# Patient Record
Sex: Male | Born: 1980 | Race: Black or African American | Hispanic: No | Marital: Single | Smoking: Current every day smoker
Health system: Southern US, Community
[De-identification: ages and names within clinical notes are randomized; demographics above are authoritative.]

## PROBLEM LIST (undated history)

## (undated) DIAGNOSIS — F32A Depression, unspecified: Secondary | ICD-10-CM

## (undated) DIAGNOSIS — F329 Major depressive disorder, single episode, unspecified: Secondary | ICD-10-CM

## (undated) DIAGNOSIS — S069XAA Unspecified intracranial injury with loss of consciousness status unknown, initial encounter: Secondary | ICD-10-CM

## (undated) DIAGNOSIS — R569 Unspecified convulsions: Secondary | ICD-10-CM

## (undated) DIAGNOSIS — F319 Bipolar disorder, unspecified: Secondary | ICD-10-CM

## (undated) DIAGNOSIS — S069X9A Unspecified intracranial injury with loss of consciousness of unspecified duration, initial encounter: Secondary | ICD-10-CM

## (undated) HISTORY — DX: Unspecified convulsions: R56.9

---

## 2013-08-11 ENCOUNTER — Encounter (HOSPITAL_COMMUNITY): Payer: Self-pay | Admitting: Radiology

## 2013-08-11 ENCOUNTER — Emergency Department (HOSPITAL_COMMUNITY): Payer: Medicaid Other

## 2013-08-11 ENCOUNTER — Inpatient Hospital Stay (HOSPITAL_COMMUNITY)
Admission: EM | Admit: 2013-08-11 | Discharge: 2013-09-13 | DRG: 003 | Disposition: A | Discharge status: Rehabilitation Facility | Payer: Medicaid Other | Attending: General Surgery | Admitting: General Surgery

## 2013-08-11 ENCOUNTER — Inpatient Hospital Stay (HOSPITAL_COMMUNITY): Payer: Medicaid Other

## 2013-08-11 DIAGNOSIS — S2239XA Fracture of one rib, unspecified side, initial encounter for closed fracture: Secondary | ICD-10-CM | POA: Diagnosis present

## 2013-08-11 DIAGNOSIS — S2249XA Multiple fractures of ribs, unspecified side, initial encounter for closed fracture: Secondary | ICD-10-CM | POA: Diagnosis present

## 2013-08-11 DIAGNOSIS — Z87820 Personal history of traumatic brain injury: Secondary | ICD-10-CM

## 2013-08-11 DIAGNOSIS — J154 Pneumonia due to other streptococci: Secondary | ICD-10-CM | POA: Diagnosis not present

## 2013-08-11 DIAGNOSIS — S270XXA Traumatic pneumothorax, initial encounter: Secondary | ICD-10-CM

## 2013-08-11 DIAGNOSIS — S2241XA Multiple fractures of ribs, right side, initial encounter for closed fracture: Secondary | ICD-10-CM

## 2013-08-11 DIAGNOSIS — E441 Mild protein-calorie malnutrition: Secondary | ICD-10-CM | POA: Diagnosis not present

## 2013-08-11 DIAGNOSIS — J189 Pneumonia, unspecified organism: Secondary | ICD-10-CM

## 2013-08-11 DIAGNOSIS — J939 Pneumothorax, unspecified: Secondary | ICD-10-CM

## 2013-08-11 DIAGNOSIS — S0230XA Fracture of orbital floor, unspecified side, initial encounter for closed fracture: Secondary | ICD-10-CM

## 2013-08-11 DIAGNOSIS — S02400A Malar fracture unspecified, initial encounter for closed fracture: Secondary | ICD-10-CM | POA: Diagnosis present

## 2013-08-11 DIAGNOSIS — S0292XA Unspecified fracture of facial bones, initial encounter for closed fracture: Secondary | ICD-10-CM

## 2013-08-11 DIAGNOSIS — S27329A Contusion of lung, unspecified, initial encounter: Secondary | ICD-10-CM

## 2013-08-11 DIAGNOSIS — S069X9A Unspecified intracranial injury with loss of consciousness of unspecified duration, initial encounter: Secondary | ICD-10-CM

## 2013-08-11 DIAGNOSIS — S065X9A Traumatic subdural hemorrhage with loss of consciousness of unspecified duration, initial encounter: Secondary | ICD-10-CM | POA: Diagnosis present

## 2013-08-11 DIAGNOSIS — J9600 Acute respiratory failure, unspecified whether with hypoxia or hypercapnia: Secondary | ICD-10-CM | POA: Diagnosis present

## 2013-08-11 DIAGNOSIS — D62 Acute posthemorrhagic anemia: Secondary | ICD-10-CM | POA: Diagnosis present

## 2013-08-11 DIAGNOSIS — L97409 Non-pressure chronic ulcer of unspecified heel and midfoot with unspecified severity: Secondary | ICD-10-CM | POA: Diagnosis not present

## 2013-08-11 DIAGNOSIS — D696 Thrombocytopenia, unspecified: Secondary | ICD-10-CM | POA: Diagnosis present

## 2013-08-11 DIAGNOSIS — E870 Hyperosmolality and hypernatremia: Secondary | ICD-10-CM | POA: Diagnosis not present

## 2013-08-11 DIAGNOSIS — IMO0002 Reserved for concepts with insufficient information to code with codable children: Secondary | ICD-10-CM | POA: Diagnosis present

## 2013-08-11 DIAGNOSIS — S42412K Displaced simple supracondylar fracture without intercondylar fracture of left humerus, subsequent encounter for fracture with nonunion: Secondary | ICD-10-CM | POA: Diagnosis present

## 2013-08-11 DIAGNOSIS — S02401A Maxillary fracture, unspecified, initial encounter for closed fracture: Secondary | ICD-10-CM | POA: Diagnosis present

## 2013-08-11 DIAGNOSIS — S0003XA Contusion of scalp, initial encounter: Secondary | ICD-10-CM | POA: Diagnosis present

## 2013-08-11 DIAGNOSIS — I7774 Dissection of vertebral artery: Secondary | ICD-10-CM | POA: Diagnosis present

## 2013-08-11 DIAGNOSIS — R4020 Unspecified coma: Secondary | ICD-10-CM | POA: Diagnosis present

## 2013-08-11 DIAGNOSIS — S0280XA Fracture of other specified skull and facial bones, unspecified side, initial encounter for closed fracture: Principal | ICD-10-CM | POA: Diagnosis present

## 2013-08-11 DIAGNOSIS — S065XAA Traumatic subdural hemorrhage with loss of consciousness status unknown, initial encounter: Principal | ICD-10-CM | POA: Diagnosis present

## 2013-08-11 DIAGNOSIS — J9690 Respiratory failure, unspecified, unspecified whether with hypoxia or hypercapnia: Secondary | ICD-10-CM

## 2013-08-11 DIAGNOSIS — R197 Diarrhea, unspecified: Secondary | ICD-10-CM | POA: Diagnosis not present

## 2013-08-11 DIAGNOSIS — I609 Nontraumatic subarachnoid hemorrhage, unspecified: Secondary | ICD-10-CM | POA: Diagnosis present

## 2013-08-11 DIAGNOSIS — J9819 Other pulmonary collapse: Secondary | ICD-10-CM | POA: Diagnosis not present

## 2013-08-11 DIAGNOSIS — S069XAA Unspecified intracranial injury with loss of consciousness status unknown, initial encounter: Secondary | ICD-10-CM

## 2013-08-11 DIAGNOSIS — I959 Hypotension, unspecified: Secondary | ICD-10-CM | POA: Diagnosis present

## 2013-08-11 DIAGNOSIS — F1210 Cannabis abuse, uncomplicated: Secondary | ICD-10-CM | POA: Diagnosis present

## 2013-08-11 DIAGNOSIS — S0990XA Unspecified injury of head, initial encounter: Secondary | ICD-10-CM

## 2013-08-11 DIAGNOSIS — F17200 Nicotine dependence, unspecified, uncomplicated: Secondary | ICD-10-CM | POA: Diagnosis present

## 2013-08-11 DIAGNOSIS — S82143A Displaced bicondylar fracture of unspecified tibia, initial encounter for closed fracture: Secondary | ICD-10-CM

## 2013-08-11 DIAGNOSIS — S82109A Unspecified fracture of upper end of unspecified tibia, initial encounter for closed fracture: Secondary | ICD-10-CM | POA: Diagnosis present

## 2013-08-11 DIAGNOSIS — S42309B Unspecified fracture of shaft of humerus, unspecified arm, initial encounter for open fracture: Secondary | ICD-10-CM | POA: Diagnosis present

## 2013-08-11 DIAGNOSIS — S42309A Unspecified fracture of shaft of humerus, unspecified arm, initial encounter for closed fracture: Secondary | ICD-10-CM

## 2013-08-11 DIAGNOSIS — E876 Hypokalemia: Secondary | ICD-10-CM | POA: Diagnosis not present

## 2013-08-11 DIAGNOSIS — N179 Acute kidney failure, unspecified: Secondary | ICD-10-CM | POA: Diagnosis not present

## 2013-08-11 HISTORY — DX: Depression, unspecified: F32.A

## 2013-08-11 HISTORY — DX: Major depressive disorder, single episode, unspecified: F32.9

## 2013-08-11 LAB — COMPREHENSIVE METABOLIC PANEL
Albumin: 3.9 g/dL (ref 3.5–5.2)
Alkaline Phosphatase: 65 U/L (ref 39–117)
BUN: 9 mg/dL (ref 6–23)
Calcium: 7.8 mg/dL — ABNORMAL LOW (ref 8.4–10.5)
Chloride: 105 mEq/L (ref 96–112)
GFR calc Af Amer: >90 mL/min (ref >90)
Glucose, Bld: 114 mg/dL — ABNORMAL HIGH (ref 70–99)
Potassium: 3.7 mEq/L (ref 3.5–5.1)
Total Bilirubin: 0.7 mg/dL (ref 0.3–1.2)
Total Protein: 7.3 g/dL (ref 6.0–8.3)

## 2013-08-11 LAB — CBC
HCT: 39.6 % (ref 39.0–52.0)
Hemoglobin: 14.5 g/dL (ref 13.0–17.0)
MCV: 88.4 fL (ref 78.0–100.0)
RBC: 4.48 MIL/uL (ref 4.22–5.81)
RDW: 13.1 % (ref 11.5–15.5)
WBC: 8.9 10*3/uL (ref 4.0–10.5)

## 2013-08-11 LAB — POCT I-STAT, CHEM 8
BUN: 8 mg/dL (ref 6–23)
Chloride: 102 mEq/L (ref 96–112)
Creatinine, Ser: 1.5 mg/dL — ABNORMAL HIGH (ref 0.50–1.35)
Potassium: 3.3 mEq/L — ABNORMAL LOW (ref 3.5–5.1)
Sodium: 143 mEq/L (ref 135–145)
TCO2: 24 mmol/L (ref 0–100)

## 2013-08-11 LAB — PROTIME-INR
INR: 1.11 (ref 0.00–1.49)
Prothrombin Time: 14.1 seconds (ref 11.6–15.2)

## 2013-08-11 LAB — POCT I-STAT 3, ART BLOOD GAS (G3+)
Bicarbonate: 19.4 mEq/L — ABNORMAL LOW (ref 20.0–24.0)
pCO2 arterial: 50.8 mmHg — ABNORMAL HIGH (ref 35.0–45.0)
pH, Arterial: 7.189 — CL (ref 7.350–7.450)
pO2, Arterial: 263 mmHg — ABNORMAL HIGH (ref 80.0–100.0)

## 2013-08-11 LAB — ABO/RH: ABO/RH(D): A POS

## 2013-08-11 LAB — CG4 I-STAT (LACTIC ACID): Lactic Acid, Venous: 2.7 mmol/L — ABNORMAL HIGH (ref 0.5–2.2)

## 2013-08-11 MED ORDER — MIDAZOLAM HCL 2 MG/2ML IJ SOLN
5.0000 mg | Freq: Once | INTRAMUSCULAR | Status: AC
  Administered 2013-08-11: 5 mg via INTRAVENOUS

## 2013-08-11 MED ORDER — ETOMIDATE 2 MG/ML IV SOLN
0.3000 mg/kg | Freq: Once | INTRAVENOUS | Status: AC
  Administered 2013-08-11: 20 mg via INTRAVENOUS

## 2013-08-11 MED ORDER — FENTANYL CITRATE 0.05 MG/ML IJ SOLN
100.0000 ug | Freq: Once | INTRAMUSCULAR | Status: AC
  Administered 2013-08-11: 100 ug via INTRAVENOUS

## 2013-08-11 MED ORDER — MIDAZOLAM HCL 2 MG/2ML IJ SOLN
2.0000 mg | Freq: Once | INTRAMUSCULAR | Status: AC
  Administered 2013-08-11: 2 mg via INTRAVENOUS

## 2013-08-11 MED ORDER — SUCCINYLCHOLINE CHLORIDE 20 MG/ML IJ SOLN
100.0000 mg | Freq: Once | INTRAMUSCULAR | Status: AC
  Administered 2013-08-11: 100 mg via INTRAVENOUS

## 2013-08-11 MED ORDER — MIDAZOLAM HCL 2 MG/2ML IJ SOLN
INTRAMUSCULAR | Status: AC
  Administered 2013-08-11: 2 mg via INTRAVENOUS
  Filled 2013-08-11: qty 2

## 2013-08-11 MED ORDER — CEFAZOLIN SODIUM-DEXTROSE 2-3 GM-% IV SOLR
INTRAVENOUS | Status: AC
  Administered 2013-08-11: 2 g via INTRAVENOUS
  Filled 2013-08-11: qty 50

## 2013-08-11 MED ORDER — CEFAZOLIN SODIUM-DEXTROSE 2-3 GM-% IV SOLR
2.0000 g | Freq: Once | INTRAVENOUS | Status: AC
  Administered 2013-08-11: 2 g via INTRAVENOUS

## 2013-08-11 MED ORDER — TETANUS-DIPHTH-ACELL PERTUSSIS 5-2.5-18.5 LF-MCG/0.5 IM SUSP
0.5000 mL | Freq: Once | INTRAMUSCULAR | Status: AC
  Administered 2013-08-11: 0.5 mL via INTRAMUSCULAR

## 2013-08-11 MED ORDER — IOHEXOL 300 MG/ML  SOLN
100.0000 mL | Freq: Once | INTRAMUSCULAR | Status: AC | PRN
  Administered 2013-08-11: 100 mL via INTRAVENOUS

## 2013-08-11 MED ORDER — SODIUM CHLORIDE 0.9 % IV SOLN
25.0000 ug/h | INTRAVENOUS | Status: DC
  Administered 2013-08-11: 50 ug/h via INTRAVENOUS
  Administered 2013-08-12 – 2013-08-13 (×2): 100 ug/h via INTRAVENOUS
  Administered 2013-08-14: 120 ug/h via INTRAVENOUS
  Administered 2013-08-15: 200 ug/h via INTRAVENOUS
  Administered 2013-08-15: 150 ug/h via INTRAVENOUS
  Administered 2013-08-16: 275 ug/h via INTRAVENOUS
  Administered 2013-08-16: 150 ug/h via INTRAVENOUS
  Administered 2013-08-17: 200 ug/h via INTRAVENOUS
  Administered 2013-08-17: 275 ug/h via INTRAVENOUS
  Administered 2013-08-18 – 2013-08-22 (×6): 100 ug/h via INTRAVENOUS
  Administered 2013-08-24: 25 ug/h via INTRAVENOUS
  Filled 2013-08-11 (×16): qty 50

## 2013-08-11 MED ORDER — FENTANYL CITRATE 0.05 MG/ML IJ SOLN
INTRAMUSCULAR | Status: AC
  Administered 2013-08-11: 100 ug via INTRAVENOUS
  Filled 2013-08-11: qty 2

## 2013-08-11 MED ORDER — MIDAZOLAM BOLUS VIA INFUSION
1.0000 mg | INTRAVENOUS | Status: DC | PRN
  Filled 2013-08-11: qty 2

## 2013-08-11 MED ORDER — TETANUS-DIPHTH-ACELL PERTUSSIS 5-2.5-18.5 LF-MCG/0.5 IM SUSP
INTRAMUSCULAR | Status: AC
  Administered 2013-08-11: 0.5 mL via INTRAMUSCULAR
  Filled 2013-08-11: qty 0.5

## 2013-08-11 MED ORDER — MIDAZOLAM HCL 5 MG/ML IJ SOLN
1.0000 mg/h | INTRAMUSCULAR | Status: DC
  Administered 2013-08-11: 1 mg/h via INTRAVENOUS
  Administered 2013-08-12: 3 mg/h via INTRAVENOUS
  Administered 2013-08-13: 2 mg/h via INTRAVENOUS
  Administered 2013-08-14: 1 mg/h via INTRAVENOUS
  Administered 2013-08-15: 2 mg/h via INTRAVENOUS
  Administered 2013-08-16: 1 mg/h via INTRAVENOUS
  Administered 2013-08-16: 4 mg/h via INTRAVENOUS
  Administered 2013-08-17: 2 mg/h via INTRAVENOUS
  Administered 2013-08-19 – 2013-08-20 (×2): 1 mg/h via INTRAVENOUS
  Filled 2013-08-11 (×9): qty 10

## 2013-08-11 MED ORDER — IOHEXOL 350 MG/ML SOLN
60.0000 mL | Freq: Once | INTRAVENOUS | Status: AC | PRN
  Administered 2013-08-11: 60 mL via INTRAVENOUS

## 2013-08-11 MED ORDER — FENTANYL CITRATE 0.05 MG/ML IJ SOLN: 50.0000 ug | Freq: Once | INTRAMUSCULAR | Status: DC

## 2013-08-11 MED ORDER — FENTANYL BOLUS VIA INFUSION
25.0000 ug | Freq: Four times a day (QID) | INTRAVENOUS | Status: DC | PRN
  Administered 2013-08-12: 100 mg via INTRAVENOUS
  Administered 2013-08-13 (×2): 50 mg via INTRAVENOUS
  Filled 2013-08-11: qty 100
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
## 2013-08-11 NOTE — ED Notes (Signed)
Dr. Magnus Ivan placing CT R side.

## 2013-08-11 NOTE — ED Notes (Signed)
Succ 100mg  IVP

## 2013-08-11 NOTE — ED Notes (Signed)
Xray paged for portable cxr humerus and elbow.

## 2013-08-11 NOTE — ED Notes (Signed)
Dr. Littie Deeds into room, breathing well with vent, VSS, RT present.

## 2013-08-11 NOTE — ED Notes (Signed)
VSS, pending IV, RSI meds and intubation, first etomidate infiltrated. VSS, bagged with BVM.
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
## 2013-08-11 NOTE — Progress Notes (Signed)
Orthopedic Tech Progress Note Patient Details:  Cory [name] 30-Aug-1981 [id number]  Patient ID: [name], male   DOB: 1981-03-30, 32 y.o.   MRN: [id number] As ordered by Dr. Jeral Pinch, Mikaele Stecher 08/11/2013, 10:29 PM

## 2013-08-11 NOTE — ED Notes (Signed)
R PTX noted, preparing for chest tube.

## 2013-08-11 NOTE — ED Notes (Addendum)
Chest tube replaced by Dr. Magnus Ivan

## 2013-08-11 NOTE — ED Notes (Signed)
To CT 1

## 2013-08-11 NOTE — ED Notes (Addendum)
orthotech at [facility] splinting R arm, pending ortho and nsurg MD arrivals.

## 2013-08-11 NOTE — ED Notes (Signed)
Dr. Magnus Ivan attempting L femoral central line.

## 2013-08-11 NOTE — ED Notes (Addendum)
Labs reviewed, versed 5mg  given, OGT in place.

## 2013-08-11 NOTE — ED Notes (Signed)
NS 2500 infused

## 2013-08-11 NOTE — ED Notes (Signed)
NS 500cc infused.
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
## 2013-08-11 NOTE — ED Notes (Signed)
EMS IV decided good, site unremarkable, IVF w.o. to gravity to central line and PIV.

## 2013-08-11 NOTE — ED Notes (Signed)
CT tube placed, remains on LSB, calm, NAD.

## 2013-08-11 NOTE — ED Notes (Signed)
VS now Q10, Dr. Magnus Ivan into room, orders received, Dr Magnus Ivan out to speak with family, new orders placed for arteriogram bilateral carotids. No changes. Pain and sedation meds given. Dr. Luiz Blare into room.

## 2013-08-11 NOTE — ED Notes (Signed)
LR infusing to central and PIV.

## 2013-08-11 NOTE — ED Notes (Signed)
No changes, continuing scans, Dr. Magnus Ivan present in CT scanner room, VSS. 1 L NS infused to R AC.
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
## 2013-08-11 NOTE — ED Notes (Signed)
Assist in various procedures to maintain patient comfort care.

## 2013-08-11 NOTE — ED Notes (Signed)
Urine sent

## 2013-08-11 NOTE — ED Notes (Signed)
No changes, calm, NAD, biting down on ETT, pain and sedation meds increased.

## 2013-08-11 NOTE — ED Notes (Signed)
cxr done

## 2013-08-11 NOTE — ED Notes (Signed)
RT at [facility], preparing to go to CT.

## 2013-08-11 NOTE — ED Notes (Signed)
Bagged biting tube, Dr. Littie Deeds at [facility], preparing to switch out king airway with ETT.

## 2013-08-11 NOTE — ED Notes (Signed)
Arm splinted, Dr. Luiz Blare (ortho) at [facility], doppler at [facility].

## 2013-08-11 NOTE — ED Notes (Signed)
Preparing RSI meds, continued to be bagged, Drs. Celedonio Miyamoto and [place], and Trauma Dr. Magnus Ivan at [facility].

## 2013-08-11 NOTE — ED Notes (Signed)
To CT scanner 2.

## 2013-08-11 NOTE — ED Notes (Signed)
Room temp increased.
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
## 2013-08-11 NOTE — ED Notes (Signed)
R 18g IV by EMS infiltrated.

## 2013-08-11 NOTE — ED Notes (Signed)
Etomidate  20 mg IVP

## 2013-08-11 NOTE — ED Notes (Signed)
Bolus given: versed 2mg , fentanyl via pump prior to CT, bite block(airway placed to protect ETT).

## 2013-08-11 NOTE — ED Notes (Signed)
orthotech continuing to place R arm splint, 4th liter LR infused, 5th liter warm NS hung and infusing at 125/hr. bair hugger in place.

## 2013-08-11 NOTE — ED Notes (Signed)
Dr. Magnus Ivan into room, preparing for replacement of R chest chest tube.

## 2013-08-11 NOTE — ED Notes (Signed)
Central line placed L femoral vein.
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
## 2013-08-11 NOTE — ED Notes (Signed)
Xray continuing arm films, no changes, VSS.

## 2013-08-11 NOTE — ED Notes (Signed)
CT complete
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
## 2013-08-11 NOTE — ED Notes (Signed)
Placing foley

## 2013-08-11 NOTE — ED Notes (Signed)
Versed 5 mg given
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
## 2013-08-12 ENCOUNTER — Encounter (HOSPITAL_COMMUNITY): Admission: EM | Disposition: A | Discharge status: Rehabilitation Facility | Payer: Self-pay | Source: Home / Self Care

## 2013-08-12 ENCOUNTER — Inpatient Hospital Stay (HOSPITAL_COMMUNITY): Payer: Medicaid Other

## 2013-08-12 DIAGNOSIS — S27329A Contusion of lung, unspecified, initial encounter: Secondary | ICD-10-CM

## 2013-08-12 LAB — URINALYSIS, ROUTINE W REFLEX MICROSCOPIC
Bilirubin Urine: NEGATIVE
Glucose, UA: NEGATIVE mg/dL
Glucose, UA: NEGATIVE mg/dL
Ketones, ur: NEGATIVE mg/dL
Leukocytes, UA: NEGATIVE
Nitrite: NEGATIVE
Nitrite: NEGATIVE
Protein, ur: NEGATIVE mg/dL
Protein, ur: NEGATIVE mg/dL
Specific Gravity, Urine: 1.029 (ref 1.005–1.030)
Urobilinogen, UA: 0.2 mg/dL (ref 0.0–1.0)
pH: 5.5 (ref 5.0–8.0)

## 2013-08-12 LAB — TYPE AND SCREEN
Unit division: 0
Unit division: 0

## 2013-08-12 LAB — BASIC METABOLIC PANEL
BUN: 7 mg/dL (ref 6–23)
CO2: 19 mEq/L (ref 19–32)
Calcium: 6.3 mg/dL — CL (ref 8.4–10.5)
Chloride: 113 mEq/L — ABNORMAL HIGH (ref 96–112)
Creatinine, Ser: 0.91 mg/dL (ref 0.50–1.35)
Glucose, Bld: 84 mg/dL (ref 70–99)
Sodium: 145 mEq/L (ref 135–145)

## 2013-08-12 LAB — CBC
MCH: 31.4 pg (ref 26.0–34.0)
MCHC: 35.4 g/dL (ref 30.0–36.0)
MCV: 89 fL (ref 78.0–100.0)
Platelets: 93 10*3/uL — ABNORMAL LOW (ref 150–400)
RBC: 3.53 MIL/uL — ABNORMAL LOW (ref 4.22–5.81)
RDW: 13.4 % (ref 11.5–15.5)

## 2013-08-12 LAB — MRSA PCR SCREENING: MRSA by PCR: NEGATIVE

## 2013-08-12 LAB — POCT I-STAT 3, ART BLOOD GAS (G3+)
Acid-base deficit: 5 mmol/L — ABNORMAL HIGH (ref 0.0–2.0)
Acid-base deficit: 7 mmol/L — ABNORMAL HIGH (ref 0.0–2.0)
Bicarbonate: 17.8 meq/L — ABNORMAL LOW (ref 20.0–24.0)
O2 Saturation: 98 %
O2 Saturation: 98 %
Patient temperature: 98.7
Patient temperature: 103
pO2, Arterial: 106 mmHg — ABNORMAL HIGH (ref 80.0–100.0)
pO2, Arterial: 117 mmHg — ABNORMAL HIGH (ref 80.0–100.0)

## 2013-08-12 LAB — GLUCOSE, CAPILLARY: Glucose-Capillary: 90 mg/dL (ref 70–99)

## 2013-08-12 LAB — URINE MICROSCOPIC-ADD ON

## 2013-08-12 SURGERY — CANCELLED PROCEDURE
Laterality: Left

## 2013-08-12 MED ORDER — MIDAZOLAM HCL 2 MG/2ML IJ SOLN: 2.0000 mg | INTRAMUSCULAR | Status: DC | PRN

## 2013-08-12 MED ORDER — SODIUM CHLORIDE 0.9 % IV BOLUS (SEPSIS)
1000.0000 mL | Freq: Once | INTRAVENOUS | Status: AC
  Administered 2013-08-12: 1000 mL via INTRAVENOUS

## 2013-08-12 MED ORDER — PANTOPRAZOLE SODIUM 40 MG IV SOLR
40.0000 mg | Freq: Every day | INTRAVENOUS | Status: DC
  Administered 2013-08-12 – 2013-08-17 (×6): 40 mg via INTRAVENOUS
  Filled 2013-08-12 (×11): qty 40

## 2013-08-12 MED ORDER — FENTANYL CITRATE 0.05 MG/ML IJ SOLN
50.0000 ug | INTRAMUSCULAR | Status: DC | PRN
  Administered 2013-08-24: 100 ug via INTRAVENOUS
  Filled 2013-08-12: qty 2

## 2013-08-12 MED ORDER — ONDANSETRON HCL 4 MG PO TABS: 4.0000 mg | ORAL_TABLET | Freq: Four times a day (QID) | ORAL | Status: DC | PRN

## 2013-08-12 MED ORDER — SODIUM CHLORIDE 0.9 % IV SOLN
2.0000 g | Freq: Once | INTRAVENOUS | Status: AC
  Administered 2013-08-12: 2 g via INTRAVENOUS
  Filled 2013-08-12: qty 20

## 2013-08-12 MED ORDER — CHLORHEXIDINE GLUCONATE 0.12 % MT SOLN: 15.0000 mL | Freq: Two times a day (BID) | OROMUCOSAL | Status: DC

## 2013-08-12 MED ORDER — CEFAZOLIN SODIUM-DEXTROSE 2-3 GM-% IV SOLR
2.0000 g | Freq: Three times a day (TID) | INTRAVENOUS | Status: DC
  Administered 2013-08-12 – 2013-08-13 (×5): 2 g via INTRAVENOUS
  Filled 2013-08-12 (×8): qty 50

## 2013-08-12 MED ORDER — CHLORHEXIDINE GLUCONATE 0.12 % MT SOLN
15.0000 mL | Freq: Two times a day (BID) | OROMUCOSAL | Status: DC
  Administered 2013-08-12 – 2013-09-13 (×64): 15 mL via OROMUCOSAL
  Filled 2013-08-12 (×69): qty 15

## 2013-08-12 MED ORDER — MANNITOL 25 % IV SOLN
25.0000 g | Freq: Once | INTRAVENOUS | Status: AC
  Administered 2013-08-12: 25 g via INTRAVENOUS
  Filled 2013-08-12: qty 100

## 2013-08-12 MED ORDER — ACETAMINOPHEN 650 MG RE SUPP
650.0000 mg | RECTAL | Status: DC | PRN
  Administered 2013-08-12 (×3): 650 mg via RECTAL
  Filled 2013-08-12 (×3): qty 1

## 2013-08-12 MED ORDER — BIOTENE DRY MOUTH MT LIQD
15.0000 mL | Freq: Four times a day (QID) | OROMUCOSAL | Status: DC
  Administered 2013-08-12 – 2013-09-13 (×119): 15 mL via OROMUCOSAL

## 2013-08-12 MED ORDER — PANTOPRAZOLE SODIUM 40 MG PO TBEC: 40.0000 mg | DELAYED_RELEASE_TABLET | Freq: Every day | ORAL | Status: DC

## 2013-08-12 MED ORDER — POTASSIUM CHLORIDE IN NACL 20-0.9 MEQ/L-% IV SOLN
INTRAVENOUS | Status: DC
  Administered 2013-08-12 (×3): via INTRAVENOUS
  Administered 2013-08-13: 1000 mL via INTRAVENOUS
  Administered 2013-08-13: 03:00:00 via INTRAVENOUS
  Administered 2013-08-14: 1000 mL via INTRAVENOUS
  Filled 2013-08-12 (×12): qty 1000

## 2013-08-12 MED ORDER — ONDANSETRON HCL 4 MG/2ML IJ SOLN: 4.0000 mg | Freq: Four times a day (QID) | INTRAMUSCULAR | Status: DC | PRN

## 2013-08-12 MED ORDER — BIOTENE DRY MOUTH MT LIQD: 15.0000 mL | Freq: Four times a day (QID) | OROMUCOSAL | Status: DC

## 2013-08-12 MED ORDER — MIDAZOLAM HCL 2 MG/2ML IJ SOLN: 2.0000 mg | Freq: Once | INTRAMUSCULAR | Status: DC

## 2013-08-12 MED ORDER — POVIDONE-IODINE 10 % EX SOLN
Freq: Once | CUTANEOUS | Status: DC
  Filled 2013-08-12: qty 118

## 2013-08-12 SURGICAL SUPPLY — 46 items
BANDAGE ELASTIC 3 VELCRO ST LF (GAUZE/BANDAGES/DRESSINGS) IMPLANT
BANDAGE ELASTIC 4 VELCRO ST LF (GAUZE/BANDAGES/DRESSINGS) IMPLANT
BANDAGE GAUZE ELAST BULKY 4 IN (GAUZE/BANDAGES/DRESSINGS) IMPLANT
BNDG COHESIVE 4X5 TAN STRL (GAUZE/BANDAGES/DRESSINGS) ×3 IMPLANT
BNDG ESMARK 4X9 LF (GAUZE/BANDAGES/DRESSINGS) ×3 IMPLANT
CLOTH BEACON ORANGE TIMEOUT ST (SAFETY) ×3 IMPLANT
CORDS BIPOLAR (ELECTRODE) ×3 IMPLANT
COVER MAYO STAND STRL (DRAPES) ×3 IMPLANT
COVER SURGICAL LIGHT HANDLE (MISCELLANEOUS) ×3 IMPLANT
CUFF TOURNIQUET SINGLE 18IN (TOURNIQUET CUFF) ×3 IMPLANT
CUFF TOURNIQUET SINGLE 24IN (TOURNIQUET CUFF) IMPLANT
DRAPE INCISE IOBAN 66X45 STRL (DRAPES) ×3 IMPLANT
DRAPE OEC MINIVIEW 54X84 (DRAPES) IMPLANT
GAUZE XEROFORM 1X8 LF (GAUZE/BANDAGES/DRESSINGS) IMPLANT
GLOVE BIOGEL PI IND STRL 8 (GLOVE) ×4 IMPLANT
GLOVE BIOGEL PI INDICATOR 8 (GLOVE) ×2
GLOVE ECLIPSE 7.5 STRL STRAW (GLOVE) ×6 IMPLANT
GOWN PREVENTION PLUS XLARGE (GOWN DISPOSABLE) ×3 IMPLANT
GOWN SRG XL XLNG 56XLVL 4 (GOWN DISPOSABLE) ×2 IMPLANT
GOWN STRL NON-REIN LRG LVL3 (GOWN DISPOSABLE) ×3 IMPLANT
GOWN STRL NON-REIN XL XLG LVL4 (GOWN DISPOSABLE) ×1
KIT BASIN OR (CUSTOM PROCEDURE TRAY) ×3 IMPLANT
KIT ROOM TURNOVER OR (KITS) ×3 IMPLANT
LOOP VESSEL MAXI BLUE (MISCELLANEOUS) IMPLANT
MANIFOLD NEPTUNE II (INSTRUMENTS) ×3 IMPLANT
NEEDLE HYPO 25GX1X1/2 BEV (NEEDLE) IMPLANT
NS IRRIG 1000ML POUR BTL (IV SOLUTION) ×3 IMPLANT
PACK ORTHO EXTREMITY (CUSTOM PROCEDURE TRAY) ×3 IMPLANT
PAD ARMBOARD 7.5X6 YLW CONV (MISCELLANEOUS) ×6 IMPLANT
PAD CAST 4YDX4 CTTN HI CHSV (CAST SUPPLIES) IMPLANT
PADDING CAST COTTON 4X4 STRL (CAST SUPPLIES)
SOLUTION BETADINE 4OZ (MISCELLANEOUS) ×3 IMPLANT
SPECIMEN JAR SMALL (MISCELLANEOUS) ×3 IMPLANT
SPONGE GAUZE 4X4 12PLY (GAUZE/BANDAGES/DRESSINGS) IMPLANT
SPONGE SCRUB IODOPHOR (GAUZE/BANDAGES/DRESSINGS) ×3 IMPLANT
SUCTION FRAZIER TIP 10 FR DISP (SUCTIONS) IMPLANT
SUT MERSILENE 4 0 P 3 (SUTURE) IMPLANT
SUT PROLENE 4 0 PS 2 18 (SUTURE) IMPLANT
SUT VIC AB 2-0 CT1 27 (SUTURE)
SUT VIC AB 2-0 CT1 TAPERPNT 27 (SUTURE) IMPLANT
SYR CONTROL 10ML LL (SYRINGE) IMPLANT
TOWEL OR 17X24 6PK STRL BLUE (TOWEL DISPOSABLE) ×3 IMPLANT
TOWEL OR 17X26 10 PK STRL BLUE (TOWEL DISPOSABLE) ×6 IMPLANT
TUBE CONNECTING 12X1/4 (SUCTIONS) IMPLANT
UNDERPAD 30X30 INCONTINENT (UNDERPADS AND DIAPERS) ×3 IMPLANT
WATER STERILE IRR 1000ML POUR (IV SOLUTION) ×3 IMPLANT
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
## 2013-08-12 NOTE — Progress Notes (Signed)
Chaplain paged to meet family in ED in consultation room B.   Mother: Helmut Muster Brother: Debby Bud Brother: Apolinar Junes  Pt's mother expressed being very anxious. Chaplain gathered contact information of pt and family for registration. Pt's mother said that pt has been homeless and living at [facility]. Chaplain returned to check on family and found they had left the hospital.   Chaplain offered emotional support, empathic listening, and acted as Print production planner with medical team.   Maurene Capes, Chaplain [phone]

## 2013-08-12 NOTE — Progress Notes (Signed)
CRITICAL VALUE ALERT  Critical value received: Calcium 6.3   Date of notification:  08/12/13  Time of notification:  0540  Critical value read back:yes  Nurse who received alert:  Bronson Curb  MD notified (1st page):  Magnus Ivan  Time of first page:  0625  MD notified (2nd page):  Time of second page:  Responding MD:  Magnus Ivan  Time MD responded:  [phone]

## 2013-08-12 NOTE — Progress Notes (Addendum)
Bronson Curb RN, Arnoldo Lenis RN, RT and Transporter transferred patient to CT and to new unit assignment . Pt. Noted to have minimal amount bleeding from left ear and left nare during CT scan. Vital signs stable, pt does not appear to be in distress. Pt. Transported to without event. Pt. Transferred onto new unit's telemetry. RN made aware of bleed in CT. This RN will now assume care. Ignacia Palma RN

## 2013-08-12 NOTE — Progress Notes (Signed)
Pt. Belongings sent home with mother. Clothes, wallet, bible.

## 2013-08-12 NOTE — Op Note (Signed)
08/12/2013  2:43 AM  PATIENT:  Cory [name]  32 y.o. male  PRE-OPERATIVE DIAGNOSIS: Severe head injury  POST-OPERATIVE DIAGNOSIS: Severe head injury  PROCEDURE:  Procedure(s):  Insertion of right frontal Camino bolt intracranial pressure monitor  SURGEON:  Hewitt Shorts, M.D.  ANESTHESIA:   local  DICTATION: At the patient's bedside in the medical intensive care unit, the right frontal scalp was shaved, and then prepped with Betadine solution and draped in a sterile fashion. A 5 mm incision was made in the right midpupillary line, just anterior to the coronal suture. A twist roll hole was made in the skull and the Camino bolt was secured to the skull. The dura was punctured. The transducer catheter was passed into the right frontal lobe. Opening pressure was 31 mmHg.  A dressing was applied.  Patient was positioned today upright position, he was ordered 25 g of mannitol, and a request for an ABG was made.

## 2013-08-12 NOTE — Progress Notes (Signed)
Patient ID: Cory [name], male   DOB: 20-Apr-1981, 32 y.o.   MRN: [id number] I spoke with Dr. Jule Ser who believes the patient may be ready for surgery tomorrow if his intracranial pressures remained stable through the night.  I will plan on putting him on the schedule for tomorrow in anticipation of his pressures being reasonable.  If we are unable take him to the operating room and we will plan for delayed time to taken to the operating room.  The surgery will be somewhere between 10:00 and 4 PM based on the schedule of the operating room.  I spoke with his mother tonight and she agrees that the surgery should be performed.  She plans on coming to the hospital to sign the consent form tomorrow and I told her what time she may need to be there.  If she has not made it to the hospital and time to sign the consent form she certainly has been apprised of the problem at length.  She may be reached at [phone] if I can set needs to be done over the phone.  I will evaluate him in the morning and plan surgery based on the recommendation of Dr. Jule Ser.
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
## 2013-08-12 NOTE — Procedures (Signed)
Arterial Catheter Insertion Procedure Note Cory [name] [id number] Dec 18, 1980  Procedure: Insertion of Arterial Catheter  Indications: Frequent blood sampling  Procedure Details Consent: Unable to obtain consent because of emergent medical necessity. Time Out: Verified patient identification, verified procedure, site/side was marked, verified correct patient position, special equipment/implants available, medications/allergies/relevent history reviewed, required imaging and test results available.  Performed  Maximum sterile technique was used including antiseptics, gloves, gown, hand hygiene, mask and sheet. Skin prep: Chlorhexidine; local anesthetic administered 20 gauge catheter was inserted into right radial artery using the Seldinger technique.  Evaluation Blood flow good; BP tracing good. Complications: No apparent complications.   Kayden Hutmacher, Thelma Barge 08/12/2013

## 2013-08-13 ENCOUNTER — Encounter (HOSPITAL_COMMUNITY): Payer: Medicaid Other | Admitting: Anesthesiology

## 2013-08-13 ENCOUNTER — Inpatient Hospital Stay (HOSPITAL_COMMUNITY): Payer: Medicaid Other

## 2013-08-13 ENCOUNTER — Encounter (HOSPITAL_COMMUNITY): Admission: EM | Disposition: A | Discharge status: Rehabilitation Facility | Payer: Self-pay | Source: Home / Self Care

## 2013-08-13 ENCOUNTER — Inpatient Hospital Stay (HOSPITAL_COMMUNITY): Payer: Medicaid Other | Admitting: Anesthesiology

## 2013-08-13 ENCOUNTER — Encounter (HOSPITAL_COMMUNITY): Payer: Self-pay | Admitting: *Deleted

## 2013-08-13 HISTORY — PX: ORIF HUMERUS FRACTURE: SHX2126

## 2013-08-13 HISTORY — PX: FRACTURE SURGERY: SHX138

## 2013-08-13 HISTORY — PX: ORIF TIBIA PLATEAU: SHX2132

## 2013-08-13 SURGERY — OPEN REDUCTION INTERNAL FIXATION (ORIF) HUMERAL SHAFT FRACTURE
Anesthesia: General | Site: Leg Lower | Laterality: Left | Wound class: Clean

## 2013-08-13 MED ORDER — CEFAZOLIN SODIUM-DEXTROSE 2-3 GM-% IV SOLR
INTRAVENOUS | Status: DC | PRN
  Administered 2013-08-13: 2 g via INTRAVENOUS

## 2013-08-13 MED ORDER — SODIUM CHLORIDE 0.9 % IV SOLN
INTRAVENOUS | Status: DC | PRN
  Administered 2013-08-13: 15:00:00 via INTRAVENOUS

## 2013-08-13 MED ORDER — PHENYLEPHRINE HCL 10 MG/ML IJ SOLN
10.0000 mg | INTRAVENOUS | Status: DC | PRN
  Administered 2013-08-13: 10 ug/min via INTRAVENOUS

## 2013-08-13 MED ORDER — DEXAMETHASONE SODIUM PHOSPHATE 4 MG/ML IJ SOLN
INTRAMUSCULAR | Status: DC | PRN
  Administered 2013-08-13: 8 mg via INTRAVENOUS

## 2013-08-13 MED ORDER — MIDAZOLAM HCL 5 MG/5ML IJ SOLN
INTRAMUSCULAR | Status: DC | PRN
  Administered 2013-08-13: 4 mg via INTRAVENOUS
  Administered 2013-08-13: 2 mg via INTRAVENOUS

## 2013-08-13 MED ORDER — ONDANSETRON HCL 4 MG/2ML IJ SOLN
INTRAMUSCULAR | Status: DC | PRN
  Administered 2013-08-13: 4 mg via INTRAMUSCULAR

## 2013-08-13 MED ORDER — VECURONIUM BROMIDE 10 MG IV SOLR
INTRAVENOUS | Status: DC | PRN
  Administered 2013-08-13 (×2): 10 mg via INTRAVENOUS

## 2013-08-13 MED ORDER — ROCURONIUM BROMIDE 100 MG/10ML IV SOLN
INTRAVENOUS | Status: DC | PRN
  Administered 2013-08-13: 50 mg via INTRAVENOUS

## 2013-08-13 MED ORDER — DEXTROSE 5 % IV SOLN
INTRAVENOUS | Status: DC | PRN
  Administered 2013-08-13: 18:00:00 via INTRAVENOUS

## 2013-08-13 MED ORDER — LACTATED RINGERS IV SOLN
INTRAVENOUS | Status: DC | PRN
  Administered 2013-08-13: 15:00:00 via INTRAVENOUS

## 2013-08-13 MED ORDER — FENTANYL CITRATE 0.05 MG/ML IJ SOLN
INTRAMUSCULAR | Status: DC | PRN
  Administered 2013-08-13 (×2): 100 ug via INTRAVENOUS
  Administered 2013-08-13 (×2): 250 ug via INTRAVENOUS
  Administered 2013-08-13: 150 ug via INTRAVENOUS
  Administered 2013-08-13: 250 ug via INTRAVENOUS

## 2013-08-13 MED ORDER — CEFAZOLIN SODIUM-DEXTROSE 2-3 GM-% IV SOLR
2.0000 g | Freq: Three times a day (TID) | INTRAVENOUS | Status: AC
  Administered 2013-08-13 – 2013-08-15 (×6): 2 g via INTRAVENOUS
  Filled 2013-08-13 (×6): qty 50

## 2013-08-13 MED ORDER — 0.9 % SODIUM CHLORIDE (POUR BTL) OPTIME
TOPICAL | Status: DC | PRN
  Administered 2013-08-13: 1000 mL

## 2013-08-13 MED ORDER — CEFAZOLIN SODIUM-DEXTROSE 2-3 GM-% IV SOLR
INTRAVENOUS | Status: AC
  Filled 2013-08-13: qty 50

## 2013-08-13 SURGICAL SUPPLY — 97 items
BANDAGE ELASTIC 3 VELCRO ST LF (GAUZE/BANDAGES/DRESSINGS) ×3 IMPLANT
BANDAGE ELASTIC 4 VELCRO ST LF (GAUZE/BANDAGES/DRESSINGS) ×3 IMPLANT
BANDAGE ELASTIC 6 VELCRO ST LF (GAUZE/BANDAGES/DRESSINGS) ×3 IMPLANT
BANDAGE GAUZE ELAST BULKY 4 IN (GAUZE/BANDAGES/DRESSINGS) ×3 IMPLANT
BIT DRILL 1.1 (BIT) ×1
BIT DRILL 1.5X30 QC DISP (BIT) ×3 IMPLANT
BIT DRILL 100X2.5XANTM LCK (BIT) ×4 IMPLANT
BIT DRILL 3.2 QC DISP (BIT) ×3 IMPLANT
BIT DRILL 3.8X127 CALB (DRILL) ×3 IMPLANT
BIT DRILL 4.5 (BIT) ×3 IMPLANT
BIT DRILL 60X20X1.1XQC TMX (BIT) ×2 IMPLANT
BIT DRILL CAL (BIT) ×2 IMPLANT
BIT DRL 100X2.5XANTM LCK (BIT) ×4
BIT DRL 60X20X1.1XQC TMX (BIT) ×2
BLADE SURG ROTATE 9660 (MISCELLANEOUS) ×3 IMPLANT
BNDG ESMARK 4X9 LF (GAUZE/BANDAGES/DRESSINGS) ×3 IMPLANT
CLOTH BEACON ORANGE TIMEOUT ST (SAFETY) IMPLANT
COVER SURGICAL LIGHT HANDLE (MISCELLANEOUS) ×3 IMPLANT
CUFF TOURNIQUET SINGLE 18IN (TOURNIQUET CUFF) ×3 IMPLANT
CUFF TOURNIQUET SINGLE 24IN (TOURNIQUET CUFF) IMPLANT
DRAPE C-ARM 42X72 X-RAY (DRAPES) ×3 IMPLANT
DRAPE C-ARMOR (DRAPES) ×3 IMPLANT
DRAPE OEC MINIVIEW 54X84 (DRAPES) IMPLANT
DRAPE U-SHAPE 47X51 STRL (DRAPES) ×12 IMPLANT
DRILL BIT 2.5MM (BIT) ×2
DRILL BIT CAL (BIT) ×3
DRIVER BIT 1.5 (TRAUMA) ×3 IMPLANT
DRSG PAD ABDOMINAL 8X10 ST (GAUZE/BANDAGES/DRESSINGS) ×6 IMPLANT
ELECT REM PT RETURN 9FT ADLT (ELECTROSURGICAL) ×3
ELECTRODE REM PT RTRN 9FT ADLT (ELECTROSURGICAL) ×2 IMPLANT
GAUZE XEROFORM 5X9 LF (GAUZE/BANDAGES/DRESSINGS) ×6 IMPLANT
GLOVE BIO SURGEON STRL SZ7.5 (GLOVE) ×12 IMPLANT
GLOVE BIOGEL PI IND STRL 8 (GLOVE) ×4 IMPLANT
GLOVE BIOGEL PI INDICATOR 8 (GLOVE) ×2
GOWN PREVENTION PLUS XLARGE (GOWN DISPOSABLE) ×3 IMPLANT
GOWN SRG XL XLNG 56XLVL 4 (GOWN DISPOSABLE) ×2 IMPLANT
GOWN STRL NON-REIN LRG LVL3 (GOWN DISPOSABLE) ×3 IMPLANT
GOWN STRL NON-REIN XL XLG LVL4 (GOWN DISPOSABLE) ×1
IMMOBILIZER KNEE 20 (SOFTGOODS) ×3
IMMOBILIZER KNEE 20 THIGH 36 (SOFTGOODS) ×2 IMPLANT
KIT BASIN OR (CUSTOM PROCEDURE TRAY) ×3 IMPLANT
KIT ROOM TURNOVER OR (KITS) ×3 IMPLANT
LOCK SCREW 1.5X15MM (Screw) ×3 IMPLANT
MANIFOLD NEPTUNE II (INSTRUMENTS) ×3 IMPLANT
NEEDLE 18GX1X1/2 (RX/OR ONLY) (NEEDLE) ×3 IMPLANT
NEEDLE 22X1 1/2 (OR ONLY) (NEEDLE) IMPLANT
NS IRRIG 1000ML POUR BTL (IV SOLUTION) ×6 IMPLANT
PACK ORTHO EXTREMITY (CUSTOM PROCEDURE TRAY) ×3 IMPLANT
PAD ARMBOARD 7.5X6 YLW CONV (MISCELLANEOUS) ×6 IMPLANT
PAD CAST 4YDX4 CTTN HI CHSV (CAST SUPPLIES) ×2 IMPLANT
PADDING CAST COTTON 4X4 STRL (CAST SUPPLIES) ×1
PADDING CAST COTTON 6X4 STRL (CAST SUPPLIES) ×3 IMPLANT
PLATE LOCK 7H STD LT PROX TIB (Plate) ×3 IMPLANT
PLATE LOCK COMP 6H 4.5 (Plate) ×3 IMPLANT
PLATE STRAIGHT LOCK 1.5 (Plate) ×3 IMPLANT
PLATE T SMALL 1.5MM (Plate) ×3 IMPLANT
SCREW CORT 4.5X48 (Screw) ×3 IMPLANT
SCREW CORT FT 32X3.5XNONLOCK (Screw) ×2 IMPLANT
SCREW CORTICAL 3.5MM  32MM (Screw) ×1 IMPLANT
SCREW CORTICAL 3.5MM 36MM (Screw) ×3 IMPLANT
SCREW L 1.5X14 (Screw) ×3 IMPLANT
SCREW LOCK 1.5X15MM (Screw) ×2 IMPLANT
SCREW LOCK CORT HEX 4.5X18 (Screw) ×6 IMPLANT
SCREW LOCK CORT HEX 4.5X20 (Screw) ×3 IMPLANT
SCREW LOCK CORT HEX 4.5X22 (Screw) ×3 IMPLANT
SCREW LOCK CORT STAR 3.5X26 (Screw) ×3 IMPLANT
SCREW LOCK CORT STAR 3.5X28 (Screw) ×3 IMPLANT
SCREW LOCK CORT STAR 3.5X30 (Screw) ×3 IMPLANT
SCREW LOCK CORT STAR 3.5X32 (Screw) ×3 IMPLANT
SCREW LOCK CORT STAR 3.5X56 (Screw) ×3 IMPLANT
SCREW LOCK CORT STAR 3.5X65 (Screw) ×3 IMPLANT
SCREW LOCK CORT STAR 3.5X70 (Screw) ×9 IMPLANT
SCREW LOCKING 1.5X10 (Screw) ×3 IMPLANT
SCREW NL 1.5X11 WRIST (Screw) ×6 IMPLANT
SCREW NL 1.5X12 (Screw) ×12 IMPLANT
SCREW NL 1.5X13 (Screw) ×6 IMPLANT
SCREW NLOCK CORT 4.5X22 (Screw) ×3 IMPLANT
SCREW NLOCK CORT 4.5X24 (Screw) ×3 IMPLANT
SCREW NONIOC 1.5 10M (Screw) ×3 IMPLANT
SCREW NONIOC 1.5 14M (Screw) ×12 IMPLANT
SLING ARM IMMOBILIZER LRG (SOFTGOODS) ×3 IMPLANT
SPLINT FIBERGLASS 4X30 (CAST SUPPLIES) ×6 IMPLANT
SPONGE GAUZE 4X4 12PLY (GAUZE/BANDAGES/DRESSINGS) ×6 IMPLANT
SPONGE LAP 4X18 X RAY DECT (DISPOSABLE) ×3 IMPLANT
SUCTION FRAZIER TIP 10 FR DISP (SUCTIONS) ×3 IMPLANT
SUT ETHILON 4 0 PS 2 18 (SUTURE) IMPLANT
SUT VIC AB 1 CT1 27 (SUTURE) ×2
SUT VIC AB 1 CT1 27XBRD ANBCTR (SUTURE) ×4 IMPLANT
SUT VIC AB 2-0 CT1 27 (SUTURE) ×2
SUT VIC AB 2-0 CT1 TAPERPNT 27 (SUTURE) ×4 IMPLANT
SUT VICRYL 0 CT 1 36IN (SUTURE) ×6 IMPLANT
SYR 50ML LL SCALE MARK (SYRINGE) ×3 IMPLANT
SYR CONTROL 10ML LL (SYRINGE) IMPLANT
TOWEL OR 17X24 6PK STRL BLUE (TOWEL DISPOSABLE) ×3 IMPLANT
TOWEL OR 17X26 10 PK STRL BLUE (TOWEL DISPOSABLE) ×3 IMPLANT
TUBE CONNECTING 12X1/4 (SUCTIONS) ×3 IMPLANT
WATER STERILE IRR 1000ML POUR (IV SOLUTION) IMPLANT

## 2013-08-13 NOTE — Progress Notes (Signed)
The patient has been cleared by neuro for left humerus I&D and ORIF by Dr Luiz Blare.. I spoke to the pt's mother and discussed surgery and risks/benefits. All questions were answered.For surgery today.

## 2013-08-13 NOTE — Progress Notes (Signed)
Subjective: Pt is still sedated, intubated.    Objective: Vital signs in last 24 hours: Temp:  [98.2 F (36.8 C)-100.9 F (38.3 C)] 99.9 F (37.7 C) (10/19 1000) Pulse Rate:  [99-137] 101 (10/19 1000) Resp:  [20-21] 20 (10/19 1000) BP: (97-137)/(45-94) 132/67 mmHg (10/19 1000) SpO2:  [98 %-100 %] 100 % (10/19 1000) Arterial Line BP: (107-156)/(56-75) 154/62 mmHg (10/19 1000) FiO2 (%):  [40 %] 40 % (10/19 0800)  Physical Exam  Constitutional:  The patient is sedated, on vent, and nonresponsive.  Head: Normocephalic. No obvious bony step-off is noted. Right periorbital ecchymosis and edema have improved. Pupils are pinpoint and non-reactive. Mouth/Throat: ET tube is in place. No bleeding or lesion.  Neck: No tracheal deviation present. c-collar in place  Skin: Skin is warm and dry.   Recent Labs  08/11/13 2048 08/11/13 2112 08/12/13 0338  WBC 8.9  --  17.4*  HGB 14.5 15.3 11.1*  HCT 39.6 45.0 31.4*  PLT 124*  --  93*    Recent Labs  08/11/13 2048 08/11/13 2112 08/12/13 0339  NA 141 143 145  K 3.7 3.3* 3.6  CL 105 102 113*  CO2 23  --  19  GLUCOSE 114* 112* 84  BUN 9 8 7   CREATININE 1.10 1.50* 0.91  CALCIUM 7.8*  --  6.3*    Medications:  I have reviewed the patient's current medications. Scheduled: . antiseptic oral rinse  15 mL Mouth Rinse QID  .  ceFAZolin (ANCEF) IV  2 g Intravenous Q8H  . chlorhexidine  15 mL Mouth Rinse BID  . midazolam  2-4 mg Intravenous Once  . pantoprazole  40 mg Oral Daily   Or  . pantoprazole (PROTONIX) IV  40 mg Intravenous Daily  . povidone-iodine   Topical Once   ZOX:[id number], fentaNYL, fentaNYL, midazolam, midazolam, ondansetron (ZOFRAN) IV, ondansetron  Assessment/Plan: Extensive facial fractures, involving the frontal sinus, bilateral orbital walls, the right maxillary walls, and the nasal septum. Most of the patient's facial fractures are nondisplaced. It is likely that the patient will not need any surgical  intervention. I will reassess the patient's visual status once his clinical condition improves.     LOS: 2 days   Ernestine Langworthy,SUI W 08/13/2013, 11:02 AM
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
## 2013-08-13 NOTE — Addendum Note (Signed)
Addendum created 08/13/13 1948 by Elisabeth Most, CRNA   Modules edited: Anesthesia Medication Administration
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
## 2013-08-13 NOTE — Transfer of Care (Signed)
Immediate Anesthesia Transfer of Care Note  Patient: Yariel Ferraris  Procedure(s) Performed: Procedure(s): OPEN REDUCTION INTERNAL FIXATION (ORIF) HUMERAL SHAFT FRACTURE (Left) OPEN REDUCTION INTERNAL FIXATION (ORIF) TIBIAL PLATEAU (Left)  Patient Location: NICU  Anesthesia Type:General  Level of Consciousness: sedated and Patient remains intubated per anesthesia plan  Airway & Oxygen Therapy: Patient remains intubated per anesthesia plan and Patient placed on Ventilator (see vital sign flow sheet for setting)  Post-op Assessment: Report given to NICU RN and Post -op Vital signs reviewed and stable  Post vital signs: Reviewed and stable  Complications: No apparent anesthesia complications
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
## 2013-08-13 NOTE — Anesthesia Preprocedure Evaluation (Signed)
Anesthesia Evaluation  Patient identified by MRN, date of birth, ID band Patient unresponsive    Reviewed: Allergy & Precautions, H&P , NPO status , Patient's Chart, lab work & pertinent test results  Airway       Dental   Pulmonary  + rhonchi  Decreased R BS R CT  + decreased breath sounds  + intubated    Cardiovascular Rhythm:Regular Rate:Normal     Neuro/Psych    GI/Hepatic   Endo/Other    Renal/GU      Musculoskeletal   Abdominal   Peds  Hematology   Anesthesia Other Findings OET in place On sedative protocol for closed head injury with OET  Reproductive/Obstetrics                           Anesthesia Physical Anesthesia Plan  ASA: III and emergent  Anesthesia Plan: General   Post-op Pain Management:    Induction:   Airway Management Planned: Oral ETT  Additional Equipment:   Intra-op Plan:   Post-operative Plan: Post-operative intubation/ventilation  Informed Consent: I have reviewed the patients History and Physical, chart, labs and discussed the procedure including the risks, benefits and alternatives for the proposed anesthesia with the patient or authorized representative who has indicated his/her understanding and acceptance.   History available from chart only  Plan Discussed with: CRNA and Surgeon  Anesthesia Plan Comments:         Anesthesia Quick Evaluation

## 2013-08-13 NOTE — Preoperative (Signed)
Beta Blockers   Reason not to administer Beta Blockers:Not Applicable 

## 2013-08-13 NOTE — Anesthesia Postprocedure Evaluation (Signed)
  Anesthesia Post-op Note  Patient: Cory [name]  Procedure(s) Performed: Procedure(s): OPEN REDUCTION INTERNAL FIXATION (ORIF) HUMERAL SHAFT FRACTURE (Left) OPEN REDUCTION INTERNAL FIXATION (ORIF) TIBIAL PLATEAU (Left)  Patient Location: ICU  Anesthesia Type:General  Level of Consciousness: Patient remains intubated per anesthesia plan  Airway and Oxygen Therapy: Patient remains intubated per anesthesia plan and Patient placed on Ventilator (see vital sign flow sheet for setting)  Post-op Pain: none  Post-op Assessment: Post-op Vital signs reviewed, Patient's Cardiovascular Status Stable, Respiratory Function Stable, Patent Airway, No signs of Nausea or vomiting and Pain level controlled  Post-op Vital Signs: Reviewed and stable  Complications: No apparent anesthesia complications

## 2013-08-13 NOTE — Brief Op Note (Signed)
08/11/2013 - 08/13/2013  5:55 PM  PATIENT:  Cory [name]  32 y.o. male  PRE-OPERATIVE DIAGNOSIS:  humerus fracture  POST-OPERATIVE DIAGNOSIS:  Displaced Tibial Plateau fracture; Unstable left humerus fracture  PROCEDURE:  Procedure(s): OPEN REDUCTION INTERNAL FIXATION (ORIF) HUMERAL SHAFT FRACTURE (Left) OPEN REDUCTION INTERNAL FIXATION (ORIF) TIBIAL PLATEAU (Left)  SURGEON:  Surgeon(s) and Role: Panel 1:    * Harvie Junior, MD - Primary  Panel 2:    * Harvie Junior, MD - Primary  PHYSICIAN ASSISTANT:   ASSISTANTS: bethune   ANESTHESIA:   general  EBL:  Total I/O In: 2072.8 [I.V.:2022.8; IV Piggyback:50] Out: 1090 [Urine:990; Blood:100]  BLOOD ADMINISTERED:none  DRAINS: none   LOCAL MEDICATIONS USED:  NONE  SPECIMEN:  No Specimen  DISPOSITION OF SPECIMEN:  N/A  COUNTS:  YES  TOURNIQUET:  * Missing tourniquet times found for documented tourniquets in log:  [id number] * Total Tourniquet Time Documented: Upper Arm (Left) - 58 minutes Total: Upper Arm (Left) - 58 minutes  Thigh (Left) - 89 minutes Total: Thigh (Left) - 89 minutes   DICTATION: .Other Dictation: Dictation Number [id number]  PLAN OF CARE: Admit to inpatient   PATIENT DISPOSITION:  ICU - intubated and hemodynamically stable.   Delay start of Pharmacological VTE agent (>24hrs) due to surgical blood loss or risk of bleeding: no

## 2013-08-14 ENCOUNTER — Inpatient Hospital Stay (HOSPITAL_COMMUNITY): Payer: Medicaid Other

## 2013-08-14 DIAGNOSIS — D62 Acute posthemorrhagic anemia: Secondary | ICD-10-CM

## 2013-08-14 DIAGNOSIS — S2249XA Multiple fractures of ribs, unspecified side, initial encounter for closed fracture: Secondary | ICD-10-CM

## 2013-08-14 MED ORDER — PIVOT 1.5 CAL PO LIQD
1000.0000 mL | ORAL | Status: DC
  Administered 2013-08-14: 1000 mL
  Filled 2013-08-14 (×2): qty 1000

## 2013-08-14 MED ORDER — POTASSIUM CHLORIDE IN NACL 20-0.9 MEQ/L-% IV SOLN
INTRAVENOUS | Status: DC
  Administered 2013-08-14: 18:00:00 via INTRAVENOUS
  Filled 2013-08-14 (×3): qty 1000

## 2013-08-14 MED ORDER — PRO-STAT SUGAR FREE PO LIQD
30.0000 mL | Freq: Every day | ORAL | Status: DC
  Administered 2013-08-14 – 2013-08-18 (×4): 30 mL
  Filled 2013-08-14 (×5): qty 30

## 2013-08-14 MED ORDER — POTASSIUM CHLORIDE IN NACL 20-0.45 MEQ/L-% IV SOLN: INTRAVENOUS | Status: DC

## 2013-08-14 MED ORDER — PIVOT 1.5 CAL PO LIQD
1000.0000 mL | ORAL | Status: DC
  Administered 2013-08-15 – 2013-08-18 (×3): 1000 mL
  Filled 2013-08-14 (×5): qty 1000

## 2013-08-14 NOTE — Progress Notes (Signed)
Subjective: Patient continues on ventilator via ETT. Sedated with Versed and fentanyl. Underwent the procedures yesterday. ICP stable and normal, ranging from 3-6 mmHg.  Objective: Vital signs in last 24 hours: Filed Vitals:   08/14/13 0300 08/14/13 0400 08/14/13 0500 08/14/13 0600  BP: 125/76 133/77 126/68 126/71  Pulse: 77 78 90 87  Temp: 97.9 F (36.6 C) 98.1 F (36.7 C) 98.2 F (36.8 C) 98.6 F (37 C)  TempSrc:  Core (Comment)    Resp: 20 20 20 20   Height:      Weight:      SpO2: 100% 100% 100% 100%    Intake/Output from previous day: 10/19 0701 - 10/20 0700 In: 4258.3 [I.V.:4108.3; IV Piggyback:150] Out: 2265 [Urine:2015; Blood:200; Chest Tube:50] Intake/Output this shift: Total I/O In: 1827 [I.V.:1727; IV Piggyback:100] Out: 875 [Urine:825; Chest Tube:50]  Physical Exam:  Not opening eyes, not tested speech. Not following commands. Pupils 1.5 mm, round, nonreactive to light. No movement of extremities to deep pain. Nursing staff does note occasional coughing.  CBC  Recent Labs  08/11/13 2048 08/11/13 2112 08/12/13 0338  WBC 8.9  --  17.4*  HGB 14.5 15.3 11.1*  HCT 39.6 45.0 31.4*  PLT 124*  --  93*   BMET  Recent Labs  08/11/13 2048 08/11/13 2112 08/12/13 0339  NA 141 143 145  K 3.7 3.3* 3.6  CL 105 102 113*  CO2 23  --  19  GLUCOSE 114* 112* 84  BUN 9 8 7   CREATININE 1.10 1.50* 0.91  CALCIUM 7.8*  --  6.3*    Studies/Results: Dg Tibia/fibula Left  08/13/2013   CLINICAL DATA:  Tibial fracture, internal fixation  EXAM: LEFT TIBIA AND FIBULA - 2 VIEW  COMPARISON:  08/13/2013  FINDINGS: Plate and screw fixation across the proximal tibial fracture. No hardware complicating feature. Near anatomic alignment.  IMPRESSION: Internal fixation across the proximal tibial fracture.   Electronically Signed   By: Charlett Nose M.D.   On: 08/13/2013 18:09   Dg Chest Port 1 View  08/12/2013   CLINICAL DATA:  Right chest tube  EXAM: PORTABLE CHEST - 1 VIEW   COMPARISON:  Prior chest x-ray 08/11/2013  FINDINGS: Endotracheal tube in good position 4.1 cm above the carinal. Nasogastric tube remains present, the tip lies below the field of view, presumably within the stomach. Stable position of right-sided thoracostomy tube. Significantly decreased basilar pneumothorax. Trace pleural air remains. Decreasing right-sided perihilar atelectasis and bilateral pulmonary contusions. The lungs are better expanded on today's examination. New mediastinal air. No new pleural effusion. Rib fractures better demonstrated on prior CT.  IMPRESSION: 1. New small pneumomediastinum. 2. Decreasing right pneumothorax. Only trace basilar subpulmonic air remains. Chest tube remains in place. 3. Improved lung volumes with decreasing bilateral pulmonary contusions and right perihilar atelectasis. 4. Stable and satisfactory support apparatus.   Electronically Signed   By: Malachy Moan M.D.   On: 08/12/2013 09:00   Dg Tibia/fibula Left Port  08/13/2013   CLINICAL DATA:  The normal  EXAM: PORTABLE LEFT TIBIA AND FIBULA - 2 VIEW  COMPARISON:  None.  FINDINGS: There is a comminuted and markedly displaced proximal tibia and fibular fractures with angulation. There is no obvious extension into the tibial plateau. A large joint effusion is present therefore occult extension into the knee joint may be present.  IMPRESSION: Comminuted and displaced proximal tibia and fibular fractures as described. A large joint effusion is present and extension into the joint space cannot be excluded.  CT may be helpful.   Electronically Signed   By: Maryclare Bean M.D.   On: 08/13/2013 12:01   Dg Humerus Left  08/13/2013   CLINICAL DATA:  Humeral fracture, internal fixation  EXAM: LEFT HUMERUS - 2+ VIEW; DG C-ARM GT 120 MIN  COMPARISON:  08/11/2013  FINDINGS: Plate and screw fixation across the transverse left humeral fracture. Near anatomic alignment. No hardware or bony complicating feature.  IMPRESSION: Internal  fixation of the left humeral fracture.   Electronically Signed   By: Charlett Nose M.D.   On: 08/13/2013 18:10   Dg C-arm Gt 120 Min  08/13/2013   CLINICAL DATA:  Humeral fracture, internal fixation  EXAM: LEFT HUMERUS - 2+ VIEW; DG C-ARM GT 120 MIN  COMPARISON:  08/11/2013  FINDINGS: Plate and screw fixation across the transverse left humeral fracture. Near anatomic alignment. No hardware or bony complicating feature.  IMPRESSION: Internal fixation of the left humeral fracture.   Electronically Signed   By: Charlett Nose M.D.   On: 08/13/2013 18:10    Assessment/Plan: Severe head injury, ICP stable. Will recheck CT brain without contrast in a.m.  Continue supportive care.   Hewitt Shorts, MD 08/14/2013, 6:55 AM
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
## 2013-08-14 NOTE — Progress Notes (Signed)
Subjective: 1 Day Post-Op Procedure(s) (LRB): OPEN REDUCTION INTERNAL FIXATION (ORIF) HUMERAL SHAFT FRACTURE (Left) OPEN REDUCTION INTERNAL FIXATION (ORIF) TIBIAL PLATEAU (Left) ICP's staying low.Still intubated.    Objective: Vital signs in last 24 hours: Temp:  [96.4 F (35.8 C)-100.2 F (37.9 C)] 98.6 F (37 C) (10/20 0800) Pulse Rate:  [59-123] 97 (10/20 0800) Resp:  [20-27] 22 (10/20 0800) BP: (118-158)/(48-110) 134/78 mmHg (10/20 0800) SpO2:  [100 %] 100 % (10/20 0800) Arterial Line BP: (116-168)/(57-78) 132/74 mmHg (10/20 0800) FiO2 (%):  [40 %] 40 % (10/20 0800)  Intake/Output from previous day: 10/19 0701 - 10/20 0700 In: 4415.3 [I.V.:4265.3; IV Piggyback:150] Out: 2265 [Urine:2015; Blood:200; Chest Tube:50] Intake/Output this shift: Total I/O In: 157 [I.V.:157] Out: 200 [Urine:200]   Recent Labs  08/11/13 2048 08/11/13 2112 08/12/13 0338  HGB 14.5 15.3 11.1*    Recent Labs  08/11/13 2048 08/11/13 2112 08/12/13 0338  WBC 8.9  --  17.4*  RBC 4.48  --  3.53*  HCT 39.6 45.0 31.4*  PLT 124*  --  93*    Recent Labs  08/11/13 2048 08/11/13 2112 08/12/13 0339  NA 141 143 145  K 3.7 3.3* 3.6  CL 105 102 113*  CO2 23  --  19  BUN 9 8 7   CREATININE 1.10 1.50* 0.91  GLUCOSE 114* 112* 84  CALCIUM 7.8*  --  6.3*    Recent Labs  08/11/13 2048  INR 1.11   Left upper ext: Long-arm fiberglass posterior splint in place.  Some swelling in fingers. Left lower extremity: Knee immobilizer in place.   Some swelling in his foot.  DP pulse 1+.  Assessment/Plan: 1 Day Post-Op Procedure(s) (LRB): OPEN REDUCTION INTERNAL FIXATION (ORIF) HUMERAL SHAFT FRACTURE (Left) OPEN REDUCTION INTERNAL FIXATION (ORIF) TIBIAL PLATEAU (Left) Plan: Will follow patient.  I will have nursing wrap left foot with an Ace wrap for compression.   Joceline Hinchcliff G 08/14/2013, 9:06 AM
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
## 2013-08-15 ENCOUNTER — Encounter (HOSPITAL_COMMUNITY): Payer: Self-pay | Admitting: Radiology

## 2013-08-15 ENCOUNTER — Inpatient Hospital Stay (HOSPITAL_COMMUNITY): Payer: Medicaid Other

## 2013-08-15 LAB — COMPREHENSIVE METABOLIC PANEL
ALT: 23 U/L (ref 0–53)
Albumin: 2.3 g/dL — ABNORMAL LOW (ref 3.5–5.2)
CO2: 25 mEq/L (ref 19–32)
Calcium: 6.4 mg/dL — CL (ref 8.4–10.5)
Chloride: 127 mEq/L — ABNORMAL HIGH (ref 96–112)
Creatinine, Ser: 0.85 mg/dL (ref 0.50–1.35)
GFR calc Af Amer: >90 mL/min (ref >90)
GFR calc non Af Amer: >90 mL/min (ref >90)
Glucose, Bld: 122 mg/dL — ABNORMAL HIGH (ref 70–99)
Total Bilirubin: 0.6 mg/dL (ref 0.3–1.2)

## 2013-08-15 LAB — CBC
Hemoglobin: 7.1 g/dL — ABNORMAL LOW (ref 13.0–17.0)
MCH: 31.8 pg (ref 26.0–34.0)
MCV: 91.9 fL (ref 78.0–100.0)
RBC: 2.23 MIL/uL — ABNORMAL LOW (ref 4.22–5.81)

## 2013-08-15 LAB — GLUCOSE, CAPILLARY
Glucose-Capillary: 105 mg/dL — ABNORMAL HIGH (ref 70–99)
Glucose-Capillary: 93 mg/dL (ref 70–99)
Glucose-Capillary: 132 mg/dL — ABNORMAL HIGH (ref 70–99)

## 2013-08-15 LAB — BASIC METABOLIC PANEL
BUN: 12 mg/dL (ref 6–23)
CO2: 27 mEq/L (ref 19–32)
Calcium: 6.9 mg/dL — ABNORMAL LOW (ref 8.4–10.5)
Calcium: 7.2 mg/dL — ABNORMAL LOW (ref 8.4–10.5)
Chloride: 126 mEq/L — ABNORMAL HIGH (ref 96–112)
Creatinine, Ser: 0.84 mg/dL (ref 0.50–1.35)
Creatinine, Ser: 0.76 mg/dL (ref 0.50–1.35)
GFR calc Af Amer: >90 mL/min (ref >90)
GFR calc non Af Amer: >90 mL/min (ref >90)
Glucose, Bld: 104 mg/dL — ABNORMAL HIGH (ref 70–99)

## 2013-08-15 LAB — POCT I-STAT 7, (LYTES, BLD GAS, ICA,H+H)
Bicarbonate: 23.1 meq/L (ref 20.0–24.0)
Calcium, Ion: 0.99 mmol/L — ABNORMAL LOW (ref 1.12–1.23)
Potassium: 3.4 meq/L — ABNORMAL LOW (ref 3.5–5.1)
Sodium: 156 meq/L — ABNORMAL HIGH (ref 135–145)
TCO2: 24 mmol/L (ref 0–100)
pH, Arterial: 7.467 — ABNORMAL HIGH (ref 7.350–7.450)

## 2013-08-15 LAB — PREPARE RBC (CROSSMATCH)

## 2013-08-15 MED ORDER — SODIUM CHLORIDE 0.9 % IJ SOLN
10.0000 mL | Freq: Two times a day (BID) | INTRAMUSCULAR | Status: DC
  Administered 2013-08-15 – 2013-08-16 (×4): 10 mL
  Administered 2013-08-17: 20 mL
  Administered 2013-08-18 (×2): 10 mL
  Administered 2013-08-19: 20 mL
  Administered 2013-08-19 – 2013-08-20 (×2): 10 mL
  Administered 2013-08-20 – 2013-08-21 (×2): 30 mL
  Administered 2013-08-21: 20 mL
  Administered 2013-08-22 – 2013-08-25 (×5): 10 mL
  Administered 2013-08-25: 30 mL
  Administered 2013-08-26: 20 mL
  Administered 2013-08-26 – 2013-09-04 (×13): 10 mL

## 2013-08-15 MED ORDER — SODIUM CHLORIDE 0.9 % IJ SOLN
10.0000 mL | INTRAMUSCULAR | Status: DC | PRN
  Administered 2013-08-22 – 2013-08-31 (×2): 10 mL

## 2013-08-15 MED ORDER — FREE WATER
300.0000 mL | Status: DC
  Administered 2013-08-15 – 2013-08-18 (×14): 300 mL

## 2013-08-15 MED ORDER — SODIUM CHLORIDE 0.45 % IV SOLN
INTRAVENOUS | Status: DC
  Administered 2013-08-15 – 2013-08-28 (×19): via INTRAVENOUS
  Filled 2013-08-15 (×35): qty 1000

## 2013-08-15 MED ORDER — SODIUM CHLORIDE 0.9 % IV SOLN
1.0000 g | Freq: Once | INTRAVENOUS | Status: AC
  Administered 2013-08-15: 1 g via INTRAVENOUS
  Filled 2013-08-15: qty 10

## 2013-08-15 MED ORDER — CALCIUM GLUCONATE 10 % IV SOLN
1.0000 g | Freq: Once | INTRAVENOUS | Status: AC
  Administered 2013-08-15: 1 g via INTRAVENOUS
  Filled 2013-08-15: qty 10

## 2013-08-15 MED ORDER — SODIUM CHLORIDE 0.9 % IV SOLN
INTRAVENOUS | Status: DC
  Administered 2013-08-15: 1000 mL via INTRAVENOUS

## 2013-08-15 NOTE — Progress Notes (Signed)
CRITICAL VALUE ALERT  Critical value received:  Calcium 6.4  Date of notification:  08/15/2013  Time of notification:  0600  Critical value read back:yes  Nurse who received alert:  Huel Cote, RN   MD notified (1st page):  Dr. Andrey Campanile  Time of first page:  0610  Responding MD:  Dr. Andrey Campanile   Time MD responded:  [phone]

## 2013-08-15 NOTE — Progress Notes (Signed)
Intermittent full body tremors noted for 2 minutes.  Dr. Newell Coral notifed, will continue to monitor.

## 2013-08-15 NOTE — Progress Notes (Signed)
Peripherally Inserted Central Catheter/Midline Placement  The IV Nurse has discussed with the patient and/or persons authorized to consent for the patient, the purpose of this procedure and the potential benefits and risks involved with this procedure.  The benefits include less needle sticks, lab draws from the catheter and patient may be discharged home with the catheter.  Risks include, but not limited to, infection, bleeding, blood clot (thrombus formation), and puncture of an artery; nerve damage and irregular heat beat.  Alternatives to this procedure were also discussed.  PICC/Midline Placement Documentation    Discussed with mother, Zavion Sleight.    Franne Grip Renee 08/15/2013, 9:33 AM

## 2013-08-15 NOTE — Progress Notes (Signed)
Subjective: Pt remains sedated, intubated. Not responsive.  Objective: Vital signs in last 24 hours: Temp:  [98.6 F (37 C)-100.2 F (37.9 C)] 99.9 F (37.7 C) (10/21 1045) Pulse Rate:  [66-98] 90 (10/21 1045) Resp:  [8-21] 10 (10/21 1045) BP: (120-181)/(58-78) 136/65 mmHg (10/21 0800) SpO2:  [100 %] 100 % (10/21 1045) Arterial Line BP: (105-200)/(53-86) 143/59 mmHg (10/21 1045) FiO2 (%):  [40 %] 40 % (10/21 1045) Weight:  [59.5 kg (131 lb 2.8 oz)] 59.5 kg (131 lb 2.8 oz) (10/21 0500)  Physical Exam  Constitutional: The patient is sedated, on vent, and nonresponsive.  Head: Normocephalic. No obvious bony step-off is noted. Right periorbital ecchymosis and edema have improved.  Pupils are pinpoint and non-reactive.  Mouth/Throat: ET tube is in place. No bleeding or lesion.  Neck: No tracheal deviation present. c-collar in place  Skin: Skin is warm and dry.    Recent Labs  08/13/13 1659 08/15/13 0530  WBC  --  16.3*  HGB 8.8* 7.1*  HCT 26.0* 20.5*  PLT  --  65*    Recent Labs  08/13/13 1659 08/15/13 0530  NA 156* 159*  K 3.4* 3.5  CL  --  127*  CO2  --  25  GLUCOSE  --  122*  BUN  --  14  CREATININE  --  0.85  CALCIUM  --  6.4*    Medications:  I have reviewed the patient's current medications. Scheduled: . antiseptic oral rinse  15 mL Mouth Rinse QID  .  ceFAZolin (ANCEF) IV  2 g Intravenous Q8H  . chlorhexidine  15 mL Mouth Rinse BID  . feeding supplement (PIVOT 1.5 CAL)  1,000 mL Per Tube Q24H  . feeding supplement (PRO-STAT SUGAR FREE 64)  30 mL Per Tube Daily  . free water  300 mL Per Tube Q4H  . midazolam  2-4 mg Intravenous Once  . pantoprazole  40 mg Oral Daily   Or  . pantoprazole (PROTONIX) IV  40 mg Intravenous Daily  . povidone-iodine   Topical Once  . sodium chloride  10-40 mL Intracatheter Q12H   ZOX:[id number], fentaNYL, fentaNYL, midazolam, midazolam, ondansetron (ZOFRAN) IV, ondansetron, sodium chloride  Assessment/Plan: Extensive  facial fractures, involving the frontal sinus, bilateral orbital walls, the right maxillary walls, and the nasal septum. Most of the patient's facial fractures are nondisplaced. It is likely that the patient will not need any surgical intervention. I will reassess the patient's functional and visual status once his clinical condition improves.     LOS: 4 days   Cory [name],SUI W 08/15/2013, 10:59 AM

## 2013-08-15 NOTE — Progress Notes (Signed)
Subjective: 2 Days Post-Op Procedure(s) (LRB): OPEN REDUCTION INTERNAL FIXATION (ORIF) HUMERAL SHAFT FRACTURE (Left) OPEN REDUCTION INTERNAL FIXATION (ORIF) TIBIAL PLATEAU (Left) Still unresponsive and intubated.  Objective: Vital signs in last 24 hours: Temp:  [98.4 F (36.9 C)-100.2 F (37.9 C)] 99.7 F (37.6 C) (10/21 0900) Pulse Rate:  [66-131] 91 (10/21 0900) Resp:  [8-21] 10 (10/21 0900) BP: (120-181)/(58-89) 136/65 mmHg (10/21 0800) SpO2:  [100 %] 100 % (10/21 0900) Arterial Line BP: (105-200)/(53-86) 152/58 mmHg (10/21 0900) FiO2 (%):  [40 %] 40 % (10/21 0900) Weight:  [59.5 kg (131 lb 2.8 oz)] 59.5 kg (131 lb 2.8 oz) (10/21 0500)  Intake/Output from previous day: 10/20 0701 - 10/21 0700 In: 3602 [I.V.:2802; NG/GT:650; IV Piggyback:150] Out: 2115 [Urine:2115] Intake/Output this shift: Total I/O In: 170.8 [I.V.:90.8; NG/GT:80] Out: 275 [Urine:275]   Recent Labs  08/13/13 1659 08/15/13 0530  HGB 8.8* 7.1*    Recent Labs  08/13/13 1659 08/15/13 0530  WBC  --  16.3*  RBC  --  2.23*  HCT 26.0* 20.5*  PLT  --  65*    Recent Labs  08/13/13 1659 08/15/13 0530  NA 156* 159*  K 3.4* 3.5  CL  --  127*  CO2  --  25  BUN  --  14  CREATININE  --  0.85  GLUCOSE  --  122*  CALCIUM  --  6.4*   Left arm splint intact. Dressing clean and dry. Mild swelling in left hand. Knee immobilizer intact to left leg.    Assessment/Plan:  2 Days Post-Op Procedure(s) (LRB): OPEN REDUCTION INTERNAL FIXATION (ORIF) HUMERAL SHAFT FRACTURE (Left) OPEN REDUCTION INTERNAL FIXATION (ORIF) TIBIAL PLATEAU (Left) Open left humerus fx  Plan: Will follow pt along.  Needs 48 hrs of IV antibiotics post op for open fx.( surg on Sunday)   Cory [name] G 08/15/2013, 9:50 AM

## 2013-08-15 NOTE — Progress Notes (Signed)
Orthopedic Tech Progress Note Patient Details:  Cory [name] 1981-04-19 [id number] Bil. prafo boots delivered to nurse secretary. Spoke with nurse, patient in sterile procedure in room at this time. Nurse to apply prafo boots when procedure is concluded.  Ortho Devices Type of Ortho Device: Postop shoe/boot Ortho Device/Splint Location: Bil. Prafo Ortho Device/Splint Interventions: Ordered   [place] 08/15/2013, 8:52 AM

## 2013-08-15 NOTE — Progress Notes (Signed)
Subjective: Patient continues on ventilator via ETT.  Sedated with Versed and fentanyl. CT of brain this morning shows continued evolution of large left temporal hemorrhagic contusion with overlying small subdural hematoma (unchanged), continued evolution of moderate sized right frontal hemorrhagic contusion, and no change in right anterior temporal fossa epidural hematoma.  ICP 1-4 mmHg.  This morning's labs show significant derrangements with markedly increased anemia (7.1/20.5), increased thrombocytopenia, worsening hypernatremia. Nursing staff reports that they notified trauma surgery of hypocalcemia and the order given was to remove potassium from the IV fluids.  Nurse this morning reports that she has been giving free were done the feeding tube, proximal 100 cc, but every 4 hours, but has not been documenting that in the I's and O's.   Objective: Vital signs in last 24 hours: Filed Vitals:   08/15/13 0300 08/15/13 0400 08/15/13 0500 08/15/13 0600  BP: 145/70 146/64 127/58 124/72  Pulse: 72 81 79 83  Temp: 99.7 F (37.6 C) 100 F (37.8 C) 99.9 F (37.7 C) 99.9 F (37.7 C)  TempSrc:  Core (Comment)    Resp: 21 20 20 20   Height:      Weight:   59.5 kg (131 lb 2.8 oz)   SpO2: 100% 100% 100% 100%    Intake/Output from previous day: 10/20 0701 - 10/21 0700 In: 3502.7 [I.V.:2742.7; NG/GT:610; IV Piggyback:150] Out: 2115 [Urine:2115] Intake/Output this shift:    Physical Exam:  Not opening eyes, no attempts at speech. Pupils 1 mm bilaterally, round, not reactive to light. No response to deep pain. Nursing staff reports coughing which they suppress with increased sedation.  CBC  Recent Labs  08/13/13 1659 08/15/13 0530  WBC  --  16.3*  HGB 8.8* 7.1*  HCT 26.0* 20.5*  PLT  --  65*   BMET  Recent Labs  08/13/13 1659 08/15/13 0530  NA 156* 159*  K 3.4* 3.5  CL  --  127*  CO2  --  25  GLUCOSE  --  122*  BUN  --  14  CREATININE  --  0.85  CALCIUM  --  6.4*   ABG     Component Value Date/Time   PHART 7.467* 08/13/2013 1659   PCO2ART 31.9* 08/13/2013 1659   PO2ART 394.0* 08/13/2013 1659   HCO3 23.1 08/13/2013 1659   TCO2 24 08/13/2013 1659   ACIDBASEDEF 7.0* 08/12/2013 0423   O2SAT 100.0 08/13/2013 1659    Studies/Results: Dg Tibia/fibula Left  08/13/2013   CLINICAL DATA:  Tibial fracture, internal fixation  EXAM: LEFT TIBIA AND FIBULA - 2 VIEW  COMPARISON:  08/13/2013  FINDINGS: Plate and screw fixation across the proximal tibial fracture. No hardware complicating feature. Near anatomic alignment.  IMPRESSION: Internal fixation across the proximal tibial fracture.   Electronically Signed   By: Charlett Nose M.D.   On: 08/13/2013 18:09   Dg Chest Port 1 View  08/14/2013   CLINICAL DATA:  Trauma, pneumothorax  EXAM: PORTABLE CHEST - 1 VIEW  COMPARISON:  08/12/2013  FINDINGS: Cardiomediastinal silhouette is stable. Stable endotracheal and NG tube position. Tiny pneumomediastinum again noted. Stable right chest tube position. Tiny right apical pneumothorax. No acute infiltrate or pulmonary edema.  IMPRESSION: Stable endotracheal and NG tube position. Tiny pneumomediastinum again noted. Stable right chest tube position. Tiny right apical pneumothorax. No acute infiltrate or pulmonary edema.   Electronically Signed   By: Natasha Mead M.D.   On: 08/14/2013 10:35   Dg Tibia/fibula Left Port  08/13/2013   CLINICAL DATA:  The normal  EXAM: PORTABLE LEFT TIBIA AND FIBULA - 2 VIEW  COMPARISON:  None.  FINDINGS: There is a comminuted and markedly displaced proximal tibia and fibular fractures with angulation. There is no obvious extension into the tibial plateau. A large joint effusion is present therefore occult extension into the knee joint may be present.  IMPRESSION: Comminuted and displaced proximal tibia and fibular fractures as described. A large joint effusion is present and extension into the joint space cannot be excluded. CT may be helpful.   Electronically  Signed   By: Maryclare Bean M.D.   On: 08/13/2013 12:01   Dg Humerus Left  08/13/2013   CLINICAL DATA:  Humeral fracture, internal fixation  EXAM: LEFT HUMERUS - 2+ VIEW; DG C-ARM GT 120 MIN  COMPARISON:  08/11/2013  FINDINGS: Plate and screw fixation across the transverse left humeral fracture. Near anatomic alignment. No hardware or bony complicating feature.  IMPRESSION: Internal fixation of the left humeral fracture.   Electronically Signed   By: Charlett Nose M.D.   On: 08/13/2013 18:10   Dg C-arm Gt 120 Min  08/13/2013   CLINICAL DATA:  Humeral fracture, internal fixation  EXAM: LEFT HUMERUS - 2+ VIEW; DG C-ARM GT 120 MIN  COMPARISON:  08/11/2013  FINDINGS: Plate and screw fixation across the transverse left humeral fracture. Near anatomic alignment. No hardware or bony complicating feature.  IMPRESSION: Internal fixation of the left humeral fracture.   Electronically Signed   By: Charlett Nose M.D.   On: 08/13/2013 18:10    Assessment/Plan: CT scan shows expected evolution of severe head injury. ICP stable. Worsening and significant anemia, worsening thrombocytopenia. Significant hypernatremia, borderline hypokalemia, hypocalcemia.  Have ordered transfusion of 2 units of packed red blood cells, and changing current IVF of normal saline 75 cc per hour to half normal saline with 40 mEq of KCl per liter at 50 cc per hour and adding free water via the feeding tube at 300 cc every 4 hours. I've requested repeat BMET and CBC with differential in a.m. I've asked the nursing staff to speak with trauma surgery further regarding hypocalcemia.  Hewitt Shorts, MD 08/15/2013, 7:01 AM
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
## 2013-08-16 ENCOUNTER — Inpatient Hospital Stay (HOSPITAL_COMMUNITY): Payer: Medicaid Other

## 2013-08-16 DIAGNOSIS — J95821 Acute postprocedural respiratory failure: Secondary | ICD-10-CM

## 2013-08-16 LAB — CBC WITH DIFFERENTIAL/PLATELET
Basophils Absolute: 0 10*3/uL (ref 0.0–0.1)
Basophils Relative: 0 % (ref 0–1)
Eosinophils Absolute: 0.3 10*3/uL (ref 0.0–0.7)
Eosinophils Relative: 2 % (ref 0–5)
HCT: 28.1 % — ABNORMAL LOW (ref 39.0–52.0)
Hemoglobin: 9.8 g/dL — ABNORMAL LOW (ref 13.0–17.0)
Lymphocytes Relative: 11 % — ABNORMAL LOW (ref 12–46)
Lymphs Abs: 1.3 10*3/uL (ref 0.7–4.0)
MCH: 31.5 pg (ref 26.0–34.0)
MCHC: 34.9 g/dL (ref 30.0–36.0)
MCV: 90.4 fL (ref 78.0–100.0)
Monocytes Absolute: 1.1 10*3/uL — ABNORMAL HIGH (ref 0.1–1.0)
Monocytes Relative: 10 % (ref 3–12)
Neutro Abs: 8.9 10*3/uL — ABNORMAL HIGH (ref 1.7–7.7)
Neutrophils Relative %: 77 % (ref 43–77)
Platelets: 62 10*3/uL — ABNORMAL LOW (ref 150–400)
RBC: 3.11 MIL/uL — ABNORMAL LOW (ref 4.22–5.81)
RDW: 15.1 % (ref 11.5–15.5)
WBC: 11.5 10*3/uL — ABNORMAL HIGH (ref 4.0–10.5)

## 2013-08-16 LAB — BLOOD GAS, ARTERIAL
Bicarbonate: 25.2 mEq/L — ABNORMAL HIGH (ref 20.0–24.0)
Drawn by: 36274
O2 Saturation: 98.5 %
PEEP: 5 cmH2O
TCO2: 26.3 mmol/L (ref 0–100)
pCO2 arterial: 34.1 mmHg — ABNORMAL LOW (ref 35.0–45.0)
pH, Arterial: 7.482 — ABNORMAL HIGH (ref 7.350–7.450)
pO2, Arterial: 82.5 mmHg (ref 80.0–100.0)

## 2013-08-16 LAB — GLUCOSE, CAPILLARY
Glucose-Capillary: 116 mg/dL — ABNORMAL HIGH (ref 70–99)
Glucose-Capillary: 119 mg/dL — ABNORMAL HIGH (ref 70–99)
Glucose-Capillary: 126 mg/dL — ABNORMAL HIGH (ref 70–99)
Glucose-Capillary: 78 mg/dL (ref 70–99)

## 2013-08-16 LAB — BASIC METABOLIC PANEL
BUN: 12 mg/dL (ref 6–23)
BUN: 14 mg/dL (ref 6–23)
CO2: 24 mEq/L (ref 19–32)
CO2: 26 mEq/L (ref 19–32)
Calcium: 7.4 mg/dL — ABNORMAL LOW (ref 8.4–10.5)
Chloride: 119 mEq/L — ABNORMAL HIGH (ref 96–112)
Chloride: 114 mEq/L — ABNORMAL HIGH (ref 96–112)
Creatinine, Ser: 0.71 mg/dL (ref 0.50–1.35)
Creatinine, Ser: 0.67 mg/dL (ref 0.50–1.35)
GFR calc Af Amer: >90 mL/min (ref >90)
GFR calc Af Amer: >90 mL/min (ref >90)
GFR calc non Af Amer: >90 mL/min (ref >90)
Glucose, Bld: 141 mg/dL — ABNORMAL HIGH (ref 70–99)
Potassium: 3.4 mEq/L — ABNORMAL LOW (ref 3.5–5.1)
Potassium: 3.6 mEq/L (ref 3.5–5.1)
Sodium: 151 mEq/L — ABNORMAL HIGH (ref 135–145)
Sodium: 148 mEq/L — ABNORMAL HIGH (ref 135–145)

## 2013-08-16 LAB — TYPE AND SCREEN
ABO/RH(D): A POS
Antibody Screen: NEGATIVE
Unit division: 0
Unit division: 0

## 2013-08-16 LAB — OSMOLALITY, URINE: Osmolality, Ur: 317 mOsm/kg — ABNORMAL LOW (ref 390–1090)

## 2013-08-16 MED ORDER — ACETAMINOPHEN 160 MG/5ML PO SOLN
650.0000 mg | ORAL | Status: DC | PRN
  Administered 2013-08-16 – 2013-09-12 (×29): 650 mg
  Filled 2013-08-16 (×29): qty 20.3

## 2013-08-16 NOTE — Progress Notes (Signed)
Subjective: Pt is still intubated and sedated.  Not responsive.  Objective: Vital signs in last 24 hours: Temp:  [99.7 F (37.6 C)-102.2 F (39 C)] 100.2 F (37.9 C) (10/22 1100) Pulse Rate:  [60-105] 76 (10/22 1100) Resp:  [10-22] 16 (10/22 1100) BP: (106-153)/(55-85) 138/63 mmHg (10/22 1100) SpO2:  [99 %-100 %] 100 % (10/22 1100) Arterial Line BP: (136-159)/(61-70) 149/64 mmHg (10/22 1100) FiO2 (%):  [40 %] 40 % (10/22 0800) Weight:  [59 kg (130 lb 1.1 oz)] 59 kg (130 lb 1.1 oz) (10/22 0500)  Physical Exam  Constitutional: The patient is sedated, on vent, and nonresponsive.  Head: Normocephalic. No obvious bony step-off is noted. Right periorbital ecchymosis and edema have improved.  Pupils are pinpoint and non-reactive.  Mouth/Throat: ET tube is in place. No bleeding or lesion.  Neck: No tracheal deviation present. c-collar in place  Skin: Skin is warm and dry.    Recent Labs  08/15/13 0530 08/16/13 0406  WBC 16.3* 11.5*  HGB 7.1* 9.8*  HCT 20.5* 28.1*  PLT 65* 62*    Recent Labs  08/15/13 1810 08/16/13 0406  NA 156* 151*  K 3.7 3.4*  CL 123* 119*  CO2 27 24  GLUCOSE 101* 141*  BUN 12 12  CREATININE 0.76 0.71  CALCIUM 7.2* 7.4*    Medications:  I have reviewed the patient's current medications. Scheduled: . antiseptic oral rinse  15 mL Mouth Rinse QID  . chlorhexidine  15 mL Mouth Rinse BID  . feeding supplement (PIVOT 1.5 CAL)  1,000 mL Per Tube Q24H  . feeding supplement (PRO-STAT SUGAR FREE 64)  30 mL Per Tube Daily  . free water  300 mL Per Tube Q4H  . midazolam  2-4 mg Intravenous Once  . pantoprazole  40 mg Oral Daily   Or  . pantoprazole (PROTONIX) IV  40 mg Intravenous Daily  . povidone-iodine   Topical Once  . sodium chloride  10-40 mL Intracatheter Q12H   WUJ:WJXBJYNWGNFAO (TYLENOL) oral liquid 160 mg/5 mL, fentaNYL, fentaNYL, midazolam, midazolam, ondansetron (ZOFRAN) IV, ondansetron, sodium chloride  Assessment/Plan: Extensive facial  fractures, involving the frontal sinus, bilateral orbital walls, the right maxillary walls, and the nasal septum. Most of the patient's facial fractures are nondisplaced. It is likely that the patient will not need any surgical intervention. I will reassess the patient's functional and visual status after he is awake and off vent.    LOS: 5 days   Cory [name],SUI W 08/16/2013, 12:00 PM

## 2013-08-16 NOTE — Progress Notes (Signed)
Subjective: Patient continues on ventilator via ETT. Continues to be sedated with Versed and fentanyl. ICP 6-9 mmHg.  Objective: Vital signs in last 24 hours: Filed Vitals:   08/16/13 0600 08/16/13 0700 08/16/13 0714 08/16/13 0800  BP: 135/71 127/66  129/67  Pulse: 75 75 60 83  Temp: 100.6 F (38.1 C) 99.9 F (37.7 C)  100 F (37.8 C)  TempSrc:    Core (Comment)  Resp: 20 22 19 17   Height:      Weight:      SpO2: 100% 100% 100% 100%    Intake/Output from previous day: 10/21 0701 - 10/22 0700 In: 4711.8 [I.V.:2864.8; Blood:741.7; NG/GT:945.3; IV Piggyback:160] Out: 4320 [Urine:4320] Intake/Output this shift: Total I/O In: 157 [I.V.:117; NG/GT:40] Out: 500 [Urine:500]  Physical Exam:  Not opening eyes to voice or pain. No attempts at speech. No movement to deep pain. Pupils about 1 mm bilaterally, not reactive to light.  CBC  Recent Labs  08/15/13 0530 08/16/13 0406  WBC 16.3* 11.5*  HGB 7.1* 9.8*  HCT 20.5* 28.1*  PLT 65* 62*   BMET  Recent Labs  08/15/13 1810 08/16/13 0406  NA 156* 151*  K 3.7 3.4*  CL 123* 119*  CO2 27 24  GLUCOSE 101* 141*  BUN 12 12  CREATININE 0.76 0.71  CALCIUM 7.2* 7.4*   ABG    Component Value Date/Time   PHART 7.482* 08/16/2013 0445   PCO2ART 34.1* 08/16/2013 0445   PO2ART 82.5 08/16/2013 0445   HCO3 25.2* 08/16/2013 0445   TCO2 26.3 08/16/2013 0445   ACIDBASEDEF 7.0* 08/12/2013 0423   O2SAT 98.5 08/16/2013 0445    Studies/Results: Ct Head Wo Contrast  08/15/2013   *RADIOLOGY REPORT*  Clinical Data: Follow-up head injury.  CT HEAD WITHOUT CONTRAST  Technique:  Contiguous axial images were obtained from the base of the skull through the vertex without contrast.  Comparison: CT of the head August 12, 2013  Findings: Again seen are multiple areas of hemorrhagic contusions, largest within the left temporal lobe, 4.85 x 6.2 cm, similar. Surrounding low density vasogenic edema.  Sub centimeter hemorrhagic contusions within  the left frontal lobe, including the deep white matter, and right greater left frontal lobe convexities, some of which have blossomed from prior examination. Sub centimeter hyperdensity within the right ventral pons, present previously.  Small right frontal temporal extra-axial hemorrhage may reflect subdural hematoma, measuring up to 12 mm within the right middle cranial fossa, unchanged.  Minimal hyperdensity within the basal ganglia though, there is symmetric associated calcifications.  High right frontal ventriculostomy, no hydrocephalous.  Minimal degenerating blood products layering in the occipital horns. Minimal blood products in the fourth ventricle.  No residual pneumocephalus.  Mild left uncal herniation is similar, similar to 2 mm of left to right midline shift. Slightly effaced basal cisterns.  No acute large vascular territory infarct.  Life support lines in place.  Extensive paranasal sinus opacification.  Left medial orbital blowout fracture in addition to multiple other facial fractures.  Mild fluid density in the mastoid air cells.  IMPRESSION: Extensive supra and infratentorial evolving hemorrhagic contusions, blossoming within the right frontal lobe. Mild left uncal herniation with similar to millimeters of left to right midline shift.  Slightly effaced basal cisterns.  Similar right frontotemporal extra-axial blood products which suggest subdural and or epidural hematomas.  Right frontal ventriculostomy catheter, with degenerating intraventricular blood products, no hydrocephalous.   Original Report Authenticated By: Awilda Metro   Dg Chest [facility]  08/16/2013   CLINICAL DATA:  Respiratory failure  EXAM: PORTABLE CHEST - 1 VIEW  COMPARISON:  08/15/2013  FINDINGS: The cardiac shadow is within normal limits. Endotracheal tube and nasogastric catheter are again identified. The previously seen right-sided chest tube is been removed in the interval. A new right-sided PICC line however is  noted at the cavoatrial junction. The lungs are clear. No recurrent pneumothorax is noted.  IMPRESSION: No evidence of recurrent pneumothorax.  Interval chest tube removal.  PICC line as described.   Electronically Signed   By: Alcide Clever M.D.   On: 08/16/2013 07:15   Dg Chest Port 1 View  08/15/2013   CLINICAL DATA:  Pneumothorax.  EXAM: PORTABLE CHEST - 1 VIEW  COMPARISON:  08/14/2013.  FINDINGS: This intubated patient was difficult to position due to motion during the radiograph.  Right thoracostomy tube remains present. There is no right-sided pneumothorax. Endotracheal tube and enteric tube remain present. No airspace disease is identified. Mild left basilar atelectasis. The pleural line identified on the prior exam is no longer visible at the right apex. There is no visible pneumomediastinum.  IMPRESSION: 1. Stable support apparatus. 2. Right apical pneumothorax no longer visible.   Electronically Signed   By: Andreas Newport M.D.   On: 08/15/2013 07:38   Dg Chest Port 1 View  08/14/2013   CLINICAL DATA:  Trauma, pneumothorax  EXAM: PORTABLE CHEST - 1 VIEW  COMPARISON:  08/12/2013  FINDINGS: Cardiomediastinal silhouette is stable. Stable endotracheal and NG tube position. Tiny pneumomediastinum again noted. Stable right chest tube position. Tiny right apical pneumothorax. No acute infiltrate or pulmonary edema.  IMPRESSION: Stable endotracheal and NG tube position. Tiny pneumomediastinum again noted. Stable right chest tube position. Tiny right apical pneumothorax. No acute infiltrate or pulmonary edema.   Electronically Signed   By: Natasha Mead M.D.   On: 08/14/2013 10:35    Assessment/Plan: Neurologically without change. ICP remained stable. Will DC ICP monitor since ICP has been stable for several days.   Hewitt Shorts, MD 08/16/2013, 8:46 AM

## 2013-08-16 NOTE — Progress Notes (Signed)
Spoke with the patient's mother.  He will need tracheostomy and PEG tomorrow.  Because of his mental status he cannot be extubated safely and therefore a trach and PEG are planned.  I have discussed this plan with the neurosurgeon also.  This patient has been seen and I agree with the findings and treatment plan.  Marta Lamas. Gae Bon, MD, FACS [phone] (pager) [phone] (direct pager) Trauma Surgeon
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
## 2013-08-16 NOTE — Progress Notes (Signed)
Subjective: 3 Days Post-Op Procedure(s) (LRB): OPEN REDUCTION INTERNAL FIXATION (ORIF) HUMERAL SHAFT FRACTURE (Left) OPEN REDUCTION INTERNAL FIXATION (ORIF) TIBIAL PLATEAU (Left) Pt intubated,non responsive,and sedated..    Objective: Vital signs in last 24 hours: Temp:  [99.7 F (37.6 C)-102.2 F (39 C)] 101.5 F (38.6 C) (10/22 1530) Pulse Rate:  [60-102] 94 (10/22 1531) Resp:  [10-22] 16 (10/22 1531) BP: (119-153)/(56-99) 132/99 mmHg (10/22 1500) SpO2:  [99 %-100 %] 100 % (10/22 1531) Arterial Line BP: (136-162)/(61-78) 162/78 mmHg (10/22 1530) FiO2 (%):  [40 %] 40 % (10/22 1531) Weight:  [59 kg (130 lb 1.1 oz)] 59 kg (130 lb 1.1 oz) (10/22 0500)  Intake/Output from previous day: 10/21 0701 - 10/22 0700 In: 4711.8 [I.V.:2864.8; Blood:741.7; NG/GT:945.3; IV Piggyback:160] Out: 4320 [Urine:4320] Intake/Output this shift: Total I/O In: 1888 [I.V.:928; NG/GT:960] Out: 2700 [Urine:2700]   Recent Labs  08/13/13 1659 08/15/13 0530 08/16/13 0406  HGB 8.8* 7.1* 9.8*    Recent Labs  08/15/13 0530 08/16/13 0406  WBC 16.3* 11.5*  RBC 2.23* 3.11*  HCT 20.5* 28.1*  PLT 65* 62*    Recent Labs  08/15/13 1810 08/16/13 0406  NA 156* 151*  K 3.7 3.4*  CL 123* 119*  CO2 27 24  BUN 12 12  CREATININE 0.76 0.71  GLUCOSE 101* 141*  CALCIUM 7.2* 7.4*   Left arm in sling with posterior splint intact. No drainage on dressing. Left leg has intact knee immobilizer. Dressing benign.  Assessment/Plan: 3 Days Post-Op Procedure(s) (LRB): OPEN REDUCTION INTERNAL FIXATION (ORIF) HUMERAL SHAFT FRACTURE (Left) OPEN REDUCTION INTERNAL FIXATION (ORIF) TIBIAL PLATEAU (Left) Plan: Will follow. Cont acute ICU care.  Aldene Hendon G 08/16/2013, 4:20 PM

## 2013-08-16 NOTE — Progress Notes (Signed)
Patient ID: Cory [name], male   DOB: 1981/02/01, 32 y.o.   MRN: [id number]  LOS: 5 days   Subjective: Pt remains intubated and sedated.  Versed titrated off.  Tolerating wean.  Tolerating TF.    Objective: Vital signs in last 24 hours: Temp:  [99.7 F (37.6 C)-102.2 F (39 C)] 99.7 F (37.6 C) (10/22 0900) Pulse Rate:  [60-105] 85 (10/22 0900) Resp:  [9-22] 16 (10/22 0900) BP: (92-153)/(55-85) 129/63 mmHg (10/22 0900) SpO2:  [99 %-100 %] 100 % (10/22 0900) Arterial Line BP: (137-159)/(58-70) 149/68 mmHg (10/22 0900) FiO2 (%):  [40 %] 40 % (10/22 0800) Weight:  [130 lb 1.1 oz (59 kg)] 130 lb 1.1 oz (59 kg) (10/22 0500)    Lab Results:  CBC  Recent Labs  08/15/13 0530 08/16/13 0406  WBC 16.3* 11.5*  HGB 7.1* 9.8*  HCT 20.5* 28.1*  PLT 65* 62*   BMET  Recent Labs  08/15/13 1810 08/16/13 0406  NA 156* 151*  K 3.7 3.4*  CL 123* 119*  CO2 27 24  GLUCOSE 101* 141*  BUN 12 12  CREATININE 0.76 0.71  CALCIUM 7.2* 7.4*    Imaging: Ct Head Wo Contrast  08/15/2013   *RADIOLOGY REPORT*  Clinical Data: Follow-up head injury.  CT HEAD WITHOUT CONTRAST  Technique:  Contiguous axial images were obtained from the base of the skull through the vertex without contrast.  Comparison: CT of the head August 12, 2013  Findings: Again seen are multiple areas of hemorrhagic contusions, largest within the left temporal lobe, 4.85 x 6.2 cm, similar. Surrounding low density vasogenic edema.  Sub centimeter hemorrhagic contusions within the left frontal lobe, including the deep white matter, and right greater left frontal lobe convexities, some of which have blossomed from prior examination. Sub centimeter hyperdensity within the right ventral pons, present previously.  Small right frontal temporal extra-axial hemorrhage may reflect subdural hematoma, measuring up to 12 mm within the right middle cranial fossa, unchanged.  Minimal hyperdensity within the basal ganglia though, there is symmetric  associated calcifications.  High right frontal ventriculostomy, no hydrocephalous.  Minimal degenerating blood products layering in the occipital horns. Minimal blood products in the fourth ventricle.  No residual pneumocephalus.  Mild left uncal herniation is similar, similar to 2 mm of left to right midline shift. Slightly effaced basal cisterns.  No acute large vascular territory infarct.  Life support lines in place.  Extensive paranasal sinus opacification.  Left medial orbital blowout fracture in addition to multiple other facial fractures.  Mild fluid density in the mastoid air cells.  IMPRESSION: Extensive supra and infratentorial evolving hemorrhagic contusions, blossoming within the right frontal lobe. Mild left uncal herniation with similar to millimeters of left to right midline shift.  Slightly effaced basal cisterns.  Similar right frontotemporal extra-axial blood products which suggest subdural and or epidural hematomas.  Right frontal ventriculostomy catheter, with degenerating intraventricular blood products, no hydrocephalous.   Original Report Authenticated By: Awilda Metro   Dg Chest Port 1 View  08/16/2013   CLINICAL DATA:  Respiratory failure  EXAM: PORTABLE CHEST - 1 VIEW  COMPARISON:  08/15/2013  FINDINGS: The cardiac shadow is within normal limits. Endotracheal tube and nasogastric catheter are again identified. The previously seen right-sided chest tube is been removed in the interval. A new right-sided PICC line however is noted at the cavoatrial junction. The lungs are clear. No recurrent pneumothorax is noted.  IMPRESSION: No evidence of recurrent pneumothorax.  Interval chest tube removal.  PICC line as described.   Electronically Signed   By: Alcide Clever M.D.   On: 08/16/2013 07:15   Dg Chest Port 1 View  08/15/2013   CLINICAL DATA:  Pneumothorax.  EXAM: PORTABLE CHEST - 1 VIEW  COMPARISON:  08/14/2013.  FINDINGS: This intubated patient was difficult to position due to  motion during the radiograph.  Right thoracostomy tube remains present. There is no right-sided pneumothorax. Endotracheal tube and enteric tube remain present. No airspace disease is identified. Mild left basilar atelectasis. The pleural line identified on the prior exam is no longer visible at the right apex. There is no visible pneumomediastinum.  IMPRESSION: 1. Stable support apparatus. 2. Right apical pneumothorax no longer visible.   Electronically Signed   By: Andreas Newport M.D.   On: 08/15/2013 07:38   Dg Chest Port 1 View  08/14/2013   CLINICAL DATA:  Trauma, pneumothorax  EXAM: PORTABLE CHEST - 1 VIEW  COMPARISON:  08/12/2013  FINDINGS: Cardiomediastinal silhouette is stable. Stable endotracheal and NG tube position. Tiny pneumomediastinum again noted. Stable right chest tube position. Tiny right apical pneumothorax. No acute infiltrate or pulmonary edema.  IMPRESSION: Stable endotracheal and NG tube position. Tiny pneumomediastinum again noted. Stable right chest tube position. Tiny right apical pneumothorax. No acute infiltrate or pulmonary edema.   Electronically Signed   By: Natasha Mead M.D.   On: 08/14/2013 10:35   Physical Exam: General appearance: sedated, intubated  Head: Normocephalic, without obvious abnormality, atraumatic, right ventriculostomy  Eyes: conjunctivae/corneas clear. PERRL Cardio: regular rate and rhythm, S1, S2 normal, no murmur, click, rub or gallop  GI: soft, non-tender; bowel sounds normal; no masses, no organomegaly  Extremities: left arm dressing is intact, arm in sling.  RLE dressing, bil boots.  Ecchymosis right foot, pulses are +2. Neurologic: not following commands. Withdrew to deep palpation to abdomen x2, no purposeful movements    Assessment/Plan: PHBC  Severe TBI/epidural hematoma, SDH/SAH, intraparenchymal contusion-Dr. Newell Coral following, ICP stable, watch Na, UO increased 200-->381ml per hour, obtain urine osmolality.  NSU to DC ICP monitor   -keep foley for suspicion of DI and vent VDRF -abg with metabolic alkalosis, acid base excess 2. Continue to wean but do not extubate Tibial plateau fracture, left humerus fracture-ORIF Dr. Luiz Blare 10/19 , atbx x48 hours Facial Fractures; frontal sinus, bilateral orbital wall, right maxillary wall, nasal septum-Dr. Suszanne Conners following, will not likely need surgical intervention.  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 H&h 9.8/28.1.  Repeat CBC in AM VTE - SCD's, no lovenox due to TBI  FEN - TF, continue with IVF Dispo -- ICU  I updated his mother who was at bedside.    Ashok Norris, ANP-BC Pager: [phone] General Trauma PA Pager: [phone]   08/16/2013 10:29 AM

## 2013-08-17 ENCOUNTER — Inpatient Hospital Stay (HOSPITAL_COMMUNITY): Payer: Medicaid Other

## 2013-08-17 ENCOUNTER — Encounter (HOSPITAL_COMMUNITY): Admission: EM | Disposition: A | Discharge status: Rehabilitation Facility | Payer: Self-pay | Source: Home / Self Care

## 2013-08-17 ENCOUNTER — Encounter (HOSPITAL_COMMUNITY): Payer: Self-pay | Admitting: Orthopedic Surgery

## 2013-08-17 DIAGNOSIS — E46 Unspecified protein-calorie malnutrition: Secondary | ICD-10-CM

## 2013-08-17 HISTORY — PX: PERCUTANEOUS TRACHEOSTOMY: SHX5288

## 2013-08-17 HISTORY — PX: PEG W/TRACHEOSTOMY PLACEMENT: SHX2188

## 2013-08-17 LAB — GLUCOSE, CAPILLARY
Glucose-Capillary: 104 mg/dL — ABNORMAL HIGH (ref 70–99)
Glucose-Capillary: 105 mg/dL — ABNORMAL HIGH (ref 70–99)
Glucose-Capillary: 112 mg/dL — ABNORMAL HIGH (ref 70–99)
Glucose-Capillary: 130 mg/dL — ABNORMAL HIGH (ref 70–99)

## 2013-08-17 LAB — CBC
MCHC: 34 g/dL (ref 30.0–36.0)
MCV: 91.1 fL (ref 78.0–100.0)
Platelets: 76 10*3/uL — ABNORMAL LOW (ref 150–400)
RBC: 3.26 MIL/uL — ABNORMAL LOW (ref 4.22–5.81)
RDW: 15.1 % (ref 11.5–15.5)
WBC: 16.5 10*3/uL — ABNORMAL HIGH (ref 4.0–10.5)

## 2013-08-17 LAB — BLOOD GAS, ARTERIAL
Acid-Base Excess: 3.1 mmol/L — ABNORMAL HIGH (ref 0.0–2.0)
Drawn by: 312761
FIO2: 0.4 %
MECHVT: 500 mL
O2 Saturation: 99.4 %
PEEP: 5 cmH2O
Patient temperature: 98.6
RATE: 20 resp/min
TCO2: 26.3 mmol/L (ref 0–100)
pCO2 arterial: 28.1 mmHg — ABNORMAL LOW (ref 35.0–45.0)

## 2013-08-17 LAB — BASIC METABOLIC PANEL
CO2: 23 mEq/L (ref 19–32)
Chloride: 113 mEq/L — ABNORMAL HIGH (ref 96–112)
Creatinine, Ser: 0.65 mg/dL (ref 0.50–1.35)
GFR calc Af Amer: >90 mL/min (ref >90)
Glucose, Bld: 119 mg/dL — ABNORMAL HIGH (ref 70–99)
Potassium: 4 mEq/L (ref 3.5–5.1)

## 2013-08-17 SURGERY — INSERTION, PEG TUBE
Anesthesia: Moderate Sedation

## 2013-08-17 SURGERY — CREATION, TRACHEOSTOMY, PERCUTANEOUS
Anesthesia: General | Site: Neck | Wound class: Clean Contaminated

## 2013-08-17 MED ORDER — ACETAMINOPHEN 650 MG RE SUPP
650.0000 mg | Freq: Four times a day (QID) | RECTAL | Status: DC | PRN
  Administered 2013-08-17 – 2013-08-19 (×2): 650 mg via RECTAL
  Filled 2013-08-17 (×3): qty 1

## 2013-08-17 MED ORDER — VANCOMYCIN HCL IN DEXTROSE 750-5 MG/150ML-% IV SOLN
750.0000 mg | Freq: Three times a day (TID) | INTRAVENOUS | Status: DC
  Administered 2013-08-17 – 2013-08-18 (×4): 750 mg via INTRAVENOUS
  Filled 2013-08-17 (×5): qty 150

## 2013-08-17 MED ORDER — LIDOCAINE-EPINEPHRINE (PF) 1.5 %-1:200000 IJ SOLN
INTRAMUSCULAR | Status: DC | PRN
  Administered 2013-08-17: 3.5 mL

## 2013-08-17 MED ORDER — VECURONIUM BROMIDE 10 MG IV SOLR
INTRAVENOUS | Status: AC
  Filled 2013-08-17: qty 10

## 2013-08-17 MED ORDER — PIPERACILLIN-TAZOBACTAM 3.375 G IVPB
3.3750 g | Freq: Three times a day (TID) | INTRAVENOUS | Status: DC
  Administered 2013-08-17 – 2013-08-24 (×22): 3.375 g via INTRAVENOUS
  Filled 2013-08-17 (×23): qty 50

## 2013-08-17 MED ORDER — VECURONIUM BROMIDE 10 MG IV SOLR
10.0000 mg | Freq: Once | INTRAVENOUS | Status: AC
  Administered 2013-08-17: 10 mg via INTRAVENOUS
  Filled 2013-08-17: qty 10

## 2013-08-17 SURGICAL SUPPLY — 23 items
DRAPE PROXIMA HALF (DRAPES) ×6 IMPLANT
DRAPE UTILITY 15X26 W/TAPE STR (DRAPE) ×3 IMPLANT
ELECT CAUTERY BLADE 6.4 (BLADE) ×3 IMPLANT
ELECT REM PT RETURN 9FT ADLT (ELECTROSURGICAL) ×3
ELECTRODE REM PT RTRN 9FT ADLT (ELECTROSURGICAL) ×2 IMPLANT
GAUZE SPONGE 4X4 16PLY XRAY LF (GAUZE/BANDAGES/DRESSINGS) ×3 IMPLANT
GLOVE BIO SURGEON STRL SZ8 (GLOVE) ×3 IMPLANT
GLOVE BIOGEL PI IND STRL 8 (GLOVE) ×4 IMPLANT
GLOVE BIOGEL PI INDICATOR 8 (GLOVE) ×2
GLOVE ECLIPSE 7.5 STRL STRAW (GLOVE) ×6 IMPLANT
GOWN PREVENTION PLUS XLARGE (GOWN DISPOSABLE) ×3 IMPLANT
GOWN STRL NON-REIN LRG LVL3 (GOWN DISPOSABLE) ×6 IMPLANT
INTRODUCER TRACH BLUE RHINO 6F (TUBING) ×3 IMPLANT
INTRODUCER TRACH BLUE RHINO 8F (TUBING) ×3 IMPLANT
NS IRRIG 1000ML POUR BTL (IV SOLUTION) ×3 IMPLANT
PENCIL BUTTON HOLSTER BLD 10FT (ELECTRODE) ×3 IMPLANT
SPONGE DRAIN TRACH 4X4 STRL 2S (GAUZE/BANDAGES/DRESSINGS) ×3 IMPLANT
SPONGE INTESTINAL PEANUT (DISPOSABLE) ×3 IMPLANT
SUT SILK 2 0 SH CR/8 (SUTURE) ×3 IMPLANT
SUT VICRYL AB 3 0 TIES (SUTURE) ×3 IMPLANT
TOWEL OR 17X26 10 PK STRL BLUE (TOWEL DISPOSABLE) ×3 IMPLANT
TUBE CONNECTING 12X1/4 (SUCTIONS) ×3 IMPLANT
YANKAUER SUCT BULB TIP NO VENT (SUCTIONS) ×3 IMPLANT

## 2013-08-17 NOTE — Op Note (Addendum)
OPERATIVE REPORT  DATE OF OPERATION:  08/17/2013  PATIENT:  Cory [name]  32 y.o. male  PRE-OPERATIVE DIAGNOSIS:  unable to extubate to to mental status/malnutrition  POST-OPERATIVE DIAGNOSIS:  unable to extubate to to mental status/malnutrition  PROCEDURE:  Procedure(s): PERCUTANEOUS TRACHEOSTOMY PEG  SURGEON:  Surgeon(s): Cherylynn Ridges, MD  ASSISTANT: [place], NP-C  ANESTHESIA:   IV sedation  EBL: <20 ml  BLOOD ADMINISTERED: none  DRAINS: Urinary Catheter (Foley), Gastrostomy Tube and Tracheostomy   SPECIMEN:  No Specimen  COUNTS CORRECT:  YES  PROCEDURE DETAILS: The procedures were performed at the patient's bedside in the [facility] intensive care unit.  After proper time out was performed identifying the patient and the procedures to be performed, we began with the percutaneous endoscopic gastrostomy tube placement.  The abdominal wall in the left upper quadrant was prepped in the usual sterile manner. Using a Pentax endoscope the oropharynx was cannulated we followed the orogastric tube down the esophagus into the stomach. Pictures were taken of the distal esophagus. Also pictures were taken of the duodenum distal to the pylorus and the stomach after the gastrostomy tube had been placed. The gastroscope was retracted back into the body of the stomach where we could see the impulse of the assistance of finger on the anterior abdominal wall. An incision was made in the abdominal wall then subsequently an angiocatheter passed into the stomach under direct vision. The wire was subsequently passed through the angiocatheter and then brought out through the patient's mouth.  The looped the blue wire was subsequently attached to the pull-through gastrostomy tube which was subsequently pulled back through the patient's mouth. It was then brought to the anterior abdominal wall and then the bolster stopped at the intra-abdominal wall of the stomach. We followed the gastrostomy tube  into the stomach using the gastroscope and psoas placement next is abdominal wall. Does minimal bleeding. It was cinched in place using a bolster coming with the tip. Triple Antibiotic ointment was placed underneath the bolster.  Once the tube was in adequate position we prepped and draped for the tracheostomy placing a towel roll underneath the patient's shoulder transversely.  We anesthetized the area approximately 1 cm superior to the sternal notch. Subsequently a transverse incision was made using a #15 blade and taken down to subcutaneous tissue. We then dissected down to the pretracheal fascia using electrocautery and blunt dissection with a hemostat clamp. Once we got down to the tracheal rings a tracheal hook was placed in the cricoid cartilage. Once this was done we retracted the trachea superiorly as we subsequently back the endotracheal tube more proximally into the trachea. We then accessed the tracheal lumen using an angiocatheter through which we subsequently passed a green wire. Over the great wire we passed a short stubby dilator and then the Blue Rhino dilator up to the black ring of the device. Subsequently a #6 Shiley tracheostomy was passed over the guide into the tracheotomy and secured in place. The internal cannula was passed and we subsequently connected the patient to the ventilator and found there to be adequate volume return.  Once this was done we secured the tracheostomy in place with 4 corner stitches of 3-0 nylon. A tracheal strap was applied. There was one vein that had to be ligated on the right side on anterior jugular vein was minimal bleeding. All counts were correct and chest x-ray is pending.  PATIENT DISPOSITION:  Procedure done at the patient bedside, he was stable throughout  the procedure.       Cherylynn Ridges 10/23/201411:23 AM

## 2013-08-17 NOTE — Progress Notes (Signed)
Subjective: Patient just status post tracheostomy and PEG by trauma surgery. Received extra Versed and fentanyl as well as paralytic agents.  Objective: Vital signs in last 24 hours: Filed Vitals:   08/17/13 0600 08/17/13 0700 08/17/13 0740 08/17/13 0800  BP: 114/57 119/68 167/67 129/76  Pulse: 84 79 65 124  Temp: 100.9 F (38.3 C) 101.5 F (38.6 C)  101.7 F (38.7 C)  TempSrc:      Resp: 20 20 20 19   Height:      Weight:      SpO2: 100% 100% 100% 100%    Intake/Output from previous day: 10/22 0701 - 10/23 0700 In: 5666.5 [I.V.:3486.5; NG/GT:2180] Out: 5925 [Urine:5925] Intake/Output this shift: Total I/O In: 27.5 [I.V.:27.5] Out: 350 [Urine:350]  Physical Exam:  Not opening eyes. No attempt at speech. Pupils about 1 mm bilaterally, round, not reactive to light. Movement of extremities.  CBC  Recent Labs  08/16/13 0406 08/17/13 0530  WBC 11.5* 16.5*  HGB 9.8* 10.1*  HCT 28.1* 29.7*  PLT 62* 76*   BMET  Recent Labs  08/16/13 2130 08/17/13 0530  NA 148* 145  K 3.6 4.0  CL 114* 113*  CO2 26 23  GLUCOSE 141* 119*  BUN 14 14  CREATININE 0.67 0.65  CALCIUM 7.4* 7.7*   ABG    Component Value Date/Time   PHART 7.564* 08/17/2013 0316   PCO2ART 28.1* 08/17/2013 0316   PO2ART 143.0* 08/17/2013 0316   HCO3 25.5* 08/17/2013 0316   TCO2 26.3 08/17/2013 0316   ACIDBASEDEF 7.0* 08/12/2013 0423   O2SAT 99.4 08/17/2013 0316    Studies/Results: Dg Chest Port 1 View  08/16/2013   CLINICAL DATA:  Respiratory failure  EXAM: PORTABLE CHEST - 1 VIEW  COMPARISON:  08/15/2013  FINDINGS: The cardiac shadow is within normal limits. Endotracheal tube and nasogastric catheter are again identified. The previously seen right-sided chest tube is been removed in the interval. A new right-sided PICC line however is noted at the cavoatrial junction. The lungs are clear. No recurrent pneumothorax is noted.  IMPRESSION: No evidence of recurrent pneumothorax.  Interval chest tube  removal.  PICC line as described.   Electronically Signed   By: Alcide Clever M.D.   On: 08/16/2013 07:15    Assessment/Plan: No change in overall neurologic condition.   Hewitt Shorts, MD 08/17/2013, 11:28 AM

## 2013-08-17 NOTE — Progress Notes (Signed)
Follow up - Trauma and Critical Care  Patient Details:    Cory [name] is an 32 y.o. male.  Lines/tubes : Airway 7.5 mm (Active)  Secured at (cm) 22 cm 08/17/2013  3:13 AM  Measured From Lips 08/17/2013  3:13 AM  Secured Location Left 08/17/2013  3:13 AM  Secured By [employer] 08/17/2013  3:13 AM  Tube Holder Repositioned Yes 08/17/2013  3:13 AM  Cuff Pressure (cm H2O) 26 cm H2O 08/16/2013  7:28 PM  Site Condition Dry 08/17/2013  3:13 AM     PICC Triple Lumen 08/15/13 PICC Right Basilic 43 cm 3 cm (Active)  Indication for Insertion or Continuance of Line Prolonged intravenous therapies 08/16/2013  8:00 PM  Exposed Catheter (cm) 3 cm 08/15/2013  9:00 AM  Site Assessment Clean;Dry;Intact 08/16/2013  8:00 PM  Lumen #1 Status Infusing 08/16/2013  8:00 PM  Lumen #2 Status Infusing 08/16/2013  8:00 PM  Lumen #3 Status Flushed;Saline locked;Blood return noted 08/16/2013  8:00 PM  Dressing Type Transparent 08/16/2013  8:00 PM  Dressing Status Clean;Dry;Intact 08/16/2013  8:00 PM  Line Care Connections checked and tightened 08/16/2013  8:00 PM  Dressing Change Due 08/22/13 08/16/2013  8:00 PM     Arterial Line 08/11/13 Right Radial (Active)  Site Assessment Clean;Dry;Intact 08/16/2013  8:00 PM  Line Status Pulsatile blood flow 08/16/2013  8:00 PM  Art Line Waveform Appropriate 08/16/2013  8:00 PM  Art Line Interventions Zeroed and calibrated;Leveled;Connections checked and tightened;Flushed per protocol 08/16/2013  8:00 PM  Color/Movement/Sensation Capillary refill less than 3 sec 08/16/2013  8:00 PM  Dressing Type Transparent 08/16/2013  8:00 PM  Dressing Status Clean;Dry;Intact 08/16/2013  8:00 PM     NG/OG Tube Orogastric Left mouth (Active)  Placement Verification Auscultation 08/17/2013  4:00 AM  Site Assessment Clean;Dry;Intact 08/17/2013  4:00 AM  Status Clamped 08/17/2013  4:00 AM  Drainage Appearance Brown 08/17/2013  4:00 AM  Gastric Residual 20 mL 08/17/2013   4:00 AM  Intake (mL) 300 mL 08/17/2013 12:00 AM  Output (mL) 50 mL 08/13/2013  4:00 AM     Urethral Catheter Double-lumen;Temperature probe 16 Fr. (Active)  Indication for Insertion or Continuance of Catheter Other (comment) 08/16/2013  8:00 PM  Site Assessment Clean;Intact 08/16/2013  8:00 PM  Catheter Maintenance Bag below level of bladder;Catheter secured;Drainage bag/tubing not touching floor;Insertion date on drainage bag;No dependent loops;Seal intact 08/16/2013  8:00 PM  Collection Container Standard drainage bag 08/16/2013  8:00 PM  Securement Method Leg strap 08/16/2013  8:00 PM  Urinary Catheter Interventions Unclamped 08/16/2013  8:00 PM  Output (mL) 290 mL 08/12/2013  6:43 AM    Microbiology/Sepsis markers: Results for orders placed during the hospital encounter of 08/11/13  MRSA PCR SCREENING     Status: None   Collection Time    08/12/13 12:24 AM      Result Value Range Status   MRSA by PCR NEGATIVE  NEGATIVE Final   Comment:            The GeneXpert MRSA Assay (FDA     approved for NASAL specimens     only), is one component of a     comprehensive MRSA colonization     surveillance program. It is not     intended to diagnose MRSA     infection nor to guide or     monitor treatment for     MRSA infections.  CULTURE, RESPIRATORY (NON-EXPECTORATED)     Status: None   Collection Time  08/14/13 11:15 AM      Result Value Range Status   Specimen Description OTHER   Final   Special Requests SPECIMEN OBTAIN BY ET SUCTION   Final   Gram Stain     Final   Value: ABUNDANT WBC PRESENT,BOTH PMN AND MONONUCLEAR     NO SQUAMOUS EPITHELIAL CELLS SEEN     ABUNDANT GRAM POSITIVE COCCI IN PAIRS     MODERATE GRAM NEGATIVE RODS     FEW GRAM POSITIVE RODS   Culture     Final   Value: Culture reincubated for better growth     Performed at [facility]   Report Status PENDING   Incomplete    Anti-infectives:  Anti-infectives   Start     Dose/Rate Route Frequency  Ordered Stop   08/13/13 2200  ceFAZolin (ANCEF) IVPB 2 g/50 mL premix     2 g 100 mL/hr over 30 Minutes Intravenous 3 times per day 08/13/13 1930 08/15/13 1522   08/12/13 0600  ceFAZolin (ANCEF) IVPB 2 g/50 mL premix  Status:  Discontinued     2 g 100 mL/hr over 30 Minutes Intravenous 3 times per day 08/12/13 0047 08/13/13 1930   08/11/13 2245  ceFAZolin (ANCEF) IVPB 2 g/50 mL premix     2 g 100 mL/hr over 30 Minutes Intravenous  Once 08/11/13 2239 08/11/13 2323      Best Practice/Protocols:  VTE Prophylaxis: Mechanical GI Prophylaxis: Proton Pump Inhibitor Continous Sedation  Consults: Treatment Team:  Darletta Moll, MD Hewitt Shorts, MD    Events:  Subjective:    Overnight Issues: Patient has spiked a fever.  Not on antibiotics.  Nothing changed neurologically  Objective:  Vital signs for last 24 hours: Temp:  [99.7 F (37.6 C)-101.7 F (38.7 C)] 101.5 F (38.6 C) (10/23 0700) Pulse Rate:  [69-113] 79 (10/23 0700) Resp:  [11-21] 20 (10/23 0700) BP: (103-142)/(55-99) 119/68 mmHg (10/23 0700) SpO2:  [99 %-100 %] 100 % (10/23 0700) Arterial Line BP: (116-171)/(53-81) 135/60 mmHg (10/23 0700) FiO2 (%):  [40 %] 40 % (10/23 0400) Weight:  [57.8 kg (127 lb 6.8 oz)] 57.8 kg (127 lb 6.8 oz) (10/23 0500)  Hemodynamic parameters for last 24 hours:    Intake/Output from previous day: 10/22 0701 - 10/23 0700 In: 5666.5 [I.V.:3486.5; NG/GT:2180] Out: 5925 [Urine:5925]  Intake/Output this shift:    Vent settings for last 24 hours: Vent Mode:  [-] PRVC FiO2 (%):  [40 %] 40 % Set Rate:  [20 bmp] 20 bmp Vt Set:  [500 mL] 500 mL PEEP:  [5 cmH20] 5 cmH20 Pressure Support:  [5 cmH20] 5 cmH20 Plateau Pressure:  [20 cmH20] 20 cmH20  Physical Exam:  General: no respiratory distress and coughing spontaneously Neuro: RASS -3 or deeper Resp: Mostly clear to auscultation, but occasional wheezing on the right with deep inspiration.  CXR today to be done after rach. GI: soft,  nontender, BS WNL, no r/g and Had tolerated tube feedings up until MN Extremities: no edema, no erythema, pulses WNL and No DVT signs.  Results for orders placed during the hospital encounter of 08/11/13 (from the past 24 hour(s))  GLUCOSE, CAPILLARY     Status: None   Collection Time    08/16/13  8:41 AM      Result Value Range   Glucose-Capillary 78  70 - 99 mg/dL  OSMOLALITY, URINE     Status: Abnormal   Collection Time    08/16/13 11:06 AM  Result Value Range   Osmolality, Ur 317 (*) 390 - 1090 mOsm/kg  GLUCOSE, CAPILLARY     Status: Abnormal   Collection Time    08/16/13  4:15 PM      Result Value Range   Glucose-Capillary 119 (*) 70 - 99 mg/dL   Comment 1 Notify RN     Comment 2 Documented in Chart    GLUCOSE, CAPILLARY     Status: Abnormal   Collection Time    08/16/13  8:26 PM      Result Value Range   Glucose-Capillary 143 (*) 70 - 99 mg/dL   Comment 1 Notify RN     Comment 2 Documented in Chart    BASIC METABOLIC PANEL     Status: Abnormal   Collection Time    08/16/13  9:30 PM      Result Value Range   Sodium 148 (*) 135 - 145 mEq/L   Potassium 3.6  3.5 - 5.1 mEq/L   Chloride 114 (*) 96 - 112 mEq/L   CO2 26  19 - 32 mEq/L   Glucose, Bld 141 (*) 70 - 99 mg/dL   BUN 14  6 - 23 mg/dL   Creatinine, Ser 2.13  0.50 - 1.35 mg/dL   Calcium 7.4 (*) 8.4 - 10.5 mg/dL   GFR calc non Af Amer >90  >90 mL/min   GFR calc Af Amer >90  >90 mL/min  GLUCOSE, CAPILLARY     Status: Abnormal   Collection Time    08/17/13 12:47 AM      Result Value Range   Glucose-Capillary 130 (*) 70 - 99 mg/dL  BLOOD GAS, ARTERIAL     Status: Abnormal   Collection Time    08/17/13  3:16 AM      Result Value Range   FIO2 0.40     Delivery systems VENTILATOR     Mode PRESSURE REGULATED VOLUME CONTROL     VT 500     Rate 20     Peep/cpap 5.0     pH, Arterial 7.564 (*) 7.350 - 7.450   pCO2 arterial 28.1 (*) 35.0 - 45.0 mmHg   pO2, Arterial 143.0 (*) 80.0 - 100.0 mmHg   Bicarbonate  25.5 (*) 20.0 - 24.0 mEq/L   TCO2 26.3  0 - 100 mmol/L   Acid-Base Excess 3.1 (*) 0.0 - 2.0 mmol/L   O2 Saturation 99.4     Patient temperature 98.6     Collection site A-LINE     Drawn by [id number]     Sample type ARTERIAL DRAW     Allens test (pass/fail) PASS  PASS  GLUCOSE, CAPILLARY     Status: Abnormal   Collection Time    08/17/13  4:37 AM      Result Value Range   Glucose-Capillary 112 (*) 70 - 99 mg/dL  CBC     Status: Abnormal   Collection Time    08/17/13  5:30 AM      Result Value Range   WBC 16.5 (*) 4.0 - 10.5 K/uL   RBC 3.26 (*) 4.22 - 5.81 MIL/uL   Hemoglobin 10.1 (*) 13.0 - 17.0 g/dL   HCT 46.9 (*) 62.9 - 52.8 %   MCV 91.1  78.0 - 100.0 fL   MCH 31.0  26.0 - 34.0 pg   MCHC 34.0  30.0 - 36.0 g/dL   RDW 41.3  24.4 - 01.0 %   Platelets 76 (*) 150 - 400 K/uL  BASIC METABOLIC  PANEL     Status: Abnormal   Collection Time    08/17/13  5:30 AM      Result Value Range   Sodium 145  135 - 145 mEq/L   Potassium 4.0  3.5 - 5.1 mEq/L   Chloride 113 (*) 96 - 112 mEq/L   CO2 23  19 - 32 mEq/L   Glucose, Bld 119 (*) 70 - 99 mg/dL   BUN 14  6 - 23 mg/dL   Creatinine, Ser 1.61  0.50 - 1.35 mg/dL   Calcium 7.7 (*) 8.4 - 10.5 mg/dL   GFR calc non Af Amer >90  >90 mL/min   GFR calc Af Amer >90  >90 mL/min  GLUCOSE, CAPILLARY     Status: Abnormal   Collection Time    08/17/13  7:40 AM      Result Value Range   Glucose-Capillary 104 (*) 70 - 99 mg/dL     Assessment/Plan:   NEURO  Altered Mental Status:  obtundation, coma and sedation   Plan: CPM  PULM  Atelectasis/collapse (focal and Right lung where chest tuew is in place.  No air leak)   Plan: CPM.  PaCO2 was low and patient with RR 20.  Will decrease rate to 16.  CARDIO  No issues   Plan: CPM  RENAL  Positive fluid balance, but urine output good.   Plan: CPM  GI  No issues.     Plan: Will restart tube feedings after PEG is placed.  ID  Pneumonia (hospital acquired (not ventilator-associated) Has fever and  increased WBC.  Sputum culture shows GPC and GNR in abundance.  Will start empiric antibiotic for coverage)   Plan: Start Vanco and Zosyn empirically  HEME  Anemia acute blood loss anemia and anemia of critical illness)   Plan: No blood needed for now.  ENDO No specific issues   Plan: CPM  Global Issues  Patient unchanged.  Neurologically not doing well.  DI seems not to be a problem.  For tracheostomy and G-tube today.    LOS: 6 days   Additional comments:I reviewed the patient's new clinical lab test results. cbc/bmet and I reviewed the patients new imaging test results. cxr  Critical Care Total Time*: 30 Minutes  Johnmark Geiger O 08/17/2013  *Care during the described time interval was provided by me and/or other providers on the critical care team.  I have reviewed this patient's available data, including medical history, events of note, physical examination and test results as part of my evaluation.
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
## 2013-08-17 NOTE — Progress Notes (Signed)
Patient ID: Cory [name], male   DOB: 07-Jan-1981, 32 y.o.   MRN: [id number]  PEG tube and tracheostomy placed at bedside. Obtain a start chest x ray to ensure proper placement.  He remained stable throughout.  Formal procedure note to follow.  May have meds per PEG tube and plan to start tube feeds in the morning.  Syanna Remmert, ANP-BC

## 2013-08-17 NOTE — Progress Notes (Signed)
Subjective: 4 Days Post-Op Procedure(s) (LRB): OPEN REDUCTION INTERNAL FIXATION (ORIF) HUMERAL SHAFT FRACTURE (Left) OPEN REDUCTION INTERNAL FIXATION (ORIF) TIBIAL PLATEAU (Left) Patient reports pain as pt iontubated and sedated not able to give history.    Objective: Vital signs in last 24 hours: Temp:  [99.7 F (37.6 C)-101.7 F (38.7 C)] 101.5 F (38.6 C) (10/23 0700) Pulse Rate:  [69-113] 79 (10/23 0700) Resp:  [11-21] 20 (10/23 0700) BP: (103-142)/(55-99) 119/68 mmHg (10/23 0700) SpO2:  [99 %-100 %] 100 % (10/23 0700) Arterial Line BP: (116-171)/(53-81) 135/60 mmHg (10/23 0700) FiO2 (%):  [40 %] 40 % (10/23 0400) Weight:  [57.8 kg (127 lb 6.8 oz)] 57.8 kg (127 lb 6.8 oz) (10/23 0500)  Intake/Output from previous day: 10/22 0701 - 10/23 0700 In: 5666.5 [I.V.:3486.5; NG/GT:2180] Out: 5925 [Urine:5925] Intake/Output this shift:     Recent Labs  08/15/13 0530 08/16/13 0406 08/17/13 0530  HGB 7.1* 9.8* 10.1*    Recent Labs  08/16/13 0406 08/17/13 0530  WBC 11.5* 16.5*  RBC 3.11* 3.26*  HCT 28.1* 29.7*  PLT 62* 76*    Recent Labs  08/16/13 2130 08/17/13 0530  NA 148* 145  K 3.6 4.0  CL 114* 113*  CO2 26 23  BUN 14 14  CREATININE 0.67 0.65  GLUCOSE 141* 119*  CALCIUM 7.4* 7.7*   No results found for this basename: LABPT, INR,  in the last 72 hours  Compartment soft  Assessment/Plan: 4 Days Post-Op Procedure(s) (LRB): OPEN REDUCTION INTERNAL FIXATION (ORIF) HUMERAL SHAFT FRACTURE (Left) OPEN REDUCTION INTERNAL FIXATION (ORIF) TIBIAL PLATEAU (Left) Up with therapy Cont bedrest and splinting Kyarah Enamorado L 08/17/2013, 7:43 AM

## 2013-08-17 NOTE — Progress Notes (Signed)
Orthopedic Tech Progress Note Patient Details:  Cory [name] 12-18-1980 [id number]  Ortho Devices Type of Ortho Device: Abdominal binder Ortho Device/Splint Location: Bil. Prafo Ortho Device/Splint Interventions: Casandra Doffing 08/17/2013, 11:59 AM

## 2013-08-17 NOTE — Progress Notes (Signed)
Pt not weaned at this time due to having trach placed later this AM and he is sedated at the time. No complications noted. RT Will monitor.

## 2013-08-18 ENCOUNTER — Inpatient Hospital Stay (HOSPITAL_COMMUNITY): Payer: Medicaid Other

## 2013-08-18 ENCOUNTER — Encounter (HOSPITAL_COMMUNITY): Payer: Self-pay | Admitting: General Surgery

## 2013-08-18 DIAGNOSIS — S42412K Displaced simple supracondylar fracture without intercondylar fracture of left humerus, subsequent encounter for fracture with nonunion: Secondary | ICD-10-CM | POA: Diagnosis present

## 2013-08-18 DIAGNOSIS — J189 Pneumonia, unspecified organism: Secondary | ICD-10-CM

## 2013-08-18 LAB — CBC WITH DIFFERENTIAL/PLATELET
Basophils Relative: 0 % (ref 0–1)
Eosinophils Absolute: 0 10*3/uL (ref 0.0–0.7)
Eosinophils Relative: 0 % (ref 0–5)
Hemoglobin: 10.3 g/dL — ABNORMAL LOW (ref 13.0–17.0)
Lymphs Abs: 0.6 10*3/uL — ABNORMAL LOW (ref 0.7–4.0)
MCH: 31.5 pg (ref 26.0–34.0)
MCV: 91.1 fL (ref 78.0–100.0)
Monocytes Relative: 8 % (ref 3–12)
Neutro Abs: 10.8 10*3/uL — ABNORMAL HIGH (ref 1.7–7.7)
Neutrophils Relative %: 87 % — ABNORMAL HIGH (ref 43–77)
RBC: 3.27 MIL/uL — ABNORMAL LOW (ref 4.22–5.81)
RDW: 15 % (ref 11.5–15.5)

## 2013-08-18 LAB — CULTURE, RESPIRATORY

## 2013-08-18 LAB — BASIC METABOLIC PANEL
BUN: 13 mg/dL (ref 6–23)
Calcium: 7.2 mg/dL — ABNORMAL LOW (ref 8.4–10.5)
Creatinine, Ser: 0.68 mg/dL (ref 0.50–1.35)
GFR calc Af Amer: >90 mL/min (ref >90)
GFR calc non Af Amer: >90 mL/min (ref >90)
Glucose, Bld: 139 mg/dL — ABNORMAL HIGH (ref 70–99)
Potassium: 3.6 mEq/L (ref 3.5–5.1)
Sodium: 140 mEq/L (ref 135–145)

## 2013-08-18 LAB — GLUCOSE, CAPILLARY
Glucose-Capillary: 114 mg/dL — ABNORMAL HIGH (ref 70–99)
Glucose-Capillary: 121 mg/dL — ABNORMAL HIGH (ref 70–99)
Glucose-Capillary: 157 mg/dL — ABNORMAL HIGH (ref 70–99)

## 2013-08-18 LAB — URINE MICROSCOPIC-ADD ON

## 2013-08-18 LAB — URINALYSIS, ROUTINE W REFLEX MICROSCOPIC
Bilirubin Urine: NEGATIVE
Glucose, UA: NEGATIVE mg/dL
Protein, ur: 30 mg/dL — AB
Specific Gravity, Urine: 1.02 (ref 1.005–1.030)
Urobilinogen, UA: 1 mg/dL (ref 0.0–1.0)

## 2013-08-18 LAB — CULTURE, RESPIRATORY W GRAM STAIN

## 2013-08-18 MED ORDER — PIVOT 1.5 CAL PO LIQD
1000.0000 mL | ORAL | Status: DC
  Filled 2013-08-18 (×2): qty 1000

## 2013-08-18 MED ORDER — PANTOPRAZOLE SODIUM 40 MG PO PACK
40.0000 mg | PACK | Freq: Every day | ORAL | Status: DC
  Administered 2013-08-18 – 2013-08-29 (×12): 40 mg
  Filled 2013-08-18 (×13): qty 20

## 2013-08-18 MED ORDER — METOCLOPRAMIDE HCL 5 MG/ML IJ SOLN
5.0000 mg | Freq: Four times a day (QID) | INTRAMUSCULAR | Status: DC
  Administered 2013-08-18 – 2013-08-22 (×15): 5 mg via INTRAVENOUS
  Administered 2013-08-22: 12:00:00 via INTRAVENOUS
  Administered 2013-08-22 – 2013-08-25 (×10): 5 mg via INTRAVENOUS
  Filled 2013-08-18 (×31): qty 1
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
## 2013-08-18 NOTE — Progress Notes (Signed)
Subjective: Pt is still on ventilator via trach.  Not responsive.  Objective: Vital signs in last 24 hours: Temp:  [99.7 F (37.6 C)-102.7 F (39.3 C)] 101.3 F (38.5 C) (10/24 1400) Pulse Rate:  [57-127] 123 (10/24 1400) Resp:  [16-32] 32 (10/24 1400) BP: (89-149)/(48-89) 136/89 mmHg (10/24 1400) SpO2:  [96 %-100 %] 100 % (10/24 1400) Arterial Line BP: (105-167)/(47-83) 160/83 mmHg (10/24 1400) FiO2 (%):  [30 %] 30 % (10/24 1400)  Physical Exam  Constitutional: The patient is sedated, on vent, and nonresponsive.  Head: Normocephalic. No obvious bony step-off is noted. Right periorbital ecchymosis and edema have resolved.  Pupils are pinpoint and non-reactive.  Mouth/Throat: Normal mucosa. No bleeding or lesion.  Neck: No tracheal deviation present. Trach in place. Skin: Skin is warm and dry.    Recent Labs  08/17/13 0530 08/18/13 1150  WBC 16.5* 12.3*  HGB 10.1* 10.3*  HCT 29.7* 29.8*  PLT 76* 111*    Recent Labs  08/17/13 0530 08/18/13 1150  NA 145 140  K 4.0 3.6  CL 113* 104  CO2 23 24  GLUCOSE 119* 139*  BUN 14 13  CREATININE 0.65 0.68  CALCIUM 7.7* 7.2*    Medications:  I have reviewed the patient's current medications. Scheduled: . antiseptic oral rinse  15 mL Mouth Rinse QID  . chlorhexidine  15 mL Mouth Rinse BID  . [START ON 08/19/2013] feeding supplement (PIVOT 1.5 CAL)  1,000 mL Per Tube Q24H  . free water  300 mL Per Tube Q4H  . midazolam  2-4 mg Intravenous Once  . pantoprazole sodium  40 mg Per Tube Daily  . piperacillin-tazobactam (ZOSYN)  IV  3.375 g Intravenous Q8H  . povidone-iodine   Topical Once  . sodium chloride  10-40 mL Intracatheter Q12H   [id number] (TYLENOL) oral liquid 160 mg/5 mL, acetaminophen, fentaNYL, fentaNYL, midazolam, midazolam, ondansetron (ZOFRAN) IV, ondansetron, sodium chloride  Assessment/Plan: Pt is still non-responsive.  Extensive facial fractures, involving the frontal sinus, bilateral orbital walls,  the right maxillary walls, and the nasal septum. Most of the patient's facial fractures are nondisplaced. It is likely that the patient will not need any surgical intervention. I will reassess the patient's functional and visual status after he is awake and off vent.    LOS: 7 days   Deshane Cotroneo,SUI W 08/18/2013, 2:04 PM

## 2013-08-18 NOTE — Progress Notes (Signed)
Follow up - Trauma and Critical Care  Patient Details:    Cory [name] is an 32 y.o. male.  Lines/tubes : PICC Triple Lumen 08/15/13 PICC Right Basilic 43 cm 3 cm (Active)  Indication for Insertion or Continuance of Line Prolonged intravenous therapies 08/18/2013  9:00 AM  Exposed Catheter (cm) 3 cm 08/15/2013  9:00 AM  Site Assessment Clean;Dry;Intact 08/18/2013  9:00 AM  Lumen #1 Status Infusing 08/18/2013  9:00 AM  Lumen #2 Status Infusing 08/18/2013  9:00 AM  Lumen #3 Status Capped (Central line) 08/18/2013  9:00 AM  Dressing Type Transparent 08/18/2013  9:00 AM  Dressing Status Clean;Dry;Intact 08/18/2013  9:00 AM  Line Care Connections checked and tightened 08/18/2013  9:00 AM  Dressing Change Due 08/22/13 08/17/2013  8:00 PM     Arterial Line 08/11/13 Right Radial (Active)  Site Assessment Clean;Dry;Intact 08/18/2013  9:00 AM  Line Status Pulsatile blood flow 08/18/2013  9:00 AM  Art Line Waveform Appropriate 08/18/2013  9:00 AM  Art Line Interventions Zeroed and calibrated;Leveled 08/18/2013  9:00 AM  Color/Movement/Sensation Capillary refill less than 3 sec 08/18/2013  9:00 AM  Dressing Type Transparent 08/18/2013  9:00 AM  Dressing Status Clean;Dry;Intact 08/18/2013  9:00 AM     Gastrostomy/Enterostomy Percutaneous endoscopic gastrostomy (PEG) (Active)  Surrounding Skin Intact 08/18/2013  8:00 AM  Tube Status Clamped 08/18/2013  8:00 AM  Dressing Status Clean;Intact;Dry 08/18/2013  8:00 AM  Dressing Intervention Dressing reinforced 08/17/2013  8:00 PM  Dressing Type Abdominal Binder;Split gauze 08/18/2013  8:00 AM     Urethral Catheter Double-lumen;Temperature probe 16 Fr. (Active)  Indication for Insertion or Continuance of Catheter Other (comment) 08/18/2013  8:00 AM  Site Assessment Clean;Intact 08/18/2013  8:00 AM  Catheter Maintenance Bag below level of bladder;Catheter secured;Drainage bag/tubing not touching floor;Insertion date on drainage bag;No dependent  loops;Seal intact 08/18/2013  8:00 AM  Collection Container Standard drainage bag 08/18/2013  8:00 AM  Securement Method Leg strap 08/18/2013  8:00 AM  Urinary Catheter Interventions Unclamped 08/17/2013  8:00 PM  Output (mL) 290 mL 08/12/2013  6:43 AM    Microbiology/Sepsis markers: Results for orders placed during the hospital encounter of 08/11/13  MRSA PCR SCREENING     Status: None   Collection Time    08/12/13 12:24 AM      Result Value Range Status   MRSA by PCR NEGATIVE  NEGATIVE Final   Comment:            The GeneXpert MRSA Assay (FDA     approved for NASAL specimens     only), is one component of a     comprehensive MRSA colonization     surveillance program. It is not     intended to diagnose MRSA     infection nor to guide or     monitor treatment for     MRSA infections.  CULTURE, RESPIRATORY (NON-EXPECTORATED)     Status: None   Collection Time    08/14/13 11:15 AM      Result Value Range Status   Specimen Description OTHER   Final   Special Requests SPECIMEN OBTAIN BY ET SUCTION   Final   Gram Stain     Final   Value: ABUNDANT WBC PRESENT,BOTH PMN AND MONONUCLEAR     NO SQUAMOUS EPITHELIAL CELLS SEEN     ABUNDANT GRAM POSITIVE COCCI IN PAIRS     MODERATE GRAM NEGATIVE RODS     FEW GRAM POSITIVE RODS   Culture  Final   Value: FEW STREPTOCOCCUS,BETA HEMOLYIC NOT GROUP A     Performed at [facility]   Report Status 08/18/2013 FINAL   Final    Anti-infectives:  Anti-infectives   Start     Dose/Rate Route Frequency Ordered Stop   08/17/13 1100  piperacillin-tazobactam (ZOSYN) IVPB 3.375 g     3.375 g 12.5 mL/hr over 240 Minutes Intravenous Every 8 hours 08/17/13 0938     08/17/13 1000  vancomycin (VANCOCIN) IVPB 750 mg/150 ml premix  Status:  Discontinued     750 mg 150 mL/hr over 60 Minutes Intravenous Every 8 hours 08/17/13 0936 08/18/13 1014   08/13/13 2200  ceFAZolin (ANCEF) IVPB 2 g/50 mL premix     2 g 100 mL/hr over 30 Minutes  Intravenous 3 times per day 08/13/13 1930 08/15/13 1522   08/12/13 0600  ceFAZolin (ANCEF) IVPB 2 g/50 mL premix  Status:  Discontinued     2 g 100 mL/hr over 30 Minutes Intravenous 3 times per day 08/12/13 0047 08/13/13 1930   08/11/13 2245  ceFAZolin (ANCEF) IVPB 2 g/50 mL premix     2 g 100 mL/hr over 30 Minutes Intravenous  Once 08/11/13 2239 08/11/13 2323      Best Practice/Protocols:  VTE Prophylaxis: Mechanical GI Prophylaxis: Proton Pump Inhibitor Continous Sedation  Consults: Treatment Team:  Darletta Moll, MD Hewitt Shorts, MD    Events:  Subjective:    Overnight Issues: Spiking fevers.  Nothing being done spontaneously.  Not folowing commands.  Objective:  Vital signs for last 24 hours: Temp:  [99.7 F (37.6 C)-102.2 F (39 C)] 102 F (38.9 C) (10/24 0900) Pulse Rate:  [57-136] 115 (10/24 0900) Resp:  [16-30] 29 (10/24 0900) BP: (84-149)/(48-84) 134/80 mmHg (10/24 0900) SpO2:  [96 %-100 %] 98 % (10/24 0900) Arterial Line BP: (100-195)/(47-93) 146/63 mmHg (10/24 0900) FiO2 (%):  [30 %] 30 % (10/24 0900)  Hemodynamic parameters for last 24 hours:    Intake/Output from previous day: 10/23 0701 - 10/24 0700 In: 3369.6 [I.V.:2769.6; IV Piggyback:600] Out: 2950 [Urine:2950]  Intake/Output this shift: Total I/O In: 411 [I.V.:111; NG/GT:300] Out: 250 [Urine:250]  Vent settings for last 24 hours: Vent Mode:  [-] PSV FiO2 (%):  [30 %] 30 % Set Rate:  [16 bmp-20 bmp] 16 bmp Vt Set:  [500 mL] 500 mL PEEP:  [5 cmH20] 5 cmH20 Pressure Support:  [10 cmH20] 10 cmH20 Plateau Pressure:  [20 cmH20-23 cmH20] 22 cmH20  Physical Exam:  General: no respiratory distress and does not appear to be in distress Neuro: RASS -3 or deeper, weakness right upper extremity and postures Resp: clear to auscultation bilaterally and CXR is pending.  Secretions are less.   CVS: Tachycardia GI: Soft, somewhat distended, tolerating tube feedings Extremities: no edema, no  erythema, pulses WNL  Results for orders placed during the hospital encounter of 08/11/13 (from the past 24 hour(s))  GLUCOSE, CAPILLARY     Status: Abnormal   Collection Time    08/17/13 11:51 AM      Result Value Range   Glucose-Capillary 114 (*) 70 - 99 mg/dL  GLUCOSE, CAPILLARY     Status: Abnormal   Collection Time    08/17/13  3:52 PM      Result Value Range   Glucose-Capillary 105 (*) 70 - 99 mg/dL  GLUCOSE, CAPILLARY     Status: Abnormal   Collection Time    08/17/13  8:17 PM      Result  Value Range   Glucose-Capillary 129 (*) 70 - 99 mg/dL   Comment 1 Notify RN     Comment 2 Documented in Chart    GLUCOSE, CAPILLARY     Status: Abnormal   Collection Time    08/17/13 11:59 PM      Result Value Range   Glucose-Capillary 114 (*) 70 - 99 mg/dL   Comment 1 Notify RN     Comment 2 Documented in Chart    GLUCOSE, CAPILLARY     Status: Abnormal   Collection Time    08/18/13  5:49 AM      Result Value Range   Glucose-Capillary 121 (*) 70 - 99 mg/dL  GLUCOSE, CAPILLARY     Status: Abnormal   Collection Time    08/18/13  7:33 AM      Result Value Range   Glucose-Capillary 131 (*) 70 - 99 mg/dL     Assessment/Plan:   NEURO  Altered Mental Status:  coma   Plan: Wean sedation as tolerated.  PULM  Atelectasis/collapse (focal)   Plan: Ventilate  CARDIO  Sinus Tachycardia   Plan: No specific treatment  RENAL  Urine output has been good.   Plan: CPM  GI  Has new G-tube.  Restarted tube feedings.   Plan: Restart tube feedings.  ID  Pneumonia (hospital acquired (not ventilator-associated) Strep PNA)   Plan: Stop Vanco, keep Zosyn until more specific sensitivities are back  HEME  Anemia acute blood loss anemia and anemia of critical illness)   Plan: No blood for now.  ENDO No specific issues   Plan: CPM  Global Issues  Neurologically the patient is unchanged.  Has fevers of unknown origin, even though his respiratory culture is +, the level of infection does not  seem to explain his rising WBC or his fever.  Will culture urine    LOS: 7 days   Additional comments:I reviewed the patient's new clinical lab test results. cbc/bmet and I reviewed the patients new imaging test results. cxr  Critical Care Total Time*: 45 Minutes  Gustabo Gordillo O 08/18/2013  *Care during the described time interval was provided by me and/or other providers on the critical care team.  I have reviewed this patient's available data, including medical history, events of note, physical examination and test results as part of my evaluation.

## 2013-08-18 NOTE — Clinical Social Work Note (Signed)
Clinical Social Worker continuing to follow patient and family for support and discharge planning needs.  No family currently present at bedside.  Patient has had tracheostomy and PEG placed and is weaning on the ventilator.  Per PT, patient is a Rancho Level II - recommendations pending patient progress.  CSW remains available for support and to assist with facilitating patient discharge needs once medically appropriate.  [name], [place] [phone]

## 2013-08-18 NOTE — Progress Notes (Signed)
Subjective: No changes. Still intubated and unresponsive.  Objective: Vital signs in last 24 hours: Temp:  [99.7 F (37.6 C)-102.7 F (39.3 C)] 102 F (38.9 C) (10/24 1200) Pulse Rate:  [57-127] 127 (10/24 1200) Resp:  [16-30] 28 (10/24 1200) BP: (89-149)/(48-84) 142/74 mmHg (10/24 1200) SpO2:  [96 %-100 %] 98 % (10/24 1200) Arterial Line BP: (105-167)/(47-75) 136/70 mmHg (10/24 1200) FiO2 (%):  [30 %] 30 % (10/24 1200)  Intake/Output from previous day: 10/23 0701 - 10/24 0700 In: 3369.6 [I.V.:2769.6; IV Piggyback:600] Out: 2950 [Urine:2950] Intake/Output this shift: Total I/O In: 1411 [I.V.:551; NG/GT:660; IV Piggyback:200] Out: 725 [Urine:725]   Recent Labs  08/16/13 0406 08/17/13 0530 08/18/13 1150  HGB 9.8* 10.1* 10.3*    Recent Labs  08/17/13 0530 08/18/13 1150  WBC 16.5* 12.3*  RBC 3.26* 3.27*  HCT 29.7* 29.8*  PLT 76* 111*    Recent Labs  08/16/13 2130 08/17/13 0530  NA 148* 145  K 3.6 4.0  CL 114* 113*  CO2 26 23  BUN 14 14  CREATININE 0.67 0.65  GLUCOSE 141* 119*  CALCIUM 7.4* 7.7*   Long arm posterior splint intact to left upper ext. Moderate swelling in left hand. Knee immobilizer in place on left leg.   Assessment/Plan: 5 days s/p orif left open humerus fx 5 days s/p orif left tibial plateau fx Plan: Cont current tx plan Dr Mack Hook on call for group over weekend.   Cory [name] Cory [name], Cory [name]

## 2013-08-18 NOTE — Progress Notes (Signed)
Subjective: Patient continues on vent tracheostomy. Sedated with Versed and fentanyl.  Objective: Vital signs in last 24 hours: Filed Vitals:   08/18/13 0400 08/18/13 0500 08/18/13 0600 08/18/13 0700  BP: 108/82 103/63 119/84 109/61  Pulse: 109 90 116 74  Temp: 99.9 F (37.7 C) 99.7 F (37.6 C) 99.9 F (37.7 C) 100.6 F (38.1 C)  TempSrc: Core (Comment)     Resp: 19 17 19    Height:      Weight:      SpO2: 99% 99% 98% 99%    Intake/Output from previous day: 10/23 0701 - 10/24 0700 In: 3369.6 [I.V.:2769.6; IV Piggyback:600] Out: 2950 [Urine:2950] Intake/Output this shift:    Physical Exam:  Not opening eyes to voice or pain. No attempts at speech. Pupils 1 mm, round, not reactive to light.  CBC  Recent Labs  08/16/13 0406 08/17/13 0530  WBC 11.5* 16.5*  HGB 9.8* 10.1*  HCT 28.1* 29.7*  PLT 62* 76*   BMET  Recent Labs  08/16/13 2130 08/17/13 0530  NA 148* 145  K 3.6 4.0  CL 114* 113*  CO2 26 23  GLUCOSE 141* 119*  BUN 14 14  CREATININE 0.67 0.65  CALCIUM 7.4* 7.7*   ABG    Component Value Date/Time   PHART 7.564* 08/17/2013 0316   PCO2ART 28.1* 08/17/2013 0316   PO2ART 143.0* 08/17/2013 0316   HCO3 25.5* 08/17/2013 0316   TCO2 26.3 08/17/2013 0316   ACIDBASEDEF 7.0* 08/12/2013 0423   O2SAT 99.4 08/17/2013 0316    Studies/Results: Dg Chest Port 1 View  08/17/2013   CLINICAL DATA:  Tracheostomy placement.  EXAM: PORTABLE CHEST - 1 VIEW  COMPARISON:  08/16/2013 and 08/15/2013.  FINDINGS: Interval tracheostomy with tip 3.3 cm above the carina. Right arm PICC appears unchanged. The heart size and mediastinal contours are stable. There is increased opacity inferiorly in the right hemithorax suspicious for a right pleural effusion with associated basilar atelectasis. The left lung is clear. There is no pneumothorax.  IMPRESSION: Interval tracheostomy without demonstrated complication. Increased right basilar pulmonary opacity and adjacent right pleural  effusion.   Electronically Signed   By: Roxy Horseman M.D.   On: 08/17/2013 12:42    Assessment/Plan: No significant neurologic change. Continue current supportive care.   Hewitt Shorts, MD 08/18/2013, 7:14 AM
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
## 2013-08-19 ENCOUNTER — Inpatient Hospital Stay (HOSPITAL_COMMUNITY): Payer: Medicaid Other

## 2013-08-19 DIAGNOSIS — J189 Pneumonia, unspecified organism: Secondary | ICD-10-CM

## 2013-08-19 DIAGNOSIS — J9600 Acute respiratory failure, unspecified whether with hypoxia or hypercapnia: Secondary | ICD-10-CM

## 2013-08-19 LAB — BASIC METABOLIC PANEL
CO2: 23 mEq/L (ref 19–32)
Calcium: 7.2 mg/dL — ABNORMAL LOW (ref 8.4–10.5)
Chloride: 105 mEq/L (ref 96–112)
Creatinine, Ser: 0.73 mg/dL (ref 0.50–1.35)
GFR calc non Af Amer: >90 mL/min (ref >90)
Glucose, Bld: 141 mg/dL — ABNORMAL HIGH (ref 70–99)
Potassium: 3.8 mEq/L (ref 3.5–5.1)
Sodium: 140 mEq/L (ref 135–145)

## 2013-08-19 LAB — CBC WITH DIFFERENTIAL/PLATELET
Eosinophils Absolute: 0 10*3/uL (ref 0.0–0.7)
Eosinophils Relative: 0 % (ref 0–5)
Lymphocytes Relative: 5 % — ABNORMAL LOW (ref 12–46)
Lymphs Abs: 0.6 10*3/uL — ABNORMAL LOW (ref 0.7–4.0)
MCV: 90.3 fL (ref 78.0–100.0)
Monocytes Relative: 14 % — ABNORMAL HIGH (ref 3–12)
Neutrophils Relative %: 80 % — ABNORMAL HIGH (ref 43–77)
Platelets: 135 10*3/uL — ABNORMAL LOW (ref 150–400)
RBC: 3.29 MIL/uL — ABNORMAL LOW (ref 4.22–5.81)
WBC: 12 10*3/uL — ABNORMAL HIGH (ref 4.0–10.5)

## 2013-08-19 LAB — URINE CULTURE: Colony Count: NO GROWTH

## 2013-08-19 LAB — GLUCOSE, CAPILLARY
Glucose-Capillary: 138 mg/dL — ABNORMAL HIGH (ref 70–99)
Glucose-Capillary: 114 mg/dL — ABNORMAL HIGH (ref 70–99)

## 2013-08-19 MED ORDER — PIVOT 1.5 CAL PO LIQD
1000.0000 mL | ORAL | Status: DC
  Administered 2013-08-19: 1000 mL
  Filled 2013-08-19 (×3): qty 1000

## 2013-08-19 MED ORDER — PIVOT 1.5 CAL PO LIQD
1000.0000 mL | ORAL | Status: DC
  Administered 2013-08-20 – 2013-08-24 (×3): 1000 mL
  Filled 2013-08-19 (×7): qty 1000

## 2013-08-19 NOTE — Progress Notes (Signed)
2 Days Post-Op  Subjective: Unresponsive, on vent  Objective: Vital signs in last 24 hours: Temp:  [100.5 F (38.1 C)-102.7 F (39.3 C)] 101.9 F (38.8 C) (10/25 0400) Pulse Rate:  [75-130] 96 (10/25 0726) Resp:  [26-34] 26 (10/25 0726) BP: (97-166)/(46-93) 114/55 mmHg (10/25 0726) SpO2:  [96 %-100 %] 99 % (10/25 0726) Arterial Line BP: (108-164)/(53-83) 108/56 mmHg (10/25 0700) FiO2 (%):  [30 %] 30 % (10/25 0726) Weight:  [119 lb 14.9 oz (54.4 kg)] 119 lb 14.9 oz (54.4 kg) (10/25 0331)    Intake/Output from previous day: 10/24 0701 - 10/25 0700 In: 1610 [I.V.:2654; NG/GT:830; IV Piggyback:300] Out: 2195 [Urine:2195] Intake/Output this shift:    General appearance: no distress Resp: diminished breath sounds bibasilar Cardio: tachy rr GI: g tube in place Neuro- no real response to my exam today  Lab Results:   Recent Labs  08/18/13 1150 08/19/13 0500  WBC 12.3* 12.0*  HGB 10.3* 10.4*  HCT 29.8* 29.7*  PLT 111* 135*   BMET  Recent Labs  08/18/13 1150 08/19/13 0500  NA 140 140  K 3.6 3.8  CL 104 105  CO2 24 23  GLUCOSE 139* 141*  BUN 13 14  CREATININE 0.68 0.73  CALCIUM 7.2* 7.2*   PT/INR No results found for this basename: LABPROT, INR,  in the last 72 hours ABG  Recent Labs  08/17/13 0316  PHART 7.564*  HCO3 25.5*    Studies/Results: Dg Chest Port 1 View  08/19/2013   CLINICAL DATA:  Fever  EXAM: PORTABLE CHEST - 1 VIEW  COMPARISON:  August 18, 2013  FINDINGS: The tracheostomy is well seated. Central catheter tip is in the right atrium. No pneumothorax.  There is subsegmental atelectasis in the left base. The lungs are otherwise clear. Heart size and pulmonary vascularity are normal. No adenopathy.  IMPRESSION: Mild left base atelectasis. Tube and catheter positions as described without pneumothorax. No consolidation.   Electronically Signed   By: Bretta Bang M.D.   On: 08/19/2013 07:38   Dg Chest Port 1 View  08/18/2013   CLINICAL  DATA:  32 year old male with fever, pneumonia. Subsequent encounter.  EXAM: PORTABLE CHEST - 1 VIEW  COMPARISON:  08/17/2013 and earlier.  FINDINGS: Portable AP semi upright view at at 1047 hrs. Stable tracheostomy tube. Stable right PICC line. Interval improved right lung base ventilation. Stable left lung. No pneumothorax or edema. No pleural effusion or consolidation.  IMPRESSION: Interval improved right lung base ventilation. No residual consolidation or pleural effusion identified.   Electronically Signed   By: Augusto Gamble M.D.   On: 08/18/2013 11:18   Dg Chest Port 1 View  08/17/2013   CLINICAL DATA:  Tracheostomy placement.  EXAM: PORTABLE CHEST - 1 VIEW  COMPARISON:  08/16/2013 and 08/15/2013.  FINDINGS: Interval tracheostomy with tip 3.3 cm above the carina. Right arm PICC appears unchanged. The heart size and mediastinal contours are stable. There is increased opacity inferiorly in the right hemithorax suspicious for a right pleural effusion with associated basilar atelectasis. The left lung is clear. There is no pneumothorax.  IMPRESSION: Interval tracheostomy without demonstrated complication. Increased right basilar pulmonary opacity and adjacent right pleural effusion.   Electronically Signed   By: Roxy Horseman M.D.   On: 08/17/2013 12:42    Anti-infectives: Anti-infectives   Start     Dose/Rate Route Frequency Ordered Stop   08/17/13 1100  piperacillin-tazobactam (ZOSYN) IVPB 3.375 g     3.375 g 12.5 mL/hr over 240  Minutes Intravenous Every 8 hours 08/17/13 0938     08/17/13 1000  vancomycin (VANCOCIN) IVPB 750 mg/150 ml premix  Status:  Discontinued     750 mg 150 mL/hr over 60 Minutes Intravenous Every 8 hours 08/17/13 0936 08/18/13 1014   08/13/13 2200  ceFAZolin (ANCEF) IVPB 2 g/50 mL premix     2 g 100 mL/hr over 30 Minutes Intravenous 3 times per day 08/13/13 1930 08/15/13 1522   08/12/13 0600  ceFAZolin (ANCEF) IVPB 2 g/50 mL premix  Status:  Discontinued     2 g 100 mL/hr  over 30 Minutes Intravenous 3 times per day 08/12/13 0047 08/13/13 1930   08/11/13 2245  ceFAZolin (ANCEF) IVPB 2 g/50 mL premix     2 g 100 mL/hr over 30 Minutes Intravenous  Once 08/11/13 2239 08/11/13 2323      Assessment/Plan: AvP 1. Neuro- severe TBI, prognosis not good 2. CV- tachycardia, ? Source 3. Pulm- appreciate ccm assistance, will cont vent 4. GI- attempt to restart tf at trickle today 5. ID - zosyn for strep pna, wbc elevated with fevers, will check cultures if febrile 6. Scds   [facility] 08/19/2013
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
## 2013-08-19 NOTE — Progress Notes (Signed)
Subjective: No changes. Still intubated and unresponsive.  Objective: Vital signs in last 24 hours: Temp:  [100.5 F (38.1 C)-102.1 F (38.9 C)] 101.3 F (38.5 C) (10/25 0800) Pulse Rate:  [75-134] 115 (10/25 1115) Resp:  [20-36] 25 (10/25 1115) BP: (97-166)/(46-93) 126/78 mmHg (10/25 1100) SpO2:  [96 %-100 %] 100 % (10/25 1115) Arterial Line BP: (108-164)/(56-83) 124/68 mmHg (10/25 1100) FiO2 (%):  [30 %] 30 % (10/25 1115) Weight:  [54.4 kg (119 lb 14.9 oz)] 54.4 kg (119 lb 14.9 oz) (10/25 0331)  Intake/Output from previous day: 10/24 0701 - 10/25 0700 In: 1610 [I.V.:2654; NG/GT:830; IV Piggyback:300] Out: 2195 [Urine:2195] Intake/Output this shift: Total I/O In: 469.3 [I.V.:469.3] Out: 500 [Urine:500]   Recent Labs  08/17/13 0530 08/18/13 1150 08/19/13 0500  HGB 10.1* 10.3* 10.4*    Recent Labs  08/18/13 1150 08/19/13 0500  WBC 12.3* 12.0*  RBC 3.27* 3.29*  HCT 29.8* 29.7*  PLT 111* 135*    Recent Labs  08/18/13 1150 08/19/13 0500  NA 140 140  K 3.6 3.8  CL 104 105  CO2 24 23  BUN 13 14  CREATININE 0.68 0.73  GLUCOSE 139* 141*  CALCIUM 7.2* 7.2*   Long arm posterior splint intact to left upper ext. Moderate swelling in left hand. Knee immobilizer in place on left leg.  Dressing C/D/I externally. L LE compartments not tense, toes warm    Assessment/Plan: 6 days s/p orif left open humerus fx 6 days s/p orif left tibial plateau fx Plan: Cont current tx plan Dr Luiz Blare to resume care on Monday   Cory [name] A. 08/19/2013, 11:28 AM
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
## 2013-08-20 ENCOUNTER — Inpatient Hospital Stay (HOSPITAL_COMMUNITY): Payer: Medicaid Other

## 2013-08-20 LAB — CBC
HCT: 25.2 % — ABNORMAL LOW (ref 39.0–52.0)
Hemoglobin: 8.8 g/dL — ABNORMAL LOW (ref 13.0–17.0)
MCH: 31.9 pg (ref 26.0–34.0)
MCHC: 34.9 g/dL (ref 30.0–36.0)
MCV: 91.3 fL (ref 78.0–100.0)
RDW: 15.3 % (ref 11.5–15.5)

## 2013-08-20 LAB — BASIC METABOLIC PANEL
BUN: 17 mg/dL (ref 6–23)
CO2: 23 mEq/L (ref 19–32)
Calcium: 6.8 mg/dL — ABNORMAL LOW (ref 8.4–10.5)
Chloride: 108 mEq/L (ref 96–112)
Creatinine, Ser: 0.72 mg/dL (ref 0.50–1.35)
Glucose, Bld: 129 mg/dL — ABNORMAL HIGH (ref 70–99)
Potassium: 3.9 mEq/L (ref 3.5–5.1)

## 2013-08-20 LAB — GLUCOSE, CAPILLARY
Glucose-Capillary: 143 mg/dL — ABNORMAL HIGH (ref 70–99)
Glucose-Capillary: 122 mg/dL — ABNORMAL HIGH (ref 70–99)
Glucose-Capillary: 105 mg/dL — ABNORMAL HIGH (ref 70–99)
Glucose-Capillary: 103 mg/dL — ABNORMAL HIGH (ref 70–99)
Glucose-Capillary: 139 mg/dL — ABNORMAL HIGH (ref 70–99)
Glucose-Capillary: 125 mg/dL — ABNORMAL HIGH (ref 70–99)

## 2013-08-20 MED ORDER — VANCOMYCIN HCL IN DEXTROSE 750-5 MG/150ML-% IV SOLN
750.0000 mg | Freq: Three times a day (TID) | INTRAVENOUS | Status: DC
  Administered 2013-08-20 – 2013-08-21 (×5): 750 mg via INTRAVENOUS
  Filled 2013-08-20 (×6): qty 150

## 2013-08-20 NOTE — Progress Notes (Signed)
Orthopedic Tech Progress Note Patient Details:  Cory [name] 1980-11-13 [id number]  Ortho Devices Type of Ortho Device: Knee Immobilizer Ortho Device/Splint Location: lle Ortho Device/Splint Interventions: Application   Cory [name] 08/20/2013, 2:26 PM
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
## 2013-08-20 NOTE — Progress Notes (Signed)
Subjective: Still intubated and not responding to verbal stimuli  Objective: Vital signs in last 24 hours: Temp:  [98.1 F (36.7 C)-102.9 F (39.4 C)] 99.5 F (37.5 C) (10/26 0800) Pulse Rate:  [86-135] 98 (10/26 1000) Resp:  [17-34] 34 (10/26 1000) BP: (94-148)/(44-91) 134/75 mmHg (10/26 1000) SpO2:  [94 %-100 %] 96 % (10/26 1000) Arterial Line BP: (101-147)/(47-72) 147/65 mmHg (10/26 1000) FiO2 (%):  [30 %-35 %] 35 % (10/26 0800) Weight:  [51.4 kg (113 lb 5.1 oz)] 51.4 kg (113 lb 5.1 oz) (10/26 0300)  Intake/Output from previous day: 10/25 0701 - 10/26 0700 In: 2171.3 [I.V.:1426; NG/GT:595.3; IV Piggyback:150] Out: 1810 [Urine:1810] Intake/Output this shift:     Recent Labs  08/18/13 1150 08/19/13 0500 08/20/13 0445  HGB 10.3* 10.4* 8.8*    Recent Labs  08/19/13 0500 08/20/13 0445  WBC 12.0* 14.5*  RBC 3.29* 2.76*  HCT 29.7* 25.2*  PLT 135* 152    Recent Labs  08/19/13 0500 08/20/13 0445  NA 140 143  K 3.8 3.9  CL 105 108  CO2 23 23  BUN 14 17  CREATININE 0.73 0.72  GLUCOSE 141* 129*  CALCIUM 7.2* 6.8*   Long arm posterior splint intact to left upper ext. Moderate swelling in left hand. Knee immobilizer in place on left leg.  Dressing C/D/I externally. Incision clean/dry  L LE compartments not tense, toes warm    Assessment/Plan: 7 days s/p orif left open humerus fx 7 days s/p orif left tibial plateau fx Plan: Cont current tx plan Dr Luiz Blare to resume ortho care on Monday   Shawon Denzer A. 08/20/2013, 10:48 AM

## 2013-08-21 ENCOUNTER — Inpatient Hospital Stay (HOSPITAL_COMMUNITY): Payer: Medicaid Other

## 2013-08-21 LAB — BASIC METABOLIC PANEL
BUN: 13 mg/dL (ref 6–23)
Calcium: 7 mg/dL — ABNORMAL LOW (ref 8.4–10.5)
Chloride: 106 mEq/L (ref 96–112)
Creatinine, Ser: 0.67 mg/dL (ref 0.50–1.35)
GFR calc Af Amer: >90 mL/min (ref >90)
GFR calc non Af Amer: >90 mL/min (ref >90)

## 2013-08-21 LAB — CBC
HCT: 27 % — ABNORMAL LOW (ref 39.0–52.0)
Hemoglobin: 9.7 g/dL — ABNORMAL LOW (ref 13.0–17.0)
MCHC: 34.8 g/dL (ref 30.0–36.0)
Platelets: 179 10*3/uL (ref 150–400)
Platelets: 259 10*3/uL (ref 150–400)
RBC: 3.06 MIL/uL — ABNORMAL LOW (ref 4.22–5.81)
RDW: 15.3 % (ref 11.5–15.5)
WBC: 18.8 10*3/uL — ABNORMAL HIGH (ref 4.0–10.5)
WBC: 22.1 10*3/uL — ABNORMAL HIGH (ref 4.0–10.5)

## 2013-08-21 LAB — GLUCOSE, CAPILLARY
Glucose-Capillary: 110 mg/dL — ABNORMAL HIGH (ref 70–99)
Glucose-Capillary: 116 mg/dL — ABNORMAL HIGH (ref 70–99)
Glucose-Capillary: 117 mg/dL — ABNORMAL HIGH (ref 70–99)
Glucose-Capillary: 124 mg/dL — ABNORMAL HIGH (ref 70–99)
Glucose-Capillary: 124 mg/dL — ABNORMAL HIGH (ref 70–99)
Glucose-Capillary: 159 mg/dL — ABNORMAL HIGH (ref 70–99)

## 2013-08-21 LAB — APTT: aPTT: 29 seconds (ref 24–37)

## 2013-08-21 LAB — PROTIME-INR
INR: 1.15 (ref 0.00–1.49)
Prothrombin Time: 14.5 seconds (ref 11.6–15.2)

## 2013-08-21 MED ORDER — VANCOMYCIN HCL IN DEXTROSE 1-5 GM/200ML-% IV SOLN
1000.0000 mg | Freq: Four times a day (QID) | INTRAVENOUS | Status: DC
  Administered 2013-08-22 – 2013-08-23 (×6): 1000 mg via INTRAVENOUS
  Filled 2013-08-21 (×8): qty 200

## 2013-08-21 NOTE — Clinical Social Work Note (Signed)
Clinical Social Worker continuing to follow patient and family for support and discharge planning needs.  No family currently present at bedside.  Patient was able to wean to trach collar for several hours yesterday with the goal to do the same today.  Per PT/OT, patient continues to remain at a [place] Level II with primitive responses.  CSW will explore the options of SNF placement with patient mother pending respiratory progress.  CSW remains available for support and to facilitate patient discharge needs once medically stable.  [name], [place] [phone]

## 2013-08-21 NOTE — Progress Notes (Signed)
RN called RT and stated that patient's arterial line had become dislodged.  Per MD, RT placed new arterial line.  No complications during procedure.  RT will continue to monitor.

## 2013-08-21 NOTE — Progress Notes (Signed)
UR of chart completed.   Pt is not a candidate for LTACH because he has no insurance coverage.
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
## 2013-08-21 NOTE — Progress Notes (Signed)
Pt's right arm arterial line dislodged while repositioning pt.  Pressure held for approximately 10 minutes until bleeding stopped.  Dry drsg applied.  MD Carolynne Edouard) notified.  Order given for new arterial line to be placed.  RT notified.  Will continue to monitor.
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
## 2013-08-21 NOTE — Progress Notes (Signed)
RN called RT to come look at patient.  Upon arrival, patient had coughed a copious amount of blood from trach.  Would have periods of desats to 91%.  Increased FIO2 to 50%.  Suctioned patient in attempt to clear rest of blood.  Changed suction catheter and HME.  MD notified by RN.  RT will continue to monitor.

## 2013-08-21 NOTE — Progress Notes (Signed)
Subjective: No changes.  Patient on ventilator with tracheostomy.  Objective: Vital signs in last 24 hours: Temp:  [98.4 F (36.9 C)-101.6 F (38.7 C)] 98.4 F (36.9 C) (10/27 0700) Pulse Rate:  [88-130] 127 (10/27 0900) Resp:  [17-36] 32 (10/27 0900) BP: (107-165)/(51-95) 132/87 mmHg (10/27 0900) SpO2:  [95 %-100 %] 98 % (10/27 0900) Arterial Line BP: (111-158)/(54-82) 133/65 mmHg (10/27 0900) FiO2 (%):  [28 %-35 %] 30 % (10/27 0800) Weight:  [53.5 kg (117 lb 15.1 oz)] 53.5 kg (117 lb 15.1 oz) (10/27 0300)  Intake/Output from previous day: 10/26 0701 - 10/27 0700 In: 4004 [I.V.:2294; NG/GT:1110; IV Piggyback:600] Out: 2075 [Urine:2075] Intake/Output this shift:     Recent Labs  08/18/13 1150 08/19/13 0500 08/20/13 0445 08/21/13 0520  HGB 10.3* 10.4* 8.8* 9.4*    Recent Labs  08/20/13 0445 08/21/13 0520  WBC 14.5* 18.8*  RBC 2.76* 2.99*  HCT 25.2* 27.0*  PLT 152 179    Recent Labs  08/20/13 0445 08/21/13 0520  NA 143 140  K 3.9 4.2  CL 108 106  CO2 23 22  BUN 17 13  CREATININE 0.72 0.67  GLUCOSE 129* 127*  CALCIUM 6.8* 7.0*   Left upper extremity exam: Posterior splint intact.  Visible dressings clean and dry. Left lower extremity exam: Knee immobilizer intact to left leg.   Assessment/Plan: 1.  Status post ORIF open left humerus fracture. 2.  Status post ORIF closed left tibial plateau fracture. Plan: Continue posterior splint on left upper extremity with sling.          Continue knee immobilizer to left lower extremity.          Will follow.   Nolton Denis G 08/21/2013, 9:27 AM

## 2013-08-21 NOTE — Progress Notes (Signed)
Patient ID: Cory [name], male   DOB: 1980/11/23, 32 y.o.   MRN: [id number] Follow up - Trauma Critical Care  Patient Details:    Cory [name] is an 32 y.o. male.  Lines/tubes : PICC Triple Lumen 08/15/13 PICC Right Basilic 43 cm 3 cm (Active)  Indication for Insertion or Continuance of Line Prolonged intravenous therapies 08/20/2013  8:00 PM  Exposed Catheter (cm) 3 cm 08/15/2013  9:00 AM  Site Assessment Clean;Dry;Intact 08/20/2013  8:00 PM  Lumen #1 Status Infusing;Flushed;Blood return noted 08/20/2013  8:00 PM  Lumen #2 Status Infusing;Flushed;Blood return noted 08/20/2013  8:00 PM  Lumen #3 Status Infusing;Flushed;Blood return noted 08/20/2013  8:00 PM  Dressing Type Transparent 08/20/2013  8:00 PM  Dressing Status Clean;Dry;Intact 08/20/2013  8:00 PM  Line Care Connections checked and tightened 08/20/2013  8:00 PM  Dressing Change Due 08/22/13 08/17/2013  8:00 PM     Arterial Line 08/21/13 Right Radial (Active)  Site Assessment Clean;Dry;Intact 08/21/2013  5:00 AM  Line Status Pulsatile blood flow 08/21/2013  5:00 AM  Art Line Waveform Appropriate 08/21/2013  5:00 AM  Art Line Interventions Zeroed and calibrated;Connections checked and tightened;Flushed per protocol 08/21/2013  5:00 AM  Color/Movement/Sensation Capillary refill less than 3 sec 08/21/2013  5:00 AM  Dressing Type Transparent 08/21/2013  5:00 AM  Dressing Status Clean;Dry;Intact 08/21/2013  5:00 AM     Gastrostomy/Enterostomy Percutaneous endoscopic gastrostomy (PEG) (Active)  Surrounding Skin Dry;Non reddened 08/20/2013  8:00 PM  Tube Status Patent 08/20/2013  8:00 PM  Drainage Appearance Tan 08/20/2013  8:00 PM  Catheter Position (cm marking) 5 cm 08/20/2013  4:00 PM  Dressing Status Old drainage 08/20/2013  8:00 PM  Dressing Intervention Dressing changed 08/20/2013  8:00 PM  Dressing Type Abdominal Binder;Split gauze 08/20/2013  8:00 PM  Gastric Residual 400 mL 08/21/2013  4:00 AM     External Urinary  Catheter (Active)  Collection Container Standard drainage bag 08/20/2013  8:00 PM  Securement Method Leg strap 08/20/2013  8:00 PM    Microbiology/Sepsis markers: Results for orders placed during the hospital encounter of 08/11/13  MRSA PCR SCREENING     Status: None   Collection Time    08/12/13 12:24 AM      Result Value Range Status   MRSA by PCR NEGATIVE  NEGATIVE Final   Comment:            The GeneXpert MRSA Assay (FDA     approved for NASAL specimens     only), is one component of a     comprehensive MRSA colonization     surveillance program. It is not     intended to diagnose MRSA     infection nor to guide or     monitor treatment for     MRSA infections.  CULTURE, RESPIRATORY (NON-EXPECTORATED)     Status: None   Collection Time    08/14/13 11:15 AM      Result Value Range Status   Specimen Description OTHER   Final   Special Requests SPECIMEN OBTAIN BY ET SUCTION   Final   Gram Stain     Final   Value: ABUNDANT WBC PRESENT,BOTH PMN AND MONONUCLEAR     NO SQUAMOUS EPITHELIAL CELLS SEEN     ABUNDANT GRAM POSITIVE COCCI IN PAIRS     MODERATE GRAM NEGATIVE RODS     FEW GRAM POSITIVE RODS   Culture     Final   Value: FEW STREPTOCOCCUS,BETA HEMOLYIC NOT GROUP A  Performed at [facility]   Report Status 08/18/2013 FINAL   Final  URINE CULTURE     Status: None   Collection Time    08/18/13 11:48 AM      Result Value Range Status   Specimen Description URINE, CATHETERIZED   Final   Special Requests NONE   Final   Culture  Setup Time     Final   Value: 08/18/2013 12:50     Performed at [facility] Count     Final   Value: NO GROWTH     Performed at [facility]   Culture     Final   Value: NO GROWTH     Performed at [facility]   Report Status 08/19/2013 FINAL   Final  CULTURE, BLOOD (ROUTINE X 2)     Status: None   Collection Time    08/19/13 11:32 PM      Result Value Range Status   Specimen Description BLOOD  LEFT HAND   Final   Special Requests BOTTLES DRAWN AEROBIC AND ANAEROBIC 5CC EA   Final   Culture  Setup Time     Final   Value: 08/20/2013 14:04     Performed at [facility]   Culture     Final   Value:        BLOOD CULTURE RECEIVED NO GROWTH TO DATE CULTURE WILL BE HELD FOR 5 DAYS BEFORE ISSUING A FINAL NEGATIVE REPORT     Performed at [facility]   Report Status PENDING   Incomplete  CULTURE, BLOOD (ROUTINE X 2)     Status: None   Collection Time    08/19/13 11:42 PM      Result Value Range Status   Specimen Description BLOOD CENTRAL LINE   Final   Special Requests BOTTLES DRAWN AEROBIC AND ANAEROBIC 5CC EA   Final   Culture  Setup Time     Final   Value: 08/20/2013 14:04     Performed at [facility]   Culture     Final   Value:        BLOOD CULTURE RECEIVED NO GROWTH TO DATE CULTURE WILL BE HELD FOR 5 DAYS BEFORE ISSUING A FINAL NEGATIVE REPORT     Performed at [facility]   Report Status PENDING   Incomplete    Anti-infectives:  Anti-infectives   Start     Dose/Rate Route Frequency Ordered Stop   08/20/13 1000  vancomycin (VANCOCIN) IVPB 750 mg/150 ml premix     750 mg 150 mL/hr over 60 Minutes Intravenous Every 8 hours 08/20/13 0915     08/17/13 1100  piperacillin-tazobactam (ZOSYN) IVPB 3.375 g     3.375 g 12.5 mL/hr over 240 Minutes Intravenous Every 8 hours 08/17/13 0938     08/17/13 1000  vancomycin (VANCOCIN) IVPB 750 mg/150 ml premix  Status:  Discontinued     750 mg 150 mL/hr over 60 Minutes Intravenous Every 8 hours 08/17/13 0936 08/18/13 1014   08/13/13 2200  ceFAZolin (ANCEF) IVPB 2 g/50 mL premix     2 g 100 mL/hr over 30 Minutes Intravenous 3 times per day 08/13/13 1930 08/15/13 1522   08/12/13 0600  ceFAZolin (ANCEF) IVPB 2 g/50 mL premix  Status:  Discontinued     2 g 100 mL/hr over 30 Minutes Intravenous 3 times per day 08/12/13 0047 08/13/13 1930   08/11/13 2245  ceFAZolin (ANCEF) IVPB 2 g/50  mL premix     2 g 100  mL/hr over 30 Minutes Intravenous  Once 08/11/13 2239 08/11/13 2323      Best Practice/Protocols:  VTE Prophylaxis: Mechanical Intermittent Sedation  Consults: Treatment Team:  Darletta Moll, MD Hewitt Shorts, MD    Studies:    Events:  Subjective:    Overnight Issues:   Objective:  Vital signs for last 24 hours: Temp:  [98.4 F (36.9 C)-101.6 F (38.7 C)] 98.4 F (36.9 C) (10/27 0700) Pulse Rate:  [88-130] 95 (10/27 1004) Resp:  [17-36] 29 (10/27 1004) BP: (95-165)/(51-95) 95/69 mmHg (10/27 1004) SpO2:  [95 %-100 %] 99 % (10/27 1004) Arterial Line BP: (111-158)/(54-82) 133/65 mmHg (10/27 0900) FiO2 (%):  [28 %-35 %] 30 % (10/27 1004) Weight:  [117 lb 15.1 oz (53.5 kg)] 117 lb 15.1 oz (53.5 kg) (10/27 0300)  Hemodynamic parameters for last 24 hours:    Intake/Output from previous day: 10/26 0701 - 10/27 0700 In: 4004 [I.V.:2294; NG/GT:1110; IV Piggyback:600] Out: 2075 [Urine:2075]  Intake/Output this shift:    Vent settings for last 24 hours: Vent Mode:  [-] CPAP;PSV FiO2 (%):  [28 %-35 %] 30 % Set Rate:  [16 bmp] 16 bmp Vt Set:  [500 mL] 500 mL PEEP:  [5 cmH20] 5 cmH20 Pressure Support:  [10 cmH20] 10 cmH20 Plateau Pressure:  [21 cmH20-22 cmH20] 22 cmH20  Physical Exam:  General: on vent Neuro: PERL, will not open eyes, not F/C HEENT/Neck: trach-clean, intact and collar Resp: few rhonchi CVS: RRR GI: soft, NT, some bloody drainage from PEG site, no erythema Extremities: some edema L foot  Results for orders placed during the hospital encounter of 08/11/13 (from the past 24 hour(s))  GLUCOSE, CAPILLARY     Status: Abnormal   Collection Time    08/20/13 11:31 AM      Result Value Range   Glucose-Capillary 103 (*) 70 - 99 mg/dL  GLUCOSE, CAPILLARY     Status: Abnormal   Collection Time    08/20/13  3:32 PM      Result Value Range   Glucose-Capillary 139 (*) 70 - 99 mg/dL  GLUCOSE, CAPILLARY     Status: Abnormal   Collection Time     08/20/13  7:55 PM      Result Value Range   Glucose-Capillary 125 (*) 70 - 99 mg/dL  GLUCOSE, CAPILLARY     Status: Abnormal   Collection Time    08/20/13 11:53 PM      Result Value Range   Glucose-Capillary 124 (*) 70 - 99 mg/dL  GLUCOSE, CAPILLARY     Status: Abnormal   Collection Time    08/21/13  4:06 AM      Result Value Range   Glucose-Capillary 116 (*) 70 - 99 mg/dL  BASIC METABOLIC PANEL     Status: Abnormal   Collection Time    08/21/13  5:20 AM      Result Value Range   Sodium 140  135 - 145 mEq/L   Potassium 4.2  3.5 - 5.1 mEq/L   Chloride 106  96 - 112 mEq/L   CO2 22  19 - 32 mEq/L   Glucose, Bld 127 (*) 70 - 99 mg/dL   BUN 13  6 - 23 mg/dL   Creatinine, Ser 1.61  0.50 - 1.35 mg/dL   Calcium 7.0 (*) 8.4 - 10.5 mg/dL   GFR calc non Af Amer >90  >90 mL/min   GFR calc Af Amer >90  >  90 mL/min  CBC     Status: Abnormal   Collection Time    08/21/13  5:20 AM      Result Value Range   WBC 18.8 (*) 4.0 - 10.5 K/uL   RBC 2.99 (*) 4.22 - 5.81 MIL/uL   Hemoglobin 9.4 (*) 13.0 - 17.0 g/dL   HCT 47.8 (*) 29.5 - 62.1 %   MCV 90.3  78.0 - 100.0 fL   MCH 31.4  26.0 - 34.0 pg   MCHC 34.8  30.0 - 36.0 g/dL   RDW 30.8  65.7 - 84.6 %   Platelets 179  150 - 400 K/uL  GLUCOSE, CAPILLARY     Status: Abnormal   Collection Time    08/21/13  7:37 AM      Result Value Range   Glucose-Capillary 159 (*) 70 - 99 mg/dL    Assessment & Plan: Present on Admission:  . SDH (subdural hematoma) . SAH (subarachnoid hemorrhage) . Respiratory failure, acute . traumatic left humerus fracture . Dissection of vertebral artery . Acute blood loss anemia   LOS: 10 days   Additional comments:I reviewed the patient's new clinical lab test results. Marland Kitchen and I reviewed the patients new imaging test results. Marland Kitchen PHBC  Severe TBI/epidural hematoma, SDH/SAH, intraparenchymal contusion-Dr. Newell Coral following, Na good VDRF - wean to [facility] Tibial plateau fracture, left humerus fracture-ORIF Dr. Luiz Blare  10/19  Facial Fractures; frontal sinus, bilateral orbital wall, right maxillary wall, nasal septum-Dr. Suszanne Conners following, will not likely need surgical intervention.  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  I have reviewed this patient's available data, including medical history, events of note, physical examination and test results as part of my evaluation.

## 2013-08-22 ENCOUNTER — Inpatient Hospital Stay (HOSPITAL_COMMUNITY): Payer: Medicaid Other

## 2013-08-22 LAB — GLUCOSE, CAPILLARY
Glucose-Capillary: 120 mg/dL — ABNORMAL HIGH (ref 70–99)
Glucose-Capillary: 116 mg/dL — ABNORMAL HIGH (ref 70–99)

## 2013-08-22 LAB — CBC
Hemoglobin: 8.4 g/dL — ABNORMAL LOW (ref 13.0–17.0)
MCH: 31.9 pg (ref 26.0–34.0)
MCHC: 35.6 g/dL (ref 30.0–36.0)
MCV: 89.7 fL (ref 78.0–100.0)
Platelets: 232 10*3/uL (ref 150–400)
RDW: 15.4 % (ref 11.5–15.5)

## 2013-08-22 LAB — BASIC METABOLIC PANEL
Calcium: 7 mg/dL — ABNORMAL LOW (ref 8.4–10.5)
GFR calc Af Amer: >90 mL/min (ref >90)
GFR calc non Af Amer: >90 mL/min (ref >90)
Potassium: 3.7 mEq/L (ref 3.5–5.1)
Sodium: 137 mEq/L (ref 135–145)

## 2013-08-22 LAB — VANCOMYCIN, TROUGH: Vancomycin Tr: 13 ug/mL (ref 10.0–20.0)

## 2013-08-22 MED ORDER — ERYTHROMYCIN ETHYLSUCCINATE 400 MG/5ML PO SUSR
400.0000 mg | Freq: Three times a day (TID) | ORAL | Status: DC
  Administered 2013-08-22 – 2013-08-29 (×21): 400 mg
  Filled 2013-08-22 (×24): qty 5

## 2013-08-22 MED ORDER — FUROSEMIDE 8 MG/ML PO SOLN
20.0000 mg | Freq: Once | ORAL | Status: AC
  Administered 2013-08-22: 20 mg via ORAL
  Filled 2013-08-22: qty 5

## 2013-08-22 MED ORDER — CALCIUM GLUCONATE 10 % IV SOLN
1.0000 g | Freq: Once | INTRAVENOUS | Status: AC
  Administered 2013-08-22: 1 g via INTRAVENOUS
  Filled 2013-08-22: qty 10

## 2013-08-22 NOTE — Progress Notes (Signed)
Subjective: Patient continues on ventilator via trach. Sedated with fentanyl, no longer on Versed.  Objective: Vital signs in last 24 hours: Filed Vitals:   08/22/13 0400 08/22/13 0500 08/22/13 0600 08/22/13 0700  BP: 104/57 104/54 99/50 99/57   Pulse: 94 96 90 79  Temp: 100.7 F (38.2 C)     TempSrc: Oral     Resp: 18 16 17 16   Height:      Weight:      SpO2: 100% 99% 100% 100%    Intake/Output from previous day: 10/27 0701 - 10/28 0700 In: 3077.6 [I.V.:2317.6; NG/GT:60; IV Piggyback:700] Out: 1800 [Urine:950; Drains:850] Intake/Output this shift:    Physical Exam:  Not opening eyes to voice or pain. Pupils 2 millimeters bilaterally, round, sluggishly reactive to light. Corneals present bilaterally.  CBC  Recent Labs  08/21/13 2043 08/22/13 0605  WBC 22.1* 16.9*  HGB 9.7* 8.4*  HCT 27.9* 23.6*  PLT 259 232   BMET  Recent Labs  08/21/13 0520 08/22/13 0605  NA 140 137  K 4.2 3.7  CL 106 102  CO2 22 24  GLUCOSE 127* 143*  BUN 13 11  CREATININE 0.67 0.65  CALCIUM 7.0* 7.0*    Studies/Results: Dg Chest Port 1 View  08/21/2013   CLINICAL DATA:  Blood in tracheostomy  EXAM: PORTABLE CHEST - 1 VIEW  COMPARISON:  The 08/20/2013  FINDINGS: Tracheostomy and right upper extremity PICC in stable position.  Normal heart size and mediastinal contours.  Patchy bilateral lung opacities, most dense at the bases. These are relatively stable from prior. Relative lucency at the peripheral left base is likely the overlapping hardware, rather than pneumothorax.  IMPRESSION: 1. Unchanged patchy lower lung opacities which could represent atelectasis or aspiration pneumonitis/pneumonia). 2. Tracheostomy in unremarkable position.   Electronically Signed   By: Tiburcio Pea M.D.   On: 08/21/2013 21:24    Assessment/Plan: Neurologically without change. Continuing current supportive care.   Hewitt Shorts, MD 08/22/2013, 7:39 AM

## 2013-08-22 NOTE — Progress Notes (Addendum)
Patient ID: Cory [name], male   DOB: [birth year], 32 y.o.   MRN: [id number] Follow up - Trauma Critical Care  Patient Details:    Cory [name] is an 32 y.o. male.  Lines/tubes : PICC Triple Lumen 08/15/13 PICC Right Basilic 43 cm 3 cm (Active)  Indication for Insertion or Continuance of Line Prolonged intravenous therapies 08/21/2013  8:00 PM  Exposed Catheter (cm) 3 cm 08/15/2013  9:00 AM  Site Assessment Clean;Dry;Intact 08/21/2013  8:00 PM  Lumen #1 Status Infusing;Flushed;Blood return noted 08/21/2013  8:00 PM  Lumen #2 Status Infusing;Flushed;Blood return noted 08/21/2013  8:00 PM  Lumen #3 Status Infusing;Flushed;Blood return noted 08/21/2013  8:00 PM  Dressing Type Transparent 08/21/2013  8:00 PM  Dressing Status Clean;Dry;Intact 08/21/2013  8:00 PM  Line Care Connections checked and tightened 08/20/2013  8:00 PM  Dressing Change Due 08/22/13 08/17/2013  8:00 PM     Arterial Line 08/21/13 Right Radial (Active)  Site Assessment Clean;Dry;Intact 08/21/2013  8:00 PM  Line Status Pulsatile blood flow 08/21/2013  8:00 PM  Art Line Waveform Appropriate 08/21/2013  8:00 PM  Art Line Interventions Zeroed and calibrated;Connections checked and tightened;Flushed per protocol 08/21/2013  8:00 PM  Color/Movement/Sensation Capillary refill less than 3 sec 08/21/2013  8:00 PM  Dressing Type Transparent 08/21/2013  8:00 PM  Dressing Status Clean;Dry;Intact 08/21/2013  8:00 PM     Gastrostomy/Enterostomy Percutaneous endoscopic gastrostomy (PEG) (Active)  Surrounding Skin Dry;Non reddened 08/21/2013  8:00 PM  Tube Status Clamped 08/21/2013  8:00 PM  Drainage Appearance Tan 08/21/2013  8:00 PM  Catheter Position (cm marking) 5 cm 08/21/2013  8:00 AM  Dressing Status Clean;Dry;Intact 08/21/2013  8:00 PM  Dressing Intervention New dressing 08/21/2013  8:00 AM  Dressing Type Abdominal Binder;Split gauze 08/21/2013  8:00 PM  Dressing Change Due 08/22/13 08/21/2013  8:00 AM  Gastric Residual  500 mL 08/21/2013  8:00 PM  Output (mL) 100 mL 08/22/2013  4:00 AM     External Urinary Catheter (Active)  Collection Container Standard drainage bag 08/21/2013  8:00 PM  Securement Method Tape 08/21/2013  8:00 PM    Microbiology/Sepsis markers: Results for orders placed during the hospital encounter of 08/11/13  MRSA PCR SCREENING     Status: None   Collection Time    08/12/13 12:24 AM      Result Value Range Status   MRSA by PCR NEGATIVE  NEGATIVE Final   Comment:            The GeneXpert MRSA Assay (FDA     approved for NASAL specimens     only), is one component of a     comprehensive MRSA colonization     surveillance program. It is not     intended to diagnose MRSA     infection nor to guide or     monitor treatment for     MRSA infections.  CULTURE, RESPIRATORY (NON-EXPECTORATED)     Status: None   Collection Time    08/14/13 11:15 AM      Result Value Range Status   Specimen Description OTHER   Final   Special Requests SPECIMEN OBTAIN BY ET SUCTION   Final   Gram Stain     Final   Value: ABUNDANT WBC PRESENT,BOTH PMN AND MONONUCLEAR     NO SQUAMOUS EPITHELIAL CELLS SEEN     ABUNDANT GRAM POSITIVE COCCI IN PAIRS     MODERATE GRAM NEGATIVE RODS     FEW GRAM POSITIVE RODS   Culture  Final   Value: FEW STREPTOCOCCUS,BETA HEMOLYIC NOT GROUP A     Performed at [facility]   Report Status 08/18/2013 FINAL   Final  URINE CULTURE     Status: None   Collection Time    08/18/13 11:48 AM      Result Value Range Status   Specimen Description URINE, CATHETERIZED   Final   Special Requests NONE   Final   Culture  Setup Time     Final   Value: 08/18/2013 12:50     Performed at [facility] Count     Final   Value: NO GROWTH     Performed at [facility]   Culture     Final   Value: NO GROWTH     Performed at [facility]   Report Status 08/19/2013 FINAL   Final  CULTURE, BLOOD (ROUTINE X 2)     Status: None   Collection  Time    08/19/13 11:32 PM      Result Value Range Status   Specimen Description BLOOD LEFT HAND   Final   Special Requests BOTTLES DRAWN AEROBIC AND ANAEROBIC 5CC EA   Final   Culture  Setup Time     Final   Value: 08/20/2013 14:04     Performed at [facility]   Culture     Final   Value:        BLOOD CULTURE RECEIVED NO GROWTH TO DATE CULTURE WILL BE HELD FOR 5 DAYS BEFORE ISSUING A FINAL NEGATIVE REPORT     Performed at [facility]   Report Status PENDING   Incomplete  CULTURE, BLOOD (ROUTINE X 2)     Status: None   Collection Time    08/19/13 11:42 PM      Result Value Range Status   Specimen Description BLOOD CENTRAL LINE   Final   Special Requests BOTTLES DRAWN AEROBIC AND ANAEROBIC 5CC EA   Final   Culture  Setup Time     Final   Value: 08/20/2013 14:04     Performed at [facility]   Culture     Final   Value:        BLOOD CULTURE RECEIVED NO GROWTH TO DATE CULTURE WILL BE HELD FOR 5 DAYS BEFORE ISSUING A FINAL NEGATIVE REPORT     Performed at [facility]   Report Status PENDING   Incomplete    Anti-infectives:  Anti-infectives   Start     Dose/Rate Route Frequency Ordered Stop   08/22/13 1000  erythromycin (EES) 400 MG/5ML suspension 400 mg     400 mg Per Tube 3 times per day 08/22/13 0757     08/22/13 0000  vancomycin (VANCOCIN) IVPB 1000 mg/200 mL premix     1,000 mg 200 mL/hr over 60 Minutes Intravenous 4 times per day 08/21/13 1834     08/20/13 1000  vancomycin (VANCOCIN) IVPB 750 mg/150 ml premix  Status:  Discontinued     750 mg 150 mL/hr over 60 Minutes Intravenous Every 8 hours 08/20/13 0915 08/21/13 1834   08/17/13 1100  piperacillin-tazobactam (ZOSYN) IVPB 3.375 g     3.375 g 12.5 mL/hr over 240 Minutes Intravenous Every 8 hours 08/17/13 0938     08/17/13 1000  vancomycin (VANCOCIN) IVPB 750 mg/150 ml premix  Status:  Discontinued     750 mg 150 mL/hr over 60 Minutes Intravenous Every 8 hours 08/17/13 0936 08/18/13 1014  08/13/13 2200  ceFAZolin (ANCEF) IVPB 2 g/50 mL premix     2 g 100 mL/hr over 30 Minutes Intravenous 3 times per day 08/13/13 1930 08/15/13 1522   08/12/13 0600  ceFAZolin (ANCEF) IVPB 2 g/50 mL premix  Status:  Discontinued     2 g 100 mL/hr over 30 Minutes Intravenous 3 times per day 08/12/13 0047 08/13/13 1930   08/11/13 2245  ceFAZolin (ANCEF) IVPB 2 g/50 mL premix     2 g 100 mL/hr over 30 Minutes Intravenous  Once 08/11/13 2239 08/11/13 2323      Best Practice/Protocols:  VTE Prophylaxis: Mechanical Intermittent Sedation  Consults: Treatment Team:  Darletta Moll, MD Hewitt Shorts, MD    Studies:CXR - Bibasilar opacities represent atelectasis, contusion, or  infiltrates. No pneumothorax.  Subjective:    Overnight Issues:  Blood from trach overnight has stopped spontaneously Objective:  Vital signs for last 24 hours: Temp:  [98.2 F (36.8 C)-101.4 F (38.6 C)] 98.2 F (36.8 C) (10/28 0700) Pulse Rate:  [63-137] 79 (10/28 0700) Resp:  [16-33] 16 (10/28 0700) BP: (95-146)/(50-99) 99/57 mmHg (10/28 0700) SpO2:  [96 %-100 %] 100 % (10/28 0700) Arterial Line BP: (96-189)/(45-115) 103/48 mmHg (10/28 0700) FiO2 (%):  [30 %-50 %] 40 % (10/28 0700) Weight:  [118 lb 13.3 oz (53.9 kg)] 118 lb 13.3 oz (53.9 kg) (10/28 0200)  Hemodynamic parameters for last 24 hours:    Intake/Output from previous day: 10/27 0701 - 10/28 0700 In: 3077.6 [I.V.:2317.6; NG/GT:60; IV Piggyback:700] Out: 2000 [Urine:1150; Drains:850]  Intake/Output this shift:    Vent settings for last 24 hours: Vent Mode:  [-] PRVC FiO2 (%):  [30 %-50 %] 40 % Set Rate:  [16 bmp] 16 bmp Vt Set:  [500 mL] 500 mL PEEP:  [5 cmH20] 5 cmH20 Pressure Support:  [10 cmH20] 10 cmH20 Plateau Pressure:  [21 cmH20-22 cmH20] 22 cmH20  Physical Exam:  General: on vent Neuro: PERL, opened L eye to voice but no F/C HEENT/Neck: ETT and collar, mild bloody secretions Resp: clear to auscultation bilaterally CVS:  RRR GI: soft, NT, mild serous D/C from PEG site Extremities: some edema L foot  Results for orders placed during the hospital encounter of 08/11/13 (from the past 24 hour(s))  GLUCOSE, CAPILLARY     Status: None   Collection Time    08/21/13 11:57 AM      Result Value Range   Glucose-Capillary 94  70 - 99 mg/dL  GLUCOSE, CAPILLARY     Status: Abnormal   Collection Time    08/21/13  3:51 PM      Result Value Range   Glucose-Capillary 117 (*) 70 - 99 mg/dL   Comment 1 Notify RN     Comment 2 Documented in Chart    VANCOMYCIN, TROUGH     Status: Abnormal   Collection Time    08/21/13  5:40 PM      Result Value Range   Vancomycin Tr 5.5 (*) 10.0 - 20.0 ug/mL  GLUCOSE, CAPILLARY     Status: Abnormal   Collection Time    08/21/13  7:57 PM      Result Value Range   Glucose-Capillary 124 (*) 70 - 99 mg/dL  CBC     Status: Abnormal   Collection Time    08/21/13  8:43 PM      Result Value Range   WBC 22.1 (*) 4.0 - 10.5 K/uL   RBC 3.06 (*) 4.22 - 5.81 MIL/uL  Hemoglobin 9.7 (*) 13.0 - 17.0 g/dL   HCT 04.5 (*) 40.9 - 81.1 %   MCV 91.2  78.0 - 100.0 fL   MCH 31.7  26.0 - 34.0 pg   MCHC 34.8  30.0 - 36.0 g/dL   RDW 91.4  78.2 - 95.6 %   Platelets 259  150 - 400 K/uL  PROTIME-INR     Status: None   Collection Time    08/21/13  8:43 PM      Result Value Range   Prothrombin Time 14.5  11.6 - 15.2 seconds   INR 1.15  0.00 - 1.49  APTT     Status: None   Collection Time    08/21/13  8:43 PM      Result Value Range   aPTT 29  24 - 37 seconds  GLUCOSE, CAPILLARY     Status: Abnormal   Collection Time    08/21/13 11:07 PM      Result Value Range   Glucose-Capillary 110 (*) 70 - 99 mg/dL  GLUCOSE, CAPILLARY     Status: Abnormal   Collection Time    08/22/13  3:38 AM      Result Value Range   Glucose-Capillary 106 (*) 70 - 99 mg/dL  CBC     Status: Abnormal   Collection Time    08/22/13  6:05 AM      Result Value Range   WBC 16.9 (*) 4.0 - 10.5 K/uL   RBC 2.63 (*) 4.22 -  5.81 MIL/uL   Hemoglobin 8.4 (*) 13.0 - 17.0 g/dL   HCT 21.3 (*) 08.6 - 57.8 %   MCV 89.7  78.0 - 100.0 fL   MCH 31.9  26.0 - 34.0 pg   MCHC 35.6  30.0 - 36.0 g/dL   RDW 46.9  62.9 - 52.8 %   Platelets 232  150 - 400 K/uL  BASIC METABOLIC PANEL     Status: Abnormal   Collection Time    08/22/13  6:05 AM      Result Value Range   Sodium 137  135 - 145 mEq/L   Potassium 3.7  3.5 - 5.1 mEq/L   Chloride 102  96 - 112 mEq/L   CO2 24  19 - 32 mEq/L   Glucose, Bld 143 (*) 70 - 99 mg/dL   BUN 11  6 - 23 mg/dL   Creatinine, Ser 4.13  0.50 - 1.35 mg/dL   Calcium 7.0 (*) 8.4 - 10.5 mg/dL   GFR calc non Af Amer >90  >90 mL/min   GFR calc Af Amer >90  >90 mL/min    Assessment & Plan: Present on Admission:  . SDH (subdural hematoma) . SAH (subarachnoid hemorrhage) . Respiratory failure, acute . traumatic left humerus fracture . Dissection of vertebral artery . Acute blood loss anemia   LOS: 11 days   Additional comments:I reviewed the patient's new clinical lab test results. and CXR PHBC  Severe TBI/epidural hematoma, SDH/SAH, intraparenchymal contusion-Dr. Newell Coral following, Na good VDRF - wean to [facility] today, weaned but no HTC yesterday Tibial plateau fracture, left humerus fracture-ORIF Dr. Luiz Blare 10/19  Facial Fractures; frontal sinus, bilateral orbital wall, right maxillary wall, nasal septum-Dr. Suszanne Conners following, will not likely need surgical intervention.  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 Lasix per tube X1 Dispo -- ICU Critical Care Total Time*: 35 Minutes I spoke to his mother Violeta Gelinas, MD, MPH,  FACS Pager: [phone]  08/22/2013  *Care during the described time interval was provided by me and/or other providers on the critical care team.  I have reviewed this patient's available data, including medical history, events of note, physical examination and test results as part of my evaluation.
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
## 2013-08-22 NOTE — Progress Notes (Signed)
UR completed.  Summit Arroyave, RN BSN MHA CCM Trauma/Neuro ICU Case Manager [phone]  

## 2013-08-22 NOTE — Progress Notes (Signed)
MD Magnus Ivan) notified of pt's stat [facility] and lab results.  No new orders given.  Will continue to monitor.

## 2013-08-22 NOTE — Progress Notes (Addendum)
During tracheal suctioning, pt suddenly began coughing up large amounts of bright red blood.  Pt's HR increased to 120-130, SBP >200, and 02 sats decreased to 90%.  MD Magnus Ivan) and RT notified.  Orders given for stat PCXR, CBC, PT, INR, and PTT.  RT arrived quickly and changed out pt's ventilator tubing as parts of the circuit were filled with bloody secretions.  Pt's versed gtt restarted.  Within a few minutes, pt's HR, BP, and 02 sats did return to baseline.  MD Magnus Ivan) also notified that pt continues to have high residuals (500 cc) despite pt's tube feedings being held since morning.  Order given to connect PEG tube to intermittent low wall suction.  Will continue to monitor.

## 2013-08-22 NOTE — Progress Notes (Signed)
RT placed patient back on ventilator support per protocol. Patient has not been on trach collar for 24hrs. Patient is tolerating vent well at this time. RT will continue to monitor.
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
## 2013-08-23 ENCOUNTER — Encounter (HOSPITAL_COMMUNITY): Payer: Self-pay | Admitting: Radiology

## 2013-08-23 LAB — BASIC METABOLIC PANEL
GFR calc Af Amer: >90 mL/min (ref >90)
GFR calc non Af Amer: >90 mL/min (ref >90)
Glucose, Bld: 109 mg/dL — ABNORMAL HIGH (ref 70–99)
Potassium: 4.1 mEq/L (ref 3.5–5.1)
Sodium: 137 mEq/L (ref 135–145)

## 2013-08-23 LAB — CBC
Hemoglobin: 9.5 g/dL — ABNORMAL LOW (ref 13.0–17.0)
MCHC: 35.1 g/dL (ref 30.0–36.0)
Platelets: 285 10*3/uL (ref 150–400)
RBC: 3.02 MIL/uL — ABNORMAL LOW (ref 4.22–5.81)
RDW: 15.5 % (ref 11.5–15.5)

## 2013-08-23 LAB — GLUCOSE, CAPILLARY
Glucose-Capillary: 124 mg/dL — ABNORMAL HIGH (ref 70–99)
Glucose-Capillary: 108 mg/dL — ABNORMAL HIGH (ref 70–99)
Glucose-Capillary: 108 mg/dL — ABNORMAL HIGH (ref 70–99)
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
## 2013-08-23 NOTE — H&P (Signed)
Agree.  Our coaxial J tubes may not fit through the g-tube depending on type of retention.  Will try to convert today.

## 2013-08-23 NOTE — Progress Notes (Signed)
Orthopedic Tech Progress Note Patient Details:  Cory [name] 1981-09-01 [id number]  Ortho Devices Type of Ortho Device: Arm sling;Abdominal binder Ortho Device/Splint Location: lle Ortho Device/Splint Interventions: Ordered   Jennye Moccasin 08/23/2013, 11:00 PM

## 2013-08-23 NOTE — Progress Notes (Signed)
Pt seen to evaluate trach. Pt is not awake or alert, In vegetative state per MD note. No family at bedside. Unsure what discharge plan is at this time. Trach team will continue to follow

## 2013-08-23 NOTE — Progress Notes (Signed)
Subjective: Patient continues on ventilator via ETT. Fentanyl sedation continues to be tapered. No longer on Versed sedation. Tube feedings currently on hold because of high residuals. RN reports that trauma surgery is having interventional radiology advance G-tube into the jejunum.  Objective: Vital signs in last 24 hours: Filed Vitals:   08/23/13 0500 08/23/13 0600 08/23/13 0700 08/23/13 0800  BP: 119/66 124/63 118/67   Pulse: 76 78 96   Temp:    98.5 F (36.9 C)  TempSrc:    Oral  Resp: 16 20 17    Height:      Weight: 50.2 kg (110 lb 10.7 oz)     SpO2: 100% 100% 100%     Intake/Output from previous day: 10/28 0701 - 10/29 0700 In: 2055 [I.V.:995; IV Piggyback:1060] Out: 2650 [Urine:2650] Intake/Output this shift: Total I/O In: 82.5 [I.V.:82.5] Out: -   Physical Exam:  Opening left eye slightly spontaneously. Not opening eyes to voice or pain. No attempts at speech. Not following commands. Some occasional side to side movement of head. Pupils 3 mm, round, sluggishly reactive to light. No movement to deep central pain.  CBC  Recent Labs  08/22/13 0605 08/23/13 0600  WBC 16.9* 18.5*  HGB 8.4* 9.5*  HCT 23.6* 27.1*  PLT 232 285   BMET  Recent Labs  08/22/13 0605 08/23/13 0600  NA 137 137  K 3.7 4.1  CL 102 100  CO2 24 26  GLUCOSE 143* 109*  BUN 11 13  CREATININE 0.65 0.66  CALCIUM 7.0* 8.0*   Studies/Results: Dg Chest Port 1 View  08/22/2013   CLINICAL DATA:  Respiratory failure  EXAM: PORTABLE CHEST - 1 VIEW  COMPARISON:  08/21/2013  FINDINGS: Tracheostomy tube unchanged. Normal cardiac silhouette. Mild bibasilar opacities are unchanged. No pneumothorax. .  IMPRESSION: Bibasilar opacities represent atelectasis, contusion, or infiltrates. No pneumothorax.   Electronically Signed   By: Genevive Bi M.D.   On: 08/22/2013 07:43   Dg Chest Port 1 View  08/21/2013   CLINICAL DATA:  Blood in tracheostomy  EXAM: PORTABLE CHEST - 1 VIEW  COMPARISON:  The  08/20/2013  FINDINGS: Tracheostomy and right upper extremity PICC in stable position.  Normal heart size and mediastinal contours.  Patchy bilateral lung opacities, most dense at the bases. These are relatively stable from prior. Relative lucency at the peripheral left base is likely the overlapping hardware, rather than pneumothorax.  IMPRESSION: 1. Unchanged patchy lower lung opacities which could represent atelectasis or aspiration pneumonitis/pneumonia). 2. Tracheostomy in unremarkable position.   Electronically Signed   By: Tiburcio Pea M.D.   On: 08/21/2013 21:24    Assessment/Plan: Patient remains in coma. No evidence of conscious response. Slightly greater spontaneous response. Spoke with the patient's mother and her friend at the patient's bedside. Discussed last weeks CT the brain, and current neurologic exam, and progression of neurologic exam over the past several days. I explained that the patient remains in a coma, without conscious response. I further explained that it is uncertain whether he will regain consciousness, or whether he will progress to a chronic vegetative state. Her questions regarding his condition and our plans for treatment and care were discussed. I also discussed the case with Dr. Violeta Gelinas from trauma surgery.   Hewitt Shorts, MD 08/23/2013, 8:46 AM

## 2013-08-24 ENCOUNTER — Inpatient Hospital Stay (HOSPITAL_COMMUNITY): Payer: Medicaid Other

## 2013-08-24 LAB — BASIC METABOLIC PANEL
Calcium: 8.2 mg/dL — ABNORMAL LOW (ref 8.4–10.5)
GFR calc Af Amer: >90 mL/min (ref >90)
GFR calc non Af Amer: >90 mL/min (ref >90)
Glucose, Bld: 108 mg/dL — ABNORMAL HIGH (ref 70–99)
Sodium: 138 mEq/L (ref 135–145)

## 2013-08-24 LAB — GLUCOSE, CAPILLARY
Glucose-Capillary: 105 mg/dL — ABNORMAL HIGH (ref 70–99)
Glucose-Capillary: 131 mg/dL — ABNORMAL HIGH (ref 70–99)
Glucose-Capillary: 138 mg/dL — ABNORMAL HIGH (ref 70–99)

## 2013-08-24 LAB — CULTURE, RESPIRATORY: Special Requests: NORMAL

## 2013-08-24 LAB — CULTURE, RESPIRATORY W GRAM STAIN

## 2013-08-24 LAB — CBC
HCT: 28.2 % — ABNORMAL LOW (ref 39.0–52.0)
MCH: 31.2 pg (ref 26.0–34.0)
MCHC: 34.4 g/dL (ref 30.0–36.0)
Platelets: 357 10*3/uL (ref 150–400)
RBC: 3.11 MIL/uL — ABNORMAL LOW (ref 4.22–5.81)
RDW: 15.1 % (ref 11.5–15.5)

## 2013-08-24 MED ORDER — IOHEXOL 300 MG/ML  SOLN
50.0000 mL | Freq: Once | INTRAMUSCULAR | Status: AC | PRN
  Administered 2013-08-24: 1 mL via INTRAVENOUS

## 2013-08-24 MED ORDER — CIPROFLOXACIN IN D5W 400 MG/200ML IV SOLN
400.0000 mg | Freq: Two times a day (BID) | INTRAVENOUS | Status: AC
  Administered 2013-08-24 – 2013-08-29 (×10): 400 mg via INTRAVENOUS
  Filled 2013-08-24 (×10): qty 200

## 2013-08-24 NOTE — Progress Notes (Signed)
Subjective: 7 Days Post-Op Procedure(s) (LRB): PERCUTANEOUS TRACHEOSTOMY (N/A) 11 days status post ORIF left 11 days status post ORIF left open humerus fracture No changes.  Still on vent with tracheostomy  Objective: Vital signs in last 24 hours: Temp:  [98.3 F (36.8 C)-100.2 F (37.9 C)] 99.7 F (37.6 C) (10/30 0723) Pulse Rate:  [68-128] 100 (10/30 0904) Resp:  [15-31] 26 (10/30 0904) BP: (107-143)/(55-91) 125/68 mmHg (10/30 0904) SpO2:  [97 %-100 %] 100 % (10/30 0904) FiO2 (%):  [28 %-40 %] 40 % (10/30 0847)  Intake/Output from previous day: 10/29 0701 - 10/30 0700 In: 3500.7 [I.V.:1890; NG/GT:710.7; IV Piggyback:150] Out: 2105 [Urine:2105] Intake/Output this shift: Total I/O In: 75 [I.V.:75] Out: 225 [Urine:225]   Recent Labs  08/21/13 2043 08/22/13 0605 08/23/13 0600 08/24/13 0500  HGB 9.7* 8.4* 9.5* 9.7*    Recent Labs  08/23/13 0600 08/24/13 0500  WBC 18.5* 19.0*  RBC 3.02* 3.11*  HCT 27.1* 28.2*  PLT 285 357    Recent Labs  08/23/13 0600 08/24/13 0500  NA 137 138  K 4.1 4.0  CL 100 102  CO2 26 24  BUN 13 11  CREATININE 0.66 0.69  GLUCOSE 109* 108*  CALCIUM 8.0* 8.2*    Recent Labs  08/21/13 2043  INR 1.15   long-arm splint intact to left upper extremity. Left leg dressing clean and dry.  Knee immobilizer intact.  Assessment/Plan: 7 Days Post-Op Procedure(s) (LRB): PERCUTANEOUS TRACHEOSTOMY (N/A) 11 days status post ORIF left proximal tibial plateau fracture. 11 days status post ORIF left open humerus fracture. Plan: Continue in sling for left upper extremity. Continue in knee immobilizer on the left. We will change dressings in a few days.   Otis Burress G 08/24/2013, 9:12 AM

## 2013-08-24 NOTE — Progress Notes (Signed)
Subjective: Patient continues on ventilator via ETT. Continues on low dose of fentanyl for sedation, which has been tapered further.  Objective: Vital signs in last 24 hours: Filed Vitals:   08/24/13 0337 08/24/13 0400 08/24/13 0500 08/24/13 0600  BP:  109/62 122/72 123/80  Pulse:  80 123 116  Temp: 98.3 F (36.8 C)     TempSrc: Axillary     Resp:  22 18 22   Height:      Weight:      SpO2:  97% 100% 100%    Intake/Output from previous day: 10/29 0701 - 10/30 0700 In: 2712.5 [I.V.:1812.5; IV Piggyback:150] Out: 2105 [Urine:2105] Intake/Output this shift:    Physical Exam:  Opens left eye slightly to voice. Not following commands. Not attempts at speech. Corneals present bilaterally. Pupils 3 mm, round, sluggishly reactive to light. No movement to deep central pain.  CBC  Recent Labs  08/22/13 0605 08/23/13 0600  WBC 16.9* 18.5*  HGB 8.4* 9.5*  HCT 23.6* 27.1*  PLT 232 285   BMET  Recent Labs  08/22/13 0605 08/23/13 0600  NA 137 137  K 3.7 4.1  CL 102 100  CO2 24 26  GLUCOSE 143* 109*  BUN 11 13  CREATININE 0.65 0.66  CALCIUM 7.0* 8.0*    Assessment/Plan: Beginning to open left eye, no evidence of conscious response.   Hewitt Shorts, MD 08/24/2013, 7:01 AM
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
## 2013-08-24 NOTE — Procedures (Signed)
Successful GJ conversion tube insertion but only able to advance tube into duodenum No comp Stable Ready for use
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
## 2013-08-24 NOTE — Clinical Social Work Note (Signed)
Clinical Social Worker continuing to follow patient and family for support and discharge planning needs.  CSW attempted to meet with patient mother at bedside, however she is gone for the day per staff.  CSW attempted to reach her on the phone, but had to leave a message.  Awaiting return call to address SNF placement now that patient is maintaining trach collar at 28%.  CSW to follow up with patient mother in the morning to further address and attempt to initiate search.  CSW remains available for support and to facilitate patient discharge needs once medically ready and bed available.  [name], [place] [phone]

## 2013-08-24 NOTE — Progress Notes (Signed)
Wasted 250cc of fentanyl infusion with Marlana Latus RN

## 2013-08-24 NOTE — Progress Notes (Signed)
Pt transported to IR for procedure, handoff given to RN.

## 2013-08-25 ENCOUNTER — Inpatient Hospital Stay (HOSPITAL_COMMUNITY): Payer: Medicaid Other

## 2013-08-25 LAB — BASIC METABOLIC PANEL
CO2: 24 mEq/L (ref 19–32)
GFR calc Af Amer: >90 mL/min (ref >90)
GFR calc non Af Amer: >90 mL/min (ref >90)
Glucose, Bld: 157 mg/dL — ABNORMAL HIGH (ref 70–99)
Potassium: 4 mEq/L (ref 3.5–5.1)
Sodium: 140 mEq/L (ref 135–145)

## 2013-08-25 LAB — CBC
Hemoglobin: 9.7 g/dL — ABNORMAL LOW (ref 13.0–17.0)
MCHC: 34.4 g/dL (ref 30.0–36.0)
RBC: 3.12 MIL/uL — ABNORMAL LOW (ref 4.22–5.81)
RDW: 15.3 % (ref 11.5–15.5)

## 2013-08-25 LAB — GLUCOSE, CAPILLARY
Glucose-Capillary: 121 mg/dL — ABNORMAL HIGH (ref 70–99)
Glucose-Capillary: 125 mg/dL — ABNORMAL HIGH (ref 70–99)
Glucose-Capillary: 135 mg/dL — ABNORMAL HIGH (ref 70–99)
Glucose-Capillary: 139 mg/dL — ABNORMAL HIGH (ref 70–99)

## 2013-08-25 LAB — CLOSTRIDIUM DIFFICILE BY PCR: Toxigenic C. Difficile by PCR: NEGATIVE

## 2013-08-25 MED ORDER — PIVOT 1.5 CAL PO LIQD
1000.0000 mL | ORAL | Status: DC
  Administered 2013-08-26 – 2013-08-29 (×5): 1000 mL
  Filled 2013-08-25 (×7): qty 1000

## 2013-08-25 MED ORDER — PROPRANOLOL HCL 20 MG/5ML PO SOLN
40.0000 mg | Freq: Three times a day (TID) | ORAL | Status: DC
  Administered 2013-08-25 – 2013-08-28 (×11): 40 mg
  Filled 2013-08-25 (×15): qty 10

## 2013-08-25 NOTE — Progress Notes (Signed)
UR completed.  Brody Bonneau, RN BSN MHA CCM Trauma/Neuro ICU Case Manager [phone]  

## 2013-08-25 NOTE — Progress Notes (Signed)
Subjective: Patient on a trach collar. No longer on IV sedation. Continues on tube feedings.  Objective: Vital signs in last 24 hours: Filed Vitals:   08/25/13 0500 08/25/13 0600 08/25/13 0700 08/25/13 0743  BP: 133/76 129/75 129/79 129/79  Pulse: 120 123 116   Temp:   98.8 F (37.1 C)   TempSrc:   Oral   Resp: 30 29 29    Height:      Weight:      SpO2: 100% 100% 100%     Intake/Output from previous day: 10/30 0701 - 10/31 0700 In: 2650 [I.V.:1725; NG/GT:875; IV Piggyback:50] Out: 1950 [Urine:1950] Intake/Output this shift:    Physical Exam:  Opening left is slightly to voice not following commands. No attempt to speech. Pupils 3 mm, round, sluggishly reactive to light. Corneals present bilaterally. No movement to deep central pain.  CBC  Recent Labs  08/24/13 0500 08/25/13 0458  WBC 19.0* 16.2*  HGB 9.7* 9.7*  HCT 28.2* 28.2*  PLT 357 462*   BMET  Recent Labs  08/24/13 0500 08/25/13 0458  NA 138 140  K 4.0 4.0  CL 102 104  CO2 24 24  GLUCOSE 108* 157*  BUN 11 14  CREATININE 0.69 0.63  CALCIUM 8.2* 8.1*   Studies/Results: Ir Lonia Chimera Convert Gastr-jej Per W/fl Mod Sed  08/24/2013   CLINICAL DATA:  TRAUMATIC BRAIN INJURY, TRAUMA, POOR NUTRITIONAL STATUS, HIGH GASTRIC RESIDUALS  EXAM: FLUOROSCOPIC INSERTION OF A GJ CONVERSION TUBE THROUGH THE EXISTING GASTROSTOMY  Date:  10/30/201410/30/2014 9:48 AM  Radiologist:  Judie Petit. Ruel Favors, MD  Guidance:  Thomasene Mohair.  MEDICATIONS AND MEDICAL HISTORY: NONE.  ANESTHESIA/SEDATION: NONE.  CONTRAST:  10mL OMNIPAQUE IOHEXOL 300 MG/ML  SOLN  FLUOROSCOPY TIME:  24 MIN 54 SECONDS  PROCEDURE: Informed consent was obtained from the patient following explanation of the procedure, risks, benefits and alternatives. The patient understands, agrees and consents for the procedure. All questions were addressed. A time out was performed.  Maximal barrier sterile technique utilized including caps, mask, sterile gowns, sterile gloves,  large sterile drape, hand hygiene, and BETADINE  Under sterile conditions, the existing gastrostomy tube was cannulated with a C2 catheter. C2 catheter and glidewire were utilized to manipulate the access into the duodenum. Despite multiple attempts, access could not be advanced more peripherally. The 37 French GJ conversion tube was advanced over the glidewire into the proximal duodenum. Contrast injection confirms position. Images obtained for documentation. No immediate complication.  COMPLICATIONS: NO IMMEDIATE  IMPRESSION: Fluoroscopic insertion of a 9 French GJ conversion tube through the existing gastrostomy. Tip could only be advanced into the proximal duodenum. ACCESS ready for use.   Electronically Signed   By: Ruel Favors M.D.   On: 08/24/2013 10:17    Assessment/Plan: Patient remains in coma. Now breathing without ventilator support. No signs of conscious response. Spoke with the patient's mother yesterday about condition.   Hewitt Shorts, MD 08/25/2013, 8:19 AM
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
## 2013-08-25 NOTE — Progress Notes (Signed)
Patient is on trach collar.  Oxygenating well.  Ventilator is out of the room.  Not following commands.  Dysconjugate gaze.  Pupils are sluggishly reactive.   WBC down to 16K, on Cipro for Enterobacter PNA.  TBI team in with patient currently.  This patient has been seen and I agree with the findings and treatment plan.  Marta Lamas. Gae Bon, MD, FACS [phone] (pager) [phone] (direct pager) Trauma Surgeon
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
## 2013-08-26 LAB — CULTURE, BLOOD (ROUTINE X 2)

## 2013-08-26 LAB — CBC
HCT: 26.1 % — ABNORMAL LOW (ref 39.0–52.0)
MCV: 92.2 fL (ref 78.0–100.0)
Platelets: 472 10*3/uL — ABNORMAL HIGH (ref 150–400)
RBC: 2.83 MIL/uL — ABNORMAL LOW (ref 4.22–5.81)
RDW: 15.4 % (ref 11.5–15.5)
WBC: 13.9 10*3/uL — ABNORMAL HIGH (ref 4.0–10.5)

## 2013-08-26 LAB — GLUCOSE, CAPILLARY
Glucose-Capillary: 135 mg/dL — ABNORMAL HIGH (ref 70–99)
Glucose-Capillary: 124 mg/dL — ABNORMAL HIGH (ref 70–99)
Glucose-Capillary: 127 mg/dL — ABNORMAL HIGH (ref 70–99)

## 2013-08-26 NOTE — Progress Notes (Signed)
Pt. Raising left arm off bed by self. Continues to be unresponsive and does not follow commands. Will continue to monitor. Wash clothes changed in bilateral grips.

## 2013-08-26 NOTE — Progress Notes (Signed)
Mother updated; no acute changes per MD note. RN will continue to monitor. Family stated pt. To have MRI, no order in place.
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
## 2013-08-26 NOTE — Progress Notes (Signed)
On trach collar  Temp:  [98.2 F (36.8 C)-102.8 F (39.3 C)] 101 F (38.3 C) (11/01 0402) Pulse Rate:  [93-108] 99 (11/01 0402) Resp:  [23-30] 30 (11/01 0402) BP: (109-128)/(54-81) 109/54 mmHg (11/01 0402) SpO2:  [98 %-100 %] 99 % (11/01 0402) FiO2 (%):  [28 %] 28 % (11/01 0402) Weight:  [50.1 kg (110 lb 7.2 oz)-53.7 kg (118 lb 6.2 oz)] 50.1 kg (110 lb 7.2 oz) (11/01 0500)  PE  - unchanged Plan: CPM

## 2013-08-26 NOTE — Progress Notes (Signed)
No acute changes today.  Cory [name]. Corliss Skains, MD, [facility]  General/ Trauma Surgery  08/26/2013 12:07 PM

## 2013-08-27 ENCOUNTER — Inpatient Hospital Stay (HOSPITAL_COMMUNITY): Payer: Medicaid Other

## 2013-08-27 LAB — GLUCOSE, CAPILLARY
Glucose-Capillary: 123 mg/dL — ABNORMAL HIGH (ref 70–99)
Glucose-Capillary: 139 mg/dL — ABNORMAL HIGH (ref 70–99)
Glucose-Capillary: 130 mg/dL — ABNORMAL HIGH (ref 70–99)

## 2013-08-27 LAB — BASIC METABOLIC PANEL
BUN: 16 mg/dL (ref 6–23)
CO2: 24 mEq/L (ref 19–32)
Calcium: 8.1 mg/dL — ABNORMAL LOW (ref 8.4–10.5)
Creatinine, Ser: 0.65 mg/dL (ref 0.50–1.35)
GFR calc Af Amer: >90 mL/min (ref >90)
Glucose, Bld: 133 mg/dL — ABNORMAL HIGH (ref 70–99)
Potassium: 4 mEq/L (ref 3.5–5.1)

## 2013-08-27 LAB — CBC
HCT: 27.5 % — ABNORMAL LOW (ref 39.0–52.0)
Hemoglobin: 9.4 g/dL — ABNORMAL LOW (ref 13.0–17.0)
MCH: 31.5 pg (ref 26.0–34.0)
MCV: 92.3 fL (ref 78.0–100.0)
Platelets: 475 10*3/uL — ABNORMAL HIGH (ref 150–400)
RBC: 2.98 MIL/uL — ABNORMAL LOW (ref 4.22–5.81)
RDW: 15.4 % (ref 11.5–15.5)
WBC: 15.6 10*3/uL — ABNORMAL HIGH (ref 4.0–10.5)

## 2013-08-27 LAB — URINALYSIS, ROUTINE W REFLEX MICROSCOPIC
Bilirubin Urine: NEGATIVE
Hgb urine dipstick: NEGATIVE
Ketones, ur: NEGATIVE mg/dL
Nitrite: NEGATIVE
Protein, ur: NEGATIVE mg/dL
Urobilinogen, UA: 1 mg/dL (ref 0.0–1.0)
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
## 2013-08-27 NOTE — Progress Notes (Signed)
Weekend CSW contacted patient's mother Cory [name] [phone]) regarding discharge planning for patient. CSW introduced herself and explained her role. CSW informed patient's mother that PT is recommending SNF. Patient's mother was agreeable, stating that she wanted to make sure her son was taken care of. Patient's mother prefers a facility in [place], as she does not have transportation to travel. CSW validated mother's concerns and informed mother that there were many SNF's in [place] but it can be challenging to find one that can care for trach patients. Mother understood, Weekday CSW to follow for discharge planning.  Samuella Bruin, MSW, LCSWA Clinical Social Worker [facility]. [phone]

## 2013-08-27 NOTE — Progress Notes (Addendum)
Physician notified: Ashok Norris, NP At: 1144  Regarding: Pt. Has PIV access. Sent UA. Want PICC DC and tip cultured? Awaiting return response.   Returned Response at: 1145  Order(s): Place order for PICC removal and sent for culture.
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
## 2013-08-28 ENCOUNTER — Inpatient Hospital Stay (HOSPITAL_COMMUNITY): Payer: Medicaid Other

## 2013-08-28 ENCOUNTER — Encounter (HOSPITAL_COMMUNITY): Payer: Self-pay | Admitting: Radiology

## 2013-08-28 LAB — CBC
HCT: 29.1 % — ABNORMAL LOW (ref 39.0–52.0)
Hemoglobin: 9.8 g/dL — ABNORMAL LOW (ref 13.0–17.0)
MCH: 30.9 pg (ref 26.0–34.0)
MCHC: 33.7 g/dL (ref 30.0–36.0)
MCV: 91.8 fL (ref 78.0–100.0)
RBC: 3.17 MIL/uL — ABNORMAL LOW (ref 4.22–5.81)

## 2013-08-28 LAB — BASIC METABOLIC PANEL
BUN: 16 mg/dL (ref 6–23)
CO2: 25 mEq/L (ref 19–32)
Calcium: 8.3 mg/dL — ABNORMAL LOW (ref 8.4–10.5)
Chloride: 107 mEq/L (ref 96–112)
Creatinine, Ser: 0.63 mg/dL (ref 0.50–1.35)
GFR calc Af Amer: >90 mL/min (ref >90)
Glucose, Bld: 120 mg/dL — ABNORMAL HIGH (ref 70–99)

## 2013-08-28 LAB — GLUCOSE, CAPILLARY
Glucose-Capillary: 135 mg/dL — ABNORMAL HIGH (ref 70–99)
Glucose-Capillary: 131 mg/dL — ABNORMAL HIGH (ref 70–99)
Glucose-Capillary: 117 mg/dL — ABNORMAL HIGH (ref 70–99)
Glucose-Capillary: 125 mg/dL — ABNORMAL HIGH (ref 70–99)
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
## 2013-08-28 NOTE — Progress Notes (Signed)
Patient down for CT of head as well as xrays of his left arm and left leg. Dr. Luiz Blare up to see the patient and removed the staples from the patient's left arm and leg and applied streri stripes and benzoine and then covered with 4x4's and wrapped both with kling and ace wraps. While working on the left leg Clinical research associate observed what appears to be skin tear vs. Stage 2 pressure sore.Measeurements obtained  And doc. Under wound. Dr. Luiz Blare suggested that we use xeroform gauze and cover with  4x4's and kling.
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
## 2013-08-28 NOTE — Progress Notes (Signed)
Subjective: 11 Days Post-Op Procedure(s) (LRB): PERCUTANEOUS TRACHEOSTOMY (N/A) Patient reports pain as not able to speak.    Objective: Vital signs in last 24 hours: Temp:  [98.7 F (37.1 C)-100.4 F (38 C)] 99.8 F (37.7 C) (11/03 1208) Pulse Rate:  [68-95] 77 (11/03 1228) Resp:  [19-25] 19 (11/03 1208) BP: (90-114)/(43-60) 90/60 mmHg (11/03 1208) SpO2:  [100 %] 100 % (11/03 1228) FiO2 (%):  [28 %] 28 % (11/03 1228) Weight:  [42.2 kg (93 lb 0.6 oz)] 42.2 kg (93 lb 0.6 oz) (11/03 0347)  Intake/Output from previous day: 11/02 0701 - 11/03 0700 In: 3405 [I.V.:1745; NG/GT:1260; IV Piggyback:400] Out: 2051 [Urine:2050; Stool:1] Intake/Output this shift: Total I/O In: -  Out: 850 [Urine:850]   Recent Labs  08/26/13 0430 08/27/13 0342 08/28/13 0424  HGB 8.9* 9.4* 9.8*    Recent Labs  08/27/13 0342 08/28/13 0424  WBC 15.6* 18.5*  RBC 2.98* 3.17*  HCT 27.5* 29.1*  PLT 475* 470*    Recent Labs  08/27/13 0342 08/28/13 0424  NA 138 141  K 4.0 4.2  CL 105 107  CO2 24 25  BUN 16 16  CREATININE 0.65 0.63  GLUCOSE 133* 120*  CALCIUM 8.1* 8.3*   No results found for this basename: LABPT, INR,  in the last 72 hours  passive rom l arm 0-120 deg// wound looks good.  staplles removed and steri-strips placed.   l leg passive rom 0-95 deg// no instability// wound looks good// staples removed and steri-strips placedl  Assessment/Plan: 11 Days Post-Op Procedure(s) (LRB): PERCUTANEOUS TRACHEOSTOMY (N/A) Up with therapy May begin passive rom of extremities and will get x-ray to assess allignment.  Jace Fermin L 08/28/2013, 3:50 PM
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
## 2013-08-29 ENCOUNTER — Inpatient Hospital Stay (HOSPITAL_COMMUNITY): Payer: Medicaid Other

## 2013-08-29 LAB — GLUCOSE, CAPILLARY: Glucose-Capillary: 118 mg/dL — ABNORMAL HIGH (ref 70–99)

## 2013-08-29 LAB — CULTURE, RESPIRATORY

## 2013-08-29 LAB — CBC
HCT: 28.5 % — ABNORMAL LOW (ref 39.0–52.0)
MCH: 31.3 pg (ref 26.0–34.0)
MCHC: 34 g/dL (ref 30.0–36.0)
MCV: 91.9 fL (ref 78.0–100.0)
Platelets: 418 10*3/uL — ABNORMAL HIGH (ref 150–400)
RDW: 15.5 % (ref 11.5–15.5)
WBC: 16.9 10*3/uL — ABNORMAL HIGH (ref 4.0–10.5)

## 2013-08-29 LAB — BASIC METABOLIC PANEL
BUN: 18 mg/dL (ref 6–23)
Calcium: 8.1 mg/dL — ABNORMAL LOW (ref 8.4–10.5)
Creatinine, Ser: 0.64 mg/dL (ref 0.50–1.35)
GFR calc non Af Amer: >90 mL/min (ref >90)
Glucose, Bld: 126 mg/dL — ABNORMAL HIGH (ref 70–99)
Sodium: 141 mEq/L (ref 135–145)

## 2013-08-29 LAB — CULTURE, RESPIRATORY W GRAM STAIN

## 2013-08-29 MED ORDER — ENOXAPARIN SODIUM 40 MG/0.4ML ~~LOC~~ SOLN
40.0000 mg | SUBCUTANEOUS | Status: DC
  Administered 2013-08-29 – 2013-09-13 (×16): 40 mg via SUBCUTANEOUS
  Filled 2013-08-29 (×16): qty 0.4

## 2013-08-29 MED ORDER — FREE WATER
200.0000 mL | Freq: Three times a day (TID) | Status: DC
  Administered 2013-08-29 – 2013-09-13 (×42): 200 mL

## 2013-08-29 MED ORDER — PROPRANOLOL HCL 20 MG/5ML PO SOLN
40.0000 mg | Freq: Four times a day (QID) | ORAL | Status: DC
  Administered 2013-08-29: 40 mg
  Filled 2013-08-29 (×8): qty 10

## 2013-08-29 MED ORDER — LOPERAMIDE HCL 1 MG/5ML PO LIQD
2.0000 mg | Freq: Four times a day (QID) | ORAL | Status: DC
  Administered 2013-08-29 – 2013-08-30 (×8): 2 mg
  Filled 2013-08-29 (×13): qty 10

## 2013-08-29 NOTE — Progress Notes (Signed)
UR completed.  Doyce Saling, RN BSN [facility] CCM Trauma/Neuro ICU Case Manager [phone]  

## 2013-08-29 NOTE — Progress Notes (Signed)
[place] with [facility]; reported bp 89/51 and was told to hold Inderal.  Also reported that 2 superficial blood clots were found in RUE; also verified that meds should be given through G tube and tube feed should be given through J  Tube.  Will continue to monitor.  Vivi Martens RN

## 2013-08-29 NOTE — Progress Notes (Signed)
Subjective: Patient continues on trach collar.  CT brain without contrast yesterday shows improvement in left temporal and right frontal hemorrhagic cerebral contusions.  Objective: Vital signs in last 24 hours: Filed Vitals:   08/29/13 0403 08/29/13 0410 08/29/13 0421 08/29/13 0443  BP:  93/54  96/54  Pulse: 84 84    Temp:  99.8 F (37.7 C)    TempSrc:  Oral    Resp: 17 20 21 21   Height:  5\' 3"  (1.6 m)    Weight:  42.865 kg (94 lb 8 oz)    SpO2: 100% 100% 100% 100%    Intake/Output from previous day: 11/03 0701 - 11/04 0700 In: 2435 [I.V.:825; NG/GT:1410; IV Piggyback:200] Out: 2575 [Urine:2475; Stool:100] Intake/Output this shift:    Physical Exam:  Opening eyes spontaneously and to stimulation, left greater than right. Spontaneous movement of left side, arm greater than leg. Not following commands. No attempts at speech.  CBC  Recent Labs  08/28/13 0424 08/29/13 0405  WBC 18.5* 16.9*  HGB 9.8* 9.7*  HCT 29.1* 28.5*  PLT 470* 418*   BMET  Recent Labs  08/28/13 0424 08/29/13 0405  NA 141 141  K 4.2 4.2  CL 107 105  CO2 25 23  GLUCOSE 120* 126*  BUN 16 18  CREATININE 0.63 0.64  CALCIUM 8.3* 8.1*    Studies/Results: Dg Tibia/fibula Left  08/28/2013   CLINICAL DATA:  Left proximal tibia fracture.  EXAM: LEFT TIBIA AND FIBULA - 2 VIEW  COMPARISON:  Radiographs dated 08/13/2013  FINDINGS: The patient has undergone open reduction and internal fixation of the comminuted fracture of the proximal tibia. Side plate and multiple screws have been inserted. Alignment and position of the fracture fragments is unchanged since the intraoperative images. Comminuted fracture of the proximal fibula is unchanged since the intraoperative images.  IMPRESSION: Stable appearance of the proximal tibia since open reduction and internal fixation on 08/13/2013.   Electronically Signed   By: Geanie Cooley M.D.   On: 08/28/2013 18:49   Ct Head Wo Contrast  08/28/2013   CLINICAL DATA:   32 year old male with cerebral contusions following head injury. Subsequent encounter.  EXAM: CT HEAD WITHOUT CONTRAST  TECHNIQUE: Contiguous axial images were obtained from the base of the skull through the vertex without intravenous contrast.  COMPARISON:  08/15/2013 and earlier.  FINDINGS: Improved paranasal sinus pneumatization. Continued fluid and bubbly opacity in the maxillary and sphenoid sinuses. Continued mild fluid in the posterior right mastoids. There is now near complete opacification of the left mastoid air cells.  Right sphenoid wing/posterior orbits skull fracture is mildly decreased in conspicuity compatible with some healing. Otherwise stable visualized osseous structures. Interval removal of the intracranial pressure monitor. No acute scalp or orbits soft tissue findings.  Interval decreased multifocal hyperdensity intracranial blood products. Residual edema in the left temporal lobe and anterior right frontal lobe. Continued mild mass effect on the left lateral ventricle. No ventriculomegaly. Basilar cisterns remain patent. No midline shift. Resolved right middle cranial fossa extra-axial hemorrhage. No new or increased hyperdense blood products. Underlying stable gray-white matter differentiation elsewhere. Dystrophic basal ganglia calcifications. No acute cortically based infarct.  IMPRESSION: 1. Regression / expected evolution of multifocal cerebral hemorrhagic contusions. Residual mild edema in the left temporal lobe and mild residual mass effect upon the left lateral ventricle. No ventriculomegaly or significant intracranial mass effect.  2.  No new intracranial abnormality.  3. Some healing of the right sphenoid wing/posterior orbital fracture. No new osseous abnormality identified.  4. Mildly improved paranasal sinus aeration. Increased left mastoid effusion.   Electronically Signed   By: Augusto Gamble M.D.   On: 08/28/2013 18:03   Dg Chest Port 1 View  08/29/2013   CLINICAL DATA:   Pneumonia  EXAM: PORTABLE CHEST - 1 VIEW  COMPARISON:  08/27/2013  FINDINGS: Cardiac shadow is stable. A tracheostomy tube is again noted. The lungs are clear bilaterally. No acute bony abnormality is seen. A previously noted right-sided PICC line is been removed in the interval.  IMPRESSION: No acute abnormality noted.   Electronically Signed   By: Alcide Clever M.D.   On: 08/29/2013 07:17   Dg Chest Port 1 View  08/27/2013   CLINICAL DATA:  Fever. Traumatic brain injury with intracranial hemorrhage. Respiratory failure.  EXAM: PORTABLE CHEST - 1 VIEW  COMPARISON:  08/22/2013  FINDINGS: Tracheostomy tube and right arm PICC line remain in appropriate position. No pneumothorax identified. Decreased atelectasis or infiltrate seen in the left lung base since prior exam. Right lung remains clear.  IMPRESSION: Resolving left basilar atelectasis or infiltrate since prior exam. No new abnormality identified.   Electronically Signed   By: Myles Rosenthal M.D.   On: 08/27/2013 13:37   Dg Humerus Left  08/28/2013   CLINICAL DATA:  Left humerus fracture.  EXAM: LEFT HUMERUS - 2+ VIEW  COMPARISON:  08/11/2013  FINDINGS: AP and lateral images demonstrate the patient has undergone open reduction and internal fixation of the comminuted fracture of the left humeral shaft. Alignment is anatomic. There small bone fragments in the soft tissues under the skin surface laterally.  IMPRESSION: Open reduction and internal fixation of left humeral shaft fracture. Bone fragments in the subcutaneous soft tissues laterally.   Electronically Signed   By: Geanie Cooley M.D.   On: 08/28/2013 18:19    Assessment/Plan: Spoke with Dr. Violeta Gelinas from trauma surgery regarding question of Lovenox for DVT prophylaxis. We discussed that there is no absolute elimination of risk from the Lovenox potentially contributing to intracranial hemorrhage, but he plans on using a prophylactic dose of 40 mg per day, and that balancing the low risk of  intracranial hemorrhage for that dose to the increasing risk of VTE complications, we both feel that it is reasonable to initiate Lovenox prophylaxis.  With gradually improving neurologic response he would benefit from coma stimulation rehabilitation program.  Hewitt Shorts, MD 08/29/2013, 8:26 AM
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
## 2013-08-29 NOTE — Progress Notes (Signed)
Seemed to move arm to command. D/W Dr. Newell Coral - OK to start Lovenox daily. Patient examined and I agree with the assessment and plan  Violeta Gelinas, MD, MPH, FACS Pager: [phone]  08/29/2013 12:56 PM

## 2013-08-29 NOTE — Clinical Social Work Note (Signed)
Clinical Social Worker continuing to follow patient and family for support and discharge planning needs.  No family at bedside during CSW attempt to visit.  CSW attempted to reach patient mother x2 over the phone - unable to leave message due to voicemail issues.  CSW will continue to reach out to patient mother to further discuss patient plans at discharge.  No SNF beds available locally at this time.  Patient mother may opt to try and take patient home if local facilities are unable to accept.  CSW remains available for support and to facilitate patient discharge needs once medically stable and bed available.  [name], [place] [phone]

## 2013-08-29 NOTE — Progress Notes (Signed)
Report received from 3S.

## 2013-08-29 NOTE — Progress Notes (Signed)
Patient ID: Cory [name], male   DOB: May 14, 1981, 32 y.o.   MRN: [id number] X-rays reviewed and show excellent allignment of humerus fracture.   allignment of tibia fracture is excellent but interval gap has formed at fracture lines.    Both fractures need additional time to heal.  Arm should remain in splint all times--L arm may be removed from splint by OT for Rom passive and supported at the forearm up to one time daily. l leg should remain in knee immobilizer currently.  No rom for l. Leg currently.

## 2013-08-29 NOTE — Progress Notes (Signed)
OT NOTE  MD please place order for OT PROM of LT UE. Please provide detail for PROM ranges that are safe for patient . Ot to range patient to order details and educate RN staff after order placed.  Thank you   Mateo Flow   OTR/L Pager: [phone] Office: [phone] .
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
## 2013-08-29 NOTE — Progress Notes (Signed)
Right upper and bilateral lower extremity venous duplex completed.  Right upper extremity:  No evidence of DVT.  There appears to be superficial thrombosis in the cephalic and basilic veins.  The antecubital communicating vein appears patent.  Left subclavian and axillary veins appear patent.  The rest of the veins in the left arm are not visualized due to splint.  Bilateral lower extremity:  No evidence of DVT, superficial thrombosis, or Baker's Cyst.

## 2013-08-30 DIAGNOSIS — D72829 Elevated white blood cell count, unspecified: Secondary | ICD-10-CM

## 2013-08-30 DIAGNOSIS — IMO0002 Reserved for concepts with insufficient information to code with codable children: Secondary | ICD-10-CM

## 2013-08-30 DIAGNOSIS — S069X9A Unspecified intracranial injury with loss of consciousness of unspecified duration, initial encounter: Secondary | ICD-10-CM

## 2013-08-30 LAB — CBC
HCT: 28.5 % — ABNORMAL LOW (ref 39.0–52.0)
MCHC: 33.7 g/dL (ref 30.0–36.0)
MCV: 92.8 fL (ref 78.0–100.0)
Platelets: 387 10*3/uL (ref 150–400)
RBC: 3.07 MIL/uL — ABNORMAL LOW (ref 4.22–5.81)
RDW: 15.5 % (ref 11.5–15.5)
WBC: 16.5 10*3/uL — ABNORMAL HIGH (ref 4.0–10.5)

## 2013-08-30 LAB — GLUCOSE, CAPILLARY
Glucose-Capillary: 137 mg/dL — ABNORMAL HIGH (ref 70–99)
Glucose-Capillary: 128 mg/dL — ABNORMAL HIGH (ref 70–99)
Glucose-Capillary: 133 mg/dL — ABNORMAL HIGH (ref 70–99)
Glucose-Capillary: 98 mg/dL (ref 70–99)
Glucose-Capillary: 105 mg/dL — ABNORMAL HIGH (ref 70–99)
Glucose-Capillary: 106 mg/dL — ABNORMAL HIGH (ref 70–99)

## 2013-08-30 MED ORDER — VITAL AF 1.2 CAL PO LIQD
1000.0000 mL | ORAL | Status: DC
  Administered 2013-08-30 – 2013-09-12 (×12): 1000 mL
  Filled 2013-08-30 (×29): qty 1000

## 2013-08-30 MED ORDER — WHITE PETROLATUM GEL
Status: AC
  Administered 2013-08-30: 18:00:00 via TOPICAL
  Filled 2013-08-30: qty 5

## 2013-08-30 NOTE — Progress Notes (Signed)
Subjective: Patient continues on trach collar.  Objective: Vital signs in last 24 hours: Filed Vitals:   08/30/13 0800 08/30/13 0930 08/30/13 1156 08/30/13 1308  BP:  104/66  115/62  Pulse: 81 86 80 84  Temp:  98.2 F (36.8 C)  98.1 F (36.7 C)  TempSrc:  Axillary  Axillary  Resp: 20 20 20 20   Height:      Weight:      SpO2: 100% 100% 100% 100%    Intake/Output from previous day: 11/04 0701 - 11/05 0700 In: 1085 [I.V.:375; NG/GT:510; IV Piggyback:200] Out: 1500 [Urine:1500] Intake/Output this shift: Total I/O In: 1470 [I.V.:10; Other:200; NG/GT:1260] Out: 500 [Urine:500]  Physical Exam:  Opening eyes, left greater than right. Spontaneous movement on left. Pupils 3 mm, round, reactive to light. Not following commands. No attempts at speech.  CBC  Recent Labs  08/29/13 0405 08/30/13 0610  WBC 16.9* 16.5*  HGB 9.7* 9.6*  HCT 28.5* 28.5*  PLT 418* 387   BMET  Recent Labs  08/28/13 0424 08/29/13 0405  NA 141 141  K 4.2 4.2  CL 107 105  CO2 25 23  GLUCOSE 120* 126*  BUN 16 18  CREATININE 0.63 0.64  CALCIUM 8.3* 8.1*    Studies/Results: Dg Tibia/fibula Left  08/28/2013   CLINICAL DATA:  Left proximal tibia fracture.  EXAM: LEFT TIBIA AND FIBULA - 2 VIEW  COMPARISON:  Radiographs dated 08/13/2013  FINDINGS: The patient has undergone open reduction and internal fixation of the comminuted fracture of the proximal tibia. Side plate and multiple screws have been inserted. Alignment and position of the fracture fragments is unchanged since the intraoperative images. Comminuted fracture of the proximal fibula is unchanged since the intraoperative images.  IMPRESSION: Stable appearance of the proximal tibia since open reduction and internal fixation on 08/13/2013.   Electronically Signed   By: Geanie Cooley M.D.   On: 08/28/2013 18:49   Ct Head Wo Contrast  08/28/2013   CLINICAL DATA:  32 year old male with cerebral contusions following head injury. Subsequent encounter.   EXAM: CT HEAD WITHOUT CONTRAST  TECHNIQUE: Contiguous axial images were obtained from the base of the skull through the vertex without intravenous contrast.  COMPARISON:  08/15/2013 and earlier.  FINDINGS: Improved paranasal sinus pneumatization. Continued fluid and bubbly opacity in the maxillary and sphenoid sinuses. Continued mild fluid in the posterior right mastoids. There is now near complete opacification of the left mastoid air cells.  Right sphenoid wing/posterior orbits skull fracture is mildly decreased in conspicuity compatible with some healing. Otherwise stable visualized osseous structures. Interval removal of the intracranial pressure monitor. No acute scalp or orbits soft tissue findings.  Interval decreased multifocal hyperdensity intracranial blood products. Residual edema in the left temporal lobe and anterior right frontal lobe. Continued mild mass effect on the left lateral ventricle. No ventriculomegaly. Basilar cisterns remain patent. No midline shift. Resolved right middle cranial fossa extra-axial hemorrhage. No new or increased hyperdense blood products. Underlying stable gray-white matter differentiation elsewhere. Dystrophic basal ganglia calcifications. No acute cortically based infarct.  IMPRESSION: 1. Regression / expected evolution of multifocal cerebral hemorrhagic contusions. Residual mild edema in the left temporal lobe and mild residual mass effect upon the left lateral ventricle. No ventriculomegaly or significant intracranial mass effect.  2.  No new intracranial abnormality.  3. Some healing of the right sphenoid wing/posterior orbital fracture. No new osseous abnormality identified.  4. Mildly improved paranasal sinus aeration. Increased left mastoid effusion.   Electronically Signed  By: Augusto Gamble M.D.   On: 08/28/2013 18:03   Dg Chest Port 1 View  08/29/2013   CLINICAL DATA:  Pneumonia  EXAM: PORTABLE CHEST - 1 VIEW  COMPARISON:  08/27/2013  FINDINGS: Cardiac shadow is  stable. A tracheostomy tube is again noted. The lungs are clear bilaterally. No acute bony abnormality is seen. A previously noted right-sided PICC line is been removed in the interval.  IMPRESSION: No acute abnormality noted.   Electronically Signed   By: Alcide Clever M.D.   On: 08/29/2013 07:17   Dg Humerus Left  08/28/2013   CLINICAL DATA:  Left humerus fracture.  EXAM: LEFT HUMERUS - 2+ VIEW  COMPARISON:  08/11/2013  FINDINGS: AP and lateral images demonstrate the patient has undergone open reduction and internal fixation of the comminuted fracture of the left humeral shaft. Alignment is anatomic. There small bone fragments in the soft tissues under the skin surface laterally.  IMPRESSION: Open reduction and internal fixation of left humeral shaft fracture. Bone fragments in the subcutaneous soft tissues laterally.   Electronically Signed   By: Geanie Cooley M.D.   On: 08/28/2013 18:19    Assessment/Plan: Patient neurologically stable. Spoke with Dr. Lindie Spruce regarding disposition. Discussed coma stimulation program either here at [facility] rehabilitation or at a tertiary rehabilitation center. Notes in the chart suggest that there is consideration for discharged to home with his mother. This is certainly unrealistic considering the severity of his deficits, the extent of his needs, and his mother's disabilities. Discharged to home with his mother will undoubtedly fail.  Hewitt Shorts, MD 08/30/2013, 3:38 PM
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
## 2013-08-30 NOTE — Progress Notes (Signed)
ATC setup changed 
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
## 2013-08-30 NOTE — Progress Notes (Signed)
Rehab Admissions Coordinator Note:  Patient was screened by Trish Mage for appropriateness for an Inpatient Acute Rehab Consult.  At this time, we are recommending Inpatient Rehab consult.  Trish Mage 08/30/2013, 12:20 PM  I can be reached at [phone].
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
## 2013-08-30 NOTE — Clinical Social Work Note (Signed)
Clinical Social Worker continuing to follow patient and family for support and discharge planning needs.  CSW has attempted to contact patient mother x3 today with no answer and inability to leave a voicemail.  Per RN, patient mother has not been at bedside today.  CSW remains available for support, however will need decision maker for discharge plans.  No SNF beds available at this time.  CSW to facilitate patient discharge once bed available.  18:10 Patient mother contacted CSW over the phone.  CSW explained the necessity of SNF placement for patient at discharge.  Patient mother has been notified of the possibility of placement anywhere in the state at this time.  Patient mother is very unhappy but did agree to distance placement.  CSW to continue with SNF search for LOG facility placement.  CSW available for support as needed.  [name], [place] [phone]
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
## 2013-08-30 NOTE — Progress Notes (Signed)
Patient did follow some commands for me today.  He attempted to show me two finders on his left hand (showed me one) and he squeezed my hand to command.  Dr. Newell Coral wants to have the patient considered for rehab, but I believe that he is much too low level for our rehab at this point.  I will let them decide.  Will get Rehab consultation.  This patient has been seen and I agree with the findings and treatment plan.  Marta Lamas. Gae Bon, MD, FACS [phone] (pager) [phone] (direct pager) Trauma Surgeon

## 2013-08-31 LAB — CBC
HCT: 29 % — ABNORMAL LOW (ref 39.0–52.0)
MCHC: 33.8 g/dL (ref 30.0–36.0)
MCV: 93.2 fL (ref 78.0–100.0)
RBC: 3.11 MIL/uL — ABNORMAL LOW (ref 4.22–5.81)
RDW: 15.6 % — ABNORMAL HIGH (ref 11.5–15.5)
WBC: 14.3 10*3/uL — ABNORMAL HIGH (ref 4.0–10.5)

## 2013-08-31 MED ORDER — LOPERAMIDE HCL 1 MG/5ML PO LIQD
4.0000 mg | Freq: Four times a day (QID) | ORAL | Status: DC
  Administered 2013-08-31 (×4): 4 mg
  Filled 2013-08-31 (×9): qty 20

## 2013-08-31 NOTE — Progress Notes (Signed)
Subjective: Patient continues on trach collar, continues on tube feedings via PEG.  Objective: Vital signs in last 24 hours: Filed Vitals:   08/31/13 0442 08/31/13 0829 08/31/13 1157 08/31/13 1420  BP: 105/59   121/62  Pulse: 90 94 88 106  Temp: 98 F (36.7 C)   98.1 F (36.7 C)  TempSrc: Axillary   Axillary  Resp: 18 18 18 20   Height:      Weight:      SpO2: 98% 100% 100% 99%    Intake/Output from previous day: 11/05 0701 - 11/06 0700 In: 2432.7 [I.V.:10; NG/GT:2222.7] Out: 1700 [Urine:1600; Stool:100] Intake/Output this shift:    Physical Exam:  Opens eyes spontaneously and to voice, left greater than right. Spontaneous movement of left upper extremity. Not following commands. No attempt at speech.  CBC  Recent Labs  08/30/13 0610 08/31/13 0510  WBC 16.5* 14.3*  HGB 9.6* 9.8*  HCT 28.5* 29.0*  PLT 387 350   BMET  Recent Labs  08/29/13 0405  NA 141  K 4.2  CL 105  CO2 23  GLUCOSE 126*  BUN 18  CREATININE 0.64  CALCIUM 8.1*    Assessment/Plan: Was following commands upper with this week, but has not done so for the past couple of days. CT of brain shows expected evolution of  hemorrhagic cerebral contusions. Neurologically without change.   Hewitt Shorts, MD 08/31/2013, 3:07 PM

## 2013-08-31 NOTE — Clinical Social Work Note (Signed)
Clinical Social Worker has extended SNF search to [facility] and [facility].  Patient mother aware of placement options anywhere in the state of [place].  CSW will continue to follow up with facilities and discuss patient needs at discharge.  CSW remains available for support and to facilitate patient discharge needs once medically stable and bed available.  Patient is currently Medicaid pending.  [name], [place] [phone]

## 2013-08-31 NOTE — Progress Notes (Signed)
The demands of this patient at home are tremendous.  I am not sure if the mother will be able to handle it, but is she can she will have to demonstrated this prior to discharge.  This patient has been seen and I agree with the findings and treatment plan.  Marta Lamas. Gae Bon, MD, FACS [phone] (pager) [phone] (direct pager) Trauma Surgeon

## 2013-08-31 NOTE — Clinical Social Work Placement (Signed)
Clinical Social Work Department CLINICAL SOCIAL WORK PLACEMENT NOTE 08/29/2013  Patient:  Cory [name], Cory [name]  Account Number:  [id number] Admit date:  08/11/2013  Clinical Social Worker:  [name], LCSW  Date/time:  08/29/2013 04:00 PM  Clinical Social Work is seeking post-discharge placement for this patient at the following level of care:   SKILLED NURSING   (*CSW will update this form in Epic as items are completed)   08/29/2013  Patient/family provided with [facility] Department of Clinical Social Work's list of facilities offering this level of care within the geographic area requested by the patient (or if unable, by the patient's family).  08/29/2013  Patient/family informed of their freedom to choose among providers that offer the needed level of care, that participate in Medicare, Medicaid or managed care program needed by the patient, have an available bed and are willing to accept the patient.  08/29/2013  Patient/family informed of [facility]' ownership interest in [facility], as well as of the fact that they are under no obligation to receive care at this facility.  PASARR submitted to EDS on 08/29/2013 PASARR number received from EDS on 08/29/2013  FL2 transmitted to all facilities in geographic area requested by pt/family on  08/29/2013 FL2 transmitted to all facilities within larger geographic area on 08/31/2013  Patient informed that his/her managed care company has contracts with or will negotiate with  certain facilities, including the following:     Patient/family informed of bed offers received:   Patient chooses bed at  Physician recommends and patient chooses bed at    Patient to be transferred to  on   Patient to be transferred to facility by   The following physician request were entered in Epic:   Additional Comments: 11/06 Patient is LOG placement
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
## 2013-08-31 NOTE — Progress Notes (Signed)
Rehab admissions - Evaluated for possible admission.  Please see rehab consult done yesterday by Dr. Riley Kill.  Not able to tolerate acute inpatient rehab therapies yet.  Will follow along for progress.  Call me for questions.  #[phone]
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
## 2013-09-01 ENCOUNTER — Inpatient Hospital Stay (HOSPITAL_COMMUNITY): Payer: Medicaid Other

## 2013-09-01 DIAGNOSIS — S065X9A Traumatic subdural hemorrhage with loss of consciousness of unspecified duration, initial encounter: Secondary | ICD-10-CM

## 2013-09-01 LAB — BASIC METABOLIC PANEL
BUN: 21 mg/dL (ref 6–23)
Chloride: 106 mEq/L (ref 96–112)
Creatinine, Ser: 0.68 mg/dL (ref 0.50–1.35)
GFR calc Af Amer: >90 mL/min (ref >90)
GFR calc non Af Amer: >90 mL/min (ref >90)
Glucose, Bld: 114 mg/dL — ABNORMAL HIGH (ref 70–99)

## 2013-09-01 LAB — CBC
HCT: 33.5 % — ABNORMAL LOW (ref 39.0–52.0)
Hemoglobin: 11.1 g/dL — ABNORMAL LOW (ref 13.0–17.0)
MCHC: 33.1 g/dL (ref 30.0–36.0)
MCV: 94.9 fL (ref 78.0–100.0)
RDW: 15.8 % — ABNORMAL HIGH (ref 11.5–15.5)
WBC: 16.6 10*3/uL — ABNORMAL HIGH (ref 4.0–10.5)

## 2013-09-01 MED ORDER — SACCHAROMYCES BOULARDII 250 MG PO CAPS
250.0000 mg | ORAL_CAPSULE | Freq: Two times a day (BID) | ORAL | Status: DC
  Administered 2013-09-01 – 2013-09-11 (×22): 250 mg via ORAL
  Filled 2013-09-01 (×27): qty 1
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
## 2013-09-01 NOTE — Progress Notes (Signed)
Rehab admissions - Noted patient has not been following commands for the past 3 days.  He may need SNF for a prolonged rehab course.  Call me for questions.  #[phone]

## 2013-09-01 NOTE — Progress Notes (Signed)
No response.  Placement a concern.  This patient has been seen and I agree with the findings and treatment plan.  Marta Lamas. Gae Bon, MD, FACS [phone] (pager) [phone] (direct pager) Trauma Surgeon
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
## 2013-09-01 NOTE — Progress Notes (Signed)
Subjective: Continues on trach collar, continues on tube feedings. Moderate tracheobronchial hemorrhagic secretions.  Objective: Vital signs in last 24 hours: Filed Vitals:   08/31/13 2343 09/01/13 0259 09/01/13 0335 09/01/13 0557  BP:  104/55  110/60  Pulse: 84 91  104  Temp:  98.6 F (37 C)  100 F (37.8 C)  TempSrc:  Axillary  Axillary  Resp: 16 18  16   Height:      Weight:      SpO2: 100% 100% 100% 99%    Intake/Output from previous day: 11/06 0701 - 11/07 0700 In: -  Out: 1550 [Urine:700; Drains:850] Intake/Output this shift:    Physical Exam:  Patient opening eyes spontaneously and to voice, left greater than right. Spontaneous movement of left upper extremity.  CBC  Recent Labs  08/31/13 0510 09/01/13 0545  WBC 14.3* 16.6*  HGB 9.8* 11.1*  HCT 29.0* 33.5*  PLT 350 299   BMET  Recent Labs  09/01/13 0545  NA 145  K 4.3  CL 106  CO2 27  GLUCOSE 114*  BUN 21  CREATININE 0.68  CALCIUM 8.7     Assessment/Plan: Neurologically without change, has not follow commands for 3 days. Recovery process has slowed, possibly plateaued.   Hewitt Shorts, MD 09/01/2013, 7:38 AM

## 2013-09-02 ENCOUNTER — Inpatient Hospital Stay (HOSPITAL_COMMUNITY): Payer: Medicaid Other

## 2013-09-02 LAB — COMPREHENSIVE METABOLIC PANEL
ALT: 57 U/L — ABNORMAL HIGH (ref 0–53)
AST: 37 U/L (ref 0–37)
Alkaline Phosphatase: 169 U/L — ABNORMAL HIGH (ref 39–117)
CO2: 30 mEq/L (ref 19–32)
Chloride: 103 mEq/L (ref 96–112)
GFR calc non Af Amer: >90 mL/min (ref >90)
Total Bilirubin: 0.7 mg/dL (ref 0.3–1.2)

## 2013-09-02 LAB — URINALYSIS, ROUTINE W REFLEX MICROSCOPIC
Bilirubin Urine: NEGATIVE
Hgb urine dipstick: NEGATIVE
Protein, ur: NEGATIVE mg/dL
Urobilinogen, UA: 0.2 mg/dL (ref 0.0–1.0)

## 2013-09-02 LAB — GLUCOSE, CAPILLARY: Glucose-Capillary: 129 mg/dL — ABNORMAL HIGH (ref 70–99)

## 2013-09-02 LAB — CBC
HCT: 30.5 % — ABNORMAL LOW (ref 39.0–52.0)
Hemoglobin: 10.2 g/dL — ABNORMAL LOW (ref 13.0–17.0)
MCH: 31.3 pg (ref 26.0–34.0)
MCHC: 33.4 g/dL (ref 30.0–36.0)
MCV: 93.6 fL (ref 78.0–100.0)
RBC: 3.26 MIL/uL — ABNORMAL LOW (ref 4.22–5.81)
RDW: 15.4 % (ref 11.5–15.5)

## 2013-09-02 NOTE — Progress Notes (Signed)
No new output from the rectal tube; flexiseal has been in place since 08-25-13 due to loose stools; deflated the 45ml water balloon and discontinued the rectal tube; stool specimen taken from the tubing closest to the patient; no new loose stool; tubing is dry; will monitor for return of loose stools and patient's skin care to the sacral area; Cdiff specimen is negative today; patient less restless with the tube removed; will monitor.
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
## 2013-09-03 DIAGNOSIS — E46 Unspecified protein-calorie malnutrition: Secondary | ICD-10-CM

## 2013-09-03 DIAGNOSIS — D72829 Elevated white blood cell count, unspecified: Secondary | ICD-10-CM

## 2013-09-03 LAB — GLUCOSE, CAPILLARY
Glucose-Capillary: 106 mg/dL — ABNORMAL HIGH (ref 70–99)
Glucose-Capillary: 114 mg/dL — ABNORMAL HIGH (ref 70–99)
Glucose-Capillary: 116 mg/dL — ABNORMAL HIGH (ref 70–99)
Glucose-Capillary: 120 mg/dL — ABNORMAL HIGH (ref 70–99)
Glucose-Capillary: 84 mg/dL (ref 70–99)

## 2013-09-03 LAB — CBC WITH DIFFERENTIAL/PLATELET
Eosinophils Absolute: 1 10*3/uL — ABNORMAL HIGH (ref 0.0–0.7)
Eosinophils Relative: 5 % (ref 0–5)
Hemoglobin: 9.6 g/dL — ABNORMAL LOW (ref 13.0–17.0)
Lymphs Abs: 1.1 10*3/uL (ref 0.7–4.0)
MCH: 31.2 pg (ref 26.0–34.0)
MCV: 94.8 fL (ref 78.0–100.0)
Monocytes Relative: 8 % (ref 3–12)
Neutro Abs: 16.7 10*3/uL — ABNORMAL HIGH (ref 1.7–7.7)
Neutrophils Relative %: 81 % — ABNORMAL HIGH (ref 43–77)
RBC: 3.08 MIL/uL — ABNORMAL LOW (ref 4.22–5.81)

## 2013-09-03 NOTE — Progress Notes (Signed)
I have seen and examined the pt and agree with PA-jeffery's progress note. May benefit from ID consult for elev WBC, no source localized. Cont' current care
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
## 2013-09-04 DIAGNOSIS — R404 Transient alteration of awareness: Secondary | ICD-10-CM

## 2013-09-04 LAB — GLUCOSE, CAPILLARY
Glucose-Capillary: 101 mg/dL — ABNORMAL HIGH (ref 70–99)
Glucose-Capillary: 101 mg/dL — ABNORMAL HIGH (ref 70–99)
Glucose-Capillary: 107 mg/dL — ABNORMAL HIGH (ref 70–99)
Glucose-Capillary: 107 mg/dL — ABNORMAL HIGH (ref 70–99)
Glucose-Capillary: 113 mg/dL — ABNORMAL HIGH (ref 70–99)
Glucose-Capillary: 83 mg/dL (ref 70–99)
Glucose-Capillary: 97 mg/dL (ref 70–99)

## 2013-09-04 LAB — CBC
Hemoglobin: 10.1 g/dL — ABNORMAL LOW (ref 13.0–17.0)
MCH: 31.7 pg (ref 26.0–34.0)
MCV: 95 fL (ref 78.0–100.0)
Platelets: 218 10*3/uL (ref 150–400)
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
## 2013-09-04 NOTE — Progress Notes (Signed)
Subjective: Patient continues on his trach collar and tube feedings.  Objective: Vital signs in last 24 hours: Filed Vitals:   09/04/13 0200 09/04/13 0300 09/04/13 0932 09/04/13 1006  BP: 105/65   99/64  Pulse: 80 83 102 98  Temp: 98 F (36.7 C)   98.2 F (36.8 C)  TempSrc: Axillary   Axillary  Resp: 18 16 18 20   Height:      Weight:      SpO2: 100%  100% 98%    Intake/Output from previous day: 11/09 0701 - 11/10 0700 In: -  Out: 1500 [Urine:1500] Intake/Output this shift: Total I/O In: -  Out: 250 [Urine:250]  Physical Exam:  Opening eyes spontaneously, left greater than right. Moving the left upper and lower extremity actively, and little in the way of right-sided movement. Not following commands. No attempt to speak.  CBC  Recent Labs  09/03/13 0618 09/04/13 0520  WBC 20.6* 18.1*  HGB 9.6* 10.1*  HCT 29.2* 30.3*  PLT 225 218   BMET  Recent Labs  09/02/13 1430  NA 143  K 3.8  CL 103  CO2 30  GLUCOSE 122*  BUN 19  CREATININE 0.52  CALCIUM 8.6    Studies/Results: Dg Chest Port 1 View  09/02/2013   CLINICAL DATA:  Leukocytosis.  EXAM: PORTABLE CHEST - 1 VIEW  COMPARISON:  08/29/2013 and 08/27/2013  FINDINGS: Tracheostomy tube unchanged. Lungs are adequately inflated without consolidation or effusion. The cardiomediastinal silhouette and remainder of the exam is unchanged.  IMPRESSION: No active disease.   Electronically Signed   By: Elberta Fortis M.D.   On: 09/02/2013 15:09    Assessment/Plan: Neurologically stable. To continue to receive PT and OT. No new neurosurgical recommendations.   Hewitt Shorts, MD 09/04/2013, 11:09 AM

## 2013-09-04 NOTE — Progress Notes (Signed)
MD was paged about the order placed in concerning the trach. No call back, therefore will wait till tomorrow to discuss the order with the MD. Patient looks slightly active, moving from side to side. Will continue to monitor.

## 2013-09-04 NOTE — Progress Notes (Signed)
Trauma Service Note  Subjective: Patient is very "antsy" this AM.  Rolling to his right side and back and forth.  Will not follow commands for me today, but a lot of purposeful movement.  Objective: Vital signs in last 24 hours: Temp:  [97.2 F (36.2 C)-98.3 F (36.8 C)] 98.2 F (36.8 C) (11/10 1006) Pulse Rate:  [80-102] 98 (11/10 1006) Resp:  [16-20] 20 (11/10 1006) BP: (99-111)/(63-65) 99/64 mmHg (11/10 1006) SpO2:  [98 %-100 %] 98 % (11/10 1006) FiO2 (%):  [28 %] 28 % (11/10 0932) Last BM Date: 09/01/13  Intake/Output from previous day: 11/09 0701 - 11/10 0700 In: -  Out: 1500 [Urine:1500] Intake/Output this shift: Total I/O In: -  Out: 250 [Urine:250]  General: does not appear to be in distress  Lungs: Clear to ausculation  Abd: Tolerating tube feedings well.  Extremities: No changes  Neuro: Favoring his left side, not moving his right very much at all.  Lab Results: CBC   Recent Labs  09/03/13 0618 09/04/13 0520  WBC 20.6* 18.1*  HGB 9.6* 10.1*  HCT 29.2* 30.3*  PLT 225 218   BMET  Recent Labs  09/02/13 1430  NA 143  K 3.8  CL 103  CO2 30  GLUCOSE 122*  BUN 19  CREATININE 0.52  CALCIUM 8.6   PT/INR No results found for this basename: LABPROT, INR,  in the last 72 hours ABG No results found for this basename: PHART, PCO2, PO2, HCO3,  in the last 72 hours  Studies/Results: Dg Chest Port 1 View  09/02/2013   CLINICAL DATA:  Leukocytosis.  EXAM: PORTABLE CHEST - 1 VIEW  COMPARISON:  08/29/2013 and 08/27/2013  FINDINGS: Tracheostomy tube unchanged. Lungs are adequately inflated without consolidation or effusion. The cardiomediastinal silhouette and remainder of the exam is unchanged.  IMPRESSION: No active disease.   Electronically Signed   By: Elberta Fortis M.D.   On: 09/02/2013 15:09    Anti-infectives: Anti-infectives   Start     Dose/Rate Route Frequency Ordered Stop   08/24/13 1600  ciprofloxacin (CIPRO) IVPB 400 mg     400 mg 200  mL/hr over 60 Minutes Intravenous 2 times daily 08/24/13 1319 08/29/13 1200   08/22/13 1000  erythromycin (EES) 400 MG/5ML suspension 400 mg  Status:  Discontinued     400 mg Per Tube 3 times per day 08/22/13 0757 08/29/13 0747   08/22/13 0000  vancomycin (VANCOCIN) IVPB 1000 mg/200 mL premix  Status:  Discontinued     1,000 mg 200 mL/hr over 60 Minutes Intravenous 4 times per day 08/21/13 1834 08/23/13 1042   08/20/13 1000  vancomycin (VANCOCIN) IVPB 750 mg/150 ml premix  Status:  Discontinued     750 mg 150 mL/hr over 60 Minutes Intravenous Every 8 hours 08/20/13 0915 08/21/13 1834   08/17/13 1100  piperacillin-tazobactam (ZOSYN) IVPB 3.375 g  Status:  Discontinued     3.375 g 12.5 mL/hr over 240 Minutes Intravenous Every 8 hours 08/17/13 0938 08/24/13 1319   08/17/13 1000  vancomycin (VANCOCIN) IVPB 750 mg/150 ml premix  Status:  Discontinued     750 mg 150 mL/hr over 60 Minutes Intravenous Every 8 hours 08/17/13 0936 08/18/13 1014   08/13/13 2200  ceFAZolin (ANCEF) IVPB 2 g/50 mL premix     2 g 100 mL/hr over 30 Minutes Intravenous 3 times per day 08/13/13 1930 08/15/13 1522   08/12/13 0600  ceFAZolin (ANCEF) IVPB 2 g/50 mL premix  Status:  Discontinued  2 g 100 mL/hr over 30 Minutes Intravenous 3 times per day 08/12/13 0047 08/13/13 1930   08/11/13 2245  ceFAZolin (ANCEF) IVPB 2 g/50 mL premix     2 g 100 mL/hr over 30 Minutes Intravenous  Once 08/11/13 2239 08/11/13 2323      Assessment/Plan: s/p Procedure(s): PERCUTANEOUS TRACHEOSTOMY Continue current treatment.  LOS: 24 days   Marta Lamas. Gae Bon, MD, FACS [phone] Trauma Surgeon 09/04/2013

## 2013-09-05 LAB — GLUCOSE, CAPILLARY: Glucose-Capillary: 110 mg/dL — ABNORMAL HIGH (ref 70–99)

## 2013-09-05 NOTE — Progress Notes (Signed)
Orthopedic Tech Progress Note Patient Details:  Cory [name] 07/08/1981 [id number]  Ortho Devices Type of Ortho Device: Long arm splint Ortho Device/Splint Location: lle Ortho Device/Splint Interventions: Application   [name], [name] 09/05/2013, 3:07 PM

## 2013-09-05 NOTE — Progress Notes (Signed)
Subjective: 19 Days Post-Op Procedure(s) (LRB): PERCUTANEOUS TRACHEOSTOMY (N/A) Patient reports pain as not able to communicate pain level.  pt some what agitated and thrashing.    Objective: Vital signs in last 24 hours: Temp:  [98 F (36.7 C)-98.6 F (37 C)] 98 F (36.7 C) (11/11 0918) Pulse Rate:  [84-106] 86 (11/11 0918) Resp:  [16-20] 20 (11/11 0918) BP: (99-108)/(52-73) 108/73 mmHg (11/11 0918) SpO2:  [98 %-100 %] 100 % (11/11 0918) FiO2 (%):  [28 %] 28 % (11/11 0918) Weight:  [51.211 kg (112 lb 14.4 oz)] 51.211 kg (112 lb 14.4 oz) (11/11 0500)  Intake/Output from previous day: 11/10 0701 - 11/11 0700 In: -  Out: 1375 [Urine:1375] Intake/Output this shift:     Recent Labs  09/03/13 0618 09/04/13 0520  HGB 9.6* 10.1*    Recent Labs  09/03/13 0618 09/04/13 0520  WBC 20.6* 18.1*  RBC 3.08* 3.19*  HCT 29.2* 30.3*  PLT 225 218    Recent Labs  09/02/13 1430  NA 143  K 3.8  CL 103  CO2 30  BUN 19  CREATININE 0.52  GLUCOSE 122*  CALCIUM 8.6   No results found for this basename: LABPT, INR,  in the last 72 hours  Intact pulses distally No cellulitis present Compartment soft  Assessment/Plan: 19 Days Post-Op Procedure(s) (LRB): PERCUTANEOUS TRACHEOSTOMY (N/A) Up with therapy L. ARM out of splint with OT for ROM// then back in splint.  Pt safe in terms of thrashing in post splint L. Leg may be out of splint with PT/OTfor ROM but then back in splint// should be in splint all times in bed-- may be oob-chair frequently in splints.  Ilaisaane Marts L 09/05/2013, 9:25 AM

## 2013-09-05 NOTE — Procedures (Signed)
Tracheostomy Change Note  Patient Details:   Name: Cory [name] DOB: April 22, 1981 MRN: [id number]    Airway Documentation:     Evaluation  O2 sats: stable throughout Complications: No apparent complications Patient did tolerate procedure well. Bilateral Breath Sounds: Clear;Diminished Suctioning: Airway (+)Etco2 reading, b/l b.s. 100% saturations Joylene John 09/05/2013, 1:50 PM

## 2013-09-05 NOTE — Clinical Social Work Note (Signed)
Clinical Social Worker continuing to follow patient and family for support and discharge planning needs.  CSW continuing search for letter of guarantee bed offer.  CSW has initiated search to [facility] of [place], [place], and [place].  Patient may have his tracheostomy decanulated tomorrow and if so will send updated information to all original facilities to confirm denial.  Patient mother with limited transportation and will likely have issues with paperwork completion.  CSW will continue to remain available for support and to facilitate patient discharge needs once bed available.  [name], [place] [phone]
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
## 2013-09-05 NOTE — Progress Notes (Signed)
Subjective: Patient continues on trach collar and tube feedings. Continuing PT and OT.  Objective: Vital signs in last 24 hours: Filed Vitals:   09/05/13 0219 09/05/13 0401 09/05/13 0500 09/05/13 0503  BP: 104/63   102/52  Pulse: 84 106  98  Temp: 98.5 F (36.9 C)   98.6 F (37 C)  TempSrc: Axillary   Axillary  Resp: 18 16  20   Height:      Weight:   51.211 kg (112 lb 14.4 oz)   SpO2: 100% 100%  100%    Intake/Output from previous day: 11/10 0701 - 11/11 0700 In: -  Out: 1375 [Urine:1375] Intake/Output this shift:    Physical Exam:  Opening eyes spontaneously, continues to have spontaneous movement on the left side, but not following commands. No attempt speech.  CBC  Recent Labs  09/03/13 0618 09/04/13 0520  WBC 20.6* 18.1*  HGB 9.6* 10.1*  HCT 29.2* 30.3*  PLT 225 218   BMET  Recent Labs  09/02/13 1430  NA 143  K 3.8  CL 103  CO2 30  GLUCOSE 122*  BUN 19  CREATININE 0.52  CALCIUM 8.6    Assessment/Plan: Neurologically without change. Continue current supportive care.   Hewitt Shorts, MD 09/05/2013, 7:47 AM
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
## 2013-09-05 NOTE — Progress Notes (Signed)
19 Days Post-Op  Subjective: No new changes   Objective: Vital signs in last 24 hours: Temp:  [98 F (36.7 C)-98.6 F (37 C)] 98.6 F (37 C) (11/11 0503) Pulse Rate:  [84-106] 98 (11/11 0503) Resp:  [16-20] 20 (11/11 0503) BP: (99-104)/(52-69) 102/52 mmHg (11/11 0503) SpO2:  [98 %-100 %] 100 % (11/11 0503) FiO2 (%):  [28 %] 28 % (11/11 0503) Weight:  [112 lb 14.4 oz (51.211 kg)] 112 lb 14.4 oz (51.211 kg) (11/11 0500) Last BM Date: 09/01/13  Intake/Output from previous day: 11/10 0701 - 11/11 0700 In: -  Out: 1375 [Urine:1375] Intake/Output this shift:    Lungs clear Not following commands  Lab Results:   Recent Labs  09/03/13 0618 09/04/13 0520  WBC 20.6* 18.1*  HGB 9.6* 10.1*  HCT 29.2* 30.3*  PLT 225 218   BMET  Recent Labs  09/02/13 1430  NA 143  K 3.8  CL 103  CO2 30  GLUCOSE 122*  BUN 19  CREATININE 0.52  CALCIUM 8.6   PT/INR No results found for this basename: LABPROT, INR,  in the last 72 hours ABG No results found for this basename: PHART, PCO2, PO2, HCO3,  in the last 72 hours  Studies/Results: No results found.  Anti-infectives: Anti-infectives   Start     Dose/Rate Route Frequency Ordered Stop   08/24/13 1600  ciprofloxacin (CIPRO) IVPB 400 mg     400 mg 200 mL/hr over 60 Minutes Intravenous 2 times daily 08/24/13 1319 08/29/13 1200   08/22/13 1000  erythromycin (EES) 400 MG/5ML suspension 400 mg  Status:  Discontinued     400 mg Per Tube 3 times per day 08/22/13 0757 08/29/13 0747   08/22/13 0000  vancomycin (VANCOCIN) IVPB 1000 mg/200 mL premix  Status:  Discontinued     1,000 mg 200 mL/hr over 60 Minutes Intravenous 4 times per day 08/21/13 1834 08/23/13 1042   08/20/13 1000  vancomycin (VANCOCIN) IVPB 750 mg/150 ml premix  Status:  Discontinued     750 mg 150 mL/hr over 60 Minutes Intravenous Every 8 hours 08/20/13 0915 08/21/13 1834   08/17/13 1100  piperacillin-tazobactam (ZOSYN) IVPB 3.375 g  Status:  Discontinued     3.375 g 12.5 mL/hr over 240 Minutes Intravenous Every 8 hours 08/17/13 0938 08/24/13 1319   08/17/13 1000  vancomycin (VANCOCIN) IVPB 750 mg/150 ml premix  Status:  Discontinued     750 mg 150 mL/hr over 60 Minutes Intravenous Every 8 hours 08/17/13 0936 08/18/13 1014   08/13/13 2200  ceFAZolin (ANCEF) IVPB 2 g/50 mL premix     2 g 100 mL/hr over 30 Minutes Intravenous 3 times per day 08/13/13 1930 08/15/13 1522   08/12/13 0600  ceFAZolin (ANCEF) IVPB 2 g/50 mL premix  Status:  Discontinued     2 g 100 mL/hr over 30 Minutes Intravenous 3 times per day 08/12/13 0047 08/13/13 1930   08/11/13 2245  ceFAZolin (ANCEF) IVPB 2 g/50 mL premix     2 g 100 mL/hr over 30 Minutes Intravenous  Once 08/11/13 2239 08/11/13 2323      Assessment/Plan: s/p Procedure(s): PERCUTANEOUS TRACHEOSTOMY (N/A)  Continuing current care  LOS: 25 days    Ceanna Wareing A 09/05/2013

## 2013-09-06 DIAGNOSIS — S270XXA Traumatic pneumothorax, initial encounter: Secondary | ICD-10-CM

## 2013-09-06 DIAGNOSIS — S2249XA Multiple fractures of ribs, unspecified side, initial encounter for closed fracture: Secondary | ICD-10-CM

## 2013-09-06 DIAGNOSIS — D62 Acute posthemorrhagic anemia: Secondary | ICD-10-CM

## 2013-09-06 MED ORDER — ENOXAPARIN SODIUM 40 MG/0.4ML ~~LOC~~ SOLN: 40.0000 mg | SUBCUTANEOUS | Status: DC
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
## 2013-09-06 NOTE — Progress Notes (Signed)
Subjective: Patient continues on trach collar and tube feedings.  Objective: Vital signs in last 24 hours: Filed Vitals:   09/06/13 0524 09/06/13 0757 09/06/13 0904 09/06/13 1048  BP: 110/67   93/71  Pulse: 92  88 89  Temp: 97.9 F (36.6 C)   98.4 F (36.9 C)  TempSrc: Axillary   Axillary  Resp: 18  18 18   Height:      Weight: 49.8 kg (109 lb 12.6 oz) 52.6 kg (115 lb 15.4 oz)    SpO2: 100%  100% 100%    Intake/Output from previous day: 11/11 0701 - 11/12 0700 In: 90 [NG/GT:90] Out: 2100 [Urine:2100] Intake/Output this shift: Total I/O In: -  Out: 200 [Urine:200]  Physical Exam:  Opening eyes spontaneously.  Moving left side well. NFC.  No attempts at speech.  CBC  Recent Labs  09/04/13 0520  WBC 18.1*  HGB 10.1*  HCT 30.3*  PLT 218    Assessment/Plan: Neurologically without change.  Continue supportive care.  Agree with SNF for disposition.  Do not envision a need for neurosurgical intervention and therefore no indication for further neurosurgical follow-up at this time, either in hospital or post-discharge.  Will discontinue further NS follow-up.  Hewitt Shorts, MD 09/06/2013, 1:06 PM
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
## 2013-09-06 NOTE — Progress Notes (Signed)
Intermittently F/C.  Working on placement. Patient examined and I agree with the assessment and plan  Violeta Gelinas, MD, MPH, FACS Pager: [phone]  09/06/2013 12:20 PM
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
## 2013-09-07 MED ORDER — QUETIAPINE FUMARATE 50 MG PO TABS
50.0000 mg | ORAL_TABLET | Freq: Three times a day (TID) | ORAL | Status: DC
  Administered 2013-09-07 (×3): 50 mg via ORAL
  Filled 2013-09-07 (×7): qty 1

## 2013-09-07 NOTE — Progress Notes (Signed)
Will plug trach as this has not been done. Adding seroquel for agitation. BP will likely not tolerate inderal. Patient examined and I agree with the assessment and plan  Violeta Gelinas, MD, MPH, FACS Pager: [phone]  09/07/2013 10:33 AM
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
## 2013-09-07 NOTE — Progress Notes (Signed)
Pt placed on 21% ATC trach collar, sat 100 %, HR 95, RR 20, BBC cl. Pt tolerating well.

## 2013-09-07 NOTE — Progress Notes (Signed)
Pt trach capped per MD order  and placed on room air, sat 100%. HR 93, RR 20, BBS cl. Pt tolerating well.Marland Kitchen
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
## 2013-09-07 NOTE — Progress Notes (Signed)
UR completed.  Armour Villanueva, RN BSN MHA CCM Trauma/Neuro ICU Case Manager [phone]  

## 2013-09-08 MED ORDER — QUETIAPINE FUMARATE 50 MG PO TABS
150.0000 mg | ORAL_TABLET | Freq: Three times a day (TID) | ORAL | Status: DC
  Administered 2013-09-08 – 2013-09-12 (×13): 150 mg via ORAL
  Filled 2013-09-08 (×15): qty 1
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
## 2013-09-08 NOTE — Progress Notes (Signed)
Some spontaneous movement of RLE today. Increasing Seroquel. CIR re-eval. May still end up needing SNF. Patient examined and I agree with the assessment and plan  Violeta Gelinas, MD, MPH, FACS Pager: [phone]  09/08/2013 10:58 AM

## 2013-09-08 NOTE — Progress Notes (Signed)
Patient ID: Cory [name], male   DOB: [birth year], 32 y.o.   MRN: [id number]   LOS: 28 days   Subjective: Patient continues to be agitated and diaphoretic. Was started on Seroquel 50 mg tid yesterday which only seems to help briefly. Sitter at bedside. Per sitter, patient continued to roll back forth in bed and bit the pillows for several hours throughout the night.   Objective: Vital signs in last 24 hours: Temp:  [97.8 F (36.6 C)-100.7 F (38.2 C)] 98.3 F (36.8 C) (11/14 0558) Pulse Rate:  [90-103] 98 (11/14 0558) Resp:  [2-22] 18 (11/14 0558) BP: (98-110)/(57-69) 103/67 mmHg (11/14 0558) SpO2:  [10 %-100 %] 100 % (11/14 0558) FiO2 (%):  [21 %-28 %] 21 % (11/13 1039) Last BM Date: 09/03/13   Physical Exam General appearance: diaphoretic, no distress Resp: clear to auscultation bilaterally and trach corked yesterday, respirations unlabored Cardio: regular rate and rhythm GI: normal findings: bowel sounds normal, soft, non-tender and peg tube in place Neurologic: does not follow commands. Able to move RUE and RLE   Assessment/Plan: PHBC  Severe TBI/epidural hematoma, SDH/SAH, intraparenchymal contusion- TBI team.  He is agitated, Seroquel started yesterday with minimal effect. Increase Seroquel to 150 mg TID Tibial plateau fracture, left humerus fracture-ORIF Dr. Luiz Blare 10/19  Facial Fractures; frontal sinus, bilateral orbital wall, right maxillary wall, nasal septum- per Dr. Suszanne Conners, likely non surgical  Right pneumothorax/rib fractures - trach has been plugged. Patient does not appear to be in any distress. Respirations even and unlabored. 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 Monitor CBC periodically.  FEN - GJ tube, tolerating feeds. Diarrhea resolved on Florastor  VTE - SCD's, Lovenox  Dispo --  agitation, sitter, SNF soon   09/08/2013

## 2013-09-08 NOTE — Progress Notes (Addendum)
Rehab admissions - I was asked by trauma case manager to reconsider patient for inpatient rehab.  I still feel patient is very low level and will need SNF after a potential rehab stay.  I will ask Dr. Riley Kill to review chart and look at patient for potential rehab admission.  I will follow up after I hear back from Dr. Riley Kill or Monday am.  Call me for questions.  #[phone]  Dr. Riley Kill says patient is still too low level to consider acute inpatient rehab admission.  Needs SNF level at this point.  Call me for questions.  #[phone]

## 2013-09-08 NOTE — Clinical Social Work Note (Signed)
Clinical Social Worker continuing to follow patient and family for support and discharge planning needs.  CSW spoke with [facility] [place] who requested updated clinicals and will review for possible bed offer for Monday.  Patient has been decanulated and patient mother aware. CSW spoke with patient mother over the phone to update on current SNF status.  MD aware of need to discharge sitter 24 hours prior to patient discharge date.  CSW remains available for support and to facilitate patient discharge needs once medically ready and bed available.  Cory [name], [place] [phone]
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
## 2013-09-08 NOTE — Progress Notes (Signed)
Removed trach. Placed gauzes and tape over trach site. Stoma is clean. Pt is breathing normally,

## 2013-09-09 MED ORDER — MORPHINE SULFATE 2 MG/ML IJ SOLN
0.5000 mg | INTRAMUSCULAR | Status: DC | PRN
  Administered 2013-09-10 (×2): 0.5 mg via INTRAVENOUS
  Filled 2013-09-09 (×2): qty 1

## 2013-09-09 MED ORDER — LORAZEPAM 2 MG/ML IJ SOLN
0.5000 mg | INTRAMUSCULAR | Status: DC | PRN
  Administered 2013-09-09 – 2013-09-10 (×2): 0.5 mg via INTRAVENOUS
  Filled 2013-09-09 (×2): qty 1
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
## 2013-09-10 MED ORDER — LORAZEPAM 2 MG/ML IJ SOLN
2.0000 mg | INTRAMUSCULAR | Status: DC | PRN
  Administered 2013-09-10 – 2013-09-12 (×11): 2 mg via INTRAVENOUS
  Filled 2013-09-10 (×11): qty 1

## 2013-09-10 MED ORDER — MORPHINE SULFATE 2 MG/ML IJ SOLN
2.0000 mg | INTRAMUSCULAR | Status: DC | PRN
  Administered 2013-09-10 – 2013-09-12 (×7): 2 mg via INTRAVENOUS
  Filled 2013-09-10 (×7): qty 1

## 2013-09-10 MED ORDER — LORAZEPAM 2 MG/ML IJ SOLN
INTRAMUSCULAR | Status: AC
  Filled 2013-09-10: qty 1

## 2013-09-10 NOTE — Progress Notes (Signed)
Increase PRN meds for agitation Appreciate CIR re-eval on 11/14 Will need SNF Patient examined and I agree with the assessment and plan  Violeta Gelinas, MD, MPH, FACS Pager: [phone]  09/10/2013 12:41 PM

## 2013-09-10 NOTE — Progress Notes (Signed)
Pt was very agitated. Pt was throwing his left arm and leg across the bed. Pt was hitting head on the rail and throwing himself in between the rails. Pt safety was a concern, although pt had a sitter in the room. Called doctor on call received a verbal order for 0.5mg  Ativan IV  PRN and 0.5mg  Morphine IV PRN.
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
## 2013-09-11 NOTE — Clinical Social Work Note (Signed)
Clinical Social Worker continuing to follow patient and family for support and discharge planning needs.  Patient is currently agitated in the bed with all 4 rails up and mittens on his hand.  Patient has been working with therapies and has progressed to Rancho Level IV - during trauma rounds, requested for inpatient rehab to evaluate patient again.  Patient has now been started on a diet.  No SNF bed available at this time.  CSW remains available for support and to assist with discharge planning needs once medically stable.  [name], [place] [phone]
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
## 2013-09-11 NOTE — Progress Notes (Signed)
Patient was certainly more active when I saw him, but not getting out of bed.  Still awaiting placement.  This patient has been seen and I agree with the findings and treatment plan.  Marta Lamas. Gae Bon, MD, FACS [phone] (pager) [phone] (direct pager) Trauma Surgeon

## 2013-09-11 NOTE — Progress Notes (Signed)
OT NOTE  ORTHO MD- Can the patient be progress from NWB on LT LE ? Pt now moving RT LE and therapy would like to attempt standing EOB in next session. Pt advise and place orders in order set if able to incr.  Thank you  Mateo Flow   OTR/L Pager: [phone] Office: [phone] .
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
## 2013-09-12 LAB — BASIC METABOLIC PANEL
BUN: 17 mg/dL (ref 6–23)
CO2: 24 mEq/L (ref 19–32)
Calcium: 8.3 mg/dL — ABNORMAL LOW (ref 8.4–10.5)
Chloride: 106 mEq/L (ref 96–112)
GFR calc Af Amer: >90 mL/min (ref >90)
Glucose, Bld: 122 mg/dL — ABNORMAL HIGH (ref 70–99)
Potassium: 4 mEq/L (ref 3.5–5.1)

## 2013-09-12 LAB — CBC
HCT: 29 % — ABNORMAL LOW (ref 39.0–52.0)
Hemoglobin: 9.6 g/dL — ABNORMAL LOW (ref 13.0–17.0)
MCH: 31.1 pg (ref 26.0–34.0)
MCV: 93.9 fL (ref 78.0–100.0)
Platelets: 124 10*3/uL — ABNORMAL LOW (ref 150–400)
RBC: 3.09 MIL/uL — ABNORMAL LOW (ref 4.22–5.81)
WBC: 10.4 10*3/uL (ref 4.0–10.5)

## 2013-09-12 MED ORDER — ENSURE PUDDING PO PUDG
1.0000 | Freq: Three times a day (TID) | ORAL | Status: DC
  Administered 2013-09-12 – 2013-09-13 (×3): 1 via ORAL
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
## 2013-09-12 NOTE — Progress Notes (Signed)
Noted passed for D1 diet. Emerging gradually. CIR  Plans re-eval. If he cannot participate enough to go to CIR, will need SNF. Patient examined and I agree with the assessment and plan  Violeta Gelinas, MD, MPH, FACS Pager: [phone]  09/12/2013 1:06 PM

## 2013-09-12 NOTE — Progress Notes (Signed)
UR completed.  Jaeleen Inzunza, RN BSN MHA CCM Trauma/Neuro ICU Case Manager [phone]  
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
## 2013-09-13 ENCOUNTER — Inpatient Hospital Stay (HOSPITAL_COMMUNITY)
Admission: RE | Admit: 2013-09-13 | Discharge: 2013-11-08 | DRG: 945 | Disposition: A | Discharge status: Skilled Nursing Facility | Payer: Medicaid Other | Source: Intra-hospital | Attending: Physical Medicine & Rehabilitation | Admitting: Physical Medicine & Rehabilitation

## 2013-09-13 DIAGNOSIS — S069X9A Unspecified intracranial injury with loss of consciousness of unspecified duration, initial encounter: Secondary | ICD-10-CM

## 2013-09-13 DIAGNOSIS — S42412K Displaced simple supracondylar fracture without intercondylar fracture of left humerus, subsequent encounter for fracture with nonunion: Secondary | ICD-10-CM | POA: Diagnosis present

## 2013-09-13 DIAGNOSIS — F17200 Nicotine dependence, unspecified, uncomplicated: Secondary | ICD-10-CM | POA: Diagnosis present

## 2013-09-13 DIAGNOSIS — S2241XA Multiple fractures of ribs, right side, initial encounter for closed fracture: Secondary | ICD-10-CM

## 2013-09-13 DIAGNOSIS — S065XAA Traumatic subdural hemorrhage with loss of consciousness status unknown, initial encounter: Secondary | ICD-10-CM | POA: Diagnosis present

## 2013-09-13 DIAGNOSIS — I609 Nontraumatic subarachnoid hemorrhage, unspecified: Secondary | ICD-10-CM | POA: Diagnosis present

## 2013-09-13 DIAGNOSIS — D62 Acute posthemorrhagic anemia: Secondary | ICD-10-CM | POA: Diagnosis present

## 2013-09-13 DIAGNOSIS — S069XAA Unspecified intracranial injury with loss of consciousness status unknown, initial encounter: Secondary | ICD-10-CM

## 2013-09-13 DIAGNOSIS — I82611 Acute embolism and thrombosis of superficial veins of right upper extremity: Secondary | ICD-10-CM | POA: Diagnosis present

## 2013-09-13 DIAGNOSIS — S270XXA Traumatic pneumothorax, initial encounter: Secondary | ICD-10-CM | POA: Diagnosis present

## 2013-09-13 DIAGNOSIS — S069X0S Unspecified intracranial injury without loss of consciousness, sequela: Secondary | ICD-10-CM

## 2013-09-13 DIAGNOSIS — I7774 Dissection of vertebral artery: Secondary | ICD-10-CM | POA: Diagnosis present

## 2013-09-13 DIAGNOSIS — S0292XS Unspecified fracture of facial bones, sequela: Secondary | ICD-10-CM

## 2013-09-13 DIAGNOSIS — Z931 Gastrostomy status: Secondary | ICD-10-CM

## 2013-09-13 DIAGNOSIS — S0280XA Fracture of other specified skull and facial bones, unspecified side, initial encounter for closed fracture: Secondary | ICD-10-CM | POA: Diagnosis present

## 2013-09-13 DIAGNOSIS — R1313 Dysphagia, pharyngeal phase: Secondary | ICD-10-CM | POA: Diagnosis present

## 2013-09-13 DIAGNOSIS — S2241XD Multiple fractures of ribs, right side, subsequent encounter for fracture with routine healing: Secondary | ICD-10-CM

## 2013-09-13 DIAGNOSIS — S82109A Unspecified fracture of upper end of unspecified tibia, initial encounter for closed fracture: Secondary | ICD-10-CM | POA: Diagnosis present

## 2013-09-13 DIAGNOSIS — S069X0D Unspecified intracranial injury without loss of consciousness, subsequent encounter: Secondary | ICD-10-CM

## 2013-09-13 DIAGNOSIS — Z5189 Encounter for other specified aftercare: Principal | ICD-10-CM

## 2013-09-13 DIAGNOSIS — Z9889 Other specified postprocedural states: Secondary | ICD-10-CM

## 2013-09-13 DIAGNOSIS — S82143A Displaced bicondylar fracture of unspecified tibia, initial encounter for closed fracture: Secondary | ICD-10-CM

## 2013-09-13 DIAGNOSIS — S42309A Unspecified fracture of shaft of humerus, unspecified arm, initial encounter for closed fracture: Secondary | ICD-10-CM | POA: Diagnosis present

## 2013-09-13 DIAGNOSIS — S020XXA Fracture of vault of skull, initial encounter for closed fracture: Secondary | ICD-10-CM | POA: Diagnosis present

## 2013-09-13 DIAGNOSIS — A4902 Methicillin resistant Staphylococcus aureus infection, unspecified site: Secondary | ICD-10-CM | POA: Diagnosis present

## 2013-09-13 DIAGNOSIS — S2241XS Multiple fractures of ribs, right side, sequela: Secondary | ICD-10-CM

## 2013-09-13 DIAGNOSIS — IMO0002 Reserved for concepts with insufficient information to code with codable children: Secondary | ICD-10-CM

## 2013-09-13 DIAGNOSIS — S065X9A Traumatic subdural hemorrhage with loss of consciousness of unspecified duration, initial encounter: Secondary | ICD-10-CM

## 2013-09-13 DIAGNOSIS — I82619 Acute embolism and thrombosis of superficial veins of unspecified upper extremity: Secondary | ICD-10-CM | POA: Diagnosis present

## 2013-09-13 MED ORDER — BISACODYL 10 MG RE SUPP: 10.0000 mg | Freq: Every day | RECTAL | Status: DC | PRN

## 2013-09-13 MED ORDER — BIOTENE DRY MOUTH MT LIQD
15.0000 mL | Freq: Four times a day (QID) | OROMUCOSAL | Status: DC
  Administered 2013-09-15 – 2013-11-08 (×107): 15 mL via OROMUCOSAL

## 2013-09-13 MED ORDER — LORAZEPAM 0.5 MG PO TABS
0.5000 mg | ORAL_TABLET | Freq: Four times a day (QID) | ORAL | Status: DC | PRN
  Administered 2013-09-19 – 2013-10-19 (×14): 0.5 mg via ORAL
  Filled 2013-09-13 (×14): qty 1

## 2013-09-13 MED ORDER — INSULIN ASPART 100 UNIT/ML ~~LOC~~ SOLN
0.0000 [IU] | Freq: Three times a day (TID) | SUBCUTANEOUS | Status: DC
  Administered 2013-09-14: 1 [IU] via SUBCUTANEOUS

## 2013-09-13 MED ORDER — ENOXAPARIN SODIUM 40 MG/0.4ML ~~LOC~~ SOLN
40.0000 mg | SUBCUTANEOUS | Status: DC
  Administered 2013-09-14: 40 mg via SUBCUTANEOUS
  Filled 2013-09-13 (×2): qty 0.4

## 2013-09-13 MED ORDER — FLEET ENEMA 7-19 GM/118ML RE ENEM: 1.0000 | ENEMA | Freq: Once | RECTAL | Status: AC | PRN

## 2013-09-13 MED ORDER — ONDANSETRON HCL 4 MG PO TABS: 4.0000 mg | ORAL_TABLET | Freq: Four times a day (QID) | ORAL | Status: DC | PRN

## 2013-09-13 MED ORDER — VITAL AF 1.2 CAL PO LIQD
300.0000 mL | Freq: Four times a day (QID) | ORAL | Status: DC
  Administered 2013-09-14: 150 mL
  Administered 2013-09-14: 100 mL
  Filled 2013-09-13 (×9): qty 474

## 2013-09-13 MED ORDER — TRAZODONE HCL 50 MG PO TABS
50.0000 mg | ORAL_TABLET | Freq: Every day | ORAL | Status: DC
  Administered 2013-09-13 – 2013-10-07 (×25): 50 mg via ORAL
  Filled 2013-09-13 (×25): qty 1

## 2013-09-13 MED ORDER — VITAL AF 1.2 CAL PO LIQD
1000.0000 mL | ORAL | Status: DC
  Filled 2013-09-13 (×2): qty 1000

## 2013-09-13 MED ORDER — ALUM & MAG HYDROXIDE-SIMETH 200-200-20 MG/5ML PO SUSP
30.0000 mL | ORAL | Status: DC | PRN
  Administered 2013-10-12 (×2): 30 mL via ORAL
  Filled 2013-09-13 (×3): qty 30

## 2013-09-13 MED ORDER — ACETAMINOPHEN 325 MG PO TABS
325.0000 mg | ORAL_TABLET | ORAL | Status: DC | PRN
  Administered 2013-09-15 – 2013-10-28 (×3): 650 mg via ORAL
  Filled 2013-09-13 (×2): qty 2

## 2013-09-13 MED ORDER — ENSURE PUDDING PO PUDG
1.0000 | Freq: Three times a day (TID) | ORAL | Status: DC
  Administered 2013-09-14 – 2013-11-08 (×104): 1 via ORAL

## 2013-09-13 MED ORDER — LORAZEPAM 2 MG/ML IJ SOLN: 0.5000 mg | Freq: Four times a day (QID) | INTRAMUSCULAR | Status: DC | PRN

## 2013-09-13 MED ORDER — GUAIFENESIN-DM 100-10 MG/5ML PO SYRP: 5.0000 mL | ORAL_SOLUTION | Freq: Four times a day (QID) | ORAL | Status: DC | PRN

## 2013-09-13 MED ORDER — QUETIAPINE FUMARATE 100 MG PO TABS
100.0000 mg | ORAL_TABLET | Freq: Two times a day (BID) | ORAL | Status: DC
  Administered 2013-09-13 – 2013-09-14 (×3): 100 mg via ORAL
  Filled 2013-09-13 (×7): qty 1

## 2013-09-13 MED ORDER — OXYCODONE HCL 5 MG PO TABS
5.0000 mg | ORAL_TABLET | ORAL | Status: DC | PRN
  Administered 2013-09-18 – 2013-09-19 (×3): 5 mg via ORAL
  Administered 2013-09-19: 10 mg via ORAL
  Administered 2013-09-19: 5 mg via ORAL
  Administered 2013-09-21 – 2013-09-23 (×4): 10 mg via ORAL
  Administered 2013-09-23 – 2013-09-25 (×3): 5 mg via ORAL
  Administered 2013-09-26 – 2013-09-29 (×4): 10 mg via ORAL
  Administered 2013-09-29: 5 mg via ORAL
  Administered 2013-09-30 – 2013-10-06 (×3): 10 mg via ORAL
  Administered 2013-10-07: 5 mg via ORAL
  Administered 2013-10-11: 10 mg via ORAL
  Administered 2013-10-11: 5 mg via ORAL
  Administered 2013-10-12: 10 mg via ORAL
  Administered 2013-10-12: 5 mg via ORAL
  Administered 2013-10-12 – 2013-10-13 (×2): 10 mg via ORAL
  Administered 2013-10-13: 5 mg via ORAL
  Administered 2013-10-13 – 2013-10-16 (×5): 10 mg via ORAL
  Administered 2013-10-17: 5 mg via ORAL
  Administered 2013-10-20: 10 mg via ORAL
  Administered 2013-10-28: 5 mg via ORAL
  Administered 2013-10-29 (×2): 10 mg via ORAL
  Administered 2013-10-30: 5 mg via ORAL
  Administered 2013-10-30 – 2013-10-31 (×2): 10 mg via ORAL
  Administered 2013-10-31 – 2013-11-01 (×3): 5 mg via ORAL
  Administered 2013-11-01 – 2013-11-02 (×2): 10 mg via ORAL
  Administered 2013-11-02: 5 mg via ORAL
  Administered 2013-11-02 – 2013-11-05 (×2): 10 mg via ORAL
  Administered 2013-11-06 – 2013-11-07 (×2): 5 mg via ORAL
  Administered 2013-11-07 (×2): 10 mg via ORAL
  Filled 2013-09-13 (×3): qty 2
  Filled 2013-09-13: qty 1
  Filled 2013-09-13 (×2): qty 2
  Filled 2013-09-13: qty 1
  Filled 2013-09-13 (×7): qty 2
  Filled 2013-09-13 (×3): qty 1
  Filled 2013-09-13: qty 2
  Filled 2013-09-13 (×2): qty 1
  Filled 2013-09-13: qty 2
  Filled 2013-09-13 (×2): qty 1
  Filled 2013-09-13 (×2): qty 2
  Filled 2013-09-13 (×4): qty 1
  Filled 2013-09-13 (×2): qty 2
  Filled 2013-09-13 (×4): qty 1
  Filled 2013-09-13 (×2): qty 2
  Filled 2013-09-13: qty 1
  Filled 2013-09-13 (×2): qty 2
  Filled 2013-09-13 (×4): qty 1
  Filled 2013-09-13 (×2): qty 2
  Filled 2013-09-13: qty 1
  Filled 2013-09-13 (×9): qty 2
  Filled 2013-09-13: qty 1

## 2013-09-13 MED ORDER — DIPHENHYDRAMINE HCL 12.5 MG/5ML PO ELIX: 12.5000 mg | ORAL_SOLUTION | Freq: Four times a day (QID) | ORAL | Status: DC | PRN

## 2013-09-13 MED ORDER — VITAL AF 1.2 CAL PO LIQD
1000.0000 mL | Freq: Four times a day (QID) | ORAL | Status: DC
  Filled 2013-09-13 (×4): qty 1000

## 2013-09-13 MED ORDER — FREE WATER
200.0000 mL | Freq: Three times a day (TID) | Status: DC
  Administered 2013-09-13 – 2013-09-14 (×2): 200 mL

## 2013-09-13 MED ORDER — CHLORHEXIDINE GLUCONATE 0.12 % MT SOLN
15.0000 mL | Freq: Two times a day (BID) | OROMUCOSAL | Status: DC
  Administered 2013-09-14 – 2013-10-17 (×51): 15 mL via OROMUCOSAL
  Filled 2013-09-13 (×70): qty 15

## 2013-09-13 MED ORDER — ONDANSETRON HCL 4 MG/2ML IJ SOLN: 4.0000 mg | Freq: Four times a day (QID) | INTRAMUSCULAR | Status: DC | PRN

## 2013-09-13 MED ORDER — METHOCARBAMOL 500 MG PO TABS
500.0000 mg | ORAL_TABLET | Freq: Four times a day (QID) | ORAL | Status: DC | PRN
  Administered 2013-11-05: 500 mg via ORAL
  Filled 2013-09-13: qty 1

## 2013-09-13 NOTE — Consult Note (Signed)
Please note WOC consult from today 09/13/13.  Orders written for wound care M. Eliberto Ivory RN,CWOCN [phone]

## 2013-09-13 NOTE — Progress Notes (Signed)
Pt mother updated on patient throughout this shift.

## 2013-09-13 NOTE — Progress Notes (Signed)
Patient moving all over the place.  Removed suture from right chest wall at old chest tube site.  Has dorsal ulcer on his left foot and heel ulcer.  Pictures below.  Will get wound care to consult.     This patient has been seen and I agree with the findings and treatment plan.  Marta Lamas. Gae Bon, MD, FACS [phone] (pager) [phone] (direct pager) Trauma Surgeon

## 2013-09-13 NOTE — Progress Notes (Signed)
Rehab admissions - I have received calls and requests from trauma team and from therapists to re-evaluate for possible admit to inpatient rehab.  I have updated Dr. Riley Kill and I have asked him to see patient again and write a note in patient chart.  I will await response from Dr. Riley Kill.  Call me for questions.  #[phone]
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
## 2013-09-13 NOTE — Progress Notes (Signed)
OT NOTE  Pt needs new KI and new prafo boots. MD Lindie Spruce provided verbal order to OT [facility] during session. Pt now pending CIR admission so orders not placed in chart. Pt with break down starting in LT KI on the interior aspect and could benefit from new KI . Pt with Lt prafo missing pieces and screw exposed.   Please address on CIR   Pt with noted break down on Lt foot with new WOC consult and break down on elbow LT UE.    Cory [name]   OTR/L Pager: [phone] Office: [phone] .

## 2013-09-13 NOTE — Progress Notes (Signed)
Pt arrived to unit at 1700. Pt originally moved to regular bed from vail bed with pt very agitated and showing many unsafe behaviors. Very restless in bed with constant movement. Notified Pam Love PA of issue and unsafe behaviors with new orders to d/c cont. Feedings and attempt bolus feedings q6hrs. We put pt back in vail bed with no cont. Feedings at this time. Vail bed enclosed, 4rails up, and pillows to pad ralings for protection from unsafe behaviors. Unable to address any admission process due to lack of orientation and alertness, no family at bedside.  Sitter d/c with frequent checks on pt. Will cont. To monitor pt.
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
## 2013-09-13 NOTE — Progress Notes (Signed)
Rehab admissions - I have spoken with Dr. Riley Kill.  We are in agreement to admit to acute inpatient rehab.  It is likely that we will need assistance from social work department at the end of rehab stay if SNF is needed at that time.  I spoke with patient's mom and she is very excited about inpatient rehab admission.  Bed available and will admit to acute inpatient rehab today.  Call me for questions.  #[phone]
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
## 2013-09-13 NOTE — Clinical Social Work Note (Addendum)
Clinical Social Worker continuing to follow patient and family for support and discharge planning needs.  CSW spoke with inpatient rehab admissions coordinator who states that patient has been accepted and will plan to transfer today.  Inpatient rehab admissions coordinator has communicated plans with patient mother who is agreeable and prepared for patient transfer today.  CSW has left a message with inpatient rehab CSW to discuss patient case and offer resources and assistance in regards to disposition as needed.  CSW has also left a message for [facility] [place] to update on patient current situation regarding patient current treatment plans.  Patient unable to complete SBIRT assessment due to mental status at this time.  Clinical Social Worker will sign off for now as social work intervention is no longer needed. Please consult [place] again if new need arises.  CSW remains available for inpatient rehab CSW as needed.  [name], [place] [phone]

## 2013-09-14 ENCOUNTER — Inpatient Hospital Stay (HOSPITAL_COMMUNITY): Payer: Medicaid Other | Admitting: *Deleted

## 2013-09-14 ENCOUNTER — Inpatient Hospital Stay (HOSPITAL_COMMUNITY): Payer: Medicaid Other | Admitting: Occupational Therapy

## 2013-09-14 ENCOUNTER — Inpatient Hospital Stay (HOSPITAL_COMMUNITY): Payer: Medicaid Other | Admitting: Speech Pathology

## 2013-09-14 DIAGNOSIS — I82619 Acute embolism and thrombosis of superficial veins of unspecified upper extremity: Secondary | ICD-10-CM | POA: Diagnosis present

## 2013-09-14 DIAGNOSIS — I82611 Acute embolism and thrombosis of superficial veins of right upper extremity: Secondary | ICD-10-CM | POA: Diagnosis present

## 2013-09-14 DIAGNOSIS — S069X9A Unspecified intracranial injury with loss of consciousness of unspecified duration, initial encounter: Secondary | ICD-10-CM

## 2013-09-14 LAB — CBC WITH DIFFERENTIAL/PLATELET
Basophils Absolute: 0 10*3/uL (ref 0.0–0.1)
Basophils Relative: 0 % (ref 0–1)
Eosinophils Relative: 5 % (ref 0–5)
HCT: 31.8 % — ABNORMAL LOW (ref 39.0–52.0)
MCHC: 33 g/dL (ref 30.0–36.0)
MCV: 94.4 fL (ref 78.0–100.0)
Monocytes Absolute: 1.4 10*3/uL — ABNORMAL HIGH (ref 0.1–1.0)
Neutro Abs: 12 10*3/uL — ABNORMAL HIGH (ref 1.7–7.7)
Platelets: 113 10*3/uL — ABNORMAL LOW (ref 150–400)
RDW: 15.6 % — ABNORMAL HIGH (ref 11.5–15.5)
WBC: 16.1 10*3/uL — ABNORMAL HIGH (ref 4.0–10.5)

## 2013-09-14 LAB — URINALYSIS, ROUTINE W REFLEX MICROSCOPIC
Glucose, UA: NEGATIVE mg/dL
Hgb urine dipstick: NEGATIVE
Ketones, ur: NEGATIVE mg/dL
Nitrite: NEGATIVE
Protein, ur: NEGATIVE mg/dL
Urobilinogen, UA: 0.2 mg/dL (ref 0.0–1.0)

## 2013-09-14 LAB — COMPREHENSIVE METABOLIC PANEL
ALT: 15 U/L (ref 0–53)
AST: 27 U/L (ref 0–37)
Albumin: 2.8 g/dL — ABNORMAL LOW (ref 3.5–5.2)
Alkaline Phosphatase: 150 U/L — ABNORMAL HIGH (ref 39–117)
Calcium: 8.5 mg/dL (ref 8.4–10.5)
Chloride: 105 mEq/L (ref 96–112)
Creatinine, Ser: 0.51 mg/dL (ref 0.50–1.35)
GFR calc Af Amer: >90 mL/min (ref >90)
Sodium: 142 mEq/L (ref 135–145)
Total Bilirubin: 0.7 mg/dL (ref 0.3–1.2)
Total Protein: 7.5 g/dL (ref 6.0–8.3)

## 2013-09-14 LAB — GLUCOSE, CAPILLARY
Glucose-Capillary: 89 mg/dL (ref 70–99)
Glucose-Capillary: 105 mg/dL — ABNORMAL HIGH (ref 70–99)

## 2013-09-14 MED ORDER — FREE WATER
160.0000 mL | Status: DC
  Administered 2013-09-14: 160 mL

## 2013-09-14 MED ORDER — PRO-STAT SUGAR FREE PO LIQD
30.0000 mL | Freq: Four times a day (QID) | ORAL | Status: DC
  Administered 2013-09-14: 30 mL
  Filled 2013-09-14 (×5): qty 30

## 2013-09-14 MED ORDER — FREE WATER
200.0000 mL | Freq: Every day | Status: DC
  Administered 2013-09-14 – 2013-10-07 (×111): 200 mL

## 2013-09-14 MED ORDER — VITAL 1.5 CAL PO LIQD
237.0000 mL | Freq: Every day | ORAL | Status: DC
  Administered 2013-09-14: 237 mL
  Administered 2013-09-14: 120 mL
  Administered 2013-09-15 – 2013-10-06 (×104): 237 mL
  Filled 2013-09-14 (×125): qty 237

## 2013-09-14 MED ORDER — VITAL 1.5 CAL PO LIQD
1000.0000 mL | ORAL | Status: DC
  Filled 2013-09-14 (×3): qty 1000

## 2013-09-14 MED ORDER — PRO-STAT SUGAR FREE PO LIQD
30.0000 mL | Freq: Two times a day (BID) | ORAL | Status: DC
  Administered 2013-09-14 – 2013-10-09 (×49): 30 mL
  Filled 2013-09-14 (×53): qty 30
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
## 2013-09-14 NOTE — Progress Notes (Signed)
Physical Therapy Note  Patient Details  Name: Willys Salvino MRN: [id number] Date of Birth: Apr 21, 1981 Today's Date: 09/14/2013  Time: 1315-1410 55 minutes  1:1 No signs/symptoms of pain.  Pt supine in veil bed, restless, rolling side to side with hip and trunk flexed.  Presented verbal stimuli, saying pt's name.  Pt turns head in response 2/10 trials.  Attempt to have pt respond to physical touch on R hand, R LE, no response noted.  Transfer to w/c with +2 total A.  Pt seated in w/c and measured for H harness seating system for safety when up in chair.  Attempt trials of eating magic cup.  Pt moved head/mouth toward spoon 10/10 trials when spoon placed in various fields of vision L to R.  Pt also moves mouth toward wash cloth when presented in various fields of vision ? Reflex vs purposeful movement.  Hand over hand R hand assist for washing face, pt at first with biting reflex, after multiple reps able to not bite at wash cloth while wiping R side of his face.   DONAWERTH,KAREN 09/14/2013, 2:10 PM
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
## 2013-09-14 NOTE — Progress Notes (Signed)
Speech Language Pathology Daily Session Note  Patient Details  Name: Cory [name] MRN: [id number] Date of Birth: December 28, 1980  Today's Date: 09/14/2013 Time: 1430-1500 Time Calculation (min): 30 min  Short Term Goals: Week 1: SLP Short Term Goal 1 (Week 1): Pt will localize response with auditory stimuli with 50% of opportunities with max A multimodal cueing.  SLP Short Term Goal 2 (Week 1): Pt will focus attention to a functioanl task for 5 seconds with Max A multimodal cueing  SLP Short Term Goal 3 (Week 1): Pt will visually track a functional item with 50% of opportunities with Max A multimodal cueing   Skilled Therapeutic Interventions: Treatment focus on dysphagia and cognitive goals. SLP facilitated session by providing auditory stimulation with name, pt localized response with 1 of 10 trials when tactile stimulation was incorporated. Pt also consumed 6 trials of honey-thick liquids via tsp without overt s/s of aspiration but was total A for self-feeding without a localized response to PO trials. Pt with increased restlessness with intermittent closing of eyes and pt transferred back to vail bed +2.    FIM:  Comprehension Comprehension Mode: Auditory Comprehension: 1-Understands basic less than 25% of the time/requires cueing 75% of the time Expression Expression Mode: Nonverbal Expression: 1-Expresses basis less than 25% of the time/requires cueing greater than 75% of the time. Social Interaction Social Interaction: 1-Interacts appropriately less than 25% of the time. May be withdrawn or combative. Problem Solving Problem Solving: 1-Solves basic less than 25% of the time - needs direction nearly all the time or does not effectively solve problems and may need a restraint for safety Memory Memory: 1-Recognizes or recalls less than 25% of the time/requires cueing greater than 75% of the time FIM - Eating Eating Activity: 1: Helper feeds patient  Pain No s/s of  pain  Therapy/Group: Individual Therapy  Trezure Cronk 09/14/2013, 4:00 PM
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
## 2013-09-14 NOTE — Progress Notes (Signed)
Patient information reviewed and entered into [facility] system by Olamide Lahaie, RN, CRRN, PPS Coordinator.  Information including medical coding and functional independence measure will be reviewed and updated through discharge.    

## 2013-09-15 ENCOUNTER — Inpatient Hospital Stay (HOSPITAL_COMMUNITY): Payer: Medicaid Other | Admitting: Occupational Therapy

## 2013-09-15 ENCOUNTER — Inpatient Hospital Stay (HOSPITAL_COMMUNITY): Payer: Medicaid Other | Admitting: Speech Pathology

## 2013-09-15 ENCOUNTER — Inpatient Hospital Stay (HOSPITAL_COMMUNITY): Payer: Medicaid Other | Admitting: *Deleted

## 2013-09-15 ENCOUNTER — Inpatient Hospital Stay (HOSPITAL_COMMUNITY): Payer: Medicaid Other

## 2013-09-15 ENCOUNTER — Inpatient Hospital Stay (HOSPITAL_COMMUNITY): Payer: Self-pay | Admitting: Occupational Therapy

## 2013-09-15 LAB — CBC
HCT: 30.9 % — ABNORMAL LOW (ref 39.0–52.0)
Hemoglobin: 10.4 g/dL — ABNORMAL LOW (ref 13.0–17.0)
MCHC: 33.7 g/dL (ref 30.0–36.0)
Platelets: 131 10*3/uL — ABNORMAL LOW (ref 150–400)
RDW: 15.6 % — ABNORMAL HIGH (ref 11.5–15.5)
WBC: 20.4 10*3/uL — ABNORMAL HIGH (ref 4.0–10.5)

## 2013-09-15 LAB — URINE CULTURE: Culture: NO GROWTH

## 2013-09-15 LAB — GLUCOSE, CAPILLARY
Glucose-Capillary: 101 mg/dL — ABNORMAL HIGH (ref 70–99)
Glucose-Capillary: 114 mg/dL — ABNORMAL HIGH (ref 70–99)

## 2013-09-15 MED ORDER — AMANTADINE HCL 50 MG/5ML PO SYRP
100.0000 mg | ORAL_SOLUTION | Freq: Two times a day (BID) | ORAL | Status: DC
  Administered 2013-09-15 – 2013-09-29 (×29): 100 mg via ORAL
  Filled 2013-09-15 (×31): qty 10

## 2013-09-15 MED ORDER — HYDROCERIN EX CREA
TOPICAL_CREAM | Freq: Two times a day (BID) | CUTANEOUS | Status: DC
  Administered 2013-09-15 (×2): via TOPICAL
  Administered 2013-09-16: 1 via TOPICAL
  Administered 2013-09-16 – 2013-09-17 (×2): via TOPICAL
  Administered 2013-09-17: 1 via TOPICAL
  Administered 2013-09-18 – 2013-10-31 (×86): via TOPICAL
  Administered 2013-10-31: 1 via TOPICAL
  Administered 2013-11-01 – 2013-11-05 (×9): via TOPICAL
  Administered 2013-11-05: 1 via TOPICAL
  Administered 2013-11-06 – 2013-11-08 (×5): via TOPICAL
  Filled 2013-09-15 (×2): qty 113

## 2013-09-15 MED ORDER — ENOXAPARIN SODIUM 30 MG/0.3ML ~~LOC~~ SOLN
30.0000 mg | SUBCUTANEOUS | Status: DC
  Administered 2013-09-15 – 2013-11-07 (×52): 30 mg via SUBCUTANEOUS
  Filled 2013-09-15 (×55): qty 0.3

## 2013-09-15 MED ORDER — CEPHALEXIN 250 MG/5ML PO SUSR
500.0000 mg | Freq: Three times a day (TID) | ORAL | Status: DC
  Administered 2013-09-15 – 2013-09-16 (×4): 500 mg via ORAL
  Filled 2013-09-15 (×7): qty 10

## 2013-09-15 MED ORDER — QUETIAPINE FUMARATE 50 MG PO TABS
50.0000 mg | ORAL_TABLET | Freq: Two times a day (BID) | ORAL | Status: DC
  Administered 2013-09-15 – 2013-09-29 (×28): 50 mg via ORAL
  Filled 2013-09-15 (×32): qty 1
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
## 2013-09-15 NOTE — Progress Notes (Signed)
Subjective/Complaints:  In enclosure bed. Awakened after 3 verbal cues. I said "hello" to him and he looked at me briefly and said "hey" Afebrile.  A  review of systems has been performed and if not noted above is otherwise negative. .  Objective: Vital Signs: Blood pressure 113/76, pulse 115, temperature 97.1 F (36.2 C), temperature source Oral, resp. rate 20, weight 38.148 kg (84 lb 1.6 oz), SpO2 98.00%. No results found.  Recent Labs  09/14/13 0545 09/15/13 0550  WBC 16.1* 20.4*  HGB 10.5* 10.4*  HCT 31.8* 30.9*  PLT 113* 131*    Recent Labs  09/12/13 1115 09/14/13 0545  NA 142 142  K 4.0 3.7  CL 106 105  GLUCOSE 122* 84  BUN 17 15  CREATININE 0.56 0.51  CALCIUM 8.3* 8.5   CBG (last 3)   Recent Labs  09/14/13 1823 09/15/13 0004 09/15/13 0525  GLUCAP 123* 114* 101*    Wt Readings from Last 3 Encounters:  09/13/13 38.148 kg (84 lb 1.6 oz)  09/13/13 46.811 kg (103 lb 3.2 oz)  09/13/13 46.811 kg (103 lb 3.2 oz)    Physical Exam:  Constitutional:    Restless, lethargic Eyes: Right eye exhibits no discharge. Left eye exhibits no discharge.  Neck:  Janina Mayo out  Cardiovascular: Normal rate and regular rhythm.  Respiratory: Effort normal.  GI: Soft.  Lymphadenopathy:  He has no cervical adenopathy.  Neurological:  Left gaze preference. Moves eyes when cued but doesn't make eye contact. Continues to reach and grab at objects on the left. Frequently bikes. Occasionally follows a simple command but very inconsistent. see notes above Musc:  Left arm remains in splint, left leg in splint. G/J tube in place.  Skin: left heel/foot ulcers and wounds present with some odor,    Assessment/Plan: 1. 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 Physiatrist is providing close team supervision and 24 hour management of active medical problems listed  below. Physiatrist and rehab team continue to assess barriers to discharge/monitor patient progress toward functional and medical goals. FIM: FIM - Bathing Bathing: 1: Two helpers  FIM - Upper Body Dressing/Undressing Upper body dressing/undressing: 1: Two helpers FIM - Lower Body Dressing/Undressing Lower body dressing/undressing: 1: Two helpers  FIM - Interior and spatial designer: 1: Two helpers     FIM - Press photographer: 1: Two helpers  FIM - Locomotion: Printmaker: Wheelchair: 1: Total Assistance/staff pushes wheelchair (Pt<25%) FIM - Locomotion: Ambulation Locomotion: Ambulation: 0: Activity did not occur (unsafet to attempt on eval)  Comprehension Comprehension Mode: Auditory Comprehension: 1-Understands basic less than 25% of the time/requires cueing 75% of the time  Expression Expression Mode: Nonverbal Expression: 1-Expresses basis less than 25% of the time/requires cueing greater than 75% of the time.  Social Interaction Social Interaction: 1-Interacts appropriately less than 25% of the time. May be withdrawn or combative.  Problem Solving Problem Solving: 1-Solves basic less than 25% of the time - needs direction nearly all the time or does not effectively solve problems and may need a restraint for safety  Memory Memory: 1-Recognizes or recalls less than 25% of the time/requires cueing greater than 75% of the time  Medical Problem List and Plan:  1. DVT Prophylaxis/Anticoagulation: Pharmaceutical: Lovenox  2. Pain Management: Prn oxycodone for outward signs of pain.  3. Mood: Currently too low level to evaluate.  4. Neuropsych: This patient is not capable of making decisions on  his own behalf.  5. Recurrent Fever: afebrile but white count climbing  -wound culture with s aureus---begin keflex. Await culture and sens 6. Left tibial plateau Fx--s/p ORIF: NWB with no ROM---KI at all times.  7. Left humerus Fx--s/p ORIF: NWB and PROM  with OT/PT  8. Leucocytosis: see above 9. Left shin and heel wound: Continue bilateral PRAFO. Pressure relief measures--shin wound due to KI irritation?. WOC consult  .air mattress overlay.   10. ABLA: hgb 10.4 11. Dysphagia: Continue TF for supplement. Continue D1, honey liquids with assistance.  12. Agitation: seroquel wean to 50mg  bid--hold at this dose for now Continue to use ativan prn. [place] bed for safety, mitten  -add amantadine for attention/arousal  LOS (Days) 2 A FACE TO FACE EVALUATION WAS PERFORMED  Chinelo Benn T 09/15/2013 8:29 AM

## 2013-09-15 NOTE — IPOC Note (Signed)
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     Functional Problem List: Nursing Behavior;Bladder;Bowel;Endurance;Medication Management;Motor;Pain;Perception;Safety;Sensory;Skin Integrity  PT Balance;Endurance;Motor;Safety;Sensory  OT [employer];Safety;Sensory;Skin Integrity;Cognition;Edema;Vision;Endurance;Motor;Nutrition;Pain;Perception  SLP Cognition  TR         Basic ADL's: OT Eating;Grooming;Bathing;Dressing;Toileting     Advanced  ADL's: OT       Transfers: PT Bed Mobility;Bed to Chair;Car;Furniture  OT Toilet;Tub/Shower     Locomotion: PT Ambulation;Wheelchair Mobility;Stairs     Additional Impairments: OT Fuctional Use of Upper Extremity  SLP Swallowing;Communication;Social Cognition comprehension;expression Social Interaction;Problem Solving;Memory;Attention;Awareness  TR      Anticipated Outcomes Item Anticipated Outcome  Self Feeding mod A   Swallowing  Max A with least restrictive diet    Basic self-care  max A   Toileting  total A +2    Bathroom Transfers Total A +2 (pt 50%)  Bowel/Bladder  Max assist  Transfers  +2 assist  Locomotion  total A  Communication  Total A  Cognition  Total A  Pain  Patient will verbalize pain level and medicate as needed  Safety/Judgment  Max Assist   Therapy Plan: PT Intensity: Minimum of 1-2 x/day ,45 to 90 minutes PT Frequency: 5 out of 7 days PT Duration Estimated Length of Stay: 2 week trial   OT Intensity: Minimum of 1-2 x/day, 45 to 90 minutes OT Frequency: 5 out of 7 days OT Duration/Estimated Length of Stay: trial of 2 weeks SLP Intensity: Minumum of 1-2 x/day, 30 to 90 minutes SLP Frequency: 5 out of 7 days SLP Duration/Estimated Length of Stay: 2 week trial        Team Interventions: Nursing Interventions Patient/Family Education;Bladder Management;Bowel Management;Disease Management/Prevention;Pain Management;Medication Management;Skin Care/Wound Management;Cognitive Remediation/Compensation;Dysphagia/Aspiration Precaution Training;Psychosocial Support  PT interventions Ambulation/gait training;Discharge planning;Functional mobility training;Therapeutic Activities;Wheelchair propulsion/positioning;Therapeutic Exercise;Neuromuscular re-education;Balance/vestibular training;UE/LE Strength taining/ROM;Splinting/orthotics;Pain management;DME/adaptive equipment instruction;Cognitive remediation/compensation;Community reintegration;Functional electrical stimulation;Patient/family education;Stair training;UE/LE Coordination activities  OT Interventions Cognitive remediation/compensation;Balance/vestibular training;Discharge planning;Disease mangement/prevention;Pain management;Self Care/advanced ADL retraining;Therapeutic Activities;UE/LE Coordination activities;Visual/perceptual remediation/compensation;Therapeutic Exercise;Skin care/wound managment;Patient/family education;Functional mobility training;Neuromuscular re-education;Psychosocial support;UE/LE Strength taining/ROM;Wheelchair propulsion/positioning;DME/adaptive equipment instruction;Community reintegration;Splinting/orthotics  SLP Interventions Cueing hierarchy;Cognitive remediation/compensation;Environmental controls;Internal/external aids;Therapeutic Exercise;Speech/Language facilitation;Therapeutic Activities;Patient/family education;Dysphagia/aspiration precaution training;Functional tasks  TR Interventions    SW/CM  Interventions      Team Discharge Planning: Destination: PT- (TBD based on 2 week trial) ,OT- Skilled Nursing Facility (SNF) , SLP- (TBD) Projected Follow-up: PT-Skilled nursing facility;Home health PT, OT-  24 hour supervision/assistance;Skilled nursing facility, SLP-24 hour supervision/assistance (TBD) Projected Equipment Needs: PT-To be determined, OT- To be determined, SLP-None recommended by SLP Patient/family involved in discharge planning: PT- Patient unable/family or caregiver not available,  OT-Patient;Family member/caregiver, SLP-Patient unable/family or caregive not available  MD ELOS: 2 weeks Medical Rehab Prognosis:  Fair Assessment: The patient has been admitted for CIR therapies. The team will be addressing, functional mobility, strength, stamina, balance, safety, adaptive techniques/equipment, self-care, bowel and bladder mgt, patient and caregiver education, cognitive perceptual awareness, swallowing, nutrition, skin care. Goals have been set at max to total assist. The main goal of this rehab  stay is to stabilize this patient for another venue of care as it pertains to nutrition, skin care, safety, behavior, meds, etc.    Ranelle Oyster, MD, [facility]      See Team Conference Notes for weekly updates to the plan of care

## 2013-09-15 NOTE — Progress Notes (Signed)
Speech Language Pathology Daily Session Note  Patient Details  Name: Cory [name] MRN: [id number] Date of Birth: 07/28/1981  Today's Date: 09/15/2013 Time: 0800-0830 Time Calculation (min): 30 min  Short Term Goals: Week 1: SLP Short Term Goal 1 (Week 1): Pt will localize response with auditory stimuli with 50% of opportunities with max A multimodal cueing.  SLP Short Term Goal 2 (Week 1): Pt will focus attention to a functioanl task for 5 seconds with Max A multimodal cueing  SLP Short Term Goal 3 (Week 1): Pt will visually track a functional item with 50% of opportunities with Max A multimodal cueing   Skilled Therapeutic Interventions: Co-treatment with PT with focus on arousal and localized response to auditory and visual stimuli.  SLP facilitated session by providing multiple auditory stimuli (calling pt's name and music) and pt demonstrated a localized response by turning his head in 25% of opportunities and made eye contact X 2 throughout the session. Pt also demonstrate vocalizations X 4, one of which sounded like "hey."  Pt consumed trials of both puree and honey-thick liquids via tsp and demonstrated spontaneous throat clear X 4, some of which occurred before PO trials. Pt also responded to tactile and verbal cues for "head up" with 25% of opportunities.  SLP attempted oral care, however, unsuccessful due to bite reflex.  Pt did not demonstrate any tracking throughout the session. Continue current plan of care.    FIM:  Comprehension Comprehension Mode: Auditory Comprehension: 1-Understands basic less than 25% of the time/requires cueing 75% of the time Expression Expression Mode: Nonverbal Expression: 1-Expresses basis less than 25% of the time/requires cueing greater than 75% of the time. Social Interaction Social Interaction: 1-Interacts appropriately less than 25% of the time. May be withdrawn or combative. Problem Solving Problem Solving: 1-Solves basic less than 25% of the  time - needs direction nearly all the time or does not effectively solve problems and may need a restraint for safety Memory Memory: 1-Recognizes or recalls less than 25% of the time/requires cueing greater than 75% of the time FIM - Eating Eating Activity: 1: Helper feeds patient  Pain No s/s of pain  Therapy/Group: Individual Therapy  Torina Ey 09/15/2013, 9:01 AM

## 2013-09-15 NOTE — Progress Notes (Signed)
Occupational Therapy Session Note  Patient Details  Name: Cory [name] MRN: [id number] Date of Birth: July 23, 1981  Today's Date: 09/15/2013 Time: 1100-1150 Time Calculation (min): 50 min  Short Term Goals: Week 1:  OT Short Term Goal 1 (Week 1): Pt would turn head to auditory stimuli 50% of the time OT Short Term Goal 2 (Week 1): Pt would attend to name being called with max A 50% of the time OT Short Term Goal 3 (Week 1): Pt would track functional object with 50% of opportunties  OT Short Term Goal 4 (Week 1): Pt would turn head to auditory stimuli 50% of the time  Skilled Therapeutic Interventions/Progress Updates:    1:1 focus on creating opportunities to respond to different stimuli including swabs on the lips of different potency, responding to his name, clapping noise, visual response to light versus dark with flashlight and visual tracking. See below responses: swabs on the lips of different potency: lemon - response only to lick lips, no puckering                                                                 Biotene- no response other than trying to bite swab  responding to his name 1/6 response with verbal groan  clapping noise- no response to either side  visual response to light versus dark with flashlight: reactive pupils but no voluntary facial response  visual tracking: introduced magic cup looking for verbal response for another bite/ wanting more; offering in different fields. Pt would come forward to take a bite in his left field but no motor response to bite presented in right field.     Also performed floor transfer (total ) to perform rolling in safe environment with tactile cues for direction, and long sitting with min to max when pushing into extensor tone.   Therapy Documentation Precautions:  Precautions Precautions: Fall Precaution Comments: peg with abdominal binder, LUE splint, and L LE KI.   Required Braces or Orthoses: Knee Immobilizer -  Left;Sling Knee Immobilizer - Left: On at all times Other Brace/Splint: Bil PRAFOs and L UE and LE splint can be removed for ROM with Ot/PT only.   Restrictions Weight Bearing Restrictions: Yes LUE Weight Bearing: Non weight bearing LLE Weight Bearing: Non weight bearing General: General Amount of Missed OT Time (min): 10 Minutes due to fatigue    Pain:  no indications of pain   See FIM for current functional status  Therapy/Group: Individual Therapy  [name] [facility] 09/15/2013, 12:00 PM

## 2013-09-15 NOTE — Progress Notes (Signed)
Occupational Therapy Session Note  Patient Details  Name: Cory [name] MRN: [id number] Date of Birth: Apr 20, 1981  Today's Date: 09/15/2013 Time: 1400-1430 Time Calculation (min): 30 min  Short Term Goals: Week 1:  OT Short Term Goal 1 (Week 1): Pt would turn head to auditory stimuli 50% of the time OT Short Term Goal 2 (Week 1): Pt would attend to name being called with max A 50% of the time OT Short Term Goal 3 (Week 1): Pt would track functional object with 50% of opportunties  OT Short Term Goal 4 (Week 1): Pt would turn head to auditory stimuli 50% of the time  Skilled Therapeutic Interventions/Progress Updates:    Cognitive retraining with continued focus on introducing different stimuli for opportunities for purposeful responses.  Gag response: with oral care - no response  Temperature responses: applied to sensitive aspects of all 4 extremities - no localized response  Pain: pressure to nail beds on thumb of both extremities no response                                    On greater toe both extremities no response  Able to wash face with total A with a decrease in oral reflex to bite wash cloth- no initiation or response to washing his face  Pt with constant UB and LB movements throughout session with decrease purpose.  Therapy Documentation Precautions:  Precautions Precautions: Fall Precaution Comments: peg with abdominal binder, LUE splint, and L LE KI.   Required Braces or Orthoses: Knee Immobilizer - Left;Sling Knee Immobilizer - Left: On at all times Other Brace/Splint: Bil PRAFOs and L UE and LE splint can be removed for ROM with Ot/PT only.   Restrictions Weight Bearing Restrictions: Yes LUE Weight Bearing: Non weight bearing LLE Weight Bearing: Non weight bearing    Pain:  no grimaces for pain in session   See FIM for current functional status  Therapy/Group: Individual Therapy  [name] [facility] 09/15/2013, 2:59 PM

## 2013-09-15 NOTE — Progress Notes (Signed)
Physical Therapy Note  Patient Details  Name: Cory [name] MRN: [id number] Date of Birth: 09-28-1981 Today's Date: 09/15/2013  Time 1: 800-845 45 minutes co -tx with SLP  1:1 No signs/symptoms of pain.  Treatment focused on pt response to stimuli seated edge of bed.  Pt requires total A to sit edge of bed, frequent trunk flex/extension, decreased lateral trunk movement this session.  Pt responded 25% of the time to verbal and tactile cuing to raise head for eating.  Pt able to take bites of magic cup, unable to perform oral care due to biting reflex.  Supine pt turned head/body 25% of trials to music, loud voices.  Pt with more vocalizations (grunting) during this session.  Frequent throat clearing without cuing.  Pt made eye contact with therapist 2x during session.  Time 2: 1000-1115 75 minutes (30 minutes co-tx with OT)  1:1 no signs/symptoms of pain.  Pt transferred multiple attempts bed<>w/c with +2 assist, pt unable to assist with transfer.  Pt with less restlessness this session, still with trunk flex/ext requiring total A for safety.  Pt exposed to auditory and visual stimuli for localized response.  Pt turns head to auditory stimuli 25% of the time, visual stimuli 0%, no tracking of objects with eyes.  Pt responds 25% of time to tactile stimuli.  Tried olfactory stimuli, no response to bleach wipes or mouthwash.  Pt long sitting on mat with total A for trunk control, good B hip ROM.  Pt total A for changing brief, unable to actively assist.  Pt with decreased restlessness but improved localized response today.  Time 3: 1300-1400 60 minutes  1:1 no signs/symptoms of pain.  Pt found in bed incontinent of urine, able to roll toward therapists voice 25% of time to assist with hygiene.  Seated edge of bed with total A for balance pt with 0% response to olfactory stimuli, 25% response to auditory stimuli of saying name, music, tapping, clapping.  Pt with reflexive eating of a few small bites  of mashed potatoes, no verbal response or "grunting" this session.  Pt with 0% response to flashlight to eyes, pupils did react but no blinking or turning head away when light shone in eyes.  Decreased vocalizations, decreased restlessness, more trouble keeping eyes open this session.   Chyrel Taha 09/15/2013, 8:54 AM

## 2013-09-16 ENCOUNTER — Inpatient Hospital Stay (HOSPITAL_COMMUNITY): Payer: Medicaid Other | Admitting: *Deleted

## 2013-09-16 ENCOUNTER — Inpatient Hospital Stay (HOSPITAL_COMMUNITY): Payer: Medicaid Other | Admitting: Physical Therapy

## 2013-09-16 ENCOUNTER — Inpatient Hospital Stay (HOSPITAL_COMMUNITY): Payer: Medicaid - Out of State | Admitting: Occupational Therapy

## 2013-09-16 ENCOUNTER — Inpatient Hospital Stay (HOSPITAL_COMMUNITY): Payer: Medicaid Other | Admitting: Speech Pathology

## 2013-09-16 DIAGNOSIS — S069X9A Unspecified intracranial injury with loss of consciousness of unspecified duration, initial encounter: Secondary | ICD-10-CM

## 2013-09-16 DIAGNOSIS — IMO0002 Reserved for concepts with insufficient information to code with codable children: Secondary | ICD-10-CM

## 2013-09-16 LAB — CBC WITH DIFFERENTIAL/PLATELET
Basophils Relative: 0 % (ref 0–1)
Eosinophils Relative: 0 % (ref 0–5)
HCT: 29.6 % — ABNORMAL LOW (ref 39.0–52.0)
Hemoglobin: 10.1 g/dL — ABNORMAL LOW (ref 13.0–17.0)
MCHC: 34.1 g/dL (ref 30.0–36.0)
Monocytes Absolute: 2.6 10*3/uL — ABNORMAL HIGH (ref 0.1–1.0)
Monocytes Relative: 11 % (ref 3–12)
Neutro Abs: 20.3 10*3/uL — ABNORMAL HIGH (ref 1.7–7.7)
Platelets: 128 10*3/uL — ABNORMAL LOW (ref 150–400)
RBC: 3.14 MIL/uL — ABNORMAL LOW (ref 4.22–5.81)

## 2013-09-16 LAB — WOUND CULTURE

## 2013-09-16 MED ORDER — SULFAMETHOXAZOLE-TMP DS 800-160 MG PO TABS
1.0000 | ORAL_TABLET | Freq: Two times a day (BID) | ORAL | Status: DC
  Administered 2013-09-16 – 2013-09-21 (×11): 1 via ORAL
  Filled 2013-09-16 (×15): qty 1

## 2013-09-16 NOTE — Progress Notes (Signed)
Physical Therapy Note  Patient Details  Name: Daquarius Dubeau MRN: [id number] Date of Birth: 1981/01/10 Today's Date: 09/16/2013  [id number] (30 minutes) individual Pain : no acute distress noted Focus of treatment: sitting tolerance edge of bed Treatment: Pt in enclosed bed upon arrival with increased arousal noted and rolling from side to side; no eye contact with therapist noted ; attempted to sit edge of mat x 3 with total assist + second person for safety; pt required max assist to sit with intermittent trunk extension and hip extension (max assist to prevent sliding forward on bed); pt attempts to bite therapist several times ; returned to supine with bed enclosure secured.   1500-1525 (25 minutes) individual Pain: no acute distress noted Focus of treatment: Therapeutic activities focusing on passive AROM right extremities, initiation of volitional functional movement Treatment: Pt in enclosed bed upon arrival; nurse tech initiating hygiene in supine; rolling side to side with max assist (pt appeared to minimally assist with rolling); PROM right extremities with extensor tone noted in RT LE ; pt maintained eyes closed during entire session. Enclosure bed secured at end of session.    Devota Viruet,JIM 09/16/2013, 10:25 AM

## 2013-09-16 NOTE — Progress Notes (Signed)
Patient ID: Cory [name], male   DOB: [birth year], 32 y.o.   MRN: [id number]    Cory [name] is a 31 y.o. male 09-Jul-1981 [id number]  Subjective: In enclosure bed. Awakened with verbal cues. But not verbal in return. No apparent distress.  Objective: Vital signs in last 24 hours: Temp:  [97.5 F (36.4 C)-100.2 F (37.9 C)] 98.4 F (36.9 C) (11/22 0445) Pulse Rate:  [111-122] 111 (11/22 0445) Resp:  [20] 20 (11/22 0445) BP: (101-151)/(68-78) 101/68 mmHg (11/22 0445) SpO2:  [99 %] 99 % (11/22 0445) Weight:  [43.908 kg (96 lb 12.8 oz)] 43.908 kg (96 lb 12.8 oz) (11/21 1021) Weight change:  Last BM Date: 09/15/13  Intake/Output from previous day: 11/21 0701 - 11/22 0700 In: 1040 [P.O.:240; NG/GT:800] Out: 700 [Urine:700]   Physical Exam:  Constitutional: Restless, lethargic  Neck: Trach out  Cardiovascular: Normal rate and regular rhythm.  Respiratory: Effort normal.  GI: Soft.  Neurological:  Left gaze preference. Moves eyes when cued but doesn't make eye contact. Continues to reach and grab at objects on the left. Frequently bikes. Occasionally follows a simple command but very inconsistent. see notes above  Musc:  Left arm remains in splint, left leg in splint. G/J tube in place.  Skin: left heel/foot ulcers and wounds present   Lab Results: BMET    Component Value Date/Time   NA 142 09/14/2013 0545   K 3.7 09/14/2013 0545   CL 105 09/14/2013 0545   CO2 26 09/14/2013 0545   GLUCOSE 84 09/14/2013 0545   BUN 15 09/14/2013 0545   CREATININE 0.51 09/14/2013 0545   CALCIUM 8.5 09/14/2013 0545   GFRNONAA >90 09/14/2013 0545   GFRAA >90 09/14/2013 0545   CBC    Component Value Date/Time   WBC 24.3* 09/16/2013 0527   RBC 3.14* 09/16/2013 0527   HGB 10.1* 09/16/2013 0527   HCT 29.6* 09/16/2013 0527   PLT 128* 09/16/2013 0527   MCV 94.3 09/16/2013 0527   MCH 32.2 09/16/2013 0527   MCHC 34.1 09/16/2013 0527   RDW 15.5 09/16/2013 0527   LYMPHSABS 1.3 09/16/2013  0527   MONOABS 2.6* 09/16/2013 0527   EOSABS 0.1 09/16/2013 0527   BASOSABS 0.1 09/16/2013 0527   CBG's (last 3):   Recent Labs  09/14/13 1823 09/15/13 0004 09/15/13 0525  GLUCAP 123* 114* 101*   LFT's Lab Results  Component Value Date   ALT 15 09/14/2013   AST 27 09/14/2013   ALKPHOS 150* 09/14/2013   BILITOT 0.7 09/14/2013    Studies/Results: Dg Chest Port 1 View  09/15/2013   CLINICAL DATA:  Close day injury.  Leukocytosis.  EXAM: PORTABLE CHEST - 1 VIEW  COMPARISON:  09/02/2013  FINDINGS: Left base subsegmental atelectasis.  No segmental consolidation, pulmonary edema or gross pneumothorax.  Poor inspiration.  Tracheostomy tube removed.  Heart size within normal limits.  IMPRESSION: Left base subsegmental atelectasis without segmental consolidation noted.   Electronically Signed   By: Bridgett Larsson M.D.   On: 09/15/2013 09:55    Medications:  I have reviewed the patient's current medications. Scheduled Medications: . amantadine  100 mg Oral BID  . antiseptic oral rinse  15 mL Mouth Rinse QID  . cephALEXin  500 mg Oral Q8H  . chlorhexidine  15 mL Mouth Rinse BID  . enoxaparin (LOVENOX) injection  30 mg Subcutaneous Q24H  . feeding supplement (ENSURE)  1 Container Oral TID BM  . feeding supplement (PRO-STAT SUGAR FREE 64)  30 mL Per Tube  BID  . feeding supplement (VITAL 1.5 CAL)  237 mL Per Tube 5 X Daily  . free water  200 mL Per Tube 5 X Daily  . hydrocerin   Topical BID  . QUEtiapine  50 mg Oral BID  . 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  Medical Problem List and Plan:  1. DVT Prophylaxis/Anticoagulation: Pharmaceutical: Lovenox  2. Pain Management: Prn oxycodone for outward signs of pain.  3. Mood: Currently too low level to evaluate.  4. Neuropsych: This patient is not capable of making decisions on his own behalf.  5. Low grade fever: white count climbing  -wound culture with s aureus--on keflex.   6. Left tibial plateau Fx--s/p ORIF: NWB with no ROM---KI at all times.  7. Left humerus Fx--s/p ORIF: NWB and PROM with OT/PT  8. Left shin and heel wound: Continue bilateral PRAFO. Pressure relief measures--shin wound due to KI irritation?. WOC consult .air mattress overlay.  9. ABLA: stable  10. Dysphagia: Continue TF for supplement. Continue D1, honey liquids with assistance.  11. Agitation: seroquel wean to 50mg  bid--hold at this dose for now. Continue ativan prn. Vail bed for safety, mitten  -on amantadine for attention/arousal   Length of stay, days: 3  Cory A. Felicity Coyer, MD 09/16/2013, 10:08 AM
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
## 2013-09-16 NOTE — Progress Notes (Signed)
1031 Results called for wound culture. Positive for MRSA in left heel.  Patient placed on contact precautions.  Note left for MD.

## 2013-09-16 NOTE — Progress Notes (Signed)
Occupational Therapy Session Note  Patient Details  Name: Antony Sian MRN: [id number] Date of Birth: Dec 10, 1980  Today's Date: 09/16/2013 Time: 4098-1191 Time Calculation (min): 58 min  Short Term Goals: Week 1:  OT Short Term Goal 1 (Week 1): Pt would turn head to auditory stimuli 50% of the time OT Short Term Goal 2 (Week 1): Pt would attend to name being called with max A 50% of the time OT Short Term Goal 3 (Week 1): Pt would track functional object with 50% of opportunties  OT Short Term Goal 4 (Week 1): Pt would turn head to auditory stimuli 50% of the time  Skilled Therapeutic Interventions/Progress Updates:    Pt seen for OT treatment.  Required total assist +2 to transfer from supine to sit EOB (pt 0%) during transition.  In sitting pt with frequent thrusting of hips into extension as well as pushing back with his trunk and at times forward also.  Needed total assist for sitting balance EOB.  Not able to respond with protective eye blink to stimuli or turn head to vocal or visual stimuli when presented on either side.  No generalized or localized response to pinch today either.  Hand over hand total assist to wash his face.  Pt would move his head around as therapist helped to guide the washcloth.  Any time stimuli was presented close to his mouth he would open it and attempt to bite it.  Used magic cup to initiate response to visual stimulus at midline.  Pt would lunge forward with his mouth open to take a bite if spoon was presented in his midline visual field. Transferred to tilt in space chair for brief ride around the unit with total assist +2 (pt 0%).  Pt very restless in chair continuing to lunge his trunk in all directions and attempt to scoot his hips out of the chair.  Pt did on 4 occasions take pillow placed in his lap for arm positioning and throw it into the floor.  Returned to bed at end of session.  Vail bed zipped up and PRAFOs positioned back on his feet to protect his  heels.  Discussed pt's restlessness with nursing as therapist is not familiar with pt and was attempting to determine if this was normal writhing around in bed and chair or if he may be in significant pain.    Therapy Documentation Precautions:  Precautions Precautions: Fall Precaution Comments: peg with abdominal binder, LUE splint, and L LE KI.   Required Braces or Orthoses: Knee Immobilizer - Left;Sling Knee Immobilizer - Left: On at all times Other Brace/Splint: Bil PRAFOs and L UE and LE splint can be removed for ROM with Ot/PT only.   Restrictions Weight Bearing Restrictions: Yes LUE Weight Bearing: Non weight bearing LLE Weight Bearing: Non weight bearing  Pain: PAINAD (Pain Assessment in Advanced Dementia) Breathing: normal Negative Vocalization: none Facial Expression: smiling or inexpressive Body Language: rigid, fists clenched, knees up, pushing/pulling away, strikes out Consolability: unable to console, distract or reassure PAINAD Score: 4 ADL: See FIM for current functional status  Therapy/Group: Individual Therapy  Brae Gartman OTR/L 09/16/2013, 3:40 PM

## 2013-09-16 NOTE — Progress Notes (Signed)
[facility] Individual Statement of Services  Patient Name:  Cory [name]  Date:  09/16/2013  Welcome to the [facility].  Our goal is to provide you with an individualized program based on your diagnosis and situation, designed to meet your specific needs.  With this comprehensive rehabilitation program, you will be expected to participate in at least 3 hours of rehabilitation therapies Monday-Friday, with modified therapy programming on the weekends.  Your rehabilitation program will include the following services:  Physical Therapy (PT), Occupational Therapy (OT), Speech Therapy (ST), 24 hour per day rehabilitation nursing, Therapeutic Recreaction (TR), Neuropsychology, Case Management (Social Worker), Rehabilitation Medicine, Nutrition Services and Pharmacy Services  Weekly team conferences will be held on Tuesdays to discuss your progress.  Your Social Worker will talk with you frequently to get your input and to update you on team discussions.  Team conferences with you and your family in attendance may also be held.  Expected length of stay: 2 week trial  Overall anticipated outcome: TBD  Depending on your progress and recovery, your program may change. Your Social Worker will coordinate services and will keep you informed of any changes. Your Social Worker's name and contact numbers are listed  below.  The following services may also be recommended but are not provided by the [facility]:   Driving Evaluations  Home Health Rehabiltiation Services  Outpatient Rehabilitation Services  Vocational Rehabilitation  Skilled nursing facility   Arrangements will be made to provide these services after discharge if needed.  Arrangements include referral to agencies that provide these services.  Your insurance has been verified to be:  Medicaid application pending Your primary doctor is:  None  Pertinent information will be  shared with your doctor and your insurance company.  Social Worker:  [place], [place] [phone] or [phone]   Information discussed with and copy given to patient by: Amada Jupiter, 09/16/2013, 4:15 PM

## 2013-09-16 NOTE — Progress Notes (Signed)
Speech Language Pathology Daily Session Note  Patient Details  Name: Cory [name] MRN: [id number] Date of Birth: [birth year]  Today's Date: 09/16/2013 Time: 4098-1191 Time Calculation (min): 45 min  Short Term Goals: Week 1: SLP Short Term Goal 1 (Week 1): Pt will localize response with auditory stimuli with 50% of opportunities with max A multimodal cueing.  SLP Short Term Goal 2 (Week 1): Pt will focus attention to a functioanl task for 5 seconds with Max A multimodal cueing  SLP Short Term Goal 3 (Week 1): Pt will visually track a functional item with 50% of opportunities with Max A multimodal cueing   Skilled Therapeutic Interventions: Skilled intervention provided with focus on dysphagia and cognitive goals. Slp repositioned pt in bed to most tolerable upright position. Slp provided feeding assistance with puree and HTL. Bite reflux noted only with initial spoonful of puree. Pt consumed 50% of meal (breakfast). Pt noted with mild oral stasis throughout cavity, with puree consistency, cleared with liquid wash. Pt noted with throat clear x2, following completion of meal. Pt noted with increased response to auditory and visual stimuli. Pt opened mouth to accept 100% of boluses, once spoon was presented 6 in-1 ft in front of patient. Pt responded appropriately by turning to follow tactile cueing with spoon on labial area. Pt tended to turn and slide down in bed multiple times during feeding. When slp assisted pt with repositioning and told pt to sit up, pt responded x4 with vocalizations of, "ugh".    FIM:  Comprehension Comprehension Mode: Auditory Comprehension: 1-Understands basic less than 25% of the time/requires cueing 75% of the time Expression Expression Mode: Verbal Expression: 1-Expresses basis less than 25% of the time/requires cueing greater than 75% of the time. Social Interaction Social Interaction: 1-Interacts appropriately less than 25% of the time. May be withdrawn or  combative. Problem Solving Problem Solving: 1-Solves basic less than 25% of the time - needs direction nearly all the time or does not effectively solve problems and may need a restraint for safety Memory Memory: 1-Recognizes or recalls less than 25% of the time/requires cueing greater than 75% of the time FIM - Eating Eating Activity: 1: Helper feeds patient  Pain Pain Assessment Pain Assessment: No/denies pain  Therapy/Group: Individual Therapy  Ra Pfiester, Kara Pacer 09/16/2013, 11:30 AM

## 2013-09-17 ENCOUNTER — Inpatient Hospital Stay (HOSPITAL_COMMUNITY): Payer: Medicaid Other | Admitting: Occupational Therapy

## 2013-09-17 NOTE — Progress Notes (Signed)
Patient ID: Cory [name], male   DOB: May 29, 1981, 32 y.o.   MRN: [id number]    Cory [name] is a 32 y.o. male 1980-12-05 [id number]  Subjective: In enclosure bed. Awakened with verbal cues. But not verbal in return. No apparent distress.  Objective: Vital signs in last 24 hours: Temp:  [98.2 F (36.8 C)] 98.2 F (36.8 C) (11/23 0635) Pulse Rate:  [115-118] 115 (11/23 0635) Resp:  [20] 20 (11/23 0635) BP: (105-114)/(70-78) 105/70 mmHg (11/23 0635) SpO2:  [99 %-100 %] 100 % (11/23 0635) Weight change:  Last BM Date: 09/16/13  Intake/Output from previous day: 11/22 0701 - 11/23 0700 In: 800 [NG/GT:800] Out: -    Physical Exam:  Constitutional: Restless, lethargic  Neck: Trach out  Cardiovascular: Normal rate and regular rhythm.  Respiratory: Effort normal.  GI: Soft.  Neurological:  Left gaze preference. Moves eyes when cued but doesn't make eye contact. Continues to reach and grab at objects on the left. Frequently bikes. Occasionally follows a simple command but very inconsistent. see notes above  Musc:  Left arm remains in splint, left leg in splint. G/J tube in place.  Skin: left heel/foot ulcers and wounds present   Lab Results: BMET    Component Value Date/Time   NA 142 09/14/2013 0545   K 3.7 09/14/2013 0545   CL 105 09/14/2013 0545   CO2 26 09/14/2013 0545   GLUCOSE 84 09/14/2013 0545   BUN 15 09/14/2013 0545   CREATININE 0.51 09/14/2013 0545   CALCIUM 8.5 09/14/2013 0545   GFRNONAA >90 09/14/2013 0545   GFRAA >90 09/14/2013 0545   CBC    Component Value Date/Time   WBC 24.3* 09/16/2013 0527   RBC 3.14* 09/16/2013 0527   HGB 10.1* 09/16/2013 0527   HCT 29.6* 09/16/2013 0527   PLT 128* 09/16/2013 0527   MCV 94.3 09/16/2013 0527   MCH 32.2 09/16/2013 0527   MCHC 34.1 09/16/2013 0527   RDW 15.5 09/16/2013 0527   LYMPHSABS 1.3 09/16/2013 0527   MONOABS 2.6* 09/16/2013 0527   EOSABS 0.1 09/16/2013 0527   BASOSABS 0.1 09/16/2013 0527   CBG's (last  3):    Recent Labs  09/14/13 1823 09/15/13 0004 09/15/13 0525  GLUCAP 123* 114* 101*   LFT's Lab Results  Component Value Date   ALT 15 09/14/2013   AST 27 09/14/2013   ALKPHOS 150* 09/14/2013   BILITOT 0.7 09/14/2013    Studies/Results: Dg Chest Port 1 View  09/15/2013   CLINICAL DATA:  Close day injury.  Leukocytosis.  EXAM: PORTABLE CHEST - 1 VIEW  COMPARISON:  09/02/2013  FINDINGS: Left base subsegmental atelectasis.  No segmental consolidation, pulmonary edema or gross pneumothorax.  Poor inspiration.  Tracheostomy tube removed.  Heart size within normal limits.  IMPRESSION: Left base subsegmental atelectasis without segmental consolidation noted.   Electronically Signed   By: Bridgett Larsson M.D.   On: 09/15/2013 09:55    Medications:  I have reviewed the patient's current medications. Scheduled Medications: . amantadine  100 mg Oral BID  . antiseptic oral rinse  15 mL Mouth Rinse QID  . chlorhexidine  15 mL Mouth Rinse BID  . enoxaparin (LOVENOX) injection  30 mg Subcutaneous Q24H  . feeding supplement (ENSURE)  1 Container Oral TID BM  . feeding supplement (PRO-STAT SUGAR FREE 64)  30 mL Per Tube BID  . feeding supplement (VITAL 1.5 CAL)  237 mL Per Tube 5 X Daily  . free water  200 mL Per Tube 5  X Daily  . hydrocerin   Topical BID  . QUEtiapine  50 mg Oral BID  . sulfamethoxazole-trimethoprim  1 tablet Oral Q12H  . 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  Medical Problem List and Plan:  1. DVT Prophylaxis/Anticoagulation: Pharmaceutical: Lovenox  2. Pain Management: Prn oxycodone for outward signs of pain.  3. Mood: Currently too low level to evaluate.  4. Neuropsych: This patient is not capable of making decisions on his own behalf.  5. MRSA from wound cx - changed Keflex to Septra 11/22 -improved low grade fever - monitor white count (recheck CBC in AM) -continue contact precautions  6. Left tibial plateau Fx--s/p ORIF: NWB with no ROM---KI at all times.  7. Left humerus Fx--s/p ORIF: NWB and PROM with OT/PT  8. Left shin and heel wound: Continue bilateral PRAFO. Pressure relief measures--shin wound due to KI irritation?. WOC consult .air mattress overlay.  9. ABLA: stable  10. Dysphagia: Continue TF for supplement. Continue D1, honey liquids with assistance.  11. Agitation: seroquel bid prn. Continue ativan prn. Vail bed for safety, mitten  -on amantadine for attention/arousal   Length of stay, days: 4  Valerie A. Felicity Coyer, MD 09/17/2013, 9:23 AM

## 2013-09-17 NOTE — Progress Notes (Signed)
Occupational Therapy Note  Patient Details  Name: Cory [name] MRN: [id number] Date of Birth: 02/22/1981 Today's Date: 09/17/2013 Therapy co treat with PT 13:00-1345 (25 min)   Pt with no signs/symptoms of pain. Treatment session focused on pt reaction to various stimuli. Pt turns head to therapists voice x 3 upon entering room, pt then inconsistent with response to auditory stimuli presented in both right and left field. Pt with verbal grunting to ROM with L knee, consistently 3 out of 3 trials. Seated edge of bed with total A for trunk control for oral care, pt with decreased biting reflex today with mouth care and washing face.  Magic cup trials with focus on visual tracking. Pt inconsistently able to track spoon in L visual field (25%), 0% tracking in R visual field.    [name] [facility] 09/17/2013, 4:23 PM

## 2013-09-17 NOTE — Progress Notes (Signed)
Physical Therapy Note  Patient Details  Name: Cory [name] MRN: [id number] Date of Birth: 10-26-1981 Today's Date: 09/17/2013  Time: 1300-1345 45 minutes co tx with OT  1:1 Pt with no signs/symptoms of pain.  Treatment session focused on pt reaction to various stimuli.  Pt turns head to therapists voice x 3 upon entering room, pt then inconsistent with response to auditory stimuli.  Pt with verbal grunting to ROM with L knee, consistently 3 out of 3 trials.  Seated edge of bed with total A for trunk control for oral care, pt with decreased biting reflex today.  Pt allowed therapist to wash his face with decreased biting reflex noted.  Magic cup trials with focus on visual tracking.  Pt inconsistently able to track spoon in L visual field (25%), 0% tracking in R visual field.   Aleila Syverson 09/17/2013, 2:20 PM

## 2013-09-18 ENCOUNTER — Encounter (HOSPITAL_COMMUNITY): Payer: Medicaid - Out of State

## 2013-09-18 ENCOUNTER — Inpatient Hospital Stay (HOSPITAL_COMMUNITY): Payer: Medicaid - Out of State | Admitting: Physical Therapy

## 2013-09-18 ENCOUNTER — Inpatient Hospital Stay (HOSPITAL_COMMUNITY): Payer: Medicaid Other | Admitting: Occupational Therapy

## 2013-09-18 ENCOUNTER — Inpatient Hospital Stay (HOSPITAL_COMMUNITY): Payer: Medicaid Other | Admitting: *Deleted

## 2013-09-18 ENCOUNTER — Inpatient Hospital Stay (HOSPITAL_COMMUNITY): Payer: Medicaid Other | Admitting: Speech Pathology

## 2013-09-18 ENCOUNTER — Inpatient Hospital Stay (HOSPITAL_COMMUNITY): Payer: Medicaid - Out of State | Admitting: Occupational Therapy

## 2013-09-18 DIAGNOSIS — S069X9A Unspecified intracranial injury with loss of consciousness of unspecified duration, initial encounter: Secondary | ICD-10-CM

## 2013-09-18 LAB — CBC
HCT: 28.8 % — ABNORMAL LOW (ref 39.0–52.0)
Hemoglobin: 9.5 g/dL — ABNORMAL LOW (ref 13.0–17.0)
MCH: 31.1 pg (ref 26.0–34.0)
MCHC: 33 g/dL (ref 30.0–36.0)
MCV: 94.4 fL (ref 78.0–100.0)
RBC: 3.05 MIL/uL — ABNORMAL LOW (ref 4.22–5.81)

## 2013-09-18 MED ORDER — WHITE PETROLATUM GEL
Status: AC
  Administered 2013-09-18: 0.2
  Filled 2013-09-18: qty 5

## 2013-09-18 NOTE — Consult Note (Addendum)
WOC wound follow up Wound type: Left anterior wound with full thickness wound; cm with 50% tightly adhered slough/eschar and 50% dark red dry wound bed.  No odor, small amt yellow drainage.  Plan: Xeroform to promote healing to area. Left heel with previous blister which has ruptured and evolved into full thickness tissue loss and is positive for MRSA.  3X2X.2cm, loose skin flap to wound edges, wound bed 100% red and moist, mod yellow drainage, no odor.   Dressing procedure/placement/frequency: Aquacel to absorb drainage and provide antimicrobial benefits.  It is difficult to float left heel since pt agitated and constantly moving in Cole bed. Please re-consult if further assistance is needed. Thank-you,  Cammie Mcgee MSN, RN, CWOCN, [place], CNS [phone]

## 2013-09-18 NOTE — Progress Notes (Signed)
Orthopedic Tech Progress Note Patient Details:  Cory [name] [birth year] [id number] Replacement binder Ortho Devices Type of Ortho Device: Abdominal binder Ortho Device/Splint Interventions: Casandra Doffing 09/18/2013, 12:59 PM

## 2013-09-18 NOTE — Progress Notes (Addendum)
Speech Language Pathology Daily Session Note  Patient Details  Name: Cory [name] MRN: [id number] Date of Birth: [birth year]  Today's Date: 09/18/2013 Time: 1914-7829 Time Calculation (min): 60 min  Short Term Goals: Week 1: SLP Short Term Goal 1 (Week 1): Pt will localize response with auditory stimuli with 50% of opportunities with max A multimodal cueing.  SLP Short Term Goal 2 (Week 1): Pt will focus attention to a functioanl task for 5 seconds with Max A multimodal cueing  SLP Short Term Goal 3 (Week 1): Pt will visually track a functional item with 50% of opportunities with Max A multimodal cueing   Skilled Therapeutic Interventions: Treatment focus on arousal and localized response to auditory and visual stimuli. Pt required total assist +2 to transfer from supine to sit EOB and transfer to tilt in space wheelchair. SLP facilitated session by providing auditory stimuli (calling pt's name) with a tactile cue and pt demonstrated a localized response by turning his head in 25% of opportunities and made eye contact X 1 throughout the session. Pt also demonstrated vocalizations X 1, which sounded like "uh huh" in response to a functional question, "does this hurt." Pt consumed trials of puree textures and demonstrated spontaneous throat clear X 2 and multiple swallows (3-4) with a  tsp bolus, suspect due to increase mucous in pharynx. Pt did not demonstrate any tracking throughout the session. Continue current plan of care.    FIM:  Comprehension Comprehension Mode: Auditory Comprehension: 1-Understands basic less than 25% of the time/requires cueing 75% of the time Expression Expression: 1-Expresses basis less than 25% of the time/requires cueing greater than 75% of the time. Social Interaction Social Interaction: 1-Interacts appropriately less than 25% of the time. May be withdrawn or combative. Problem Solving Problem Solving: 1-Solves basic less than 25% of the time - needs direction  nearly all the time or does not effectively solve problems and may need a restraint for safety Memory Memory: 1-Recognizes or recalls less than 25% of the time/requires cueing greater than 75% of the time  Pain No s/s of pain throughout the session   Therapy/Group: Individual Therapy  Gwyneth Fernandez 09/18/2013, 10:54 AM

## 2013-09-18 NOTE — Progress Notes (Signed)
Physical Therapy Session Note  Patient Details  Name: Cory [name] MRN: [id number] Date of Birth: 02-29-1981  Today's Date: 09/18/2013 Time: 1300 (cotreat with OT (JLS) 1300-1400)-1330 Time Calculation (min): 30 min  Short Term Goals: Week 1:  PT Short Term Goal 1 (Week 1): Pt will participate 10% with functional transfer PT Short Term Goal 2 (Week 1): Pt will demo focused attention to functional activity  x 10 seconds with max A PT Short Term Goal 3 (Week 1): Pt will maintain static seated balance for therapeutic activity x 30 seconds with pt assisting 25%  Skilled Therapeutic Interventions/Progress Updates:    Co treat with OT with focus of treatment on patient's response to stimuli. See chart below for introduction to different stimuli and pt's responses.   Stimuli introduced     Response  Auditory:  Name called    No purposeful response    Sound     No purposeful response    Hands Clapping   No purposeful response   Gustatory:  Lemon Swab    No purposeful response    Something sweet  n/a   Olfactory:  Coffee    No Purposeful response    Peanut Butter    No purposeful response    Alcohol Swab    No purposeful response   Pain:   Sternal rub    No purposeful response    Pin Prick    Withdrawal on left UE 3/3 times    Nail Bed pressure   Withdrawal on left UE 3/3 times   Temperature:  Bottom of feet/ sides of face    Hot     No purposeful response    Cold     No purposeful response   Placed cold washcloth on patient's face and 3/3 times pt made attempts with shaking head to remove cloth.  Applied stimuli in a combination of sitting EOB and supine with rest breaks as appropriate. When introducing listed stimuli concluded pt with decreased body movements and closed his eyes. Transferred into w/c total A +2 and then transitioned on to flat mat in the dayroom- focus on ROM of 4 extremities while breaking up extensor tone in supine and in long sitting. Pt with increased tone in  right wrist and hand noted today. Pt presented with groaning and increased body movements with hip and knee flexion to right LE. Continued to work on appropriate strapping for w/c seating system.  Therapy Documentation Precautions:  Precautions Precautions: Fall Precaution Comments: peg with abdominal binder, LUE splint, and L LE KI.   Required Braces or Orthoses: Knee Immobilizer - Left Knee Immobilizer - Left: On at all times Other Brace/Splint: Bil PRAFOs and L UE and LE splint can be removed for ROM with Ot/PT only.   Restrictions Weight Bearing Restrictions: Yes LUE Weight Bearing: Non weight bearing LLE Weight Bearing: Non weight bearing Locomotion : Ambulation Ambulation/Gait Assistance: Not tested (comment)   See FIM for current functional status  Therapy/Group: Co-Treatment with OT  Chipper Herb. Zain Lankford, PT, DPT  09/18/2013, 2:18 PM

## 2013-09-18 NOTE — Progress Notes (Signed)
Subjective/Complaints:  In enclosure bed. Awakened after 3 verbal cues. Nonverbal this morning Afebrile.  A  review of systems has been performed and if not noted above is otherwise negative. .  Objective: Vital Signs: Blood pressure 108/65, pulse 102, temperature 98 F (36.7 C), temperature source Oral, resp. rate 19, height 5' 2.99" (1.6 m), weight 43.908 kg (96 lb 12.8 oz), SpO2 99.00%. No results found.  Recent Labs  09/16/13 0527 09/18/13 0714  WBC 24.3* 18.5*  HGB 10.1* 9.5*  HCT 29.6* 28.8*  PLT 128* 163   No results found for this basename: NA, K, CL, CO, GLUCOSE, BUN, CREATININE, CALCIUM,  in the last 72 hours CBG (last 3)  No results found for this basename: GLUCAP,  in the last 72 hours  Wt Readings from Last 3 Encounters:  09/15/13 43.908 kg (96 lb 12.8 oz)  09/13/13 46.811 kg (103 lb 3.2 oz)  09/13/13 46.811 kg (103 lb 3.2 oz)    Physical Exam:  Constitutional:    Restless, lethargic Eyes: Right eye exhibits no discharge. Left eye exhibits no discharge.  Neck:  Janina Mayo out  Cardiovascular: Normal rate and regular rhythm.  Respiratory: Effort normal.  GI: Soft.  Lymphadenopathy:  He has no cervical adenopathy.  Neurological:  Left gaze preference. Moves eyes when cued but doesn't make eye contact. Continues to reach and grab at objects on the left. Frequently bikes. Occasionally follows a simple command but very inconsistent. see notes above Musc:  Left arm remains in splint, left leg in splint. G/J tube in place.  Skin: left heel/foot ulcers and wounds present with some odor,    Assessment/Plan: 1. 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 Physiatrist is providing close team supervision and 24 hour management of active medical problems listed below. Physiatrist and rehab team continue to assess barriers to discharge/monitor patient  progress toward functional and medical goals. FIM: FIM - Bathing Bathing: 1: Two helpers  FIM - Upper Body Dressing/Undressing Upper body dressing/undressing: 1: Two helpers FIM - Lower Body Dressing/Undressing Lower body dressing/undressing: 1: Two helpers  FIM - Toileting Toileting: 0: No continent bowel/bladder events this shift  FIM - Archivist Transfers: 1-Two helpers (per [employer], NT total assist of 2 helpers)  FIM - Games developer Transfer: 1: Two helpers  FIM - Locomotion: Wheelchair Locomotion: Wheelchair: 1: Total Assistance/staff pushes wheelchair (Pt<25%) FIM - Locomotion: Ambulation Ambulation/Gait Assistance: Not tested (comment) Locomotion: Ambulation: 0: Activity did not occur  Comprehension Comprehension Mode: Auditory Comprehension: 1-Understands basic less than 25% of the time/requires cueing 75% of the time  Expression Expression Mode: Verbal Expression: 1-Expresses basis less than 25% of the time/requires cueing greater than 75% of the time.  Social Interaction Social Interaction: 1-Interacts appropriately less than 25% of the time. May be withdrawn or combative.  Problem Solving Problem Solving: 1-Solves basic less than 25% of the time - needs direction nearly all the time or does not effectively solve problems and may need a restraint for safety  Memory Memory: 1-Recognizes or recalls less than 25% of the time/requires cueing greater than 75% of the time  Medical Problem List and Plan:  1. DVT Prophylaxis/Anticoagulation: Pharmaceutical: Lovenox  2. Pain Management: Prn oxycodone for outward signs of pain.  3. Mood: Currently too low level to evaluate.  4. Neuropsych: This patient is not capable of making decisions on his own behalf.  5. Recurrent Fever: afebrile but  white count climbing  -wound culture with s aureus---begin keflex. Await culture and sens 6. Left tibial plateau Fx--s/p ORIF: NWB with no ROM---KI  at all times.  7. Left humerus Fx--s/p ORIF: NWB and PROM with OT/PT  8. Leucocytosis: see above 9. Left shin and heel wound: Continue bilateral PRAFO. Pressure relief measures--shin wound due to KI irritation?. WOC consult  .air mattress overlay.   10. ABLA: hgb 10.4 11. Dysphagia: Continue TF for supplement. Continue D1, honey liquids with assistance.  12. Agitation: seroquel-hold at this dose for now Continue to use ativan prn. Vail bed for safety, mitten  LOS (Days) 5 A FACE TO FACE EVALUATION WAS PERFORMED  Claudette Laws E 09/18/2013 8:10 PM

## 2013-09-18 NOTE — Progress Notes (Signed)
Occupational Therapy Note  Patient Details  Name: Cory [name] MRN: [id number] Date of Birth: 1981/03/17 Today's Date: 09/18/2013  Co treat with physical therapy for the first hour. See chart below for introduction to different stimuli and pt's responses.    Stimuli introduced     Response  Auditory:   Name called    No purposeful response       Sound     No purposeful response       Hands Clapping    No purposeful response  Gustatory:  Lemon Swab    No purposeful response         Something sweet    n/a  Olfactory:  Coffee     No Purposeful response                   Peanut Butter    No purposeful response         Alcohol Swab    No purposeful response  Pain:        Sternal rub    No purposeful response                   Pin Prick     Withdrawal on left UE 3/3 times        Nail Bed pressure    Withdrawal on left UE 3/3 times  Temperature: Bottom of feet/ sides of face        Hot      No purposeful response        Cold     No purposeful response Placed cold washcloth on patient's face and 3/3 times pt made attempts with shaking head to remove cloth.   Applied stimuli in a combination of sitting EOB and supine with rest breaks as appropriate. When introducing listed stimuli concluded pt with decreased body movements and closed his eyes. Transferred into w/c total A +2 and then transitioned on to flat mat in the dayroom- focus on ROM of 4 extremities while breaking up extensor tone in supine and in long sitting. Pt with increased tone in right wrist and hand noted today. Pt presented with groaning and increased body movements with hip and knee flexion to right LE.  Continued to work on appropriate strapping for w/c seating system with rehab tech.   Performed oral care with total A due to biting reflex. Total A  +2 transfer back into bed. Pt's body movements quieted as he was left in vail bed.     [name] [facility] 09/18/2013, 2:54 PM

## 2013-09-18 NOTE — Progress Notes (Signed)
Occupational Therapy Session Note  Patient Details  Name: Cory [name] MRN: [id number] Date of Birth: 1981/08/24  Today's Date: 09/18/2013 Time: 1100-1200 Time Calculation (min): 60 min  Short Term Goals: Week 1:  OT Short Term Goal 1 (Week 1): Pt would turn head to auditory stimuli 50% of the time OT Short Term Goal 2 (Week 1): Pt would attend to name being called with max A 50% of the time OT Short Term Goal 3 (Week 1): Pt would track functional object with 50% of opportunties  OT Short Term Goal 4 (Week 1): Pt would turn head to auditory stimuli 50% of the time  Skilled Therapeutic Interventions/Progress Updates:     Introduced different stimuli for opportunities for purposeful responses.  Pt supine in bed  - started at first with applying cold wash cloth on his face- 3/3 times pt shook head to take off washcloth from face. See below chart   Stimuli introduced     Response  Auditory:   Name called    No purposeful response       Sound     No purposeful response       Hands Clapping    No purposeful response  Gustatory:  Lemon Swab    No purposeful response         Something sweet    n/a  Olfactory:  Coffee     No Purposeful response                   Peanut Butter    No purposeful response         Alcohol Swab    No purposeful response  Pain:        Sternal rub    No purposeful response                   Pin Prick     Withdrawal on left UE 3/3 times        Nail Bed pressure    Withdrawal on left UE 3/3 times  Temperature: Bottom of feet/ sides of face        Hot      No purposeful response        Cold     No purposeful response   Offered stimuli auditory - olfactory while pt was sitting EOB with total A to maintain safe sitting position.  Pt became restless and incontinent of urine - returned to supine to be changed and concluded introducing stimuli in supine. Pt became very still and fell asleep after session.   Therapy Documentation Precautions:   Precautions Precautions: Fall Precaution Comments: peg with abdominal binder, LUE splint, and L LE KI.   Required Braces or Orthoses: Knee Immobilizer - Left Knee Immobilizer - Left: On at all times Other Brace/Splint: Bil PRAFOs and L UE and LE splint can be removed for ROM with Ot/PT only.   Restrictions Weight Bearing Restrictions: Yes LUE Weight Bearing: Non weight bearing LLE Weight Bearing: Non weight bearing  See FIM for current functional status  Therapy/Group: Individual Therapy  [name] [facility] 09/18/2013, 2:41 PM

## 2013-09-18 NOTE — Progress Notes (Signed)
Physical Therapy Note  Patient Details  Name: Rajesh Wyss MRN: [id number] Date of Birth: 10-08-1981 Today's Date: 09/18/2013  Time: [id number] 56 minutes  1:1 No signs/symptoms of pain. Treatment focused on pt response to various stimuli.  Pt transfers with +2 assist, no active participation.  Pt responds to name being called <10% of the time.  Pt responding <10% of time to light or moderate touching.  Attempt to have pt respond to wet, cold wash cloth on hands and inner thighs, no visual or verbal response, no head turns.  Pt with increased restlessness this session, frequent trunk movements in all directions, increased biting reflex, unable to wash face due to pt biting at washcloth throughout.  Pt with more LE and L UE movements throughout session.  No purposeful movements seen.  Decreased focused response to stimuli this session.   Merwin Breden 09/18/2013, 10:59 AM

## 2013-09-19 ENCOUNTER — Inpatient Hospital Stay (HOSPITAL_COMMUNITY): Payer: Medicaid Other | Admitting: *Deleted

## 2013-09-19 ENCOUNTER — Inpatient Hospital Stay (HOSPITAL_COMMUNITY): Payer: Medicaid Other | Admitting: Occupational Therapy

## 2013-09-19 ENCOUNTER — Encounter (HOSPITAL_COMMUNITY): Payer: Medicaid - Out of State

## 2013-09-19 ENCOUNTER — Inpatient Hospital Stay (HOSPITAL_COMMUNITY): Payer: Medicaid Other | Admitting: Physical Therapy
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
## 2013-09-19 NOTE — Progress Notes (Signed)
Physical Therapy Note  Patient Details  Name: Cory [name] MRN: [id number] Date of Birth: 1981-05-24 Today's Date: 09/19/2013  Time 1: 730-824 54 minutes  1:1 No signs/symptoms of pain.  Pt found in bed, decreased restlessness noted.  Washing face and performing hygiene tasks, pt with no response to cold water on forehead or inner thigh.  NMR to B LEs to reduce extensor tone by encouraging flexion.  Pt with improved tolerance to flexion of L LE, continues to actively resist flexion of R knee, forcefully pushing into extension.  Able to increase R knee flexion with long duration gentle stretching.  Spoons of apple juice presented to pt with pt actively moving head to all directions (increased time and tactile cues for turning head to R) to take sips of juice.  When pt finished he rolled away from stimulus and closed eyes.  Pt with improved alertness this session, decreased restlessness, eyes open throughout treatment.    Time 2: 1100-1145 45 minutes co-tx with OT  1:1 no signs/symptoms of pain.  Stimuli introduced seated edge of bed.  Pt able to tolerate oral care with decreased instance of biting reflex.  Better able to tolerate face being washed with decreased biting reflex observed. . Stimuli introduced     Response  Auditory:   Name called: 3/10       Sound: 0       Hands Clapping: 0  Gustatory:  Lemon Swab: 0         Something sweet  Olfactory:  Coffee: 0                   Peanut Butter        Alcohol Swab: 0  Pain:        Sternal rub                   Pin Prick        Nail Bed pressure 2/2 on L UE, 0 R UE, 0 R LE, 0 L LE  Temperature: Bottom of feet/ sides of face        Hot: 0        Cold   Time 3: 1330-1430 60 minutes co-tx with OT  1:1 no signs/symptoms of pain.  Pt with decreased response to name being called this afternoon, turns head 1/10 trials.  Seated in w/c with H strap positioned for safety.  Attempted trials of magic cup to get yes/no responses, pt with no  purposeful response to verbal/gesturing questions of "do you want more".  3/3 withdraw/closing eyes in response to bright light in eyes.  0/3 response to door slamming.  Attempted PROM to R knee to decrease extension tone, pt with increased resistance to hip and knee flexion this afternoon.  Haddon Fyfe 09/19/2013, 8:39 AM

## 2013-09-19 NOTE — Progress Notes (Signed)
Occupational Therapy Session Note  Patient Details  Name: Rally Ouch MRN: [id number] Date of Birth: 07-May-1981  Today's Date: 09/19/2013 Time: 0865-7846 Short Term Goals: Week 1:  OT Short Term Goal 1 (Week 1): Pt would turn head to auditory stimuli 50% of the time OT Short Term Goal 2 (Week 1): Pt would attend to name being called with max A 50% of the time OT Short Term Goal 3 (Week 1): Pt would track functional object with 50% of opportunties  OT Short Term Goal 4 (Week 1): Pt would turn head to auditory stimuli 50% of the time  Skilled Therapeutic Interventions/Progress Updates:    1:1 Time 2: 1100-1145  45 minutes co-tx with OT  1:1 no signs/symptoms of pain. Stimuli introduced seated edge of bed looking for purposeful response Stimuli introduced     Response  Auditory:   Name called:    3/10 times - response with raised eyebrows and head turn to the right 2x and left 1x       Sound    0       Hands Clapping   0  Gustatory:  Lemon Swab   0         Something sweet   0  Olfactory:  Coffee    0                    Peanut Butter   0        Alcohol Swab   0  Pain:        Sternal rub                      Pin Prick    Response only on left UE         Nail Bed pressure   Response on left UE 2/2  Temperature: Bottom of feet/ sides of face        Hot     0         Cold    0   Pt did consistently take cold wash cloth off face by shaking head. Raised eyebrows and a nod to yes/no questions 1/4. Pt with decreased biting reflex with oral care- allowing increased time of oral care with decr biting reflex except when touch the tongue. Pt allowed washing his face with no signs of biting. Tried music (type he enjoys per mom) to see if he would track music - no tracking- but calmed body movement and closed eyes. When asked if he was tired- large sigh.    Therapy Documentation Precautions:  Precautions Precautions: Fall Precaution Comments: peg with abdominal binder, LUE splint, and L  LE KI.   Required Braces or Orthoses: Knee Immobilizer - Left Knee Immobilizer - Left: On at all times Other Brace/Splint: Bil PRAFOs and L UE and LE splint can be removed for ROM with Ot/PT only.   Restrictions Weight Bearing Restrictions: Yes LUE Weight Bearing: Non weight bearing LLE Weight Bearing: Non weight bearing Pain: No c/o pain See FIM for current functional status  Therapy/Group: Individual Therapy  [name] [facility] 09/19/2013, 11:58 AM

## 2013-09-19 NOTE — Progress Notes (Signed)
Occupational Therapy Session Note  Patient Details  Name: Cory [name] MRN: [id number] Date of Birth: 03/08/1981  Today's Date: 09/19/2013 Time: 1330-1400 Time Calculation (min): 30 min  Short Term Goals: Week 1:  OT Short Term Goal 1 (Week 1): Pt would turn head to auditory stimuli 50% of the time OT Short Term Goal 2 (Week 1): Pt would attend to name being called with max A 50% of the time OT Short Term Goal 3 (Week 1): Pt would track functional object with 50% of opportunties  OT Short Term Goal 4 (Week 1): Pt would turn head to auditory stimuli 50% of the time  Skilled Therapeutic Interventions/Progress Updates:  60 minutes co-tx with PT  1:1 no signs/symptoms of pain. Pt with decreased response to name being called this afternoon, turns head 1/10 trials. Seated in w/c with H strap positioned for safety. Attempted trials of magic cup to get yes/no responses, pt with no purposeful response to verbal/gesturing questions of "do you want more". 3/3 withdraw/closing eyes in response to bright light in eyes. 0/3 response to door slamming.   Took pics of H strap on pt in the chair for staff education for proper positioning.   Therapy Documentation Precautions:  Precautions Precautions: Fall Precaution Comments: peg with abdominal binder, LUE splint, and L LE KI.   Required Braces or Orthoses: Knee Immobilizer - Left Knee Immobilizer - Left: On at all times Other Brace/Splint: Bil PRAFOs and L UE and LE splint can be removed for ROM with Ot/PT only.   Restrictions Weight Bearing Restrictions: Yes LUE Weight Bearing: Non weight bearing LLE Weight Bearing: Non weight bearing Pain:  no signs of pain   See FIM for current functional status  Therapy/Group: Individual Therapy  [name] [facility] 09/19/2013, 3:57 PM

## 2013-09-19 NOTE — Progress Notes (Signed)
Speech Language Pathology Daily Session Note  Patient Details  Name: Cory [name] MRN: [id number] Date of Birth: Jun 24, 1981  Today's Date: 09/19/2013 Time: 4098-1191 Time Calculation (min): 30 min  Short Term Goals: Week 1: SLP Short Term Goal 1 (Week 1): Pt will localize response with auditory stimuli with 50% of opportunities with max A multimodal cueing.  SLP Short Term Goal 2 (Week 1): Pt will focus attention to a functioanl task for 5 seconds with Max A multimodal cueing  SLP Short Term Goal 3 (Week 1): Pt will visually track a functional item with 50% of opportunities with Max A multimodal cueing   Skilled Therapeutic Interventions: Co-treatment with PT with focus on cognitive goals. Upon arrival, pt restless in bed. Pt required total +2 to transfer to tilt in space wheelchair and pt appeared calm and less restless while sitting up. Pt turned and made eye contact with clinician in 25% of opportunities when an auditory and tactile cue was presented. SLP facilitated session by providing yes/no questions in regards to wants/needs with meals.  Pt shook his head yes/no with 25% of opportunities at beginning of the session, however, responses decreased as pt became more fatigued.  Pt consumed ~2 oz of honey-thick liquids via tsp and 4 oz of puree textures with an intermittent throat clear X 2. Pt also attempted to wipe mouth with a washcloth X 2 when presented a visual and auditory cue and had a decreased biting reflex throughout the session.    FIM:  Comprehension Comprehension Mode: Auditory Comprehension: 1-Understands basic less than 25% of the time/requires cueing 75% of the time Expression Expression Mode: Verbal Expression: 1-Expresses basis less than 25% of the time/requires cueing greater than 75% of the time. Social Interaction Social Interaction: 1-Interacts appropriately less than 25% of the time. May be withdrawn or combative. Problem Solving Problem Solving: 1-Solves basic  less than 25% of the time - needs direction nearly all the time or does not effectively solve problems and may need a restraint for safety Memory Memory: 1-Recognizes or recalls less than 25% of the time/requires cueing greater than 75% of the time FIM - Eating Eating Activity: 1: Helper feeds patient  Pain No s/s of pain   Therapy/Group: Individual Therapy  Chantavia Bazzle 09/19/2013, 1:50 PM

## 2013-09-19 NOTE — Progress Notes (Signed)
Physical Therapy Session Note  Patient Details  Name: Cory [name] MRN: [id number] Date of Birth: 11/16/1980  Today's Date: 09/19/2013 Time: 6962-9528 and 4132-4401 Time Calculation (min): 30 min and 25 min  Short Term Goals: Week 1:  PT Short Term Goal 1 (Week 1): Pt will participate 10% with functional transfer PT Short Term Goal 2 (Week 1): Pt will demo focused attention to functional activity  x 10 seconds with max A PT Short Term Goal 3 (Week 1): Pt will maintain static seated balance for therapeutic activity x 30 seconds with pt assisting 25%  Skilled Therapeutic Interventions/Progress Updates:    AM Session: Co-treatment with SLP with focus on cognitive goals. Upon arrival, pt restless in bed. Pt required total +2 to transfer to tilt in space wheelchair and pt appeared calm and less restless while sitting up. Continued modifications with H strap for wheelchair safety. Pt turned and made eye contact with clinician in 25% of opportunities when an auditory and tactile cue was presented. SLP facilitated session by providing yes/no questions in regards to wants/needs with meals. Pt shook his head yes/no with 25% of opportunities at beginning of the session, however, responses decreased as pt became more fatigued. Pt consumed ~2 oz of honey-thick liquids via tsp and 4 oz of puree textures with an intermittent throat clear X 2. Pt also attempted to wipe mouth with a washcloth X 2 when presented a visual and auditory cue and had a decreased biting reflex throughout the session. Patient transferred back to bed at end of session.  PM Session: No signs/symptoms of pain. Patient received sitting in wheelchair with tech present for supervision, decreased restlessness noted. Patient responds x1 to name called, turning head right. Patient shook head yes x1 when asked if tired, no further purposeful responses noted during session. NMR to R LE to reduce extensor tone by encouraging flexion. Patient continues  to actively resist R hip and knee flexion, forcefully pushing into extension. Patient with increased verbalizations and grimacing with stretching into R hip and knee flexion. Able to increase R hip and knee flexion prolonged passive stretching and rhythmic movements. After stretching, patient rolled away from stimulus and closed eyes. Patient with improved alertness this session, decreased restlessness, eyes open throughout treatment.    Therapy Documentation Precautions:  Precautions Precautions: Fall Precaution Comments: peg with abdominal binder, LUE splint, and L LE KI.   Required Braces or Orthoses: Knee Immobilizer - Left Knee Immobilizer - Left: On at all times Other Brace/Splint: Bil PRAFOs and L UE and LE splint can be removed for ROM with Ot/PT only.   Restrictions Weight Bearing Restrictions: Yes LUE Weight Bearing: Non weight bearing LLE Weight Bearing: Non weight bearing Locomotion : Ambulation Ambulation/Gait Assistance: Not tested (comment)   See FIM for current functional status  Therapy/Group: Individual Therapy in PM and Co-Treatment with SLP in AM  Ogden S Harsimran Westman S. Fumi Guadron, PT, DPT 09/19/2013, 2:17 PM
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
## 2013-09-20 ENCOUNTER — Ambulatory Visit (HOSPITAL_COMMUNITY): Payer: Medicaid - Out of State | Admitting: Occupational Therapy

## 2013-09-20 ENCOUNTER — Inpatient Hospital Stay (HOSPITAL_COMMUNITY): Payer: Medicaid Other | Admitting: *Deleted

## 2013-09-20 ENCOUNTER — Inpatient Hospital Stay (HOSPITAL_COMMUNITY): Payer: Medicaid Other | Admitting: Speech Pathology

## 2013-09-20 LAB — CREATININE, SERUM: Creatinine, Ser: 0.69 mg/dL (ref 0.50–1.35)

## 2013-09-20 NOTE — Progress Notes (Addendum)
Physical Therapy Note  Patient Details  Name: Cory [name] MRN: [id number] Date of Birth: 12/11/1980 Today's Date: 09/20/2013  Time: 815-900 45 minutes  1:1 No signs/symptoms of pain.  Pt with increased restlessness throughout session, frequently sitting up and rolling, B LEs into flexion more often than previously observed.  PROM to B LEs to reduce extension and encourage flexion, pt with grunting with R knee flexion, withdrawing 50% of the time to R knee flexion, facial grimace to L knee flexion, no withdrawal observed.  Seated edge of bed pt unsafe despite 2+ assist due to increased extensor tone.  Sit to stand multiple reps with pt keeping 0% wt on L LE, frequently moving L LE through space while standing.  Pt able to bear wt and maintain standing 30-45 seconds at a time with manual facilitation for upright posture.  Visual tracking to R with spoonfuls of juice, pt with improved tracking past midline on R with tactile and verbal cues.  Time 2: 1100-1140 40 minutes co-tx with PT  See PT note from Perlie Mayo, PT.   Safa Derner 09/20/2013, 9:06 AM

## 2013-09-20 NOTE — Progress Notes (Signed)
Occupational Therapy Session Note  Patient Details  Name: Cory [name] MRN: [id number] Date of Birth: 01-22-1981  Today's Date: 09/20/2013 Time: 1005-1030 Time Calculation (min): 25 min  Short Term Goals: Week 1:  OT Short Term Goal 1 (Week 1): Pt would turn head to auditory stimuli 50% of the time OT Short Term Goal 2 (Week 1): Pt would attend to name being called with max A 50% of the time OT Short Term Goal 3 (Week 1): Pt would track functional object with 50% of opportunties  OT Short Term Goal 4 (Week 1): Pt would turn head to auditory stimuli 50% of the time  Skilled Therapeutic Interventions/Progress Updates:    1:1 1005-1030 Co treat with SLP. Pt received in bed and had turned himself 180 in the bed with his head at the foot of the bed. Pt with purposeful attempts to use LB/LE momentum to come into long sitting 2x spontaneously.  Pt allowed peri hygiene and change of brief after incontinent episode of urine.  Pt tracking 3/5 to midline with auditory cue. Transitioned into the chair with +2 total A. Engaged in therapeutic eating with focus on visual tracking, visual response to auditory stimuli and initiation of answering yes/no questions.    2nd session 1330-1430 1:1 Pt received in bed and pt had been incontinent of urine. Pt allowed hygiene and changing of gown and brief. Pt visually turned towards therapists voice (threapist standing on pt's left). Min tactile cues for rolling.  Transitioned into w/c with H strap donned. Engaged in visual tracking activities; pt able to achieve tracking to midline. Pt inconsistently answered yes/no with head nods with 25% accuracy when answered with therapeutic eating. Pt with increased visual tracking to therapist on the left to auditory stimuli. Transitioned to mat to attempt sidelying with flexion to decr extension patterns- unable to fully achieve. Pt also able to turn onto his stomach today in bed and on the mat. Pt left with PT for next session.  Decreased biting reflex.  Therapy Documentation Precautions:  Precautions Precautions: Fall Precaution Comments: peg with abdominal binder, LUE splint, and L LE KI.   Required Braces or Orthoses: Knee Immobilizer - Left Knee Immobilizer - Left: On at all times Other Brace/Splint: Bil PRAFOs and L UE and LE splint can be removed for ROM with Ot/PT only.   Restrictions Weight Bearing Restrictions: Yes LUE Weight Bearing: Non weight bearing LLE Weight Bearing: Non weight bearing Pain:  no indications of pain in session 1 or session 2  See FIM for current functional status  Therapy/Group: Individual Therapy  [name] [facility] 09/20/2013, 2:36 PM

## 2013-09-20 NOTE — Progress Notes (Signed)
Physical Therapy Session Note  Patient Details  Name: Cory [name] MRN: [id number] Date of Birth: 1981/04/25  Today's Date: 09/20/2013 Time: 1115 (co treat with PT (KD) [phone] and 1430-1454 Time Calculation (min): 25 min and 24 min  Short Term Goals: Week 1:  PT Short Term Goal 1 (Week 1): Pt will participate 10% with functional transfer PT Short Term Goal 2 (Week 1): Pt will demo focused attention to functional activity  x 10 seconds with max A PT Short Term Goal 3 (Week 1): Pt will maintain static seated balance for therapeutic activity x 30 seconds with pt assisting 25%  Skilled Therapeutic Interventions/Progress Updates:    AM Session: no signs/symptoms of pain. Patient with increased response to name being called, turns head approx 25% of trials. Seated in w/c with H strap positioned for safety. Attempted trials of juice to get yes/no responses, patient with purposeful response to verbal/gesturing questions of "do you want more" approx 25% of trials, shaking head no. Patient attempts to hand washcloth/spoon/comb to therapist approx 1/3 of trials. Patient returned to bed with +2 assist, RN present.  PM Session: No signs/symptoms of pain. Patient received seated in w/c with H strap positioned for safety. Patient with response to name being called x2 behind and on right side and x2 behind and on left side. Patient with response 1/2 trials to tapping of L deltoid. When asked "are you tired?" patient shook head no, unsure if purposeful or reflexive. NMR to R LE to reduce extensor tone by encouraging flexion. Patient continues to actively resist R hip and knee flexion, forcefully pushing into extension. Able to increase R hip and knee flexion prolonged passive stretching and rhythmic movements. Able to achieve increased R hip and knee flexion with assisted R ankle DF. Patient returned to bed with +2 assist, lights off for decreased stimulus and to encourage rest.  Therapy  Documentation Precautions:  Precautions Precautions: Fall Precaution Comments: peg with abdominal binder, LUE splint, and L LE KI.   Required Braces or Orthoses: Knee Immobilizer - Left Knee Immobilizer - Left: On at all times Other Brace/Splint: Bil PRAFOs and L UE and LE splint can be removed for ROM with Ot/PT only.   Restrictions Weight Bearing Restrictions: Yes LUE Weight Bearing: Non weight bearing LLE Weight Bearing: Non weight bearing Locomotion : Ambulation Ambulation/Gait Assistance: Not tested (comment)   See FIM for current functional status  Therapy/Group: Co-Treatment  Clarisse Gouge S Lety Cullens S. Jailah Willis, PT, DPT 09/20/2013, 12:10 PM

## 2013-09-20 NOTE — Progress Notes (Signed)
Speech Language Pathology Weekly Progress Note  Patient Details  Name: Cory [name] MRN: [id number] Date of Birth: December 11, 1980  Today's Date: 09/20/2013  Short Term Goals: Week 1: SLP Short Term Goal 1 (Week 1): Pt will localize response with auditory stimuli with 50% of opportunities with max A multimodal cueing.  SLP Short Term Goal 1 - Progress (Week 1): Met SLP Short Term Goal 2 (Week 1): Pt will focus attention to a functioanl task for 5 seconds with Max A multimodal cueing  SLP Short Term Goal 2 - Progress (Week 1): Not met SLP Short Term Goal 3 (Week 1): Pt will visually track a functional item with 50% of opportunities with Max A multimodal cueing  SLP Short Term Goal 3 - Progress (Week 1): Not met  New Short Term Goals:  Week 2: SLP Short Term Goal 1 (Week 2): Pt will localize response in the right field  of enviornment with auditory stimuli with 50% of opportunities with max A multimodal cueing.  SLP Short Term Goal 2 (Week 2): Pt will focus attention to a functioanl task for 5 seconds with Max A multimodal cueing  SLP Short Term Goal 3 (Week 2): Pt will visually track a functional item with 50% of opportunities with Max A multimodal cueing   Weekly Progress Updates: Pt has made small, inconsistent gains this reporting period and has met 1 of 3 STG's this reporting period. 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  Pt is also currently consuming Dys. 1 textures with honey-thick liquids via tsp without overt s/s of aspiration. Pt would benefit from continued skilled SLP intervention to maximize cognitive and swallowing function and overall functional independence.    SLP Intensity: Minumum of 1-2 x/day, 30 to 90 minutes SLP  Frequency: 5 out of 7 days SLP Duration/Estimated Length of Stay: 2 week trial  SLP Treatment/Interventions: Cueing hierarchy;Cognitive remediation/compensation;Environmental controls;Internal/external aids;Therapeutic Exercise;Speech/Language facilitation;Therapeutic Activities;Patient/family education;Dysphagia/aspiration precaution training;Functional tasks     Mariellen Blaney 09/20/2013, 4:04 PM

## 2013-09-20 NOTE — Progress Notes (Signed)
Speech Language Pathology Daily Session Note  Patient Details  Name: Lori Liew MRN: [id number] Date of Birth: [birth year]  Today's Date: 09/20/2013 Time: 1030-1100 Time Calculation (min): 30 min  Short Term Goals: Week 1: SLP Short Term Goal 1 (Week 1): Pt will localize response with auditory stimuli with 50% of opportunities with max A multimodal cueing.  SLP Short Term Goal 2 (Week 1): Pt will focus attention to a functioanl task for 5 seconds with Max A multimodal cueing  SLP Short Term Goal 3 (Week 1): Pt will visually track a functional item with 50% of opportunities with Max A multimodal cueing   Skilled Therapeutic Interventions: Co-treatment with OT with focus on cognitive goals. Upon arrival, pt restless in bed. Pt required total +2 to transfer to tilt in space wheelchair and pt appeared calm and less restless while sitting up. Pt turned and made eye contact with clinician in 25% of opportunities when an auditory and tactile cue was presented. SLP facilitated session by providing yes/no questions in regards to wants/needs with meals.  Pt shook his head no X 2 at beginning of the session, however, responses decreased as pt became more fatigued.  Pt consumed ~2 oz of honey-thick liquids via tsp and 1 oz of puree textures with an intermittent throat clear X 2. Pt also attempted to wipe mouth with a washcloth X 2 when presented a visual and auditory cue and had a decreased biting reflex throughout the session.    FIM:  Comprehension Comprehension Mode: Auditory Comprehension: 1-Understands basic less than 25% of the time/requires cueing 75% of the time Expression Expression Mode: Nonverbal Expression: 1-Expresses basis less than 25% of the time/requires cueing greater than 75% of the time. Social Interaction Social Interaction: 1-Interacts appropriately less than 25% of the time. May be withdrawn or combative. Problem Solving Problem Solving: 1-Solves basic less than 25% of the  time - needs direction nearly all the time or does not effectively solve problems and may need a restraint for safety Memory Memory: 1-Recognizes or recalls less than 25% of the time/requires cueing greater than 75% of the time FIM - Eating Eating Activity: 1: Helper feeds patient  Pain Pain Assessment Pain Assessment: No/denies pain  Therapy/Group: Individual Therapy  Aadarsh Cozort 09/20/2013, 3:48 PM
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
## 2013-09-22 ENCOUNTER — Inpatient Hospital Stay (HOSPITAL_COMMUNITY): Payer: Medicaid - Out of State | Admitting: Occupational Therapy

## 2013-09-22 ENCOUNTER — Inpatient Hospital Stay (HOSPITAL_COMMUNITY): Payer: Medicaid Other | Admitting: *Deleted

## 2013-09-22 ENCOUNTER — Inpatient Hospital Stay (HOSPITAL_COMMUNITY): Payer: Medicaid Other | Admitting: Speech Pathology

## 2013-09-22 ENCOUNTER — Inpatient Hospital Stay (HOSPITAL_COMMUNITY): Payer: Medicaid Other | Admitting: Physical Therapy

## 2013-09-22 MED ORDER — SULFAMETHOXAZOLE-TRIMETHOPRIM 200-40 MG/5ML PO SUSP
20.0000 mL | Freq: Two times a day (BID) | ORAL | Status: AC
  Administered 2013-09-22 – 2013-09-24 (×6): 20 mL
  Filled 2013-09-22 (×7): qty 20

## 2013-09-22 NOTE — Progress Notes (Signed)
Speech Language Pathology Daily Session Note  Patient Details  Name: Cory [name] MRN: [id number] Date of Birth: 1981-08-12  Today's Date: 09/22/2013 Time: 1510-1600 Time Calculation (min): 50 min  Short Term Goals: Week 2: SLP Short Term Goal 1 (Week 2): Pt will localize response in the right field  of enviornment with auditory stimuli with 50% of opportunities with max A multimodal cueing.  SLP Short Term Goal 2 (Week 2): Pt will focus attention to a functioanl task for 5 seconds with Max A multimodal cueing  SLP Short Term Goal 3 (Week 2): Pt will visually track a functional item with 50% of opportunities with Max A multimodal cueing   Skilled Therapeutic Interventions: Skilled treatment session focus on addressing cognitive goals.  Patient turned and made eye contact with clinician in 25% of opportunities when an auditory and tactile cue was presented on right and 80% when stimuli was presented from left. SLP facilitated session with yes/no questions in regards to wants/needs with meals; however, patient did not attempt head nods today.   Patient consumed 2 oz of honey-thick liquids via tsp and 4 oz of puree textures with an intermittent throat clear X 1. Patient allowed SLP to wipe mouth with visual cues with only 1 attempt to open mouth to wash cloth; however, with verbal and reintroduction of visual cues patient was able to self-correct.     FIM:  Comprehension Comprehension Mode: Auditory Comprehension: 1-Understands basic less than 25% of the time/requires cueing 75% of the time Expression Expression Mode: Nonverbal Expression: 1-Expresses basis less than 25% of the time/requires cueing greater than 75% of the time. Social Interaction Social Interaction: 1-Interacts appropriately less than 25% of the time. May be withdrawn or combative. Problem Solving Problem Solving: 1-Solves basic less than 25% of the time - needs direction nearly all the time or does not effectively solve  problems and may need a restraint for safety Memory Memory: 1-Recognizes or recalls less than 25% of the time/requires cueing greater than 75% of the time FIM - Eating Eating Activity: 1: Helper feeds patient  Pain Pain Assessment Pain Assessment: No/denies pain  Therapy/Group: Individual Therapy  Charlane Ferretti., CCC-SLP [phone]  [name] 09/22/2013, 4:34 PM

## 2013-09-22 NOTE — Progress Notes (Signed)
Occupational Therapy Session Note  Patient Details  Name: Cory [name] MRN: [id number] Date of Birth: [birth year]  Today's Date: 09/22/2013 Time: 1000-1100 Time Calculation (min): 60 min   Skilled Therapeutic Interventions/Progress Updates:    1:1 Pt in bed when arrived and had been incontinent of urine. Pt rolled in bed with min tactile cuing and pt allowed hygiene to be performed. Command given to come sit up EOB- after more than reasonable amt of time sat up.  Pt assisted to sit EOB and max A to sit EOB for transition with +2 into the w/c.  Focus on following simple 1 step commands while providing more than reasonable amt of time for a response. Placed a warm washcloth in his right had with command to "wash face." Pt brought wash cloth up to his face and wiped both sides of face (no biting of the washcloth). Pt with increased right finger movements. Pt given a comb and command to brush hair with right hand. AT first try pt bought comb to his mouth. With tactile cues and HOH to initiate bring comb to hair- pt able to stroke through hair three times. Command to open mouth with extra time and demonstrative cue- pt opened mouth. Command to stick tongue out with demonstrative cue; pt stuck out tongue with extra time. Pt then persevered on sticking out tongue 3x more. Engaged in functional eating with objective to nod yes or no to questions. Answer with head nod 1/15 times (no). Command to take spoon with right hand with max tactile cues for following direction visually tracking to hands at midline pt able to take spoon from therapist and then to mouth (no biting of the spoon). Pt with consistent tracking to me at midline or slightly to the left to auditory stimulus of his name being called. Pt did bring his hands together 1/3 times with application of lotion and command to "rub hands." Pt left with nursing for meds in the w/c.   Therapy Documentation Precautions:  Precautions Precautions:  Fall Precaution Comments: peg with abdominal binder, LUE splint, and L LE KI.   Required Braces or Orthoses: Knee Immobilizer - Left Knee Immobilizer - Left: On at all times Other Brace/Splint: Bil PRAFOs and L UE and LE splint can be removed for ROM with Ot/PT only.   Restrictions Weight Bearing Restrictions: Yes LUE Weight Bearing: Non weight bearing LLE Weight Bearing: Non weight bearing Pain:  no signs or symptoms of pain   See FIM for current functional status  Therapy/Group: Individual Therapy  [name] [facility] 09/22/2013, 12:03 PM

## 2013-09-22 NOTE — Progress Notes (Signed)
Occupational Therapy Weekly Progress Note  Patient Details  Name: Cory [name] MRN: [id number] Date of Birth: 1981-01-04  Today's Date: 09/22/2013 Time: 1000-1100 Time Calculation (min): 60 min  Patient has met 0 of 4 short term goals.  Pt has made small, inconsistent gains this reporting period  Currently, pt is demonstrating behaviors consistent of an emerging Rancho Level III and demonstrates increased ability to track to midline, respond to pain, localize a response to auditory stimuli in his left field of environment and inconsistently utilizing head nods to answer yes/no questions. Pt is no beginning to move right UE proximal and distally and presents with decreased bite reflex.   Patient continues to demonstrate the following deficits: muscle weakness, decreased cardiorespiratoy endurance, impaired timing and sequencing, abnormal tone, unbalanced muscle activation, decreased coordination and decreased motor planning, decreased visual acuity, decreased visual perceptual skills and decreased visual motor skills, decreased midline orientation and decreased attention to right, decreased initiation, decreased attention, decreased awareness, decreased problem solving, decreased safety awareness, decreased memory and delayed processing and decreased sitting balance, decreased standing balance, decreased postural control, hemiplegia, decreased balance strategies and difficulty maintaining precautions and therefore will continue to benefit from skilled OT intervention to enhance overall performance with Reduce care partner burden.  Patient progressing toward long term goals..  Continue plan of care.  OT Short Term Goals Week 1:  OT Short Term Goal 1 (Week 1): Pt would turn head to auditory stimuli 50% of the time OT Short Term Goal 1 - Progress (Week 1): Progressing toward goal OT Short Term Goal 2 (Week 1): Pt would attend to name being called with max A 50% of the time OT Short Term Goal 2 -  Progress (Week 1): Progressing toward goal OT Short Term Goal 3 (Week 1): Pt would track functional object with 50% of opportunties  OT Short Term Goal 3 - Progress (Week 1): Progressing toward goal OT Short Term Goal 4 (Week 1): Pt would turn head to auditory stimuli 50% of the time OT Short Term Goal 4 - Progress (Week 1): Progressing toward goal Week 2:  OT Short Term Goal 1 (Week 2): Pt will demonstraion a localized response in right field with audiotory stimulus 75% of the time with max cuing OT Short Term Goal 2 (Week 2): Pt will demonstrate focused attention for 3 seconds with max cuing for functional task OT Short Term Goal 3 (Week 2): Pt will visually track an object 50% of opportunities with max cuing of  a  functional object  Skilled Therapeutic Interventions/Progress Updates:      Therapy Documentation Precautions:  Precautions Precautions: Fall Precaution Comments: peg with abdominal binder, LUE splint, and L LE KI.   Required Braces or Orthoses: Knee Immobilizer - Left Knee Immobilizer - Left: On at all times Other Brace/Splint: Bil PRAFOs and L UE and LE splint can be removed for ROM with Ot/PT only.   Restrictions Weight Bearing Restrictions: Yes LUE Weight Bearing: Non weight bearing LLE Weight Bearing: Non weight bearing  See FIM for current functional status  Therapy/Group: Individual Therapy  [name] [place] 09/22/2013, 2:05 PM

## 2013-09-22 NOTE — Progress Notes (Signed)
Subjective/Complaints:  Remains nonverbal Turns in bed side to side, nonpurposeful Afebrile.  A  review of systems has been performed and if not noted above is otherwise negative. .  Objective: Vital Signs: Blood pressure 118/77, pulse 109, temperature 97.3 F (36.3 C), temperature source Axillary, resp. rate 18, height 5' 2.99" (1.6 m), weight 43.545 kg (96 lb), SpO2 99.00%. No results found. No results found for this basename: WBC, HGB, HCT, PLT,  in the last 72 hours  Recent Labs  09/20/13 0500  CREATININE 0.69   CBG (last 3)  No results found for this basename: GLUCAP,  in the last 72 hours  Wt Readings from Last 3 Encounters:  09/20/13 43.545 kg (96 lb)  09/13/13 46.811 kg (103 lb 3.2 oz)  09/13/13 46.811 kg (103 lb 3.2 oz)    Physical Exam:  Constitutional:    Restless, lethargic Eyes: Right eye exhibits no discharge. Left eye exhibits no discharge.  Neck:  Janina Mayo out  Cardiovascular: Normal rate and regular rhythm.  Respiratory: Effort normal.  GI: Soft.  Lymphadenopathy:  He has no cervical adenopathy.  Neurological:   tracks poorly. Continues to reach and grab at objects on the right. . Not following commands Musc:  Left arm remains in splint, left leg in splint. G/J tube in place.  Skin: no drainage around G/J tube , hypertrophic granulation tissue treated with silver nitrate   Assessment/Plan: 1. 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 Physiatrist is providing close team supervision and 24 hour management of active medical problems listed below. Physiatrist and rehab team continue to assess barriers to discharge/monitor patient progress toward functional and medical goals. FIM: FIM - Bathing Bathing: 1: Two helpers  FIM - Upper Body Dressing/Undressing Upper body dressing/undressing: 1: Two helpers FIM - Lower Body  Dressing/Undressing Lower body dressing/undressing: 1: Two helpers  FIM - Toileting Toileting: 0: No continent bowel/bladder events this shift  FIM - Archivist Transfers: 0-Activity did not occur  FIM - Games developer Transfer: 1: Two helpers  FIM - Locomotion: Wheelchair Locomotion: Wheelchair: 0: Activity did not occur FIM - Locomotion: Ambulation Ambulation/Gait Assistance: Not tested (comment) Locomotion: Ambulation: 0: Activity did not occur  Comprehension Comprehension Mode: Auditory Comprehension: 1-Understands basic less than 25% of the time/requires cueing 75% of the time  Expression Expression Mode: Nonverbal Expression: 1-Expresses basis less than 25% of the time/requires cueing greater than 75% of the time.  Social Interaction Social Interaction: 1-Interacts appropriately less than 25% of the time. May be withdrawn or combative.  Problem Solving Problem Solving: 1-Solves basic less than 25% of the time - needs direction nearly all the time or does not effectively solve problems and may need a restraint for safety  Memory Memory: 1-Recognizes or recalls less than 25% of the time/requires cueing greater than 75% of the time  Medical Problem List and Plan:  1. DVT Prophylaxis/Anticoagulation: Pharmaceutical: Lovenox  2. Pain Management: Prn oxycodone for outward signs of pain.  3. Mood: Currently too low level to evaluate.  4. Neuropsych: This patient is not capable of making decisions on his own behalf.  5. Recurrent Fever: afebrile but white count climbing  -wound culture with MRSA---change keflex to bactrim.  6. Left tibial plateau Fx--s/p ORIF: NWB with no ROM---KI at all times.  7. Left humerus Fx--s/p ORIF: NWB and PROM with OT/PT  8. Leucocytosis: improving 9. Left shin and heel wound: Continue bilateral  PRAFO. Pressure relief measures--shin wound due to KI irritation?. WOC consult  .air mattress overlay.   10. ABLA: hgb 9.5,  check stool guaic 11. Dysphagia: Continue TF for supplement. Continue D1, honey liquids with assistance.  12. Agitation: seroquel-hold at this dose for now Continue to use ativan prn. Vail bed for safety, mitten, amantadine   LOS (Days) 9 A FACE TO FACE EVALUATION WAS PERFORMED  Claudette Laws E 09/22/2013 11:48 AM

## 2013-09-22 NOTE — Progress Notes (Signed)
Physical Therapy Session Note  Patient Details  Name: Cory [name] MRN: [id number] Date of Birth: 12/25/1980  Today's Date: 09/22/2013 Time: 4098-1191 ([place] with PT (KD) [phone]) and 1100-1140 Time Calculation (min): 27 min and 40 min  Skilled Therapeutic Interventions/Progress Updates:    First session: No signs/symptoms of pain. PROM to B LEs to reduce extension and encourage flexion, pt with grunting with R knee flexion, withdrawing 50% of the time to R knee flexion, facial grimace to L knee flexion, no withdrawal observed. Seated edge of bed pt 2+ assist due to increased extensor tone and rapid trunk flexion. Sit to stand multiple reps with pt keeping 0% wt on L LE, frequently moving L LE through space while standing. Pt able to bear wt and maintain standing 5-15 seconds at a time with manual facilitation for upright posture.   Stimuli introduced     Response  Auditory:   Name called    40% response with raised eyebrows and head turn to appropriate side         Sound     No response to door slamming       Hands Clapping    Approx 25% of trials   Olfactory:  Coffee     No response        Alcohol Swab    No response  Pain:       Nail Bed pressure    Withdrawal 100% of trials with L UE, R UE, and L LE    Patient sat up in bed. Visual tracking to R with spoonfuls of juice, pt with improved tracking past midline on R with tactile and verbal cues. Patient able to grasp spoon with assist in R hand and bring to mouth, however appears reflexive.   Second session: No signs/symptoms of pain. Seated in w/c with H strap positioned for safety. Attempted trials of simple commands with self-care tasks to assess ability to follow commands and production of yes/no responses, pt with no purposeful response/head nods/shaking to verbal/gesturing questions. When grasping items in R hand (comb, lotion, wash cloth), patient attempts to bring to mouth every trial, however, with hand over hand  assist/correction, patient able to perform given command 50% of trials. For example, when instructed to comb hair, patient would bring comb to mouth initially, but when R hand redirected to hair, patient able to comb hair for a few strokes. Similar with trials of washing face: Patient initially brings washcloth to mouth, but when redirected, able to use wash cloth to wash face for approximately 5-10 seconds. Patient without any purposeful response when lotion put in hands and instructed to rub hands together or rub lotion on leg. On one trial, patient did grasp lotion in R hand then transfer it to hold it in L hand-unsure if reflexive or purposeful. Towards end of session, patient maintaining eyes closed and responding 0% of trials with simple commands. Patient returned to bed with +2 assist.  Therapy Documentation Precautions:  Precautions Precautions: Fall Precaution Comments: peg with abdominal binder, LUE splint, and L LE KI.   Required Braces or Orthoses: Knee Immobilizer - Left Knee Immobilizer - Left: On at all times Other Brace/Splint: Bil PRAFOs and L UE and LE splint can be removed for ROM with Ot/PT only.   Restrictions Weight Bearing Restrictions: Yes LUE Weight Bearing: Non weight bearing LLE Weight Bearing: Non weight bearing  See FIM for current functional status  Therapy/Group: Co-Treatment with Cory [name], PT both sessions  Cory [name]  Cory [name]. Cory [name], PT, DPT  09/22/2013, 9:14 AM

## 2013-09-22 NOTE — Progress Notes (Signed)
Occupational Therapy Session Note  Patient Details  Name: Cory [name] MRN: [id number] Date of Birth: December 04, 1980  Today's Date: 09/22/2013 Time: 1430-1500 Time Calculation (min): 30 min  Short Term Goals: Week 2:  OT Short Term Goal 1 (Week 2): Pt will demonstraion a localized response in right field with audiotory stimulus 75% of the time with max cuing OT Short Term Goal 2 (Week 2): Pt will demonstrate focused attention for 3 seconds with max cuing for functional task OT Short Term Goal 3 (Week 2): Pt will visually track an object 50% of opportunities with max cuing of  a  functional object  Skilled Therapeutic Interventions/Progress Updates:    Continued to provide functional objects as stimulus in left field for opportunities for a localized response. Transitioned bd to w/c with total A +2. Eye brows raises consistently with name being called, grooming including washing face, rubbing hands together with lotion, visual tracking left to midline, brushing hair. Pt with decreased reponses this pm; closing his eyes. Pt presents more fatigued this pm and less restless.   Therapy Documentation Precautions:  Precautions Precautions: Fall Precaution Comments: peg with abdominal binder, LUE splint, and L LE KI.   Required Braces or Orthoses: Knee Immobilizer - Left Knee Immobilizer - Left: On at all times Other Brace/Splint: Bil PRAFOs and L UE and LE splint can be removed for ROM with Ot/PT only.   Restrictions Weight Bearing Restrictions: Yes LUE Weight Bearing: Non weight bearing LLE Weight Bearing: Non weight bearing Pain:  no indications of pain   See FIM for current functional status  Therapy/Group: Individual Therapy  [name] [facility] 09/22/2013, 3:43 PM

## 2013-09-23 ENCOUNTER — Inpatient Hospital Stay (HOSPITAL_COMMUNITY): Payer: Medicaid Other | Admitting: *Deleted

## 2013-09-23 ENCOUNTER — Inpatient Hospital Stay (HOSPITAL_COMMUNITY): Payer: Medicaid Other | Admitting: Speech Pathology

## 2013-09-23 ENCOUNTER — Inpatient Hospital Stay (HOSPITAL_COMMUNITY): Payer: Medicaid - Out of State

## 2013-09-23 ENCOUNTER — Inpatient Hospital Stay (HOSPITAL_COMMUNITY): Payer: Medicaid Other

## 2013-09-23 LAB — CBC WITH DIFFERENTIAL/PLATELET
Basophils Absolute: 0 10*3/uL (ref 0.0–0.1)
Basophils Relative: 0 % (ref 0–1)
Eosinophils Absolute: 0.4 10*3/uL (ref 0.0–0.7)
Eosinophils Relative: 4 % (ref 0–5)
HCT: 27.6 % — ABNORMAL LOW (ref 39.0–52.0)
Hemoglobin: 9.4 g/dL — ABNORMAL LOW (ref 13.0–17.0)
Lymphocytes Relative: 11 % — ABNORMAL LOW (ref 12–46)
Lymphs Abs: 1.3 10*3/uL (ref 0.7–4.0)
MCH: 31.3 pg (ref 26.0–34.0)
MCHC: 34.1 g/dL (ref 30.0–36.0)
MCV: 92 fL (ref 78.0–100.0)
Monocytes Absolute: 1.4 10*3/uL — ABNORMAL HIGH (ref 0.1–1.0)
Monocytes Relative: 12 % (ref 3–12)
Neutro Abs: 8.6 10*3/uL — ABNORMAL HIGH (ref 1.7–7.7)
Neutrophils Relative %: 73 % (ref 43–77)
Platelets: 250 10*3/uL (ref 150–400)
RBC: 3 MIL/uL — ABNORMAL LOW (ref 4.22–5.81)
RDW: 15.7 % — ABNORMAL HIGH (ref 11.5–15.5)
WBC: 11.8 10*3/uL — ABNORMAL HIGH (ref 4.0–10.5)
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
## 2013-09-23 NOTE — Progress Notes (Signed)
Physical Therapy Session Note  Patient Details  Name: Cory [name] MRN: [id number] Date of Birth: Nov 16, 1980  Today's Date: 09/23/2013 Time: 0900 ([place] with OT (KG) [phone] Time Calculation (min): 30 min  Short Term Goals: Week 1:  PT Short Term Goal 1 (Week 1): Pt will participate 10% with functional transfer PT Short Term Goal 2 (Week 1): Pt will demo focused attention to functional activity  x 10 seconds with max A PT Short Term Goal 3 (Week 1): Pt will maintain static seated balance for therapeutic activity x 30 seconds with pt assisting 25%  Skilled Therapeutic Interventions/Progress Updates:    Patient received supine in bed. Co-treatment session with OT with emphasis on response to stimuli. Patient transferred bed>wheelchair with +2 assist, positioned in tilt in space wheelchair with H strap for safety. Patient maintaining eyes closed more than usual this session, however still turns head approx 50-75% of the time when name is called from left side. Patient inconsistent with head turns when name called from R side. Attempted to have patient engage in purposeful self-care activities: washing face, combing hair, looking in mirror; as well as engage in purposeful recreational-like activities: presented with magazine, tools, etc. Patient with inconsistent responses during all activities, all of which appearing non purposeful with the exception of removing cold, wet wash cloth from face, where patient shakes head until it is removed. With [facility] assist, patient able to remove wash cloth from head one time, but unable to repeat.  Patient with consistent response to apparent noxious stimulation when PROM for R hip/knee flexion, noted facial grimacing and increased vocalizations, rolling away from stimulus. Patient with strong extensor tone in R LE, difficult to break through to flexion. Patient grimaces with this motion, and localizes to right knee. Patient returned to enclosure bed at end  of session.  Therapy Documentation Precautions:  Precautions Precautions: Fall Precaution Comments: peg with abdominal binder, LUE splint, and L LE KI.   Required Braces or Orthoses: Knee Immobilizer - Left Knee Immobilizer - Left: On at all times Other Brace/Splint: Bil PRAFOs and L UE and LE splint can be removed for ROM with Ot/PT only.   Restrictions Weight Bearing Restrictions: Yes LUE Weight Bearing: Non weight bearing LLE Weight Bearing: Non weight bearing Locomotion : Ambulation Ambulation/Gait Assistance: Not tested (comment)   See FIM for current functional status  Therapy/Group: Co-Treatment with OT  Chipper Herb. Nature Kueker, PT, DPT  09/23/2013, 1:17 PM

## 2013-09-23 NOTE — Progress Notes (Signed)
Speech Language Pathology Daily Session Note  Patient Details  Name: Cory [name] MRN: [id number] Date of Birth: 09-09-1981  Today's Date: 09/23/2013 Time: 4098-1191 Time Calculation (min): 22 min  Short Term Goals: Week 2: SLP Short Term Goal 1 (Week 2): Pt will localize response in the right field  of enviornment with auditory stimuli with 50% of opportunities with max A multimodal cueing.  SLP Short Term Goal 2 (Week 2): Pt will focus attention to a functioanl task for 5 seconds with Max A multimodal cueing  SLP Short Term Goal 3 (Week 2): Pt will visually track a functional item with 50% of opportunities with Max A multimodal cueing   Skilled Therapeutic Interventions: Therapeutic intervention complete,targeting cognition and dysphagia.  Patient given pureed and honey thick liquids and had no s/s of aspiration. He was not able to focus or track items presented.   Movements are not purposeful, and he was not able to respond to auditory or visual stimulae.  Continue with treatment plan.    FIM:  Comprehension Comprehension Mode: Auditory Comprehension: 1-Understands basic less than 25% of the time/requires cueing 75% of the time Expression Expression: 1-Expresses basis less than 25% of the time/requires cueing greater than 75% of the time. Social Interaction Social Interaction: 1-Interacts appropriately less than 25% of the time. May be withdrawn or combative. Problem Solving Problem Solving: 1-Solves basic less than 25% of the time - needs direction nearly all the time or does not effectively solve problems and may need a restraint for safety Memory Memory: 1-Recognizes or recalls less than 25% of the time/requires cueing greater than 75% of the time FIM - Eating Eating Activity: 2: Hand over hand assist  Pain Pain Assessment Pain Assessment: No/denies pain  Therapy/Group: Individual Therapy  Lenny Pastel 09/23/2013, 4:16 PM

## 2013-09-23 NOTE — Progress Notes (Signed)
Physical Therapy Session Note  Patient Details  Name: Cory [name] MRN: [id number] Date of Birth: Dec 21, 1980  Today's Date: 09/23/2013 Time: 1415-1450 Time Calculation (min): 35 min  Short Term Goals: Week 1:  PT Short Term Goal 1 (Week 1): Pt will participate 10% with functional transfer PT Short Term Goal 2 (Week 1): Pt will demo focused attention to functional activity  x 10 seconds with max A PT Short Term Goal 3 (Week 1): Pt will maintain static seated balance for therapeutic activity x 30 seconds with pt assisting 25%  Skilled Therapeutic Interventions/Progress Updates:    Patient received supine in bed, RN and nurse tech present, administering nutrition through feeding tube. Patient with increased restlessness during feeding, spontaneously sitting up into long sitting position, rolling to B sides, oral/bite reflex towards feeding tube once in long sitting position, and using L hand to reach for feeding tube. Assisted nursing with patient positioning to increase ease with feeding. After feeding completed by nursing, passive ROM B LEs. Patient able to tolerate L LE into hip/knee flexion. Patient with increased vocalizations, grimacing, rolling, and using L hand to attempt to remove therapist's hands when passive ROM to R LE into hip/knee flexion. Tone able to be broken and maintained in flexion, but patient with consistent and purposeful reaction to this painful stimuli. Patient continues with increased restlessness throughout session, but towards end of session, maintains eyes closed and rolls away from stimuli.  Therapy Documentation Precautions:  Precautions Precautions: Fall Precaution Comments: peg with abdominal binder, LUE splint, and L LE KI.   Required Braces or Orthoses: Knee Immobilizer - Left Knee Immobilizer - Left: On at all times Other Brace/Splint: Bil PRAFOs and L UE and LE splint can be removed for ROM with Ot/PT only.   Restrictions Weight Bearing Restrictions:  Yes LUE Weight Bearing: Non weight bearing LLE Weight Bearing: Non weight bearing Locomotion : Ambulation Ambulation/Gait Assistance: Not tested (comment)   See FIM for current functional status  Therapy/Group: Individual Therapy  Chipper Herb. Yianni Skilling, PT, DPT 09/23/2013, 3:05 PM

## 2013-09-23 NOTE — Progress Notes (Signed)
Social Work Patient ID: Cory [name], male   DOB: 05-08-1981, 32 y.o.   MRN: [id number]   Contacted pt's mother via phone to discuss team conference report and schedule time for her to meet with myself and therapy supervisor, Mackie Pai.  Mother distractable and does not appear to really be listening to report.  Explained we would like to meet with her at the hospital at the first of the week.  Mother quickly states that she has a lot of trouble getting to the hospital but would try to be here at some point on Mon 12/1.  Need to discuss d/c plans and pt's care needs.  Will begin process of SNF bed search, however, need to confirm family is in agreement and education completed.  Tarrah Furuta, LCSW

## 2013-09-23 NOTE — Progress Notes (Signed)
Physical Therapy Note  Patient Details  Name: Cory [name] MRN: [id number] Date of Birth: [birth year] Today's Date: 09/23/2013 Time:  [phone]  (22 min) Pain:pt with random movements fluctuating between flexion and extension in trunk.  No verbalization or grimacing of pain Individual session  1st session: Co treat with speech therapist.   Engaged in bed mobility, transfers, focused attention and self feeding.  .  Pt. Transferred total +2 to wc.  Performeed attention to to eating.  Pt. Fed self using right UE with max assist.  Used lateral grasp to hold sppon.  Had no direct eye contact when OT called his name. Kept eyes closed 90 % of session.       Time:1530-1615  (45 min) Pain:  None observed Individual session   2nd session:  Pt lying in veil bed rolling and sitting with no focused attention.  Had pt sat on EOB and worked on following 1 step directions, focused attention, eye contact.  Pt. Did maintain eye contact for 5 seconds x4 during session   Sat EOB with decerebrate posturing.  Pt allowed tactile imput to hands and heads  but rooting response with wash cloth on right and left side of face.  Pt. Exhibited bite reflex with wash cloth.    Pt. Incontinent of bowel.  Changed depends.  Used physical and verbal cues to have pt roll to right and to left.  Marland Kitchen  RLE in extensor pattern.  Left pt in zipped veil bed with call bell inside bed.     Cory [name] 09/23/2013, 8:12 AM

## 2013-09-23 NOTE — Plan of Care (Signed)
Problem: RH PAIN MANAGEMENT Goal: RH STG PAIN MANAGED AT OR BELOW PT'S PAIN GOAL Patient will verbalize pain level and medicate as needed  Outcome: Not Progressing Patient is non verbal

## 2013-09-23 NOTE — Progress Notes (Signed)
Occupational Therapy Session Note  Patient Details  Name: Cory [name] MRN: [id number] Date of Birth: [birth year]  Today's Date: 09/23/2013 Time: 0930-1000 Time Calculation (min): 30 min   Skilled Therapeutic Interventions/Progress Updates:    Patient seen this am for OT/PT co-treatment to assess his ability to respond to various sensory stimulation, interact with his environment.  Patient easily arousable,a nd moving freely in the bed.  Patient lifted to wheelchair and positioned with help of chest straps / lap belt (H strap)  Patient reclined in tilt in space wheelchair.  Patient with limited eye opening this session.  Patient consistently turned head toward auditory stimulus to left.  Patient inconsistently turned toward same stimulus on right.  Patient with significant eye malalignment.  Unable to detect visual fixation on stimulus, for any period of time.  Eye movement appeared random, dysconjugate.  Attempted to occlude one eye at a time to determine if able to respond to visual stimulation.  No response noted. Patient with very consistent response to apparent noxious stimulation when ranging right leg into hip and knee flexion.  Patient with strong extensor patterning in right leg, difficult to break through to flexion.  Patient grimaces with this motion, and localizes to right knee.  No heat or redness noted in knee or hip joint.  No palpable fluid noted, although unable to compare to left knee with KI on.    Therapy Documentation Precautions:  Precautions Precautions: Fall Precaution Comments: peg with abdominal binder, LUE splint, and L LE KI.   Required Braces or Orthoses: Knee Immobilizer - Left Knee Immobilizer - Left: On at all times Other Brace/Splint: Bil PRAFOs and L UE and LE splint can be removed for ROM with Ot/PT only.   Restrictions Weight Bearing Restrictions: Yes LUE Weight Bearing: Non weight bearing LLE Weight Bearing: Non weight bearing    Pain:  Right knee  with PROM into flexion    See FIM for current functional status  Therapy/Group: Co-Treatment  Collier Salina 09/23/2013, 12:54 PM

## 2013-09-23 NOTE — Progress Notes (Signed)
Subjective/Complaints:  Remains nonverbal Turns in bed side to side, nonpurposeful No new issues noted by staph Afebrile.  A  review of systems has been performed and if not noted above is otherwise negative. .  Objective: Vital Signs: Blood pressure 109/61, pulse 93, temperature 99.2 F (37.3 C), temperature source Axillary, resp. rate 14, height 5' 2.99" (1.6 m), weight 43.545 kg (96 lb), SpO2 100.00%. No results found. No results found for this basename: WBC, HGB, HCT, PLT,  in the last 72 hours No results found for this basename: NA, K, CL, CO, GLUCOSE, BUN, CREATININE, CALCIUM,  in the last 72 hours CBG (last 3)  No results found for this basename: GLUCAP,  in the last 72 hours  Wt Readings from Last 3 Encounters:  09/20/13 43.545 kg (96 lb)  09/13/13 46.811 kg (103 lb 3.2 oz)  09/13/13 46.811 kg (103 lb 3.2 oz)    Physical Exam:  Constitutional:    Restless, lethargic Eyes: Right eye exhibits no discharge. Left eye exhibits no discharge.  Neck:  Janina Mayo out , stoma closed Cardiovascular: Normal rate and regular rhythm.  Respiratory: Effort normal.  GI: Soft.  Lymphadenopathy:  He has no cervical adenopathy.  Neurological:   tracks poorly. Continues to reach and grab at objects on the right. . Not following commands Musc:  Left arm remains in splint, left leg in splint. G/J tube in place.  Skin: no drainage around G/J tube , hypertrophic granulation tissue    Assessment/Plan: 1. 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 Physiatrist is providing close team supervision and 24 hour management of active medical problems listed below. Physiatrist and rehab team continue to assess barriers to discharge/monitor patient progress toward functional and medical goals. FIM: FIM - Bathing Bathing: 1: Two helpers  FIM - Upper Body Dressing/Undressing Upper  body dressing/undressing: 1: Two helpers FIM - Lower Body Dressing/Undressing Lower body dressing/undressing: 1: Two helpers  FIM - Toileting Toileting: 0: No continent bowel/bladder events this shift  FIM - Archivist Transfers: 0-Activity did not occur  FIM - Games developer Transfer: 1: Two helpers  FIM - Locomotion: Wheelchair Locomotion: Wheelchair: 0: Activity did not occur FIM - Locomotion: Ambulation Ambulation/Gait Assistance: Not tested (comment) Locomotion: Ambulation: 0: Activity did not occur  Comprehension Comprehension Mode: Auditory Comprehension: 1-Understands basic less than 25% of the time/requires cueing 75% of the time  Expression Expression Mode: Verbal Expression: 1-Expresses basis less than 25% of the time/requires cueing greater than 75% of the time.  Social Interaction Social Interaction: 1-Interacts appropriately less than 25% of the time. May be withdrawn or combative.  Problem Solving Problem Solving: 1-Solves basic less than 25% of the time - needs direction nearly all the time or does not effectively solve problems and may need a restraint for safety  Memory Memory: 1-Recognizes or recalls less than 25% of the time/requires cueing greater than 75% of the time  Medical Problem List and Plan:  1. DVT Prophylaxis/Anticoagulation: Pharmaceutical: Lovenox  2. Pain Management: Prn oxycodone for outward signs of pain.  3. Mood: Currently too low level to evaluate.  4. Neuropsych: This patient is not capable of making decisions on his own behalf.  5. Recurrent Fever: afebrile  white count declining, recheck   -wound culture with MRSA---bactrim day 6/7 6. Left tibial plateau Fx--s/p ORIF: NWB with no ROM---KI at all times.  7. Left humerus Fx--s/p ORIF: NWB and PROM  with OT/PT  8. Leucocytosis: improving 9. Left shin and heel wound: Continue bilateral PRAFO. Pressure relief measures--shin wound due to KI irritation?. WOC  consult  .air mattress overlay.   10. ABLA: hgb recheck, check stool guaic 11. Dysphagia: Continue TF for supplement. Continue D1, honey liquids with assistance.  12. Agitation: seroquel-hold at this dose for now Continue to use ativan prn. [place] bed for safety, mitten, amantadine   LOS (Days) 10 A FACE TO FACE EVALUATION WAS PERFORMED  Claudette Laws E 09/23/2013 10:36 AM

## 2013-09-24 NOTE — Progress Notes (Signed)
Patient ID: Cory [name], male   DOB: 12-29-1980, 32 y.o.   MRN: [id number] I have reviewed x-rays of humeral fracture and tibial plateau fracture.  Both x-rays show well aligned fractures but not enough evidence of healing to d/c splints.  At this point patient may be out of upper extremity splint for OT and PT sessions and also when supervised he could be in sling without splint, but needs to be in splint all times when not supervised.  Lower ext still needs to be in knee immobilizer all times except for pt/ot sessions for rom of lower ext then back in knee immobilizer.  NON-wt bearing L.LE. Could sit in w/c with leg in knee immobilizer.  If there are any questions about his status of upper or lower ext issues--contact me any time. Cory [name] [phone]

## 2013-09-24 NOTE — Progress Notes (Signed)
Subjective/Complaints:  Remains nonverbal Turns in bed side to side, nonpurposeful  reviewed xrays, ortho to f/u Afebrile.  A  review of systems has been performed and if not noted above is otherwise negative. .  Objective: Vital Signs: Blood pressure 116/74, pulse 98, temperature 98 F (36.7 C), temperature source Axillary, resp. rate 16, height 5' 2.99" (1.6 m), weight 43.545 kg (96 lb), SpO2 100.00%. Dg Tibia/fibula Left Port  09/23/2013   CLINICAL DATA:  Six weeks status post ORIF left tibial plateau fracture  EXAM: PORTABLE LEFT TIBIA AND FIBULA - 2 VIEW  COMPARISON:  None.  FINDINGS: Compression plate and screw fixation of a comminuted proximal tibial fracture, mildly displaced. Fracture lucency remains visible. Increased bony bridging.  Suspected fibular head fracture, although obscured by surgical hardware.  IMPRESSION: Healing comminuted proximal tibial fracture, mildly displaced. Fracture lucency remains visible.  Suspected proximal fibular fracture, poorly visualized.   Electronically Signed   By: Charline Bills M.D.   On: 09/23/2013 17:34   Dg Humerus Left  09/23/2013   CLINICAL DATA:  Six weeks status post ORIF left humerus  EXAM: LEFT HUMERUS - 2+ VIEW  COMPARISON:  08/28/2013  FINDINGS: Compression plate and screw fixation of a mid/ distal humeral shaft fracture, mildly comminuted. Interval callus formation. Fracture lucency remains visible.  Suspected displaced osseous fragment in the subcutaneous tissues, best visualized on the frontal view.  Overlying cast obscures fine osseous detail.  IMPRESSION: Healing mid/distal humeral shaft fracture, as described above.   Electronically Signed   By: Charline Bills M.D.   On: 09/23/2013 17:47    Recent Labs  09/23/13 1325  WBC 11.8*  HGB 9.4*  HCT 27.6*  PLT 250   No results found for this basename: NA, K, CL, CO, GLUCOSE, BUN, CREATININE, CALCIUM,  in the last 72 hours CBG (last 3)  No results found for this basename:  GLUCAP,  in the last 72 hours  Wt Readings from Last 3 Encounters:  09/20/13 43.545 kg (96 lb)  09/13/13 46.811 kg (103 lb 3.2 oz)  09/13/13 46.811 kg (103 lb 3.2 oz)    Physical Exam:  Constitutional:    Restless, lethargic Eyes: Right eye exhibits no discharge. Left eye exhibits no discharge.  Neck:  Janina Mayo out , stoma closed Cardiovascular: Normal rate and regular rhythm.  Respiratory: Effort normal.  GI: Soft.  Lymphadenopathy:  He has no cervical adenopathy.  Neurological:   tracks poorly. Continues to reach and grab at objects on the right. . Not following commands Musc:  Left arm remains in splint, left leg in splint. G/J tube in place.  Skin: no drainage around G/J tube , hypertrophic granulation tissue, improving after silver nitrate   Assessment/Plan: 1. 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 Physiatrist is providing close team supervision and 24 hour management of active medical problems listed below. Physiatrist and rehab team continue to assess barriers to discharge/monitor patient progress toward functional and medical goals. FIM: FIM - Bathing Bathing: 1: Two helpers  FIM - Upper Body Dressing/Undressing Upper body dressing/undressing: 1: Two helpers FIM - Lower Body Dressing/Undressing Lower body dressing/undressing: 1: Two helpers  FIM - Toileting Toileting: 0: No continent bowel/bladder events this shift  FIM - Archivist Transfers: 0-Activity did not occur  FIM - Games developer Transfer: 1: Two helpers  FIM - Locomotion: Wheelchair Locomotion: Wheelchair: 1: Total Assistance/staff pushes wheelchair (Pt<25%) FIM - Locomotion:  Ambulation Ambulation/Gait Assistance: Not tested (comment) Locomotion: Ambulation: 0: Activity did not occur  Comprehension Comprehension Mode: Auditory Comprehension:  1-Understands basic less than 25% of the time/requires cueing 75% of the time  Expression Expression Mode: Verbal Expression: 1-Expresses basis less than 25% of the time/requires cueing greater than 75% of the time.  Social Interaction Social Interaction: 1-Interacts appropriately less than 25% of the time. May be withdrawn or combative.  Problem Solving Problem Solving: 1-Solves basic less than 25% of the time - needs direction nearly all the time or does not effectively solve problems and may need a restraint for safety  Memory Memory: 1-Recognizes or recalls less than 25% of the time/requires cueing greater than 75% of the time  Medical Problem List and Plan:  1. DVT Prophylaxis/Anticoagulation: Pharmaceutical: Lovenox  2. Pain Management: Prn oxycodone for outward signs of pain.  3. Mood: Currently too low level to evaluate.  4. Neuropsych: This patient is not capable of making decisions on his own behalf.  5. Recurrent Fever: afebrile  white count declining, recheck   -wound culture with MRSA---bactrim day 7/7 6. Left tibial plateau Fx--s/p ORIF: NWB with no ROM---KI at all times.  7. Left humerus Fx--s/p ORIF: NWB and PROM with OT/PT  8. Leucocytosis: improving 9. Left shin and heel wound: Continue bilateral PRAFO. Pressure relief measures-- WOC consult  .air mattress overlay.   10. ABLA: hgb recheck, check stool guaic 11. Dysphagia: Continue TF for supplement. Continue D1, honey liquids with assistance.  12. Agitation: seroquel- Continue to use ativan prn. [place] bed for safety, mitten,   LOS (Days) 11 A FACE TO FACE EVALUATION WAS PERFORMED  Erick Colace 09/24/2013 8:43 AM

## 2013-09-25 ENCOUNTER — Inpatient Hospital Stay (HOSPITAL_COMMUNITY): Payer: Medicaid - Out of State | Admitting: Occupational Therapy

## 2013-09-25 ENCOUNTER — Inpatient Hospital Stay (HOSPITAL_COMMUNITY): Payer: Medicaid Other | Admitting: Speech Pathology

## 2013-09-25 ENCOUNTER — Inpatient Hospital Stay (HOSPITAL_COMMUNITY): Payer: Medicaid Other | Admitting: *Deleted

## 2013-09-25 LAB — OCCULT BLOOD X 1 CARD TO LAB, STOOL: Fecal Occult Bld: NEGATIVE

## 2013-09-25 LAB — GLUCOSE, CAPILLARY: Glucose-Capillary: 103 mg/dL — ABNORMAL HIGH (ref 70–99)

## 2013-09-25 NOTE — Progress Notes (Signed)
Occupational Therapy Session Note  Patient Details  Name: Cory [name] MRN: [id number] Date of Birth: 1981/01/19  Today's Date: 09/25/2013 Time: 1000-1100 Time Calculation (min): 60 min  Short Term Goals: Week 2:  OT Short Term Goal 1 (Week 2): Pt will demonstraion a localized response in right field with audiotory stimulus 75% of the time with max cuing OT Short Term Goal 2 (Week 2): Pt will demonstrate focused attention for 3 seconds with max cuing for functional task OT Short Term Goal 3 (Week 2): Pt will visually track an object 50% of opportunities with max cuing of  a  functional object  Skilled Therapeutic Interventions/Progress Updates:    1:1 focus on introducing basic stimuli for opportunities for purposeful responses. Pt transitioned from bed to w/c with total A.  Focus on introducing basic self care items looking for automatic purposeful response, following a basic command and initiation to a task. Pt presented more sleepy this am- closing his eyes often- however he did track towards the right 75% of the time to tactile or auditory stimuli however still unclear about his vision.  Less response to right with commands than on left.  Overall pt with decreased bilateral UE movements throughout session. Performed oral care with max cuing to decr biting, washing face with hand over hand to initiate and complete without biting washcloth, donning a shirt (showed no initiation with task), self feeding with right hand (however required total A - no initiation for use of right hand). Passed off in the w/c to physical therapy.    2nd session 1:1  No c/o pain in session  13:30-14:00  Pt in vail bed very restless; sitting up and then laying back down, sitting up using the momentum of his LEs to assist himself into sitting. Quickly came to EOB when vail bed un zipped. Total A transition from bed to w/c with H - strap placed on. Pt with increased restlessness compared to this am. Pt actively  pulling on chest strap with right hand at times and continuing to lean forward in the chair. Pt 100% consistently visually tracking to auditory stimuli of his name being called. Sat on pt's left side and engaged in self feeding magic cup. Pt took spoon with right hand (in left visual field) from cup and brought to mouth and then returned spoon to cup 4/6 times with mod tactile cue to initiate use of right hand with more than reasonable amt of time. Pt didn't bite the spoon. Pt passed off in the w/c to SLP for another therapy session.   Therapy Documentation Precautions:  Precautions Precautions: Fall Precaution Comments: peg with abdominal binder, LUE splint, and L LE KI.   Required Braces or Orthoses: Knee Immobilizer - Left Knee Immobilizer - Left: On at all times Other Brace/Splint: Bil PRAFOs and L UE and LE splint can be removed for ROM with Ot/PT only.   Restrictions Weight Bearing Restrictions: Yes LUE Weight Bearing: Non weight bearing LLE Weight Bearing: Non weight bearing Pain: Pain Assessment Pain Assessment: No/denies pain Pain Score: 0-No pain  See FIM for current functional status  Therapy/Group: Individual Therapy  [name] [facility] 09/25/2013, 2:36 PM

## 2013-09-25 NOTE — Progress Notes (Signed)
Speech Language Pathology Daily Session Note  Patient Details  Name: Cory [name] MRN: [id number] Date of Birth: Aug 24, 1981  Today's Date: 09/25/2013 Time: 1400-1455 Time Calculation (min): 55 min  Short Term Goals: Week 2: SLP Short Term Goal 1 (Week 2): Pt will localize response in the right field  of enviornment with auditory stimuli with 50% of opportunities with max A multimodal cueing.  SLP Short Term Goal 2 (Week 2): Pt will focus attention to a functioanl task for 5 seconds with Max A multimodal cueing  SLP Short Term Goal 3 (Week 2): Pt will visually track a functional item with 50% of opportunities with Max A multimodal cueing   Skilled Therapeutic Interventions: Treatment focus on introducing basic stimuli for opportunities for purposeful responses. Pt appeared restless in wheelchair upon arrival. SLP facilitated session by providing choices from field of two for snack. Pt initiated reaching for applesauce with LUE and brought to midline and attempted to grab the spoon with his R UE.  Pt self-fed 3 bites of applesauce but required Max verbal and tactile cues for utilization of small bites. Pt without overt s/s of aspiration. Pt unable to follow 1 step commands despite total A in regards to oral-motor movements but did in regards to wiping his mouth his visual and verbal cues with decreased bite reflex. Less response to right with commands than on left. Pt gestured "no" for all functional questions throughout the session. Pt with increased eye closing at end of session and pt transferred back to enclosure bed with total A + 2.    FIM:  Comprehension Comprehension Mode: Auditory Comprehension: 1-Understands basic less than 25% of the time/requires cueing 75% of the time Expression Expression Mode: Verbal Expression: 1-Expresses basis less than 25% of the time/requires cueing greater than 75% of the time. Social Interaction Social Interaction: 1-Interacts appropriately less than  25% of the time. May be withdrawn or combative. Problem Solving Problem Solving: 1-Solves basic less than 25% of the time - needs direction nearly all the time or does not effectively solve problems and may need a restraint for safety Memory Memory: 1-Recognizes or recalls less than 25% of the time/requires cueing greater than 75% of the time  Pain Pain Assessment Pain Assessment: No/denies pain Pain Score: 0-No pain  Therapy/Group: Individual Therapy  [name] 09/25/2013, 3:48 PM

## 2013-09-25 NOTE — Progress Notes (Addendum)
Subjective/Complaints:  Limited interaction with me this am. Occasional purposeful movements with therapies Afebrile.  A  review of systems has been performed and if not noted above is otherwise negative. .  Objective: Vital Signs: Blood pressure 106/64, pulse 80, temperature 97.2 F (36.2 C), temperature source Axillary, resp. rate 20, height 5' 2.99" (1.6 m), weight 43.545 kg (96 lb), SpO2 94.00%. Dg Tibia/fibula Left Port  09/23/2013   CLINICAL DATA:  Six weeks status post ORIF left tibial plateau fracture  EXAM: PORTABLE LEFT TIBIA AND FIBULA - 2 VIEW  COMPARISON:  None.  FINDINGS: Compression plate and screw fixation of a comminuted proximal tibial fracture, mildly displaced. Fracture lucency remains visible. Increased bony bridging.  Suspected fibular head fracture, although obscured by surgical hardware.  IMPRESSION: Healing comminuted proximal tibial fracture, mildly displaced. Fracture lucency remains visible.  Suspected proximal fibular fracture, poorly visualized.   Electronically Signed   By: Charline Bills M.D.   On: 09/23/2013 17:34   Dg Humerus Left  09/23/2013   CLINICAL DATA:  Six weeks status post ORIF left humerus  EXAM: LEFT HUMERUS - 2+ VIEW  COMPARISON:  08/28/2013  FINDINGS: Compression plate and screw fixation of a mid/ distal humeral shaft fracture, mildly comminuted. Interval callus formation. Fracture lucency remains visible.  Suspected displaced osseous fragment in the subcutaneous tissues, best visualized on the frontal view.  Overlying cast obscures fine osseous detail.  IMPRESSION: Healing mid/distal humeral shaft fracture, as described above.   Electronically Signed   By: Charline Bills M.D.   On: 09/23/2013 17:47    Recent Labs  09/23/13 1325  WBC 11.8*  HGB 9.4*  HCT 27.6*  PLT 250   No results found for this basename: NA, K, CL, CO, GLUCOSE, BUN, CREATININE, CALCIUM,  in the last 72 hours CBG (last 3)  No results found for this basename:  GLUCAP,  in the last 72 hours  Wt Readings from Last 3 Encounters:  09/20/13 43.545 kg (96 lb)  09/13/13 46.811 kg (103 lb 3.2 oz)  09/13/13 46.811 kg (103 lb 3.2 oz)    Physical Exam:  Constitutional:      lethargic Eyes: Right eye exhibits no discharge. Left eye exhibits no discharge.  Neck:  Janina Mayo out , stoma closed Cardiovascular: Normal rate and regular rhythm.  Respiratory: Effort normal.  GI: Soft.  Lymphadenopathy:  He has no cervical adenopathy.  Neurological:    . Continues to reach and grab at objects on the right. . Not following commands or focally responding to me today.  Musc:  Left arm remains in splint, left leg in splint. G/J tube in place.  Skin: no drainage around G/J tube , hypertrophic granulation tissue, improving after silver nitrate   Assessment/Plan: 1. 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 Physiatrist is providing close team supervision and 24 hour management of active medical problems listed below. Physiatrist and rehab team continue to assess barriers to discharge/monitor patient progress toward functional and medical goals. FIM: FIM - Bathing Bathing: 1: Two helpers  FIM - Upper Body Dressing/Undressing Upper body dressing/undressing: 1: Two helpers FIM - Lower Body Dressing/Undressing Lower body dressing/undressing: 1: Two helpers  FIM - Toileting Toileting: 0: No continent bowel/bladder events this shift  FIM - Archivist Transfers: 0-Activity did not occur  FIM - Games developer Transfer: 1: Two helpers  FIM - Locomotion: Wheelchair Locomotion: Wheelchair: 1: Total Assistance/staff pushes wheelchair (  Pt<25%) FIM - Locomotion: Ambulation Ambulation/Gait Assistance: Not tested (comment) Locomotion: Ambulation: 0: Activity did not occur  Comprehension Comprehension Mode:  Auditory Comprehension: 1-Understands basic less than 25% of the time/requires cueing 75% of the time  Expression Expression Mode: Verbal Expression: 1-Expresses basis less than 25% of the time/requires cueing greater than 75% of the time.  Social Interaction Social Interaction: 1-Interacts appropriately less than 25% of the time. May be withdrawn or combative.  Problem Solving Problem Solving: 1-Solves basic less than 25% of the time - needs direction nearly all the time or does not effectively solve problems and may need a restraint for safety  Memory Memory: 1-Recognizes or recalls less than 25% of the time/requires cueing greater than 75% of the time  Medical Problem List and Plan:  1. DVT Prophylaxis/Anticoagulation: Pharmaceutical: Lovenox  2. Pain Management: Prn oxycodone for outward signs of pain.  3. Mood: Currently too low level to evaluate.  4. Neuropsych: This patient is not capable of making decisions on his own behalf.  5. Recurrent Fever: afebrile  white count declining, recheck   -wound culture with MRSA---bactrim for 7 days 6. Left tibial plateau Fx--s/p ORIF: NWB with  ROM only with PT. Otherwise always in splint 7. Left humerus Fx--s/p ORIF: NWB- may be out of splint with therapy 8. Leucocytosis: improving 9. Left shin and heel wound: Continue bilateral PRAFO. Pressure relief measures-- WOC consult  .air mattress overlay.   10. ABLA: hgb rechecks 11. Dysphagia: Continue TF for supplement. Continue D1, honey liquids with assistance.  12. Agitation: seroquel- Continue to use ativan prn. [place] bed for safety  LOS (Days) 12 A FACE TO FACE EVALUATION WAS PERFORMED  SWARTZ,ZACHARY T 09/25/2013 8:48 AM

## 2013-09-25 NOTE — Plan of Care (Signed)
Problem: RH COGNITION-NURSING Goal: RH STG USES MEMORY AIDS/STRATEGIES W/ASSIST TO PROBLEM SOLVE STG Uses Memory Aids/Strategies With max Assistance to Problem Solve.  Outcome: Not Progressing Pt nonverbal

## 2013-09-25 NOTE — Progress Notes (Signed)
Physical Therapy Weekly Progress Note  Patient Details  Name: Eyden Dobie MRN: [id number] Date of Birth: 01/12/1981  Today's Date: 09/25/2013  Patient has met 0 of 3 short term goals.  Patient has made small, inconsistent gains in his stay on rehab thus far. Currently, patient is demonstrating behaviors consistent with an emerging Rancho Level III and demonstrates increased ability to track to midline, respond to pain, localize a response to auditory stimuli in his left field of environment and inconsistently utilizing head nods to answer yes/no questions. Patient is no beginning to move right UE proximal and distally and presents with decreased bite reflex.   Patient continues to demonstrate the following deficits: muscle weakness, decreased cardiorespiratoy endurance, impaired timing and sequencing, abnormal tone, unbalanced muscle activation, decreased coordination and decreased motor planning, decreased visual acuity, decreased visual perceptual skills and decreased visual motor skills, decreased midline orientation and decreased attention to right, decreased initiation, decreased attention, decreased awareness, decreased problem solving, decreased safety awareness, decreased memory and delayed processing and decreased sitting balance, decreased standing balance, decreased postural control, hemiplegia, decreased balance strategies and difficulty maintaining precautions and therefore will continue to benefit from skilled PT intervention to enhance overall performance with activity tolerance, balance, postural control, ability to compensate for deficits, functional use of  right upper extremity, right lower extremity, left upper extremity and left lower extremity, attention, awareness, coordination and knowledge of precautions.  Patient progressing toward long term goals..  Continue plan of care.  PT Short Term Goals Week 1:  PT Short Term Goal 1 (Week 1): Pt will participate 10% with functional  transfer PT Short Term Goal 1 - Progress (Week 1): Not met PT Short Term Goal 2 (Week 1): Pt will demo focused attention to functional activity  x 10 seconds with max A PT Short Term Goal 2 - Progress (Week 1): Not met PT Short Term Goal 3 (Week 1): Pt will maintain static seated balance for therapeutic activity x 30 seconds with pt assisting 25% PT Short Term Goal 3 - Progress (Week 1): Not met Week 2:  PT Short Term Goal 1 (Week 2): Patient will demonstrate a localized response in right field with audiotory stimulus 75% of the time with max cuing PT Short Term Goal 2 (Week 2): Pt will demonstrate focused attention for 3 seconds with max cuing for functional task PT Short Term Goal 3 (Week 2): Pt will visually track an object 50% of opportunities with max cuing of  a  functional object  Chipper Herb. Kendre Jacinto, PT, DPT 09/25/2013, 8:03 AM

## 2013-09-25 NOTE — Progress Notes (Signed)
Physical Therapy Session Note  Patient Details  Name: Cory [name] MRN: [id number] Date of Birth: Oct 02, 1981  Today's Date: 09/25/2013 Time: 0830 ([place] with OT (KP) [phone] and 1100-1145 Time Calculation (min): 60 min and 45 min  Short Term Goals: Week 2:  PT Short Term Goal 1 (Week 2): Patient will demonstrate a localized response in right field with audiotory stimulus 75% of the time with max cuing PT Short Term Goal 2 (Week 2): Pt will demonstrate focused attention for 3 seconds with max cuing for functional task PT Short Term Goal 3 (Week 2): Pt will visually track an object 50% of opportunities with max cuing of  a  functional object  Skilled Therapeutic Interventions/Progress Updates:    First session: Patient received supine in bed. Co-treatment session with OT with emphasis on purposeful/localized response to stimuli, moving towards Rancho Level III goals. Patient maintaining eyes closed more than usual this session, however still turns head 4/4 when name is called from left side. Patient with increased responsiveness with head turns when name called from R side, approx 75% of the time.   Stimuli introduced     Response  Auditory:   Name called    4/4 from left side, 75% from R side         Gustatory:  Lemon Swab    No purposeful response, however, patient does localize to lemon swab and visually tracking/looking for it when out of sight  Olfactory:  Tooth paste    No purposeful/localized response          Alcohol Swab    No purposeful/localized response  Temperature: Bottom of feet        Hot      No purposeful/localized response        Cold     No purposeful/localized response     Second session: Patient received from OT. Positioned in wheelchair with H strap for safety. Attempted to have patient engage in purposeful functional activities: combing hair, applying lotion, grabbing ball.  Patient with inconsistent responses during all activities, but does follow  commands approx 25% (4/12) of trials when instructed to "take/hand me the comb", "take/hand me the ball", "take/hand me the lotion."  Patient with consistent response to apparent noxious stimulation when PROM for R and L hip/knee flexion, noted facial grimacing and increased vocalizations, rolling away from stimulus. Patient with strong extensor tone in R LE, difficult to break through to flexion, however able to break reflexive pattern when positioned in sidelying with increased hip fleixion. Patient grimaces with this motion, and localizes to right knee. Additionally, patient with consistent response to apparent noxious stimulation with PROM for R shoulder flexion and R fingers extension with noted facial grimacing, increased vocalizations, and patient reaching across midline with L UE, attempting to remove therapist's hand. Patient returned to enclosure bed at end of session.   Therapy Documentation Precautions:  Precautions Precautions: Fall Precaution Comments: peg with abdominal binder, LUE splint, and L LE KI.   Required Braces or Orthoses: Knee Immobilizer - Left Knee Immobilizer - Left: On at all times Other Brace/Splint: Bil PRAFOs and L UE and LE splint can be removed for ROM with Ot/PT only.   Restrictions Weight Bearing Restrictions: Yes LUE Weight Bearing: Non weight bearing LLE Weight Bearing: Non weight bearing Locomotion : Ambulation Ambulation/Gait Assistance: Not tested (comment)   See FIM for current functional status  Therapy/Group: Individual Therapy for second session and Co-Treatment first session with OT  Teola Bradley  Lenard Forth, PT, DPT 09/25/2013, 12:08 PM

## 2013-09-25 NOTE — Plan of Care (Signed)
Problem: RH PAIN MANAGEMENT Goal: RH STG PAIN MANAGED AT OR BELOW PT'S PAIN GOAL Patient will verbalize pain level and medicate as needed  Outcome: Not Progressing Unable to verbalize pain level-

## 2013-09-26 ENCOUNTER — Inpatient Hospital Stay (HOSPITAL_COMMUNITY): Payer: Medicaid - Out of State | Admitting: Occupational Therapy

## 2013-09-26 ENCOUNTER — Inpatient Hospital Stay (HOSPITAL_COMMUNITY): Payer: Medicaid Other | Admitting: Speech Pathology

## 2013-09-26 ENCOUNTER — Inpatient Hospital Stay (HOSPITAL_COMMUNITY): Payer: Medicaid Other | Admitting: *Deleted

## 2013-09-26 ENCOUNTER — Inpatient Hospital Stay (HOSPITAL_COMMUNITY): Payer: Medicaid Other

## 2013-09-26 DIAGNOSIS — S069X9A Unspecified intracranial injury with loss of consciousness of unspecified duration, initial encounter: Secondary | ICD-10-CM

## 2013-09-26 DIAGNOSIS — IMO0002 Reserved for concepts with insufficient information to code with codable children: Secondary | ICD-10-CM

## 2013-09-26 LAB — OCCULT BLOOD X 1 CARD TO LAB, STOOL: Fecal Occult Bld: NEGATIVE

## 2013-09-26 NOTE — Progress Notes (Signed)
Occupational Therapy Session Note  Patient Details  Name: Champion Corales MRN: [id number] Date of Birth: 22-Jun-1981  Today's Date: 09/26/2013 Time: 1000-1055 Time Calculation (min): 55 min  Short Term Goals: Week 2:  OT Short Term Goal 1 (Week 2): Pt will demonstraion a localized response in right field with audiotory stimulus 75% of the time with max cuing OT Short Term Goal 2 (Week 2): Pt will demonstrate focused attention for 3 seconds with max cuing for functional task OT Short Term Goal 3 (Week 2): Pt will visually track an object 50% of opportunities with max cuing of  a  functional object  Skilled Therapeutic Interventions/Progress Updates:    1:1 Focus on introducing functional stimuli for opportunities for purposeful responses. Transitioned bed to w/c with total A. Focused on following simple commands related to self care including: self feeding right right hand (using a pincher grasp); pt able to track to (right and left ) the magic cup to return spoon to cup to get more food with mod A; wiping mouth with decr biting reflex, brushing hair with HOH assist with backwards chaining; grasp and release of right hand; allowing oral care to be done.  Pt much more fatigued this session and less restless; required frequent min auditory and tactile cuing for alertness.  2nd session 16:00-16:30  1:1 Called and spoke with Dr Luiz Blare about ranging and splinting of left UE.  Ortho tech was called and came up to discuss splinting options. Splint removed and ROM performed. Pt continued to present with reflexive patterns with left UE. Pt also with hard bulge along bicep- rehab MD and PA aware- no changes made to plan of care regarding left UE at this time. Splint returned to arm and ace wrapped- RN present. Pt changed after incontinence of urine and bowel. No pain indicated in session; however pt extremely restless requiring 3 ppl to assist with changing splint.   Therapy Documentation Precautions:   Precautions Precautions: Fall Precaution Comments: peg with abdominal binder, LUE splint, and L LE KI.   Required Braces or Orthoses: Knee Immobilizer - Left Knee Immobilizer - Left: On at all times Other Brace/Splint: Bil PRAFOs and L UE and LE splint can be removed for ROM with Ot/PT only.   Restrictions Weight Bearing Restrictions: Yes LUE Weight Bearing: Non weight bearing LLE Weight Bearing: Non weight bearing Pain:  no indicated pain   See FIM for current functional status  Therapy/Group: Individual Therapy  [name] Bergan [facility] 09/26/2013, 4:33 PM
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
## 2013-09-26 NOTE — Progress Notes (Signed)
Physical Therapy Session Note  Patient Details  Name: Cory [name] MRN: [id number] Date of Birth: Nov 08, 1980  Today's Date: 09/26/2013 Time: 5784-6962 and 1305-1400 Time Calculation (min): 45 min and 55 min  Short Term Goals: Week 2:  PT Short Term Goal 1 (Week 2): Patient will demonstrate a localized response in right field with audiotory stimulus 75% of the time with max cuing PT Short Term Goal 2 (Week 2): Pt will demonstrate focused attention for 3 seconds with max cuing for in preparation for bed<>wheelchair transfer. PT Short Term Goal 3 (Week 2): Pt will visually track a functional object 50% of opportunities with max cuing  Skilled Therapeutic Interventions/Progress Updates:     AM Session: Patient received supine in bed, increased restlessness this AM. Patient very inconsistent with response to name being called, regardless of which side it is called from. Patient with consistent response to apparent noxious stimulation when PROM for R and L hip/knee flexion, noted facial grimacing and increased vocalizations, rolling away from stimulus. Patient with strong extensor tone in R LE, difficult to break through to flexion, however able to break reflexive pattern when positioned in sidelying with increased hip fleixion. Patient grimaces with this motion, and localizes to right knee. Additionally, patient with consistent response to apparent noxious stimulation with PROM for R shoulder flexion and R fingers extension with noted facial grimacing, increased vocalizations, and patient reaching across midline with L UE, attempting to remove therapist's hand.   Trials with purposeful/localized response to stimuli with trials of spoonfuls of magic cup. Visual tracking across visual field to R with spoonfuls of magic cup, patient with improved tracking past midline and to the R with tactile and verbal cues. Patient continues to demonstrate reflexive movements of trunk flexion/extension. Patient able  to take paper towel with tactile cues and wipe his mouth x2 without demonstrating bite reflex. Additionally, when patient is given spoon in R hand, he is able to bring it to his mouth, however, patient continues to demonstrate non-purposeful movements of R UE by bringing inedible objets to mouth as well.  PM Session: Patient received supine in bed. No signs/symptoms of pain. Patient continues to present with increased restlessness throughout session. Positioned in wheelchair with H strap for safety. Session focused on purposeful/localized response to stimuli. Patient with decreased response to name calling, regardless of which side. Focus on reaching for and grabbing functional objects (comb, lotion, bal). Patient with inconsistent responses to objects, unable to track objects through visual field, unable to attend to objects, not making eye contact. Patient continuously closing eyes and turning away from stimulus. Patient handed off to OT at end of session.  Therapy Documentation Precautions:  Precautions Precautions: Fall Precaution Comments: peg with abdominal binder, LUE splint, and L LE KI.   Required Braces or Orthoses: Knee Immobilizer - Left Knee Immobilizer - Left: On at all times Other Brace/Splint: Bil PRAFOs and L UE and LE splint can be removed for ROM with Ot/PT only.   Restrictions Weight Bearing Restrictions: Yes LUE Weight Bearing: Non weight bearing LLE Weight Bearing: Non weight bearing General: Amount of Missed PT Time (min): 15 Minutes Missed Time Reason: Nursing care  See FIM for current functional status  Therapy/Group: Individual Therapy  Chipper Herb. Saed Hudlow, PT, DPT  09/26/2013, 12:32 PM

## 2013-09-26 NOTE — Progress Notes (Signed)
Social Work Patient ID: Cory [name], male   DOB: 09/07/1981, 32 y.o.   MRN: [id number]  Met this morning with pt's mother along with Mackie Pai (Therapy Supervisor) and Marissa Nestle, PA.  Reviewed current functional status and gains made since admission.  Stressed that pt remains at very low Rancho level with education on what actual activities team is witnessing to qualify this.  Mother denies having any questions.  Her aunt who is present, asks about pt's infection.  Education provided on MRSA.  Discussion of d/c plans with mother agreeable with SW beginning search for SNF. She is in agreement that she cannot provide level of care needed for pt.  FL2 updated and sent out for bed search.  Will keep team posted.  Pricila Bridge, LCSW

## 2013-09-26 NOTE — Consult Note (Signed)
WOC wound follow up Wound type: Left anterior foot with full thickness wound slowly decreasing in size. 2.5X2X.1cm surrounded by pink dry scar tissue.  Inner wound yellow with mod amt yellow drainage, no odor.   Left heel with previous blister which evolved into full thickness tissue loss.  Loose nonviable skin flap peels back and removes easily with scissors.  No pain or bleeding.  Wound bed revealed is 4X7X.1cm with bone palpable. 90% red, 10% yellow, mod amt yellow drainage, no odor. Plan:  Discontinue xeroform and apply Aquacel to anterior and heel wounds to absorb drainage and provide antimicrobial benefits. Pt is wearing heel lift boots to reduce pressure. Please re-consult if further assistance is needed.  Thank-you,  Cammie Mcgee MSN, RN, CWOCN, [place], CNS [phone]

## 2013-09-26 NOTE — Progress Notes (Signed)
Subjective/Complaints:  Limited interaction with me this am. Occasional purposeful movements with therapies Afebrile.  A  review of systems has been performed and if not noted above is otherwise negative. .  Objective: Vital Signs: Blood pressure 121/70, pulse 84, temperature 97.8 F (36.6 C), temperature source Axillary, resp. rate 18, height 5' 2.99" (1.6 m), weight 44.4 kg (97 lb 14.2 oz), SpO2 93.00%. No results found.  Recent Labs  09/23/13 1325  WBC 11.8*  HGB 9.4*  HCT 27.6*  PLT 250   No results found for this basename: NA, K, CL, CO, GLUCOSE, BUN, CREATININE, CALCIUM,  in the last 72 hours CBG (last 3)   Recent Labs  09/25/13 2009  GLUCAP 103*    Wt Readings from Last 3 Encounters:  09/26/13 44.4 kg (97 lb 14.2 oz)  09/13/13 46.811 kg (103 lb 3.2 oz)  09/13/13 46.811 kg (103 lb 3.2 oz)    Physical Exam:  Constitutional:      lethargic Eyes: Right eye exhibits no discharge. Left eye exhibits no discharge.  Neck:  Janina Mayo out , stoma closed Cardiovascular: Normal rate and regular rhythm.  Respiratory: Effort normal.  GI: Soft.  Lymphadenopathy:  He has no cervical adenopathy.  Neurological:    . Continues to reach and grab at objects on the right. . Not following commands or focally responding to me today.  Musc:  Left arm remains in splint, left leg in splint. G/J tube in place.  Skin: no drainage around G/J tube , hypertrophic granulation tissue, improving after silver nitrate   Assessment/Plan: 1. 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 Physiatrist is providing close team supervision and 24 hour management of active medical problems listed below. Physiatrist and rehab team continue to assess barriers to discharge/monitor patient progress toward functional and medical goals.  Still remains a RLAS 2-3. Will discuss with team dispo  planning, further goals, etc in team conference today FIM: FIM - Bathing Bathing: 1: Two helpers  FIM - Upper Body Dressing/Undressing Upper body dressing/undressing: 1: Two helpers FIM - Lower Body Dressing/Undressing Lower body dressing/undressing: 1: Two helpers  FIM - Toileting Toileting: 0: No continent bowel/bladder events this shift  FIM - Archivist Transfers: 0-Activity did not occur  FIM - Games developer Transfer: 1: Two helpers  FIM - Locomotion: Wheelchair Locomotion: Wheelchair: 1: Total Assistance/staff pushes wheelchair (Pt<25%) FIM - Locomotion: Ambulation Ambulation/Gait Assistance: Not tested (comment) Locomotion: Ambulation: 0: Activity did not occur  Comprehension Comprehension Mode: Auditory Comprehension: 1-Understands basic less than 25% of the time/requires cueing 75% of the time  Expression Expression Mode: Verbal Expression: 1-Expresses basis less than 25% of the time/requires cueing greater than 75% of the time.  Social Interaction Social Interaction: 1-Interacts appropriately less than 25% of the time. May be withdrawn or combative.  Problem Solving Problem Solving: 1-Solves basic less than 25% of the time - needs direction nearly all the time or does not effectively solve problems and may need a restraint for safety  Memory Memory: 1-Recognizes or recalls less than 25% of the time/requires cueing greater than 75% of the time  Medical Problem List and Plan:  1. DVT Prophylaxis/Anticoagulation: Pharmaceutical: Lovenox  2. Pain Management: Prn oxycodone for outward signs of pain.  3. Mood: Currently too low level to evaluate.  4. Neuropsych: This patient is not capable of making decisions on his own behalf.  5. Recurrent Fever: afebrile  white  count declining, recheck   -wound culture with MRSA---bactrim for 7 days  -PEG site cultured yesterday---likely mostly formula 6. Left tibial plateau Fx--s/p ORIF: NWB with   ROM only with PT. Otherwise always in splint 7. Left humerus Fx--s/p ORIF: NWB- may be out of splint with therapy 8. Leucocytosis: improving 9. Left shin and heel wound: Continue bilateral PRAFO. Pressure relief measures-- WOC consult  .air mattress overlay.   10. ABLA: hgb rechecks 11. Dysphagia: Continue TF for supplement. Continue D1, honey liquids with assistance.  12. Agitation: seroquel- Continue to use ativan prn. Vail bed for safety--still required  LOS (Days) 13 A FACE TO FACE EVALUATION WAS PERFORMED  Omauri Boeve T 09/26/2013 7:40 AM

## 2013-09-26 NOTE — Progress Notes (Signed)
Speech Language Pathology Daily Session Note  Patient Details  Name: Cory [name] MRN: [id number] Date of Birth: [birth year]  Today's Date: 09/26/2013 Time: 1115-1200 Time Calculation (min): 45 min  Short Term Goals: Week 2: SLP Short Term Goal 1 (Week 2): Pt will localize response in the right field  of enviornment with auditory stimuli with 50% of opportunities with max A multimodal cueing.  SLP Short Term Goal 2 (Week 2): Pt will focus attention to a functioanl task for 5 seconds with Max A multimodal cueing  SLP Short Term Goal 3 (Week 2): Pt will visually track a functional item with 50% of opportunities with Max A multimodal cueing   Skilled Therapeutic Interventions: Treatment focus on introducing basic stimuli for opportunities for purposeful responses. Upon arrival, pt asleep in wheelchair and required total A multimodal cueing for arousal and for eyes to remain open for ~5 seconds. Pt unable to respond to yes/no questions despite total A multimodal cueing.  Pt also demonstrated decreased response to auditory, tactile, noxious stimuli and decreased ability to initiate 1 step commands with R UE, suspect decreased cognitive function was due to fatigue. At end of session, pt demonstrated moaning with restlessness with subsequent incontinent episode of bowel and bladder.  Pt transferred back to bed with total A +2 for hygiene. Continue current plan of care.    FIM:  Comprehension Comprehension: 1-Understands basic less than 25% of the time/requires cueing 75% of the time Expression Expression: 1-Expresses basis less than 25% of the time/requires cueing greater than 75% of the time. Social Interaction Social Interaction: 1-Interacts appropriately less than 25% of the time. May be withdrawn or combative. Problem Solving Problem Solving: 1-Solves basic less than 25% of the time - needs direction nearly all the time or does not effectively solve problems and may need a restraint for  safety Memory Memory: 1-Recognizes or recalls less than 25% of the time/requires cueing greater than 75% of the time  Pain No s/s of pain  Therapy/Group: Individual Therapy  Carole Deere 09/26/2013, 4:11 PM

## 2013-09-27 ENCOUNTER — Inpatient Hospital Stay (HOSPITAL_COMMUNITY): Payer: Medicaid - Out of State | Admitting: Occupational Therapy

## 2013-09-27 ENCOUNTER — Inpatient Hospital Stay (HOSPITAL_COMMUNITY): Payer: Medicaid Other | Admitting: Speech Pathology

## 2013-09-27 ENCOUNTER — Inpatient Hospital Stay (HOSPITAL_COMMUNITY): Payer: Medicaid Other | Admitting: *Deleted

## 2013-09-27 LAB — CREATININE, SERUM: GFR calc non Af Amer: >90 mL/min (ref >90)

## 2013-09-27 NOTE — Progress Notes (Signed)
Physical Therapy Session Note  Patient Details  Name: Cory [name] MRN: [id number] Date of Birth: [birth year]  Today's Date: 09/27/2013 Time: 9629-5284 and 1324-4010 Time Calculation (min): 60 min and 40 min  Short Term Goals: Week 2:  PT Short Term Goal 1 (Week 2): Patient will demonstrate a localized response in right field with audiotory stimulus 75% of the time with max cuing PT Short Term Goal 2 (Week 2): Pt will demonstrate focused attention for 3 seconds with max cuing for in preparation for bed<>wheelchair transfer. PT Short Term Goal 3 (Week 2): Pt will visually track a functional object 50% of opportunities with max cuing  Skilled Therapeutic Interventions/Progress Updates:  AM Session: Session focused on introducing functional stimuli for opportunities to ellicit purposeful responses. Patient changed after incontinent episode of bowel. Patient positioned in wheelchair with H strap for safety to decrease restless movements. Focused on following simple commands related to self care including: washing face/wiping mouth, noted biting reflex with each trial of bringing wash cloth to face/mouth; brushing hair with HOH assist; grasp and release of right hand. Patient not visually tracking functional objects this session, responds to name calling approx 50% of trials.  PM Session: Patient received sitting in wheelchair, from SLP. Session focused on increasing patient tolerance to PROM B LE. Patient with consistent response to apparent noxious stimulation when PROM for R and L hip/knee flexion, noted facial grimacing and increased vocalizations, rolling away from stimulus. Patient with strong extensor tone in B LE, difficult to break through to flexion, however able to break reflexive pattern when positioned in sidelying with increased hip fleixion. Patient grimaces with this motion, and localizes to each knee when passively stretched. Patient left supine in bed with RN and rehab tech for RN to  administer tube feeding. Alerted RN to difficulty zipping one side of enclosure bed, RN verbalizes she will f/u.  Therapy Documentation Precautions:  Precautions Precautions: Fall Precaution Comments: peg with abdominal binder, LUE splint, and L LE KI.   Required Braces or Orthoses: Knee Immobilizer - Left Knee Immobilizer - Left: On at all times Other Brace/Splint: Bil PRAFOs and L UE and LE splint can be removed for ROM with Ot/PT only.   Restrictions Weight Bearing Restrictions: Yes LUE Weight Bearing: Non weight bearing LLE Weight Bearing: Non weight bearing Locomotion : Ambulation Ambulation/Gait Assistance: Not tested (comment)   See FIM for current functional status  Therapy/Group: Individual Therapy  Chipper Herb. Makayah Pauli, PT, DPT 09/27/2013, 11:47 AM

## 2013-09-27 NOTE — Progress Notes (Signed)
Subjective/Complaints:  Made eye contact with me today. No verbal output. Quickly distracted A  review of systems has been performed and if not noted above is otherwise negative. .  Objective: Vital Signs: Blood pressure 107/73, pulse 104, temperature 97 F (36.1 C), temperature source Oral, resp. rate 16, height 5' 2.99" (1.6 m), weight 44.4 kg (97 lb 14.2 oz), SpO2 97.00%. No results found. No results found for this basename: WBC, HGB, HCT, PLT,  in the last 72 hours  Recent Labs  09/27/13 0540  CREATININE 0.47*   CBG (last 3)   Recent Labs  09/25/13 2009  GLUCAP 103*    Wt Readings from Last 3 Encounters:  09/26/13 44.4 kg (97 lb 14.2 oz)  09/13/13 46.811 kg (103 lb 3.2 oz)  09/13/13 46.811 kg (103 lb 3.2 oz)    Physical Exam:  Constitutional:      lethargic Eyes: Right eye exhibits no discharge. Left eye exhibits no discharge.  Neck:  Janina Mayo out , stoma closed Cardiovascular: Normal rate and regular rhythm.  Respiratory: Effort normal.  GI: Soft.  Lymphadenopathy:  He has no cervical adenopathy.  Neurological:    . Continues to reach and grab at objects on the right. . Not following commands or focally responding to me today.  Musc:  Left arm remains in splint, left leg in splint. G/J tube in place.  Skin: no drainage around G/J tube , hypertrophic granulation tissue, improving after silver nitrate   Assessment/Plan: 1. 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 Physiatrist is providing close team supervision and 24 hour management of active medical problems listed below. Physiatrist and rehab team continue to assess barriers to discharge/monitor patient progress toward functional and medical goals.  Still remains a RLAS 2-3. Will discuss with team dispo planning, further goals, etc in team conference today FIM: FIM - Bathing Bathing: 1:  Two helpers  FIM - Upper Body Dressing/Undressing Upper body dressing/undressing: 1: Two helpers FIM - Lower Body Dressing/Undressing Lower body dressing/undressing: 1: Two helpers  FIM - Toileting Toileting: 0: No continent bowel/bladder events this shift  FIM - Archivist Transfers: 0-Activity did not occur  FIM - Games developer Transfer: 1: Two helpers  FIM - Locomotion: Wheelchair Locomotion: Wheelchair: 1: Total Assistance/staff pushes wheelchair (Pt<25%) FIM - Locomotion: Ambulation Ambulation/Gait Assistance: Not tested (comment) Locomotion: Ambulation: 0: Activity did not occur  Comprehension Comprehension Mode: Auditory Comprehension: 1-Understands basic less than 25% of the time/requires cueing 75% of the time  Expression Expression Mode: Verbal Expression: 1-Expresses basis less than 25% of the time/requires cueing greater than 75% of the time.  Social Interaction Social Interaction: 1-Interacts appropriately less than 25% of the time. May be withdrawn or combative.  Problem Solving Problem Solving: 1-Solves basic less than 25% of the time - needs direction nearly all the time or does not effectively solve problems and may need a restraint for safety  Memory Memory: 1-Recognizes or recalls less than 25% of the time/requires cueing greater than 75% of the time  Medical Problem List and Plan:  1. DVT Prophylaxis/Anticoagulation: Pharmaceutical: Lovenox  2. Pain Management: Prn oxycodone for outward signs of pain.  3. Mood: Currently too low level to evaluate.  4. Neuropsych: This patient is not capable of making decisions on his own behalf.  5. Recurrent Fever: afebrile  white count declining, recheck   -wound culture with MRSA---bactrim for 7 days  -PEG site  cultured yesterday---likely mostly formula. Already on abx 6. Left tibial plateau Fx--s/p ORIF: NWB with  ROM only with PT. Otherwise always in splint 7. Left humerus Fx--s/p  ORIF: NWB- may be out of splint with therapy  -HO and  Likely MO surrounding fracture and injury site---Range as tolerated 8. Leucocytosis: improving 9. Left shin and heel wound: Continue bilateral PRAFO. Pressure relief measures-- WOC assistance appreciated 10. ABLA: hgb rechecks 11. Dysphagia: Continue TF for supplement. Continue D1, honey liquids with assistance.  12. Agitation: seroquel- Continue to use ativan prn. Vail bed for safety--still required  LOS (Days) 14 A FACE TO FACE EVALUATION WAS PERFORMED  SWARTZ,ZACHARY T 09/27/2013 8:24 AM

## 2013-09-27 NOTE — Progress Notes (Signed)
Speech Language Pathology Daily Session Note  Patient Details  Name: Cory [name] MRN: [id number] Date of Birth: [birth year]  Today's Date: 09/27/2013 Time: 1330-1415 Time Calculation (min): 45 min  Short Term Goals: Week 2: SLP Short Term Goal 1 (Week 2): Pt will localize response in the right field  of enviornment with auditory stimuli with 50% of opportunities with max A multimodal cueing.  SLP Short Term Goal 2 (Week 2): Pt will focus attention to a functioanl task for 5 seconds with Max A multimodal cueing  SLP Short Term Goal 3 (Week 2): Pt will visually track a functional item with 50% of opportunities with Max A multimodal cueing   Skilled Therapeutic Interventions: Treatment focus on introducing basic stimuli for opportunities for purposeful responses. Upon arrival, pt awake in wheelchair. SLP facilitated session by providing auditory and tactile stimuli in left field of environment. Pt made eye contact with clinician with 75% of opportunities, however, responded to yes/no questions with head nods with 25% of opportunities and pt answered all questions "no." Pt initiated self-feeding Dys. 1 textures and honey-thick liquids via cup at midline and required assistance with management of spoon and cup. Pt also sustained attention to task for ~50% of magic cup. Pt with intermittent throat clearing throughout meal. At end of session, pt had increased restlessness, RN made aware and medications were administered.    FIM:  Comprehension Comprehension Mode: Auditory Comprehension: 1-Understands basic less than 25% of the time/requires cueing 75% of the time Expression Expression Mode: Verbal Expression: 1-Expresses basis less than 25% of the time/requires cueing greater than 75% of the time. Social Interaction Social Interaction: 1-Interacts appropriately less than 25% of the time. May be withdrawn or combative. Problem Solving Problem Solving: 1-Solves basic less than 25% of the time -  needs direction nearly all the time or does not effectively solve problems and may need a restraint for safety Memory Memory: 1-Recognizes or recalls less than 25% of the time/requires cueing greater than 75% of the time FIM - Eating Eating Activity: 1: Helper performs IV, parenteral, or tube feeding  Pain Pain Assessment Pain Assessment: PAINAD (shaking rt leg) Faces Pain Scale: Hurts even more Pain Intervention(s): Medication (See eMAR)  Therapy/Group: Individual Therapy  Ching Rabideau 09/27/2013, 4:41 PM

## 2013-09-27 NOTE — Plan of Care (Signed)
Problem: RH BOWEL ELIMINATION Goal: RH STG MANAGE BOWEL W/MEDICATION W/ASSISTANCE STG Manage Bowel with Medication with min Assistance.  Outcome: Not Progressing Patient unable to assist with medication administration  Goal: RH STG MANAGE BOWEL W/EQUIPMENT W/ASSISTANCE STG Manage Bowel With Equipment With max Assistance  Outcome: Not Progressing Incontinent not using equipment   Problem: RH BLADDER ELIMINATION Goal: RH STG MANAGE BLADDER WITH EQUIPMENT WITH ASSISTANCE STG Manage Bladder With Equipment With max Assistance  Outcome: Not Progressing Not using equipment , incontinent   Problem: RH SKIN INTEGRITY Goal: RH STG MAINTAIN SKIN INTEGRITY WITH ASSISTANCE STG Maintain Skin Integrity With max Assistance.  Outcome: Not Progressing Total assist  Goal: RH STG ABLE TO PERFORM INCISION/WOUND CARE W/ASSISTANCE STG Able To Perform Incision/Wound Care With max Assistance.  Outcome: Progressing Total assist   Problem: RH SAFETY Goal: RH STG ADHERE TO SAFETY PRECAUTIONS W/ASSISTANCE/DEVICE STG Adhere to Safety Precautions With max Assistance/Device.  Outcome: Progressing Total assist using enclosure bed   Problem: RH PAIN MANAGEMENT Goal: RH STG PAIN MANAGED AT OR BELOW PT'S PAIN GOAL Patient will verbalize pain level and medicate as needed  Outcome: Not Progressing Nonverbal

## 2013-09-27 NOTE — Progress Notes (Signed)
Occupational Therapy Session Note  Patient Details  Name: Cory [name] MRN: [id number] Date of Birth: Mar 28, 1981  Today's Date: 09/27/2013 Time: 1115-1200 Time Calculation (min): 45 min  Short Term Goals: Week 2:  OT Short Term Goal 1 (Week 2): Pt will demonstraion a localized response in right field with audiotory stimulus 75% of the time with max cuing OT Short Term Goal 2 (Week 2): Pt will demonstrate focused attention for 3 seconds with max cuing for functional task OT Short Term Goal 3 (Week 2): Pt will visually track an object 50% of opportunities with max cuing of  a  functional object  Skilled Therapeutic Interventions/Progress Updates:    1:1 Pt asleep in vail bed when arrived. Pt alerted to name being called with eyes slightly opened and eyebrows raised. As therapist was alerting pt; pt became incontinent of heavy urine output and bowel. Attempted to provided opportunities for following one step motor commands of rolling in bed. Pt with strong reflexive posture and rigid. Due to patient continuing to void - required 3 ppl to assist with clean up and hygiene. One transitioned into the w/c pt fell back asleep.   Therapy Documentation Precautions:  Precautions Precautions: Fall Precaution Comments: peg with abdominal binder, LUE splint, and L LE KI.   Required Braces or Orthoses: Knee Immobilizer - Left Knee Immobilizer - Left: On at all times Other Brace/Splint: Bil PRAFOs and L UE and LE splint can be removed for ROM with Ot/PT only.   Restrictions Weight Bearing Restrictions: Yes LUE Weight Bearing: Non weight bearing LLE Weight Bearing: Non weight bearing Pain: No s/s of pain  See FIM for current functional status  Therapy/Group: Individual Therapy  [name] [facility] 09/27/2013, 4:03 PM

## 2013-09-28 ENCOUNTER — Inpatient Hospital Stay (HOSPITAL_COMMUNITY): Payer: Medicaid Other | Admitting: *Deleted

## 2013-09-28 ENCOUNTER — Inpatient Hospital Stay (HOSPITAL_COMMUNITY): Payer: Medicaid - Out of State | Admitting: Occupational Therapy

## 2013-09-28 ENCOUNTER — Inpatient Hospital Stay (HOSPITAL_COMMUNITY): Payer: Medicaid Other | Admitting: Speech Pathology

## 2013-09-28 LAB — WOUND CULTURE
Culture: NO GROWTH
Gram Stain: NONE SEEN

## 2013-09-28 NOTE — Progress Notes (Signed)
Physical Therapy Session Note  Patient Details  Name: Cory [name] MRN: [id number] Date of Birth: 1981-02-19  Today's Date: 09/28/2013 Time: 0830 ([place] with PT (CK) 0830-0930)-0900 and 1400-1435 Time Calculation (min): 30 min and 35 min  Short Term Goals: Week 2:  PT Short Term Goal 1 (Week 2): Patient will demonstrate a localized response in right field with audiotory stimulus 75% of the time with max cuing PT Short Term Goal 2 (Week 2): Pt will demonstrate focused attention for 3 seconds with max cuing for in preparation for bed<>wheelchair transfer. PT Short Term Goal 3 (Week 2): Pt will visually track a functional object 50% of opportunities with max cuing  Skilled Therapeutic Interventions/Progress Updates:    AM Session: Patient received supine in bed, increased restlessness during this session with noted decreased periods of stillness between reflexive flexion/extension movements and rolling. Patient alerted to name being called, turns head towards therapist and makes eye contact. Patient incontinent of bowel and bladder, requires +2 for hygiene and donning of new brief. Patient provided with opportunities to follow simple, one step commands during rolling, but patient unable to follow commands secondary to very restless behavior. Additionally, apparent pain with perineal hygiene with noted grimacing and rolling away from therapist. Patient appearing to be less tolerable of hands on stimulation from therapists today, localizing to touch on various parts of body, using B UEs to attempt to remove. Seated edge of bed, requires +2 total A to maintain sitting balance and patient continues to demonstrate constant and rapid flexion/extension of trunk and B LEs, increased restlessness in sitting as compared to previous sessions. Opportunities for patient to engage in functional self-care tasks: washing face, wiping mouth, removing washcloth from face, sitting edge of bed and supine with HOB  elevated secondary to decreased safety sitting edge of bed. Patient demonstrating strong biting reflex today towards any/all objects placed in visual field. Patient with inconsistent responses to all stimuli, appearing over-stimulated and unable to follow commands or participate effectively. Patient does respond to name being called throughout session from B sides approx 75% of the time. Patient left supine in enclosure bed.  PM Session: Patient received supine in bed. Session focused on increasing patient tolerance to PROM B LE. Patient continues with consistent response to apparent noxious stimulation when PROM for R and L hip/knee flexion, noted facial grimacing and increased vocalizations, rolling away from stimulus. Patient continues with strong extensor tone in B LE during flexion PROM, difficult to break through to flexion, however patient able to tolerate with prolonged passive stretching in flexion. Patient grimaces and localizes to each knee when passively stretched, using B UEs to attempt to remove therapist's hands. Attempted to engage patient in opportunities to follow simple commands, but patient demonstrating prolonged periods of eyes closed and not interacting with therapist. Patient left supine in enclosure bed.  Therapy Documentation Precautions:  Precautions Precautions: Fall Precaution Comments: peg with abdominal binder, LUE splint, and L LE KI.   Required Braces or Orthoses: Knee Immobilizer - Left Knee Immobilizer - Left: On at all times Other Brace/Splint: Bil PRAFOs and L UE and LE splint can be removed for ROM with Ot/PT only.   Restrictions Weight Bearing Restrictions: Yes LUE Weight Bearing: Non weight bearing LLE Weight Bearing: Non weight bearing Locomotion : Ambulation Ambulation/Gait Assistance: Not tested (comment)   See FIM for current functional status  Therapy/Group: Co-Treatment with PT in AM Session  [employer]SC[employer] S. Jeiden Daughtridge, PT, DPT  09/28/2013,  12:19 PM

## 2013-09-28 NOTE — Progress Notes (Signed)
Occupational Therapy Session Note  Patient Details  Name: Cory [name] MRN: [id number] Date of Birth: Dec 06, 1980  Today's Date: 09/28/2013 Time: 1015-1100 Time Calculation (min): 45 min  Short Term Goals: Week 2:  OT Short Term Goal 1 (Week 2): Pt will demonstraion a localized response in right field with audiotory stimulus 75% of the time with max cuing OT Short Term Goal 2 (Week 2): Pt will demonstrate focused attention for 3 seconds with max cuing for functional task OT Short Term Goal 3 (Week 2): Pt will visually track an object 50% of opportunities with max cuing of  a  functional object  Skilled Therapeutic Interventions/Progress Updates:    1:1 Neuro muscular reed- doffed left UE splint to provide ROM to shoulder, elbow and wrist, skin care (bathing with almond exact and applying lotion). Pt tolerated it moderately but at times would pull his left UE into his chest and rub with his right UE. Pt appears to be hypersensitive in the bicep/ triceps area. Pt did participate in rubbing lotion on his left UE with right 2/6 times. Pt with full elbow extension and shoulder to 100 degrees before resisting to put his arm down.   Therapy Documentation Precautions:  Precautions Precautions: Fall Precaution Comments: peg with abdominal binder, LUE splint, and L LE KI.   Required Braces or Orthoses: Knee Immobilizer - Left Knee Immobilizer - Left: On at all times Other Brace/Splint: Bil PRAFOs and L UE and LE splint can be removed for ROM with Ot/PT only.   Restrictions Weight Bearing Restrictions: Yes LUE Weight Bearing: Non weight bearing LLE Weight Bearing: Non weight bearing Pain: Pain Assessment Pain Assessment: PAINAD PAINAD (Pain Assessment in Advanced Dementia) Breathing: normal Negative Vocalization: none Facial Expression: smiling or inexpressive Body Language: relaxed Consolability: no need to console PAINAD Score: 0  See FIM for current functional  status  Therapy/Group: Individual Therapy  [name] [facility] 09/28/2013, 1:55 PM

## 2013-09-28 NOTE — Progress Notes (Signed)
Speech Language Pathology Weekly Progress Note  Patient Details  Name: Cory [name] MRN: [id number] Date of Birth: 09/20/1981  Today's Date: 09/28/2013  Short Term Goals: Week 2: SLP Short Term Goal 1 (Week 2): Pt will localize response in the right field  of enviornment with auditory stimuli with 50% of opportunities with max A multimodal cueing.  SLP Short Term Goal 1 - Progress (Week 2): Not met SLP Short Term Goal 2 (Week 2): Pt will focus attention to a functioanl task for 5 seconds with Max A multimodal cueing  SLP Short Term Goal 2 - Progress (Week 2): Met SLP Short Term Goal 3 (Week 2): Pt will visually track a functional item with 50% of opportunities with Max A multimodal cueing  SLP Short Term Goal 3 - Progress (Week 2): Met  New Short Term Goals:  Week 3: SLP Short Term Goal 1 (Week 3): Pt will localize response in the right field  of enviornment with auditory stimuli with 50% of opportunities with max A multimodal cueing.  SLP Short Term Goal 2 (Week 3): Pt will visually track a functional item with 75% of opportunities with Max A multimodal cueing during a self-feeding task. SLP Short Term Goal 3 (Week 3): Pt will focus attention to a functioanl task for 30 seconds with Max A multimodal cueing during self-feeding task   Weekly Progress Updates: Pt has made small, inconsistent gains this reporting period and has met 2 of 3 STG's this reporting period due to improvements in visual tracking and focused attention. 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  Pt continues to demonstrate decreased ability to localize responses in his right field of environment and consistently answers yes/no questions with a head nod "no." Pt is also  currently consuming Dys. 1 textures with honey-thick liquids via tsp without overt s/s of aspiration with increased ability to self-feed via spoon and cup. Pt would benefit from continued skilled SLP intervention to maximize cognitive and swallowing.    SLP Intensity: Minumum of 1-2 x/day, 30 to 90 minutes SLP Frequency: 5 out of 7 days SLP Duration/Estimated Length of Stay: TBD due to SNF placement  SLP Treatment/Interventions: Cueing hierarchy;Cognitive remediation/compensation;Environmental controls;Internal/external aids;Therapeutic Exercise;Speech/Language facilitation;Therapeutic Activities;Patient/family education;Dysphagia/aspiration precaution training;Functional tasks    Rhyder Koegel 09/28/2013, 7:24 AM

## 2013-09-28 NOTE — Progress Notes (Signed)
Physical Therapy Session Note  Patient Details  Name: Zigmund Linse MRN: [id number] Date of Birth: 02-11-1981  Today's Date: 09/28/2013 Time: 0900-0930 Time Calculation (min): 30 min   Skilled Therapeutic Interventions/Progress Updates:  Co-tx with PT this session. Focus this session on hygiene, following simple one-step commands and opportunities to engage in self-care while sitting EOB or semi-reclined in bed. See tx session note by Perlie Mayo, PT, DPT for detailed information.    See FIM for current functional status  Denzil Hughes 09/28/2013, 4:13 PM

## 2013-09-28 NOTE — Progress Notes (Signed)
Speech Language Pathology Daily Session Note  Patient Details  Name: Cory [name] MRN: [id number] Date of Birth: 12-Jun-1981  Today's Date: 09/28/2013 Time: 4098-1191 Time Calculation (min): 45 min  Short Term Goals: Week 2: SLP Short Term Goal 1 (Week 2): Pt will localize response in the right field  of enviornment with auditory stimuli with 50% of opportunities with max A multimodal cueing.  SLP Short Term Goal 1 - Progress (Week 2): Not met SLP Short Term Goal 2 (Week 2): Pt will focus attention to a functioanl task for 5 seconds with Max A multimodal cueing  SLP Short Term Goal 2 - Progress (Week 2): Met SLP Short Term Goal 3 (Week 2): Pt will visually track a functional item with 50% of opportunities with Max A multimodal cueing  SLP Short Term Goal 3 - Progress (Week 2): Met  Skilled Therapeutic Interventions: Treatment focus on introducing basic stimuli for opportunities for purposeful responses. Upon arrival, pt awake in wheelchair. SLP facilitated session by providing auditory and tactile stimuli in left field of environment. Pt made eye contact with clinician with 75% of opportunities, however, responded to yes/no questions with head nods with 0% of opportunities. Pt initiated self-feeding of Dys. 1 textures and honey-thick liquids via cup at midline and required assistance with management of spoon and cup. Pt also sustained attention to task for 4 oz of honey-thick liquids and 50% of magic cup. Pt with intermittent throat clearing throughout meal with utilization of multiple swallows. At end of session, pt had increased restlessness and pt transferred to bed for hygiene due to incontinent episode of bowel and bladder. Continue plan of care.    FIM:  Comprehension Comprehension Mode: Auditory Comprehension: 1-Understands basic less than 25% of the time/requires cueing 75% of the time Expression Expression Mode: Nonverbal Expression: 1-Expresses basis less than 25% of the  time/requires cueing greater than 75% of the time. Social Interaction Social Interaction: 1-Interacts appropriately less than 25% of the time. May be withdrawn or combative. Problem Solving Problem Solving: 1-Solves basic less than 25% of the time - needs direction nearly all the time or does not effectively solve problems and may need a restraint for safety Memory Memory: 1-Recognizes or recalls less than 25% of the time/requires cueing greater than 75% of the time FIM - Eating Eating Activity: 4: Help with managing cup/glass;4: Help with picking up utensils;3: Helper scoops food on utensil every scoop  Pain No s/s of pain   Therapy/Group: Individual Therapy  Alera Quevedo 09/28/2013, 3:59 PM

## 2013-09-28 NOTE — Progress Notes (Signed)
Subjective/Complaints:  No changes A  review of systems has been performed and if not noted above is otherwise negative. .  Objective: Vital Signs: Blood pressure 106/60, pulse 104, temperature 97.2 F (36.2 C), temperature source Axillary, resp. rate 18, height 5' 2.99" (1.6 m), weight 44.4 kg (97 lb 14.2 oz), SpO2 100.00%. Dg Humerus Left  09/27/2013   CLINICAL DATA:  Fall with arm injury  EXAM: LEFT HUMERUS - 2+ VIEW  COMPARISON:  Twenty in 03 September 2013.  FINDINGS: There is evidence of ongoing healing of a comminuted fracture of the junction of middle and distal thirds of the left humerus. The metallic sideplate and compression screws appear intact. There is considerable periosteal reaction but the fracture line remains well demonstrated. The observed portions of the shoulder and elbow appear normal. A large radiodense presumed bony fragment lies in the soft tissues laterally at the fracture site and appears unchanged.  IMPRESSION: There has not been definite interval change in the appearance of the healing comminuted fracture of the distal left humerus with the fracture line still well demonstrated. The metallic hardware appears intact.   Electronically Signed   By: David  [place]   On: 09/27/2013 08:28   No results found for this basename: WBC, HGB, HCT, PLT,  in the last 72 hours  Recent Labs  09/27/13 0540  CREATININE 0.47*   CBG (last 3)   Recent Labs  09/25/13 2009  GLUCAP 103*    Wt Readings from Last 3 Encounters:  09/26/13 44.4 kg (97 lb 14.2 oz)  09/13/13 46.811 kg (103 lb 3.2 oz)  09/13/13 46.811 kg (103 lb 3.2 oz)    Physical Exam:  Constitutional:      lethargic Eyes: Right eye exhibits no discharge. Left eye exhibits no discharge.  Neck:  Janina Mayo out , stoma closed Cardiovascular: Normal rate and regular rhythm.  Respiratory: Effort normal.  GI: Soft.  Lymphadenopathy:  He has no cervical adenopathy.  Neurological:    . Continues to reach and grab at  objects on the right. . Not following commands. Will make brief eye contact with me but attention is only a few secs Musc:  Left arm remains in splint, left leg in splint. G/J tube in place.  Skin: no drainage around G/J tube , hypertrophic granulation tissue, improving after silver nitrate   Assessment/Plan: 1. 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 Physiatrist is providing close team supervision and 24 hour management of active medical problems listed below. Physiatrist and rehab team continue to assess barriers to discharge/monitor patient progress toward functional and medical goals.  Still remains a RLAS 2-3. Will discuss with team dispo planning, further goals, etc in team conference today FIM: FIM - Bathing Bathing: 1: Two helpers  FIM - Upper Body Dressing/Undressing Upper body dressing/undressing: 1: Two helpers FIM - Lower Body Dressing/Undressing Lower body dressing/undressing: 1: Two helpers  FIM - Toileting Toileting: 0: No continent bowel/bladder events this shift  FIM - Archivist Transfers: 0-Activity did not occur  FIM - Games developer Transfer: 1: Two helpers  FIM - Locomotion: Wheelchair Locomotion: Wheelchair: 1: Total Assistance/staff pushes wheelchair (Pt<25%) FIM - Locomotion: Ambulation Ambulation/Gait Assistance: Not tested (comment) Locomotion: Ambulation: 0: Activity did not occur  Comprehension Comprehension Mode: Auditory Comprehension: 1-Understands basic less than 25% of the time/requires cueing 75% of the time  Expression Expression Mode: Verbal Expression: 1-Expresses basis less than 25% of  the time/requires cueing greater than 75% of the time.  Social Interaction Social Interaction: 1-Interacts appropriately less than 25% of the time. May be withdrawn or combative.  Problem Solving Problem  Solving: 1-Solves basic less than 25% of the time - needs direction nearly all the time or does not effectively solve problems and may need a restraint for safety  Memory Memory: 1-Recognizes or recalls less than 25% of the time/requires cueing greater than 75% of the time  Medical Problem List and Plan:  1. DVT Prophylaxis/Anticoagulation: Pharmaceutical: Lovenox  2. Pain Management: Prn oxycodone for outward signs of pain.  3. Mood: Currently too low level to evaluate.  4. Neuropsych: This patient is not capable of making decisions on his own behalf.  5. Recurrent Fever: afebrile  white count declining, recheck   -wound culture with MRSA---bactrim for 7 days  -PEG site cultured negative 6. Left tibial plateau Fx--s/p ORIF: NWB with  ROM only with PT. Otherwise always in splint 7. Left humerus Fx--s/p ORIF: NWB- may be out of splint with therapy  -HO and  Likely MO surrounding fracture and injury site---Range as tolerated 8. Leucocytosis: improving 9. Left shin and heel wound: Continue bilateral PRAFO. Pressure relief measures-- WOC assistance appreciated 10. ABLA: hgb rechecks 11. Dysphagia: Continue TF for supplement. Continue D1, honey liquids with assistance.  12. Agitation: seroquel- Continue to use ativan prn. Vail bed for safety--still required  LOS (Days) 15 A FACE TO FACE EVALUATION WAS PERFORMED  SWARTZ,ZACHARY T 09/28/2013 8:26 AM

## 2013-09-29 ENCOUNTER — Inpatient Hospital Stay (HOSPITAL_COMMUNITY): Payer: Medicaid - Out of State | Admitting: Occupational Therapy

## 2013-09-29 ENCOUNTER — Inpatient Hospital Stay (HOSPITAL_COMMUNITY): Payer: Medicaid Other | Admitting: *Deleted

## 2013-09-29 ENCOUNTER — Ambulatory Visit (HOSPITAL_COMMUNITY): Payer: Medicaid - Out of State | Admitting: Occupational Therapy

## 2013-09-29 DIAGNOSIS — S069X9A Unspecified intracranial injury with loss of consciousness of unspecified duration, initial encounter: Secondary | ICD-10-CM

## 2013-09-29 DIAGNOSIS — IMO0002 Reserved for concepts with insufficient information to code with codable children: Secondary | ICD-10-CM

## 2013-09-29 MED ORDER — QUETIAPINE FUMARATE 25 MG PO TABS
25.0000 mg | ORAL_TABLET | Freq: Two times a day (BID) | ORAL | Status: DC
  Administered 2013-09-29 – 2013-10-01 (×4): 25 mg via ORAL
  Filled 2013-09-29 (×7): qty 1

## 2013-09-29 MED ORDER — AMANTADINE HCL 50 MG/5ML PO SYRP
200.0000 mg | ORAL_SOLUTION | Freq: Two times a day (BID) | ORAL | Status: DC
  Administered 2013-09-29 – 2013-10-07 (×16): 200 mg via ORAL
  Filled 2013-09-29 (×20): qty 20

## 2013-09-29 MED ORDER — AMANTADINE HCL 100 MG PO CAPS: 100.0000 mg | ORAL_CAPSULE | Freq: Two times a day (BID) | ORAL | Status: DC

## 2013-09-29 NOTE — Progress Notes (Signed)
Speech Language Pathology Daily Session Note  Patient Details  Name: Cory [name] MRN: [id number] Date of Birth: 07/14/1981  Today's Date: 09/29/2013 Time: 0815-0900 Time Calculation (min): 45 min  Short Term Goals: Week 3: SLP Short Term Goal 1 (Week 3): Pt will localize response in the right field  of enviornment with auditory stimuli with 50% of opportunities with max A multimodal cueing.  SLP Short Term Goal 2 (Week 3): Pt will visually track a functional item with 75% of opportunities with Max A multimodal cueing during a self-feeding task. SLP Short Term Goal 3 (Week 3): Pt will focus attention to a functioanl task for 30 seconds with Max A multimodal cueing during self-feeding task   Skilled Therapeutic Interventions: Treatment focus on introducing basic stimuli for opportunities for purposeful responses. Upon arrival, pt awake in wheelchair. SLP facilitated session by providing auditory and tactile stimuli in left field of environment. Pt made eye contact with clinician with 75% of opportunities, however, responded to yes/no questions with head nods with 0% of opportunities. Pt initiated self-feeding of Dys. 1 textures and honey-thick liquids via cup at midline and was able to track magic cup to both the right and left field of environment. Pt also required intermittent assistance with management of spoon and cup. Pt sustained attention to task for 4 oz of honey-thick liquids and 25% of magic cup. Pt with intermittent throat clearing throughout meal. At end of session, pt had increased restlessness but pt was calmed with a quiet environment with limited stimuli.    FIM:  Comprehension Comprehension Mode: Auditory Comprehension: 1-Understands basic less than 25% of the time/requires cueing 75% of the time Expression Expression Mode: Nonverbal Expression: 1-Expresses basis less than 25% of the time/requires cueing greater than 75% of the time. Social Interaction Social Interaction:  1-Interacts appropriately less than 25% of the time. May be withdrawn or combative. Problem Solving Problem Solving: 1-Solves basic less than 25% of the time - needs direction nearly all the time or does not effectively solve problems and may need a restraint for safety Memory Memory: 1-Recognizes or recalls less than 25% of the time/requires cueing greater than 75% of the time FIM - Eating Eating Activity: 4: Help with managing cup/glass;4: Help with picking up utensils;3: Helper scoops food on utensil every scoop  Pain No s/s of pain   Therapy/Group: Individual Therapy  Syndi Pua 09/29/2013, 12:33 PM

## 2013-09-29 NOTE — Progress Notes (Signed)
Occupational Therapy Daily and Weekly Progress Note  Patient Details  Name: Cory [name] MRN: [id number] Date of Birth: September 01, 1981  Today's Date: 09/29/2013 Time: 4098-1191 Time Calculation (min): 45 min  Patient has met 3 of 3 short term goals.  Pt has made small, inconsistent gains this reporting period and has met 3 of 3 STG's this reporting period due to improvements in visual tracking and focused attention. Currently, pt is beginning to demonstrate behaviors consistent of a Rancho Level III and demonstrates increased ability to track to midline, localize to noxious stimuli, localize a response to auditory stimuli in his left field of environment and inconsistently utilizing head nods to answer yes/no questions. Pt continues to demonstrate decreased ability to localize responses in his right field of environment and consistently answers yes/no questions with a head nod "no." Pt still requires total A +2 for all self care tasks. OT has begun to doff left UE splint and range UE in all planes and address skin integrity of left UE  Patient continues to demonstrate the following deficits:muscle weakness, decreased cardiorespiratoy endurance, impaired timing and sequencing, abnormal tone, unbalanced muscle activation, motor apraxia, decreased coordination and decreased motor planning, decreased visual perceptual skills and decreased visual motor skills, decreased midline orientation, decreased attention to right and decreased motor planning, decreased initiation, decreased attention, decreased awareness, decreased problem solving, decreased safety awareness, decreased memory and delayed processing and decreased sitting balance, decreased standing balance, decreased postural control, hemiplegia, decreased balance strategies and difficulty maintaining precautions and therefore will continue to benefit from skilled OT intervention to enhance overall performance with Reduce care partner burden.  Patient  progressing toward long term goals..  Continue plan of care.  OT Short Term Goals Week 2:  OT Short Term Goal 1 (Week 2): Pt will demonstraion a localized response in right field with audiotory stimulus 75% of the time with max cuing OT Short Term Goal 1 - Progress (Week 2): Met OT Short Term Goal 2 (Week 2): Pt will demonstrate focused attention for 3 seconds with max cuing for functional task OT Short Term Goal 2 - Progress (Week 2): Met OT Short Term Goal 3 (Week 2): Pt will visually track an object 50% of opportunities with max cuing of  a  functional object OT Short Term Goal 3 - Progress (Week 2): Met Week 3:  OT Short Term Goal 1 (Week 3): Pt will localize response in right field with audiotory stimuli with 50% of opportunities with max cuing OT Short Term Goal 2 (Week 3): Pt will demonstrate focused attention for 5 sec during a functional tasks with mod A  OT Short Term Goal 3 (Week 3): Pt will visually track 75% of opportunities with max cuing  OT Short Term Goal 4 (Week 3): Pt will bring bilateral hands together in a functional task 25% of opportunities   Skilled Therapeutic Interventions/Progress Updates:     Co treat with SLP the 2nd half of session. Focus on following one step commands, visual tracking to the left and right, bed mobility with mod tactile cues, doffed left UE splint to address ROM (AAROM/PROM) in all planes, opportunities for bilateral UE use with application of lotion on hands and UEs, oral care with total A . Pt did show signs of resistance to oral care with shaking head back and forth and closing eyes and no longer engaging visually. Splint donned back on at end of session before leaving pt with SLP.    Therapy Documentation Precautions:  Precautions Precautions:  Fall Precaution Comments: peg with abdominal binder, LUE splint, and L LE KI.   Required Braces or Orthoses: Knee Immobilizer - Left Knee Immobilizer - Left: On at all times Other Brace/Splint: Bil  PRAFOs and L UE and LE splint can be removed for ROM with Ot/PT only.   Restrictions Weight Bearing Restrictions: Yes LUE Weight Bearing: Non weight bearing LLE Weight Bearing: Non weight bearing Pain:  no signs of pain in session   See FIM for current functional status  Therapy/Group: Individual Therapy  [name] [facility] 09/29/2013, 12:48 PM

## 2013-09-29 NOTE — Progress Notes (Signed)
Physical Therapy Session Note  Patient Details  Name: Cory [name] MRN: [id number] Date of Birth: 1981-09-08  Today's Date: 09/29/2013 Time: 0920-1015 Time Calculation (min): 55 min  Short Term Goals: Week 2:  PT Short Term Goal 1 (Week 2): Patient will demonstrate a localized response in right field with audiotory stimulus 75% of the time with max cuing PT Short Term Goal 2 (Week 2): Pt will demonstrate focused attention for 3 seconds with max cuing for in preparation for bed<>wheelchair transfer. PT Short Term Goal 3 (Week 2): Pt will visually track a functional object 50% of opportunities with max cuing  Skilled Therapeutic Interventions/Progress Updates:    Patient received in wheelchair, asleep. Patient alerted to name being called, turns head towards therapist and makes eye contact.  Positioned in wheelchair with H strap for safety, session focused on opportunities for patient to engage in functional self-care tasks: washing face, wiping mouth, removing washcloth from face/neck, combing hair, looking in mirror, etc. Patient continues to demonstrate strong biting reflex today towards any/all objects placed in visual field. Patient able to attend when mirror positioned in front of him, but consistently pushes mirror away. Patient demonstrating increased bimanual integration today, switching objects between hands. Patient able to consistently follow commands to grab objects, but unable to use functionally (once he has grabbed them, he brings them to his mouth). Patient appearing irritated when hair combed, pushing therapist's hands away. Patient appearing to be less tolerable of hands on stimulation from therapists, localizing to touch on various parts of body, using B UEs to attempt to remove. Patient left supine in enclosure bed with nurse tech and rehab tech present.  Therapy Documentation Precautions:  Precautions Precautions: Fall Precaution Comments: peg with abdominal binder, LUE  splint, and L LE KI.   Required Braces or Orthoses: Knee Immobilizer - Left Knee Immobilizer - Left: On at all times Other Brace/Splint: Bil PRAFOs and L UE and LE splint can be removed for ROM with Ot/PT only.   Restrictions Weight Bearing Restrictions: Yes LUE Weight Bearing: Non weight bearing LLE Weight Bearing: Non weight bearing Locomotion : Ambulation Ambulation/Gait Assistance: Not tested (comment)   See FIM for current functional status  Therapy/Group: Individual Therapy  Chipper Herb. Sharie Amorin, PT, DPT 09/29/2013, 12:17 PM

## 2013-09-29 NOTE — Progress Notes (Signed)
Subjective/Complaints:  Minimal change. Awake but distracted this am A  review of systems has been performed and if not noted above is otherwise negative. .  Objective: Vital Signs: Blood pressure 105/62, pulse 97, temperature 99.1 F (37.3 C), temperature source Axillary, resp. rate 21, height 5' 2.99" (1.6 m), weight 44.4 kg (97 lb 14.2 oz), SpO2 92.00%. No results found. No results found for this basename: WBC, HGB, HCT, PLT,  in the last 72 hours  Recent Labs  09/27/13 0540  CREATININE 0.47*   CBG (last 3)  No results found for this basename: GLUCAP,  in the last 72 hours  Wt Readings from Last 3 Encounters:  09/26/13 44.4 kg (97 lb 14.2 oz)  09/13/13 46.811 kg (103 lb 3.2 oz)  09/13/13 46.811 kg (103 lb 3.2 oz)    Physical Exam:  Constitutional:      lethargic Eyes: Right eye exhibits no discharge. Left eye exhibits no discharge.  Neck:  Janina Mayo out , stoma closed Cardiovascular: Normal rate and regular rhythm.  Respiratory: Effort normal.  GI: Soft.  Lymphadenopathy:  He has no cervical adenopathy.  Neurological:    . Continues to reach and grab at objects on the right. . Not following commands. Will make brief eye contact with me but attention is only a few secs Musc:  Left arm remains in splint, left leg in splint. G/J tube in place.  Skin: no drainage around G/J tube , hypertrophic granulation tissue, improving after silver nitrate   Assessment/Plan: 1. 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 Physiatrist is providing close team supervision and 24 hour management of active medical problems listed below. Physiatrist and rehab team continue to assess barriers to discharge/monitor patient progress toward functional and medical goals.  Still  a RLAS 2-3.  FIM: FIM - Bathing Bathing: 1: Two helpers  FIM - Upper Body Dressing/Undressing Upper  body dressing/undressing: 1: Two helpers FIM - Lower Body Dressing/Undressing Lower body dressing/undressing: 1: Two helpers  FIM - Toileting Toileting: 0: No continent bowel/bladder events this shift  FIM - Archivist Transfers: 0-Activity did not occur  FIM - Games developer Transfer: 1: Two helpers  FIM - Locomotion: Wheelchair Locomotion: Wheelchair: 0: Activity did not occur FIM - Locomotion: Ambulation Ambulation/Gait Assistance: Not tested (comment) Locomotion: Ambulation: 0: Activity did not occur  Comprehension Comprehension Mode: Auditory Comprehension: 1-Understands basic less than 25% of the time/requires cueing 75% of the time  Expression Expression Mode: Nonverbal Expression: 1-Expresses basis less than 25% of the time/requires cueing greater than 75% of the time.  Social Interaction Social Interaction: 1-Interacts appropriately less than 25% of the time. May be withdrawn or combative.  Problem Solving Problem Solving: 1-Solves basic less than 25% of the time - needs direction nearly all the time or does not effectively solve problems and may need a restraint for safety  Memory Memory: 1-Recognizes or recalls less than 25% of the time/requires cueing greater than 75% of the time  Medical Problem List and Plan:  1. DVT Prophylaxis/Anticoagulation: Pharmaceutical: Lovenox  2. Pain Management: Prn oxycodone for outward signs of pain.  3. Mood: Currently too low level to evaluate.  4. Neuropsych: This patient is not capable of making decisions on his own behalf.   -increase amantadine 5. Recurrent Fever: afebrile  white count declining, recheck   -wound culture with MRSA---bactrim for 7 days  -PEG site cultured negative 6. Left tibial  plateau Fx--s/p ORIF: NWB with  ROM only with PT. Otherwise always in splint 7. Left humerus Fx--s/p ORIF: NWB- may be out of splint with therapy  -HO and  Likely MO surrounding fracture and injury  site---Range as tolerated 8. Leucocytosis: improving 9. Left shin and heel wound: Continue bilateral PRAFO. Pressure relief measures-- WOC assistance appreciated 10. ABLA: hgb rechecks 11. Dysphagia: Continue TF for supplement. Continue D1, honey liquids with assistance.  12. Agitation: seroquel- decrease   Continue to use ativan prn. Vail bed for safety--still required  LOS (Days) 16 A FACE TO FACE EVALUATION WAS PERFORMED  Herminia Warren T 09/29/2013 8:32 AM

## 2013-09-29 NOTE — Progress Notes (Addendum)
Occupational Therapy Session Note  Patient Details  Name: Cory [name] MRN: [id number] Date of Birth: 1981-03-11  Today's Date: 09/29/2013 Time: 1300-1350 Time Calculation (min): 50 min  Short Term Goals: Week 3:  OT Short Term Goal 1 (Week 3): Pt will localize response in right field with audiotory stimuli with 50% of opportunities with max cuing OT Short Term Goal 2 (Week 3): Pt will demonstrate focused attention for 5 sec during a functional tasks with mod A  OT Short Term Goal 3 (Week 3): Pt will visually track 75% of opportunities with max cuing  OT Short Term Goal 4 (Week 3): Pt will bring bilateral hands together in a functional task 25% of opportunities   Skilled Therapeutic Interventions/Progress Updates:    1:1 Pt in bed restless and incontinent of bowel (RN made aware). Total A +2 for hygiene; pt resistant to pericare with reflexive rolling patterns. Pt actively pushing therapist's hands away during care. Pt transitioned into w/c with H strap applied. Engaged in self feeding addressing pt only from right side- pt able to track and self feed out of magic cup container with min A (at times to orient spoon). Pt 3/3 times put washcloth back on the table after attempting wiping mouth (1/4 times wiped without biting). Pt became resistant to tactile cues to perform functional task and closing his eye. Attempted to follow one steps commands to take object off table but no response outside of eating.   Therapy Documentation Precautions:  Precautions Precautions: Fall Precaution Comments: peg with abdominal binder, LUE splint, and L LE KI.   Required Braces or Orthoses: Knee Immobilizer - Left Knee Immobilizer - Left: On at all times Other Brace/Splint: Bil PRAFOs and L UE and LE splint can be removed for ROM with Ot/PT only.   Restrictions Weight Bearing Restrictions: Yes LUE Weight Bearing: Non weight bearing LLE Weight Bearing: Non weight bearing Pain:  no indications of pain -  missed 10 min due to fatigue.  See FIM for current functional status  Therapy/Group: Individual Therapy  [name] [facility] 09/29/2013, 2:05 PM

## 2013-09-30 NOTE — Progress Notes (Signed)
Subjective/Complaints:  Slow changes. Perhaps attending a little more with therapists A  review of systems has been performed and if not noted above is otherwise negative. .  Objective: Vital Signs: Blood pressure 103/63, pulse 98, temperature 97.8 F (36.6 C), temperature source Axillary, resp. rate 18, height 5' 2.99" (1.6 m), weight 47.083 kg (103 lb 12.8 oz), SpO2 100.00%. No results found. No results found for this basename: WBC, HGB, HCT, PLT,  in the last 72 hours No results found for this basename: NA, K, CL, CO, GLUCOSE, BUN, CREATININE, CALCIUM,  in the last 72 hours CBG (last 3)  No results found for this basename: GLUCAP,  in the last 72 hours  Wt Readings from Last 3 Encounters:  09/29/13 47.083 kg (103 lb 12.8 oz)  09/13/13 46.811 kg (103 lb 3.2 oz)  09/13/13 46.811 kg (103 lb 3.2 oz)    Physical Exam:  Constitutional:      lethargic Eyes: Right eye exhibits no discharge. Left eye exhibits no discharge.  Neck:  Janina Mayo out , stoma closed Cardiovascular: Normal rate and regular rhythm.  Respiratory: Effort normal.  GI: Soft.  Lymphadenopathy:  He has no cervical adenopathy.  Neurological:    . Continues to reach and grab at objects on the right. . Not following commands. Will make brief eye contact with me but attention is only a few secs Musc:  Left arm remains in splint, left leg in splint. G/J tube in place.  Skin: no drainage around G/J tube , hypertrophic granulation tissue, improving after silver nitrate   Assessment/Plan: 1. 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 Physiatrist is providing close team supervision and 24 hour management of active medical problems listed below. Physiatrist and rehab team continue to assess barriers to discharge/monitor patient progress toward functional and medical goals.  Still  a RLAS 2-3.  FIM: FIM -  Bathing Bathing: 1: Two helpers  FIM - Upper Body Dressing/Undressing Upper body dressing/undressing: 1: Two helpers FIM - Lower Body Dressing/Undressing Lower body dressing/undressing: 1: Two helpers  FIM - Toileting Toileting: 0: No continent bowel/bladder events this shift  FIM - Archivist Transfers: 0-Activity did not occur  FIM - Games developer Transfer: 1: Two helpers  FIM - Locomotion: Wheelchair Locomotion: Wheelchair: 1: Total Assistance/staff pushes wheelchair (Pt<25%) FIM - Locomotion: Ambulation Ambulation/Gait Assistance: Not tested (comment) Locomotion: Ambulation: 0: Activity did not occur  Comprehension Comprehension Mode: Auditory Comprehension: 1-Understands basic less than 25% of the time/requires cueing 75% of the time  Expression Expression Mode: Nonverbal Expression: 1-Expresses basis less than 25% of the time/requires cueing greater than 75% of the time.  Social Interaction Social Interaction: 1-Interacts appropriately less than 25% of the time. May be withdrawn or combative.  Problem Solving Problem Solving: 1-Solves basic less than 25% of the time - needs direction nearly all the time or does not effectively solve problems and may need a restraint for safety  Memory Memory: 1-Recognizes or recalls less than 25% of the time/requires cueing greater than 75% of the time  Medical Problem List and Plan:  1. DVT Prophylaxis/Anticoagulation: Pharmaceutical: Lovenox  2. Pain Management: Prn oxycodone for outward signs of pain.  3. Mood: Currently too low level to evaluate.  4. Neuropsych: This patient is not capable of making decisions on his own behalf.   -increase amantadine 5. Recurrent Fever: afebrile  white count declining, recheck   -wound culture with  MRSA---bactrim for 7 days  -PEG site cultured negative 6. Left tibial plateau Fx--s/p ORIF: NWB with  ROM only with PT. Otherwise always in splint 7. Left humerus  Fx--s/p ORIF: NWB- may be out of splint with therapy  -HO and  Likely MO surrounding fracture and injury site---Range as tolerated 8. Leucocytosis: improving 9. Left shin and heel wound: Continue bilateral PRAFO. Pressure relief measures-- WOC assistance appreciated 10. ABLA: hgb rechecks 11. Dysphagia: Continue TF for supplement. Continue D1, honey liquids with assistance.  12. Agitation: seroquel- decrease   Continue to use ativan prn. Vail bed for safety--still required  LOS (Days) 17 A FACE TO FACE EVALUATION WAS PERFORMED  SWARTZ,ZACHARY T 09/30/2013 8:51 AM

## 2013-10-01 ENCOUNTER — Inpatient Hospital Stay (HOSPITAL_COMMUNITY): Payer: Medicaid Other | Admitting: Occupational Therapy

## 2013-10-01 ENCOUNTER — Inpatient Hospital Stay (HOSPITAL_COMMUNITY): Payer: Medicaid Other | Admitting: *Deleted

## 2013-10-01 MED ORDER — QUETIAPINE FUMARATE 25 MG PO TABS
25.0000 mg | ORAL_TABLET | Freq: Every day | ORAL | Status: DC
  Administered 2013-10-02 – 2013-11-07 (×37): 25 mg via ORAL
  Filled 2013-10-01 (×38): qty 1

## 2013-10-01 NOTE — Progress Notes (Signed)
Physical Therapy Session Note  Patient Details  Name: Cory [name] MRN: [id number] Date of Birth: 1981/03/11  Today's Date: 10/01/2013 Time: 7829-5621 Time Calculation (min): 53 min   Skilled Therapeutic Interventions/Progress Updates:  Patient in reclining chair sitting at the nursing station at the beginning of the session,returned to the room to decrease the number of distractions. Patient is restless tries to get up from the chair,tries to bite. Session focused on directing attention apporiate to given instructions. Patient was able to shake hands x1 with increased verbal cues and visual output. Passing the football and a small yellow ball between patient and this therapist, effective less than 50% of time, patient is difficult to redirect and to follow even simple instructions. Patient was able to cross his legs and uncross them when prompted, with increased verbal and tactile cues patient performed 10 x R leg kicks against resistance.  Not able to participate in washing face,ties to bite the cleaning cloth.  Gentle tapping and massage attempted to R UE, patient allowed it for few seconds than pulls his hand away. Tactile cues to increase unsupported sitting in the recliner, patient pulls forward but w/o assistance no able to maintain postural control, strong extensor tone,. No signs of pain, patient has been returned to nurses station.  Therapy Documentation Precautions:  Precautions Precautions: Fall Precaution Comments: peg with abdominal binder, LUE splint, and L LE KI.   Required Braces or Orthoses: Knee Immobilizer - Left Knee Immobilizer - Left: On at all times Other Brace/Splint: Bil PRAFOs and L UE and LE splint can be removed for ROM with Ot/PT only.   Restrictions Weight Bearing Restrictions: Yes LUE Weight Bearing: Non weight bearing LLE Weight Bearing: Non weight bearing Vital Signs: Therapy Vitals Temp: 98.4 F (36.9 C) Temp src: Axillary Pulse Rate: 87 Resp:  18 BP: 88/52 mmHg (RN notified) Patient Position, if appropriate: Lying Oxygen Therapy SpO2:  (not able to get on patient)  See FIM for current functional status  Therapy/Group: Individual Therapy  Dorna Mai 10/01/2013, 3:10 PM

## 2013-10-01 NOTE — Progress Notes (Signed)
Late Entry: Patient slightly more agitated at begin of shift, needing 2 staff members present to complete nursing treatments. Patient appeared to be sleeping well after hs bath during rounding until 6 am for scheduled medication. Continue plan of care. Ercilia Bettinger, LPN

## 2013-10-01 NOTE — Progress Notes (Signed)
Patient very calm and more cooperative at begin of shift, only one staff member needed to work with patient during med administration and dressing change to L foot. Cont plan of care.

## 2013-10-01 NOTE — Progress Notes (Signed)
Subjective/Complaints:  No new issues. A  review of systems has been performed and if not noted above is otherwise negative. .  Objective: Vital Signs: Blood pressure 104/63, pulse 103, temperature 97.8 F (36.6 C), temperature source Axillary, resp. rate 20, height 5' 2.99" (1.6 m), weight 47.083 kg (103 lb 12.8 oz), SpO2 100.00%. No results found. No results found for this basename: WBC, HGB, HCT, PLT,  in the last 72 hours No results found for this basename: NA, K, CL, CO, GLUCOSE, BUN, CREATININE, CALCIUM,  in the last 72 hours CBG (last 3)  No results found for this basename: GLUCAP,  in the last 72 hours  Wt Readings from Last 3 Encounters:  09/29/13 47.083 kg (103 lb 12.8 oz)  09/13/13 46.811 kg (103 lb 3.2 oz)  09/13/13 46.811 kg (103 lb 3.2 oz)    Physical Exam:  Constitutional:      lethargic Eyes: Right eye exhibits no discharge. Left eye exhibits no discharge.  Neck:  Janina Mayo out , stoma closed Cardiovascular: Normal rate and regular rhythm.  Respiratory: Effort normal.  GI: Soft.  Lymphadenopathy:  He has no cervical adenopathy.  Neurological:    . Continues to reach and grab at objects on the right. . Not following commands. Will make brief eye contact with me but attention is only a few secs Musc:  Left arm remains in splint, left leg in splint. G/J tube in place.  Skin: no drainage around G/J tube , hypertrophic granulation tissue, improving after silver nitrate   Assessment/Plan: 1. 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 Physiatrist is providing close team supervision and 24 hour management of active medical problems listed below. Physiatrist and rehab team continue to assess barriers to discharge/monitor patient progress toward functional and medical goals.  Still  a RLAS 2-3.  FIM: FIM - Bathing Bathing: 1: Two helpers  FIM -  Upper Body Dressing/Undressing Upper body dressing/undressing: 1: Two helpers FIM - Lower Body Dressing/Undressing Lower body dressing/undressing: 1: Two helpers  FIM - Toileting Toileting: 0: No continent bowel/bladder events this shift  FIM - Archivist Transfers: 0-Activity did not occur  FIM - Games developer Transfer: 1: Two helpers  FIM - Locomotion: Wheelchair Locomotion: Wheelchair: 1: Total Assistance/staff pushes wheelchair (Pt<25%) FIM - Locomotion: Ambulation Ambulation/Gait Assistance: Not tested (comment) Locomotion: Ambulation: 0: Activity did not occur  Comprehension Comprehension Mode: Auditory Comprehension: 1-Understands basic less than 25% of the time/requires cueing 75% of the time  Expression Expression Mode: Nonverbal Expression: 1-Expresses basis less than 25% of the time/requires cueing greater than 75% of the time.  Social Interaction Social Interaction: 1-Interacts appropriately less than 25% of the time. May be withdrawn or combative.  Problem Solving Problem Solving: 1-Solves basic less than 25% of the time - needs direction nearly all the time or does not effectively solve problems and may need a restraint for safety  Memory Memory: 1-Recognizes or recalls less than 25% of the time/requires cueing greater than 75% of the time  Medical Problem List and Plan:  1. DVT Prophylaxis/Anticoagulation: Pharmaceutical: Lovenox  2. Pain Management: Prn oxycodone for outward signs of pain.  3. Mood: Currently too low level to evaluate.  4. Neuropsych: This patient is not capable of making decisions on his own behalf.   -increased amantadine 5. Recurrent Fever: afebrile  white count declining, recheck   -wound culture with MRSA---bactrim for 7 days  -PEG  site cultured negative 6. Left tibial plateau Fx--s/p ORIF: NWB with  ROM only with PT. Otherwise always in splint 7. Left humerus Fx--s/p ORIF: NWB- may be out of splint with  therapy  -HO and  Likely MO surrounding fracture and injury site---Range as tolerated 8. Leucocytosis: improving 9. Left shin and heel wound: Continue bilateral PRAFO. Pressure relief measures-- WOC assistance appreciated 10. ABLA: hgb rechecks 11. Dysphagia: Continue TF for supplement. Continue D1, honey liquids with assistance.  12. Agitation: seroquel- decrease further     ativan prn. Vail bed for safety--still required  LOS (Days) 18 A FACE TO FACE EVALUATION WAS PERFORMED  Cory [name] T 10/01/2013 8:25 AM

## 2013-10-01 NOTE — Progress Notes (Signed)
Occupational Therapy Session Note  Patient Details  Name: Cory [name] MRN: [id number] Date of Birth: 07-Dec-1980  Today's Date: 10/01/2013 Time: 1120-1205 Time Calculation (min): 45 min  Skilled Therapeutic Interventions/Progress Updates: Patient scheduled for OT dressing/activity.  Per night patient is scheduled for night bathing.  Upon approach for therapy, patient with large pm and most of the session was spent by this clinican cleaning up patient and changing brief.   Patient was able to follow commands to laterally turn for the clean up about 15% of the time.  Patient constantly rolled lateraly or tried to sit up during the clean up and brief change.  Patient not oriented to place, situation, etc and tossed, turned and constantly moved during the session.  Patient required total A to don gown.   Patient was able to follow command to sit still on the side of the veil bed. He required total A x2 for bed to recliner chair transfer.    At the end of the session, this clinician and rehab tech donned chair safety belt and reclined chair back for safe patient positioning and put pillows in the sides of chair and behind patient's head for comfort and then took him to the nurse's station for safe superivision    Therapy Documentation Precautions:  Precautions Precautions: Fall Precaution Comments: peg with abdominal binder, LUE splint, and L LE KI.   Required Braces or Orthoses: Knee Immobilizer - Left Knee Immobilizer - Left: On at all times Other Brace/Splint: Bil PRAFOs and L UE and LE splint can be removed for ROM with Ot/PT only.   Restrictions Weight Bearing Restrictions: Yes LUE Weight Bearing: Non weight bearing LLE Weight Bearing: Non weight bearing  Pain:denied   See FIM for current functional status  Therapy/Group: Individual Therapy  Bud Face [facility] 10/01/2013, 3:34 PM

## 2013-10-02 ENCOUNTER — Inpatient Hospital Stay (HOSPITAL_COMMUNITY): Payer: Medicaid Other | Admitting: *Deleted

## 2013-10-02 ENCOUNTER — Inpatient Hospital Stay (HOSPITAL_COMMUNITY): Payer: Medicaid - Out of State | Admitting: Occupational Therapy

## 2013-10-02 ENCOUNTER — Inpatient Hospital Stay (HOSPITAL_COMMUNITY): Payer: Medicaid Other | Admitting: Speech Pathology

## 2013-10-02 NOTE — Progress Notes (Signed)
Occupational Therapy Note  Patient Details  Name: Cory [name] MRN: [id number] Date of Birth: 07-09-1981 Today's Date: 10/02/2013  Time In:  10;30  Time Out:  11;40. Individual session, no apparent pain.  Patient restless in bed upon arrival.  Two person lift to move patient from bed to wheelchair. Patient did wash his face upon command with direct command and contextual cue of handing him a wet washcloth.  Patient did not follow command to "comb your hair" when presented with comb.  Patient did take comb on command but attempted to eat hit.  Patient did allow this therapist to comb hair briefly.  Patient tolerated removal of LUE splint, washing of left arm and skin care to left arm.  Patient also tolerated limited PROM to left UE.  Patient also squeezed hand upon command with left hand one time;  When this therapist asked him to squeeze again, patient pushed hand away and shook his head "no."  Patient making eye contact while removing splint, washing UE and with PROM.  Patient also held arm up for therapist on command when removing and replacing splint.  Patient followed approximately 25% of one step commands this am.  Provided basic verbal orientation info however unsure of how much patient is able to process or comprehend at this point.  Will continue to provide short basic orientation information in the event that patient may be processing at a basic level.    Norton Pastel 10/02/2013, 11:29 AM
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
## 2013-10-02 NOTE — Progress Notes (Signed)
Speech Language Pathology Daily Session Note  Patient Details  Name: Westlee Devita MRN: [id number] Date of Birth: Nov 25, 1980  Today's Date: 10/02/2013 Time: 1415-1500 Time Calculation (min): 45 min  Short Term Goals: Week 3: SLP Short Term Goal 1 (Week 3): Pt will localize response in the right field  of enviornment with auditory stimuli with 50% of opportunities with max A multimodal cueing.  SLP Short Term Goal 2 (Week 3): Pt will visually track a functional item with 75% of opportunities with Max A multimodal cueing during a self-feeding task. SLP Short Term Goal 3 (Week 3): Pt will focus attention to a functioanl task for 30 seconds with Max A multimodal cueing during self-feeding task   Skilled Therapeutic Interventions: Treatment focus on introducing basic stimuli for opportunities for purposeful responses. Upon arrival, pt awake in enclosure bed and transferred to wheelchair with total A +2. SLP facilitated session by providing auditory and tactile stimuli in left field of environment. Pt made eye contact with clinician with 75% of opportunities, however, responded to yes/no questions with head nods with 0% of opportunities. Pt initiated self-feeding of Dys. 1 textures and honey-thick liquids via cup at midline and was able to track magic cup to both the right and left field of environment. Pt also required intermittent assistance with management of spoon and cup. Pt sustained attention to task for ~3-4 bites of magic cup at a time. Pt with intermittent throat clearing throughout meal. Pt also followed 1 step commands with 25% of opportunities in regards to functional tasks.  Continue plan of care.    FIM:  Comprehension Comprehension Mode: Auditory Comprehension: 1-Understands basic less than 25% of the time/requires cueing 75% of the time Expression Expression Mode: Nonverbal Expression: 1-Expresses basis less than 25% of the time/requires cueing greater than 75% of the  time. Social Interaction Social Interaction: 1-Interacts appropriately less than 25% of the time. May be withdrawn or combative. Problem Solving Problem Solving: 1-Solves basic less than 25% of the time - needs direction nearly all the time or does not effectively solve problems and may need a restraint for safety Memory Memory: 1-Recognizes or recalls less than 25% of the time/requires cueing greater than 75% of the time FIM - Eating Eating Activity: 4: Help with managing cup/glass  Pain Pain Assessment Pain Assessment: No/denies pain  Therapy/Group: Individual Therapy  Jahni Paul 10/02/2013, 3:44 PM

## 2013-10-02 NOTE — Progress Notes (Signed)
Physical Therapy Session Note  Patient Details  Name: Cory [name] MRN: [id number] Date of Birth: Feb 08, 1981  Today's Date: 10/02/2013 Time: [phone] and [phone] Time Calculation (min): 55 min and 40 min  Short Term Goals: Week 2:  PT Short Term Goal 1 (Week 2): Patient will demonstrate a localized response in right field with audiotory stimulus 75% of the time with max cuing PT Short Term Goal 2 (Week 2): Pt will demonstrate focused attention for 3 seconds with max cuing for in preparation for bed<>wheelchair transfer. PT Short Term Goal 3 (Week 2): Pt will visually track a functional object 50% of opportunities with max cuing  Skilled Therapeutic Interventions/Progress Updates:    First session: Patient received supine in bed. Patient less engaged with therapist this AM-decreased eye contact, attending to therapist. Session focused on localized response to stimuli. Patient continues with consistent response to apparent noxious stimulation when PROM for R and L hip/knee flexion and R shoulder/elbow flexion, noted facial grimacing and increased vocalizations, rolling away from stimulus. Patient continues with strong extensor tone in B LE during flexion PROM, difficult to break through to flexion, however patient able to tolerate with prolonged passive stretching in flexion. Patient grimaces and localizes to each knee when passively stretched, using B UEs to attempt to remove therapist's hands. Attempted to engage patient in opportunities to follow simple commands, but patient demonstrating prolonged periods of eyes closed and not interacting with therapist.   Stimuli introduced     Response  Auditory:   Name called    Responds 3/3 trials from L and R         Olfactory:  Coffee     No response                  Tooth paste    No response        Alcohol Swab    No response  Pain:    Nail Bed pressure    Withdraws all limbs  Temperature: Bottom of feet/ sides of face        Hot      No  response        Cold     No response   Session focused on opportunities for patient to engage in functional self-care tasks: washing face, removing washcloth from face/neck, combing hair, looking in mirror, etc. Patient continues to demonstrate strong biting reflex today towards any/all objects placed in visual field. Patient able to attend when mirror positioned in front of him. Patient demonstrating increased bimanual integration, switching objects between hands. Patient able to consistently follow commands to grab objects, but unable to use functionally (once he has grabbed them, he brings them to his mouth). With [facility] assist for initiation, patient able to comb hair x2. Patient left seated in wheelchair with H strap in place for safety at RN station.  Second session: Patient received sitting in wheelchair, from OT. Session focused on opportunities to follow simple commands with patient positioned on treatment mat. Patient continues to be very active, rolling B directions, sitting up into long sitting then returning to supine. Patient able to follow simple commands in this position approx 25% of trials (lift your arm, touch your leg, touch my hand, roll towards me, etc.). Patient demonstrating more purposeful behaviors today: adjusting H straps due to discomfort, lifting arm for arm rests to be replaced, lifting arm to reposition cast, etc. Patient returned to room and left supine in enclosure bed.  Therapy Documentation Precautions:  Precautions  Precautions: Fall Precaution Comments: peg with abdominal binder, LUE splint, and L LE KI.   Required Braces or Orthoses: Knee Immobilizer - Left Knee Immobilizer - Left: On at all times Other Brace/Splint: Bil PRAFOs and L UE and LE splint can be removed for ROM with Ot/PT only.   Restrictions Weight Bearing Restrictions: Yes LUE Weight Bearing: Non weight bearing LLE Weight Bearing: Non weight bearing Locomotion : Ambulation Ambulation/Gait  Assistance: Not tested (comment)   See FIM for current functional status  Therapy/Group: Individual Therapy  Chipper Herb. Edwardo Wojnarowski, PT, DPT 10/02/2013, 9:27 AM

## 2013-10-03 ENCOUNTER — Inpatient Hospital Stay (HOSPITAL_COMMUNITY): Payer: Medicaid Other | Admitting: Occupational Therapy

## 2013-10-03 ENCOUNTER — Inpatient Hospital Stay (HOSPITAL_COMMUNITY): Payer: Medicaid Other | Admitting: *Deleted

## 2013-10-03 ENCOUNTER — Inpatient Hospital Stay (HOSPITAL_COMMUNITY): Payer: Medicaid Other | Admitting: Speech Pathology

## 2013-10-03 DIAGNOSIS — IMO0002 Reserved for concepts with insufficient information to code with codable children: Secondary | ICD-10-CM

## 2013-10-03 DIAGNOSIS — S069X9A Unspecified intracranial injury with loss of consciousness of unspecified duration, initial encounter: Secondary | ICD-10-CM

## 2013-10-03 LAB — CBC WITH DIFFERENTIAL/PLATELET
Eosinophils Absolute: 0.1 10*3/uL (ref 0.0–0.7)
Hemoglobin: 10.1 g/dL — ABNORMAL LOW (ref 13.0–17.0)
Lymphocytes Relative: 10 % — ABNORMAL LOW (ref 12–46)
Lymphs Abs: 1.2 10*3/uL (ref 0.7–4.0)
MCH: 30.1 pg (ref 26.0–34.0)
Neutro Abs: 9.6 10*3/uL — ABNORMAL HIGH (ref 1.7–7.7)
Neutrophils Relative %: 81 % — ABNORMAL HIGH (ref 43–77)
Platelets: 299 10*3/uL (ref 150–400)
RBC: 3.35 MIL/uL — ABNORMAL LOW (ref 4.22–5.81)
WBC: 11.8 10*3/uL — ABNORMAL HIGH (ref 4.0–10.5)

## 2013-10-03 MED ORDER — PROPRANOLOL HCL 10 MG PO TABS
10.0000 mg | ORAL_TABLET | Freq: Three times a day (TID) | ORAL | Status: DC
  Administered 2013-10-03 – 2013-10-07 (×12): 10 mg via ORAL
  Filled 2013-10-03 (×17): qty 1

## 2013-10-03 MED ORDER — COLLAGENASE 250 UNIT/GM EX OINT
TOPICAL_OINTMENT | Freq: Two times a day (BID) | CUTANEOUS | Status: DC
Start: 2013-10-03 — End: 2013-10-11
  Administered 2013-10-03 – 2013-10-10 (×15): via TOPICAL
  Filled 2013-10-03: qty 30

## 2013-10-03 NOTE — Progress Notes (Signed)
Physical Therapy Session Note  Patient Details  Name: Holden Maniscalco MRN: [id number] Date of Birth: 09-Dec-1980  Today's Date: 10/03/2013 Time: 9629-5284 and 1130 ([place]reat with PT [phone])-1145 Time Calculation (min): 55 min and 15 min  Short Term Goals: Week 2:  PT Short Term Goal 1 (Week 2): Patient will demonstrate a localized response in right field with audiotory stimulus 75% of the time with max cuing PT Short Term Goal 2 (Week 2): Pt will demonstrate focused attention for 3 seconds with max cuing for in preparation for bed<>wheelchair transfer. PT Short Term Goal 3 (Week 2): Pt will visually track a functional object 50% of opportunities with max cuing  Skilled Therapeutic Interventions/Progress Updates:    First session: Patient received supine in bed, no signs/symptoms of pain. Session focused on localized response to stimuli. When patient reflexively sits up in bed this AM, patient reaching for bedrails, appearing to use for stabilization, unsure if this is purposeful or reflexive? Two person lift bed>wheelchair. Patient positioned in wheelchair with H strap for safety. Patient presented with opportunities for simple command following with functional and recreational objects (lotion, comb, ball). Patient able to take various objects in R hand when instructed to do so, however, patient unable to use functionally and brings each object to mouth each trial. Patient able to continue combing hair x2 strokes when initiated activity with [facility] assist. Patient able to take ball in R hand and throw it when instructed to do so 5/10 trials. Patient with decreased visual tracking of objects in visual field this AM. Patient able to reach R hand out when instructed to do so for application of lotion. Initially, patient attempting to bring to mouth, but when instructed to rub hands together, was able to do so twice for approx 3". Attempted localized response to smell with alcohol pad, patient continues to  open mouth and flex head/neck/trunk forward in attempts to put in mouth. Noted increased use of R UE to scratch head/face this AM. Patient followed approximately 25% of one step commands this AM. Provided basic verbal orientation to place and situation, unsure of how much patient processes. Patient left seated in wheelchair at RN station with H strap in place.   Second session: Patient received sitting in wheelchair at RN station, restful at this time. Co-treatment with PT to address localized response to stimuli. Two person lift wheelchair>bed. Performed sitting edge of bed with +2 assist, patient continues to demonstrate reflexive flexion/extension of trunk and B LEs. Patient performed x4 sit<>stands, remaining standing with +2 assist for approx 5-10" each time, NWB L LE maintained. Sitting edge of bed, assistance provided to block each knee to reduce extension of B LEs. Patient appearing less tolerable of this hands on/manual facilitation, repeatedly using R hand to attempt to move therapists' legs and using R LE to push therapists' legs away. Patient returned to supine, rolls to L side and uses R UE to pull pillow towards him and under his head. Moved pillows superiorly on bed, patient sitting up and actively moving head, attempting to locate pillows. When patient locates pillows, rolls onto R side and props himself onto R elbow, scoots himself superior in bed, towards pillows, three times. Patient then closes eyes and rolls away from therapists. Patient left supine in enclosure bed.  Therapy Documentation Precautions:  Precautions Precautions: Fall Precaution Comments: peg with abdominal binder, LUE splint, and L LE KI.   Required Braces or Orthoses: Knee Immobilizer - Left Knee Immobilizer - Left: On at all times  Other Brace/Splint: Bil PRAFOs and L UE and LE splint can be removed for ROM with Ot/PT only.   Restrictions Weight Bearing Restrictions: Yes LUE Weight Bearing: Non weight bearing LLE  Weight Bearing: Non weight bearing Pain: Pain Assessment Pain Assessment: Faces Faces Pain Scale: Hurts little more Pain Type: Acute pain Pain Location: Leg Pain Orientation: Left;Lower Pain Intervention(s): Repositioned;Massage Locomotion : Ambulation Ambulation/Gait Assistance: Not tested (comment)   See FIM for current functional status  Therapy/Group: Individual Therapy during first session and Co-Treatment with PT second session  Neomia Herbel S Jackey Loge. Michalla Ringer, PT, DPT 10/03/2013, 12:15 PM

## 2013-10-03 NOTE — Progress Notes (Signed)
Speech Language Pathology Daily Session Note  Patient Details  Name: Cory [name] MRN: [id number] Date of Birth: 09-Jul-1981  Today's Date: 10/03/2013 Time: 4132-4401 Time Calculation (min): 45 min  Short Term Goals: Week 3: SLP Short Term Goal 1 (Week 3): Pt will localize response in the right field  of enviornment with auditory stimuli with 50% of opportunities with max A multimodal cueing.  SLP Short Term Goal 2 (Week 3): Pt will visually track a functional item with 75% of opportunities with Max A multimodal cueing during a self-feeding task. SLP Short Term Goal 3 (Week 3): Pt will focus attention to a functioanl task for 30 seconds with Max A multimodal cueing during self-feeding task   Skilled Therapeutic Interventions: Treatment focus on introducing basic stimuli for opportunities for purposeful responses. Upon arrival, pt awake in enclosure bed. Pt was incontinent of bowel and bladder and required extra assistance for hygiene due to mild agitation. Pt transferred to wheelchair with total A +2 and pt began to calm. SLP facilitated session by providing auditory and tactile stimuli in right field of environment. Pt made eye contact with clinician with 50% of opportunities and responded to yes/no questions with head nods with 0% of opportunities. Pt initiated self-feeding of Dys. 1 textures and honey-thick liquids via cup at midline and was able to track solids and liquids to both the right and left field of environment. Pt also required intermittent assistance with management of spoon and cup. Pt sustained attention to task for ~5-6 bites of magic cup at a time. Pt with intermittent throat clearing throughout meal which is difficult to differentiate between possible penetrations vs. grunting while repositioning. Pt also followed 1 step commands with 25% of opportunities in regards to functional tasks.  Continue plan of care.    FIM:  Comprehension Comprehension Mode:  Auditory Comprehension: 1-Understands basic less than 25% of the time/requires cueing 75% of the time Expression Expression Mode: Nonverbal Expression: 1-Expresses basis less than 25% of the time/requires cueing greater than 75% of the time. Social Interaction Social Interaction: 1-Interacts appropriately less than 25% of the time. May be withdrawn or combative. Problem Solving Problem Solving: 1-Solves basic less than 25% of the time - needs direction nearly all the time or does not effectively solve problems and may need a restraint for safety Memory Memory: 1-Recognizes or recalls less than 25% of the time/requires cueing greater than 75% of the time  Pain Pain Assessment Pain Assessment: Faces Faces Pain Scale: Hurts little more Pain Type: Acute pain Pain Location: Leg Pain Orientation: Left;Lower Pain Intervention(s): Repositioned;Massage  Therapy/Group: Individual Therapy  Cory [name] 10/03/2013, 2:36 PM

## 2013-10-03 NOTE — Consult Note (Signed)
WOC wound follow up Wound type: Refer to previous progress notes for assessment and plan of care to left heel and left foot wounds.  Left heel full thickness wound unchanged in size and appearance; 100% beefy red, mod amt yellow drainage, loose skin surrounding wound bed from previous blister. Foot has strong fungal odor which appears to be from moist areas between toes. Left anterior foot full thickness wound has evolved to 2X2cm area of tightly adhered slough, surrounded by pink dry scar tissue.  No odor or drainage. Left outer knee with full thickness wound which was previously followed by ortho service; 2.5X1cm, 100% red and fluctuant when probed, appears tender to touch, mod amt yellow-tan pus draining from site.  Marissa Nestle, PA assessed all wounds and discussed plan of care.  Culture ordered for left knee which bedside nurse obtained.   Dressing procedure/placement/frequency: Antifungal powder to toes. Santyl ointment to left anterior foot to chemically debride nonviable tissue.  Continue present plan of care wit Aquacel to promote healing to left heel.  Foam dressing to left outer knee to protect and promote heal;ing.  If increased drainage or odor, consider re-consult to ortho service. Pt has Prevalon heel-lift boots to reduce pressure to BLE.   Please re-consult if further assistance is needed.  Thank-you,  Cammie Mcgee MSN, RN, CWOCN, [place], CNS [phone]
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
## 2013-10-03 NOTE — Progress Notes (Signed)
Wounds examined--shin wound beefy red with central bogginess and thin muco-purulent fluid expressed. Wound cultured and CBC ordered. No fevers. Will monitor for now as had MRSA positive culture at admission. Await culture results.

## 2013-10-03 NOTE — Progress Notes (Signed)
Occupational Therapy Session Note  Patient Details  Name: Cory [name] MRN: [id number] Date of Birth: 12/18/1980  Today's Date: 10/03/2013 Time: 1030-1100 Time Calculation (min): 30 min  Short Term Goals: Week 3:  OT Short Term Goal 1 (Week 3): Pt will localize response in right field with audiotory stimuli with 50% of opportunities with max cuing OT Short Term Goal 2 (Week 3): Pt will demonstrate focused attention for 5 sec during a functional tasks with mod A  OT Short Term Goal 3 (Week 3): Pt will visually track 75% of opportunities with max cuing  OT Short Term Goal 4 (Week 3): Pt will bring bilateral hands together in a functional task 25% of opportunities   Skilled Therapeutic Interventions: ADL-retraining with emphasis on improved attention, functional use of bil UE, and improved visual tracking.   Patient required total assist, +2, transfer from w/c to bed for assisted toilet hygiene and management of orthotics: knee immobilizer (KI).   While supine in bed, patient responded to max verbal cues to remain supine and to maintain side-lying position while assisted with doffing and donning diapers.   Patient responded to removal of KI and assisted with skin care at knee and upper leg, rubbing lotion on his leg with his right hand when cued for approx 5 seconds with setup assist.   During transfer from bed to w/c, patient maintained weight-bearing through R-LE to perform stand-pivot transfer (pt = 20%) with only max assist X1 without evidence of his typical hyperextension of spine or resistance to transfer.    Therapy Documentation Precautions:  Precautions Precautions: Fall Precaution Comments: peg with abdominal binder, LUE splint, and L LE KI.   Required Braces or Orthoses: Knee Immobilizer - Left Knee Immobilizer - Left: On at all times Other Brace/Splint: Bil PRAFOs and L UE and LE splint can be removed for ROM with Ot/PT only.   Restrictions Weight Bearing Restrictions: Yes LUE  Weight Bearing: Non weight bearing LLE Weight Bearing: Non weight bearing  Pain: Pain Assessment Pain Assessment: Faces Faces Pain Scale: Hurts little more Pain Type: Acute pain Pain Location: Leg Pain Orientation: Left;Lower Pain Intervention(s): Repositioned;Massage  See FIM for current functional status  Therapy/Group: Co-Treatment  Ryiah Bellissimo 10/03/2013, 11:29 AM

## 2013-10-03 NOTE — Progress Notes (Signed)
Physical Therapy Session Note  Patient Details  Name: Cory [name] MRN: [id number] Date of Birth: October 04, 1981  Today's Date: 10/03/2013 Time: 4098-1191 Time Calculation (min): 13 min  Skilled Therapeutic Interventions/Progress Updates:   Skilled co-treatment with PT during session to work on localized response to stimuli while sitting EOB and performing sit <> stand.  See Perlie Mayo, PT note for full details of session.   Therapy Documentation Precautions:  Precautions Precautions: Fall Precaution Comments: peg with abdominal binder, LUE splint, and L LE KI.   Required Braces or Orthoses: Knee Immobilizer - Left Knee Immobilizer - Left: On at all times Other Brace/Splint: Bil PRAFOs and L UE and LE splint can be removed for ROM with Ot/PT only.   Restrictions Weight Bearing Restrictions: Yes LUE Weight Bearing: Non weight bearing LLE Weight Bearing: Non weight bearing   Pain: Pain Assessment Pain Assessment: Faces Faces Pain Scale: Hurts little more Pain Type: Acute pain Pain Location: Leg Pain Orientation: Left;Lower Pain Intervention(s): Repositioned;Massage   Locomotion : Ambulation Ambulation/Gait Assistance: Not tested (comment)   See FIM for current functional status  Therapy/Group: Co-Treatment  Beola Vasallo, Meribeth Mattes 10/03/2013, 12:31 PM

## 2013-10-03 NOTE — Progress Notes (Signed)
Occupational Therapy Session Note  Patient Details  Name: Cory [name] MRN: [id number] Date of Birth: April 07, 1981  Today's Date: 10/03/2013 Time: 1000-1030 Time Calculation (min): 30 min  Short Term Goals: Week 3:  OT Short Term Goal 1 (Week 3): Pt will localize response in right field with audiotory stimuli with 50% of opportunities with max cuing OT Short Term Goal 2 (Week 3): Pt will demonstrate focused attention for 5 sec during a functional tasks with mod A  OT Short Term Goal 3 (Week 3): Pt will visually track 75% of opportunities with max cuing  OT Short Term Goal 4 (Week 3): Pt will bring bilateral hands together in a functional task 25% of opportunities   Skilled Therapeutic Interventions/Progress Updates:    1:1 Co treat with another occupational therapist - read 1030-11am note for the other half of the session. 1st half focus on doffing left UE splint and allowing AROM in all planes, addressing hypersensitivity with washing and applying lotion to UE. Pt with increased tolerance to touch throughout his left UE compared to last week. Pt demonstrating sustained to stimuli of removing splint; pt removed sugar tong splint and unwrapped the curlex from UE with min A . Pt able to demonstrate focused attention with more than reasonable amt of time to perform washing of left UE with rag (25% of time would take rag to mouth be could be redirected). Assisted with application of lotion to arm with tactile and HOH assist to initiate task. Pt tolerate ROM and care to UE without any resistance (pushing therapist's hands away). Pt visually tracked to right with more than reasonable amt of time to auditory stimuli.   Therapy Documentation Precautions:  Precautions Precautions: Fall Precaution Comments: peg with abdominal binder, LUE splint, and L LE KI.   Required Braces or Orthoses: Knee Immobilizer - Left Knee Immobilizer - Left: On at all times Other Brace/Splint: Bil PRAFOs and L UE and LE  splint can be removed for ROM with Ot/PT only.   Restrictions Weight Bearing Restrictions: Yes LUE Weight Bearing: Non weight bearing LLE Weight Bearing: Non weight bearing Pain: Pain Assessment Pain Assessment: Faces Faces Pain Scale: Hurts little more Pain Type: Acute pain Pain Location: Leg Pain Orientation: Left;Lower Pain Intervention(s): Repositioned;Massage  See FIM for current functional status  Therapy/Group: Individual Therapy  [name] [facility] 10/03/2013, 2:08 PM

## 2013-10-03 NOTE — Progress Notes (Signed)
Subjective/Complaints:  Intermittently follows commands and participates on a basic level with therapists A  review of systems has been performed and if not noted above is otherwise negative. .  Objective: Vital Signs: Blood pressure 110/63, pulse 97, temperature 97.5 F (36.4 C), temperature source Axillary, resp. rate 18, height 5' 2.99" (1.6 m), weight 47.083 kg (103 lb 12.8 oz), SpO2 90.00%. No results found. No results found for this basename: WBC, HGB, HCT, PLT,  in the last 72 hours No results found for this basename: NA, K, CL, CO, GLUCOSE, BUN, CREATININE, CALCIUM,  in the last 72 hours CBG (last 3)  No results found for this basename: GLUCAP,  in the last 72 hours  Wt Readings from Last 3 Encounters:  09/29/13 47.083 kg (103 lb 12.8 oz)  09/13/13 46.811 kg (103 lb 3.2 oz)  09/13/13 46.811 kg (103 lb 3.2 oz)    Physical Exam:  Constitutional:      Lethargic to restless, alternating Eyes: Right eye exhibits no discharge. Left eye exhibits no discharge.  Neck:  Janina Mayo out , stoma closed Cardiovascular: Normal rate and regular rhythm.  Respiratory: Effort normal.  GI: Soft.  Lymphadenopathy:  He has no cervical adenopathy.  Neurological:    Awake,distracted. Not following commands. Will make brief eye contact with me but attention is only a few secs Musc:  Left arm remains in splint, left leg in splint. G/J tube in place.  Skin: no drainage around G/J tube . Left ankle/foot stable   Assessment/Plan: 1. 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 Physiatrist is providing close team supervision and 24 hour management of active medical problems listed below. Physiatrist and rehab team continue to assess barriers to discharge/monitor patient progress toward functional and medical goals.  Still  a RLAS 2-3.  FIM: FIM - Bathing Bathing Steps Patient  Completed:  (patient is scheduled for night bathing) Bathing: 0: Activity did not occur (did not occur with therapies)  FIM - Upper Body Dressing/Undressing Upper body dressing/undressing: 0: Activity did not occur FIM - Lower Body Dressing/Undressing Lower body dressing/undressing: 0: Activity did not occur  FIM - Toileting Toileting: 0: Activity did not occur  FIM - Archivist Transfers: 0-Activity did not occur  FIM - Games developer Transfer: 1: Two helpers  FIM - Locomotion: Wheelchair Locomotion: Wheelchair: 1: Total Assistance/staff pushes wheelchair (Pt<25%) FIM - Locomotion: Ambulation Ambulation/Gait Assistance: Not tested (comment) Locomotion: Ambulation: 0: Activity did not occur  Comprehension Comprehension Mode: Auditory Comprehension: 1-Understands basic less than 25% of the time/requires cueing 75% of the time  Expression Expression Mode: Nonverbal Expression: 1-Expresses basis less than 25% of the time/requires cueing greater than 75% of the time.  Social Interaction Social Interaction: 1-Interacts appropriately less than 25% of the time. May be withdrawn or combative.  Problem Solving Problem Solving: 1-Solves basic less than 25% of the time - needs direction nearly all the time or does not effectively solve problems and may need a restraint for safety  Memory Memory: 1-Recognizes or recalls less than 25% of the time/requires cueing greater than 75% of the time  Medical Problem List and Plan:  1. DVT Prophylaxis/Anticoagulation: Pharmaceutical: Lovenox  2. Pain Management: Prn oxycodone for outward signs of pain.  3. Mood: Currently too low level to evaluate.  4. Neuropsych: This patient is not capable of making decisions on his own behalf.   -increased amantadine  -more direct, but  limited, interaction with staff on very basic level 5. Recurrent Fever: afebrile  white count declining, recheck   -wound culture with  MRSA---bactrim for 7 days  -PEG site cultured negative 6. Left tibial plateau Fx--s/p ORIF: NWB with  ROM only with PT. Otherwise always in splint 7. Left humerus Fx--s/p ORIF: NWB- may be out of splint with therapy  -HO and  Likely MO surrounding fracture and injury site---Range as tolerated 8. Leucocytosis: improving 9. Left shin and heel wound: Continue bilateral PRAFO. Pressure relief measures-- WOC assistance appreciated 10. ABLA: hgb rechecks 11. Dysphagia: Continue TF for supplement. Continue D1, honey liquids with assistance.  12. Agitation: seroquel- decreased to hs only     ativan prn. [place] bed for safety--still required  LOS (Days) 20 A FACE TO FACE EVALUATION WAS PERFORMED  SWARTZ,ZACHARY T 10/03/2013 8:03 AM

## 2013-10-03 NOTE — Progress Notes (Signed)
Dressing changed to left leg. Red blister type area noted to outer left  leg. Wound RN , Dawn able to assess area. Continue with  Plan of care .                          Cory [name]

## 2013-10-04 ENCOUNTER — Inpatient Hospital Stay (HOSPITAL_COMMUNITY): Payer: Medicaid Other | Admitting: *Deleted

## 2013-10-04 ENCOUNTER — Inpatient Hospital Stay (HOSPITAL_COMMUNITY): Payer: Medicaid - Out of State | Admitting: Occupational Therapy

## 2013-10-04 ENCOUNTER — Inpatient Hospital Stay (HOSPITAL_COMMUNITY): Payer: Medicaid Other | Admitting: Speech Pathology

## 2013-10-04 DIAGNOSIS — IMO0002 Reserved for concepts with insufficient information to code with codable children: Secondary | ICD-10-CM

## 2013-10-04 DIAGNOSIS — S069X9A Unspecified intracranial injury with loss of consciousness of unspecified duration, initial encounter: Secondary | ICD-10-CM

## 2013-10-04 LAB — CREATININE, SERUM
Creatinine, Ser: 0.46 mg/dL — ABNORMAL LOW (ref 0.50–1.35)
GFR calc Af Amer: >90 mL/min (ref >90)
GFR calc non Af Amer: >90 mL/min (ref >90)

## 2013-10-04 MED ORDER — SULFAMETHOXAZOLE-TMP DS 800-160 MG PO TABS
1.0000 | ORAL_TABLET | Freq: Two times a day (BID) | ORAL | Status: DC
  Administered 2013-10-04 – 2013-10-17 (×27): 1 via ORAL
  Filled 2013-10-04 (×31): qty 1

## 2013-10-04 NOTE — Progress Notes (Signed)
Large open abrasion with moderate purulent drainage noted to patient's left shin, distal to left knee.  Wound measures  6.0 x 2.0 x 0.1. Wound bed 90% yellow, 10% pink. Dressing changed and new foam dressing applied.

## 2013-10-04 NOTE — Progress Notes (Signed)
Speech Language Pathology Daily Session Note  Patient Details  Name: Cory [name] MRN: [id number] Date of Birth: 02-Dec-1980  Today's Date: 10/04/2013 Time: 1000-1045 Time Calculation (min): 45 min   Skilled Therapeutic Interventions: Treatment focus on dysphagia goals. SLP facilitated session by providing Mod-Max A verbal and tactile cues for utilization of small bites/sips with Dys. 1 textures with honey-thick liquids.  Pt with intermittent throat clearing X 2 throughout the meal, suspect due to penetration. Throat clear appears strong enough to clear penetrates. Pt also consumed trials of nectar-thick liquids via cup without overt s/s of aspiration but demonstrated consistent throat clear with trials of thin via cup. Pt also demonstrated efficient mastication with Dys. 3 textures without overt s/s of aspiration.  Pt also utilized multiple sips with all trials. Recommend to continue current diet and allow pt to eat with full supervision from staff members.  Pt shook his head "no" X 2 (less than 25% of opportunities) in response to functional questions and followed 1 step commands with 75% of opportunities. Pt also demonstrated increased problem solving in regards to manipulation of cup and spoon.  Continue plan of care.    FIM:  Comprehension Comprehension Mode: Auditory Comprehension: 1-Understands basic less than 25% of the time/requires cueing 75% of the time Expression Expression Mode: Nonverbal Expression: 1-Expresses basis less than 25% of the time/requires cueing greater than 75% of the time. Social Interaction Social Interaction: 1-Interacts appropriately less than 25% of the time. May be withdrawn or combative. Problem Solving Problem Solving: 1-Solves basic less than 25% of the time - needs direction nearly all the time or does not effectively solve problems and may need a restraint for safety Memory Memory: 1-Recognizes or recalls less than 25% of the time/requires cueing  greater than 75% of the time FIM - Eating Eating Activity: 4: Help with managing cup/glass  Pain No s/s of pain   Therapy/Group: Individual Therapy  Cory [name] 10/04/2013, 3:16 PM

## 2013-10-04 NOTE — Progress Notes (Signed)
Physical Therapy Session Note  Patient Details  Name: Cory [name] MRN: [id number] Date of Birth: 1981/08/28  Today's Date: 10/04/2013 Time: 0900-0928 and 1304-1400 Time Calculation (min): 28 min and 56 min  Short Term Goals: Week 2:  PT Short Term Goal 1 (Week 2): Patient will demonstrate a localized response in right field with audiotory stimulus 75% of the time with max cuing PT Short Term Goal 2 (Week 2): Pt will demonstrate focused attention for 3 seconds with max cuing for in preparation for bed<>wheelchair transfer. PT Short Term Goal 3 (Week 2): Pt will visually track a functional object 50% of opportunities with max cuing  Skilled Therapeutic Interventions/Progress Updates:    AM Session: Patient received supine in bed, RN and nurse tech present to change dressings on patient's L foot. Assisted RN and nurse tech with required mobility to change dressings. Verbal instructions provided to patient to remain still so that RN can change dressings and patient able to remain still for approx 4-5 minutes during dressing change with only 2 instances of rolling. RN and nurse tech requesting to obtain patient's weight. Patient transferred bed>wheelchair>bed>wheelchair via 2 person lift to obtain weight. Patient positioned with H strap in wheelchair for safety. Patient lifts L arm to assist with H strap being donned. Seated in wheelchair, patient with decreased restlessness with exception of R LE which continues to move. Patient handed bottle of lotion, which he attempted to bring to mouth. Instructed to patient to hold out his hand and he does so. Lotion squeezed into his R hand, patient initially attempting to bring to mouth, but able to rub hands together when instructed to do so. Patient left seated in wheelchair with H strap donned at RN station.  PM Session: Patient received sitting in wheelchair with H strap on place at RN station. Patient attempting to doff L knee immobilizer at beginning  of session. Patient encouraged to complete activity to apply lotion to skin. Patient able to unfasten 3 of 4 velcro straps. Addressed hypersensitivity of L UE with washing and applying lotion to UE. Patient with increased tolerance to touch throughout his left UE. Pt participated in bathing both UEs with cloth with decreased  oral reflex of bringing cloth to his mouth (only 2x). Patient participated in rubbing lotion on B arms and B LE for approx 10 sec with min A before terminating task and therapist completed. Assisted with application of lotion to arm with tactile and HOH assist to initiate task. Patient demonstrating sustained attention to stimuli of donning splint; patient tolerated donning of gauze, splint, and ace wraps and able to participate in assisting by lifting L arm and bending elbow when asked to do so. Patient returned to enclosure bed, requiring clean up for incontinence of urine. While hygiene being performed, patient appearing to become over-stimulated and tolerating less hands on touch, becoming restless and using R UE to push away therapist. Patient left in enclosure bed at end of session.  Therapy Documentation Precautions:  Precautions Precautions: Fall Precaution Comments: peg with abdominal binder, LUE splint, and L LE KI.   Required Braces or Orthoses: Knee Immobilizer - Left Knee Immobilizer - Left: On at all times Other Brace/Splint: Bil PRAFOs and L UE and LE splint can be removed for ROM with Ot/PT only.   Restrictions Weight Bearing Restrictions: Yes LUE Weight Bearing: Non weight bearing LLE Weight Bearing: Non weight bearing Locomotion : Ambulation Ambulation/Gait Assistance: Not tested (comment)   See FIM for current functional status  Therapy/Group:  Individual Therapy  Chipper Herb. Christyann Manolis, PT, DPT 10/04/2013, 9:29 AM
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
## 2013-10-04 NOTE — Progress Notes (Signed)
Social Work Patient ID: Shepard General, male   DOB: [birth year], 32 y.o.   MRN: [id number]  Have reviewed team conference/ SNF search info with pt's mother.  Continue to pursue SNF placement, however, difficulty with securing a bed due to still Medicaid pending status.  To discuss further with SW office to request assistance with facilities willing to accept letter of guarantee.  Will keep staff posted.  Sulo Janczak, LCSW

## 2013-10-04 NOTE — Progress Notes (Signed)
Subjective/Complaints:  No problems over night. Area of concern over left leg which is not healing. He is likely repetitively rubbing the area and irritating it A  review of systems has been performed and if not noted above is otherwise negative. .  Objective: Vital Signs: Blood pressure 144/100, pulse 76, temperature 97.4 F (36.3 C), temperature source Oral, resp. rate 18, height 5' 2.99" (1.6 m), weight 43.1 kg (95 lb 0.3 oz), SpO2 95.00%. No results found.  Recent Labs  10/03/13 1205  WBC 11.8*  HGB 10.1*  HCT 30.3*  PLT 299    Recent Labs  10/04/13 0618  CREATININE 0.46*   CBG (last 3)  No results found for this basename: GLUCAP,  in the last 72 hours  Wt Readings from Last 3 Encounters:  10/04/13 43.1 kg (95 lb 0.3 oz)  09/13/13 46.811 kg (103 lb 3.2 oz)  09/13/13 46.811 kg (103 lb 3.2 oz)    Physical Exam:  Constitutional:      Lethargic to restless, alternating Eyes: Right eye exhibits no discharge. Left eye exhibits no discharge.  Neck:  Janina Mayo out , stoma closed Cardiovascular: Normal rate and regular rhythm.  Respiratory: Effort normal.  GI: Soft.  Lymphadenopathy:  He has no cervical adenopathy.  Neurological:    Awake,distracted. Not following commands. Will make brief eye contact with me but attention is only a few secs Musc:  Left arm remains in splint, left leg in splint. G/J tube in place.  Skin: no drainage around G/J tube . Left ankle/foot stable   Assessment/Plan: 1. 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 Physiatrist is providing close team supervision and 24 hour management of active medical problems listed below. Physiatrist and rehab team continue to assess barriers to discharge/monitor patient progress toward functional and medical goals.  Still  a RLAS 2-3.  FIM: FIM - Bathing Bathing Steps Patient Completed:   (patient is scheduled for night bathing) Bathing: 0: Activity did not occur (did not occur with therapies)  FIM - Upper Body Dressing/Undressing Upper body dressing/undressing: 0: Activity did not occur FIM - Lower Body Dressing/Undressing Lower body dressing/undressing: 0: Activity did not occur  FIM - Toileting Toileting: 0: Activity did not occur  FIM - Archivist Transfers: 0-Activity did not occur  FIM - Games developer Transfer: 1: Two helpers  FIM - Locomotion: Wheelchair Locomotion: Wheelchair: 1: Total Assistance/staff pushes wheelchair (Pt<25%) FIM - Locomotion: Ambulation Ambulation/Gait Assistance: Not tested (comment) Locomotion: Ambulation: 0: Activity did not occur  Comprehension Comprehension Mode: Auditory Comprehension: 1-Understands basic less than 25% of the time/requires cueing 75% of the time  Expression Expression Mode: Nonverbal Expression: 1-Expresses basis less than 25% of the time/requires cueing greater than 75% of the time.  Social Interaction Social Interaction: 1-Interacts appropriately less than 25% of the time. May be withdrawn or combative.  Problem Solving Problem Solving: 1-Solves basic less than 25% of the time - needs direction nearly all the time or does not effectively solve problems and may need a restraint for safety  Memory Memory: 1-Recognizes or recalls less than 25% of the time/requires cueing greater than 75% of the time  Medical Problem List and Plan:  1. DVT Prophylaxis/Anticoagulation: Pharmaceutical: Lovenox  2. Pain Management: Prn oxycodone for outward signs of pain.  3. Mood: Currently too low level to evaluate.  4. Neuropsych: This patient is not capable of making decisions on his own behalf.   -  increased amantadine  -more direct, but limited, interaction with staff on very basic level 5. Recurrent Fever: afebrile  white count declining, recheck   -wound culture with MRSA--  -PEG site  cultured negative  -left leg recultured--results pending. --resume bactrim  -local wound care to help clean up and debride area 6. Left tibial plateau Fx--s/p ORIF: NWB with  ROM only with PT. Otherwise always in splint 7. Left humerus Fx--s/p ORIF: NWB- may be out of splint with therapy  -HO and  Likely MO surrounding fracture and injury site---Range as tolerated 8. Leucocytosis: improving 9. Left shin and heel wound: Continue bilateral PRAFO. Pressure relief measures-- WOC assistance appreciated 10. ABLA: hgb rechecks 11. Dysphagia: Continue TF for supplement. Continue D1, honey liquids with assistance.  12. Agitation: seroquel- decreased to hs only     ativan prn. [place] bed for safety--still required  LOS (Days) 21 A FACE TO FACE EVALUATION WAS PERFORMED  SWARTZ,ZACHARY T 10/04/2013 8:15 AM

## 2013-10-04 NOTE — Progress Notes (Addendum)
Follow up x rays of left tib/fib and humerus ordered past discussion with Dr. Luiz Blare. He concerned about healing and weight bearing on LLE as well as wound that is close to surgical incision.  He'll evaluated films tomorrow and follow up on wound prior to removing restrictions.

## 2013-10-04 NOTE — Progress Notes (Signed)
Occupational Therapy Session Note  Patient Details  Name: Cory [name] MRN: [id number] Date of Birth: 1981-03-02  Today's Date: 10/04/2013 Time: 1100-1200 Time Calculation (min): 60 min  Short Term Goals: Week 3:  OT Short Term Goal 1 (Week 3): Pt will localize response in right field with audiotory stimuli with 50% of opportunities with max cuing OT Short Term Goal 2 (Week 3): Pt will demonstrate focused attention for 5 sec during a functional tasks with mod A  OT Short Term Goal 3 (Week 3): Pt will visually track 75% of opportunities with max cuing  OT Short Term Goal 4 (Week 3): Pt will bring bilateral hands together in a functional task 25% of opportunities   Skilled Therapeutic Interventions/Progress Updates:    1:1 Pt participated in functional self care tasks. Began with doffing left UE splint without resistance. Requested pt to doff curlex once shone the end of the wrapping- pt able to doff the curlex on his own. Pt participated in bathing both UEs with cloth without demonstrating an oral reflex of bringing cloth to his mouth. Pt did demonstrate some preservative behaviors with washing. Pt able to follow direction to place cloth in the wash basin with more than reasonable amt of time with request given 3 times. Pt participated in rubbing lotion on both arms and right LE for 15 sec with min A before terminating task and therapist completed with pt allowing care to be given to left UE. Pt performed ROM in all planes with occasional rubbing elbow and forearm. Introduced [employer] fast streamer to don- pt demonstrated initiation of all parts of donning a shirt and pants.  Pt required initial backwards chaining for donning shirt ; but demonstrated bilateral motor movements to don shirt sitting in w/c. Pt assisted with threading legs in shorts and once threaded pt immediately initiated sit to stand to pull up shorts. Pt performed sit to stand with min A but needed +2 for safety and to maintain NWB on left LE.  Towards the end of the session pt kept desiring to perform sit to stands and initiation ambulation however due to pt inability to maintain WB status only allow sit to stands. This frustrated pt (seen by his face and resistance to sit) however no verbal response.   Therapy Documentation Precautions:  Precautions Precautions: Fall Precaution Comments: peg with abdominal binder, LUE splint, and L LE KI.   Required Braces or Orthoses: Knee Immobilizer - Left Knee Immobilizer - Left: On at all times Other Brace/Splint: Bil PRAFOs and L UE and LE splint can be removed for ROM with Ot/PT only.   Restrictions Weight Bearing Restrictions: Yes LUE Weight Bearing: Non weight bearing LLE Weight Bearing: Non weight bearing Pain: No signs or symptoms of pain in session  See FIM for current functional status  Therapy/Group: Individual Therapy  [name] [facility] 10/04/2013, 2:30 PM

## 2013-10-05 ENCOUNTER — Inpatient Hospital Stay (HOSPITAL_COMMUNITY): Payer: Medicaid - Out of State | Admitting: Occupational Therapy

## 2013-10-05 ENCOUNTER — Inpatient Hospital Stay (HOSPITAL_COMMUNITY): Payer: Medicaid Other

## 2013-10-05 ENCOUNTER — Inpatient Hospital Stay (HOSPITAL_COMMUNITY): Payer: Medicaid Other | Admitting: *Deleted

## 2013-10-05 ENCOUNTER — Inpatient Hospital Stay (HOSPITAL_COMMUNITY): Payer: Medicaid Other | Admitting: Speech Pathology

## 2013-10-05 NOTE — Progress Notes (Signed)
Speech Language Pathology Weekly Progress & Session Notes  Patient Details  Name: Bayne Fosnaugh MRN: [id number] Date of Birth: 1980/11/18  Today's Date: 10/05/2013 Time: 1015-1100  Time Calculation (min): 45 min  Short Term Goals: Week 3: SLP Short Term Goal 1 (Week 3): Pt will localize response in the right field  of enviornment with auditory stimuli with 50% of opportunities with max A multimodal cueing.  SLP Short Term Goal 1 - Progress (Week 3): Met SLP Short Term Goal 2 (Week 3): Pt will visually track a functional item with 75% of opportunities with Max A multimodal cueing during a self-feeding task. SLP Short Term Goal 2 - Progress (Week 3): Met SLP Short Term Goal 3 (Week 3): Pt will focus attention to a functioanl task for 30 seconds with Max A multimodal cueing during self-feeding task  SLP Short Term Goal 3 - Progress (Week 3): Met  New Short-Term Goals:  Week 4: SLP Short Term Goal 1 (Week 4): Pt will respond to yes/no questions in regards to biographical information with gestures/verbalizations with 50% of opportunities SLP Short Term Goal 2 (Week 4): Pt will follow 1 step commands with 75% of opportunities with Mod A multimodal cueing  SLP Short Term Goal 3 (Week 4): Pt will demonstrate sustained attention during a functioanl task for 30 minutes with Mod A verbal cues for redirection  SLP Short Term Goal 4 (Week 4): pt will initiate functional tasks with 50% of opportunities with Mod A multimodal cueing  SLP Short Term Goal 5 (Week 4): Pt will consume Dys. 1 textures with honey-thick liquids with minimal overt s/s of aspiration with Max A verbal cues for utilization of small bites/sips.   Weekly Progress Updates: Pt has made functional gains and has met 3 of 3 STG's this reporting period due to improvements in visual tracking, attention, initiation and functional problem solving with self-feeding tasks. Pt has also begun following simple 1 step commands within functional and  familiar tasks. 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 Pt is also currently consuming Dys. 1 textures with honey-thick liquids with minimal throat clearing and requires Max-Total A for utilization of small bites. Pt would benefit from continued skilled SLP intervention to maximize cognitive and swallowing function and overall functional communication to maximize his overall functional independence. Overall, pt is progressing towards LTG's, continue current plan of care.    SLP Intensity: Minumum of 1-2 x/day, 30 to 90 minutes SLP Frequency: 5 out of 7 days SLP Duration/Estimated Length of Stay: TBD due to SNF placement  SLP Treatment/Interventions: Cueing hierarchy;Cognitive remediation/compensation;Environmental controls;Internal/external aids;Therapeutic Exercise;Speech/Language facilitation;Therapeutic Activities;Patient/family education;Dysphagia/aspiration precaution training;Functional tasks  Daily Session Skilled Therapeutic Intervention: Treatment focus on cognitive-linguistic goals. Upon arrival, pt receiving medications via tube from RN. SLP facilitated session by providing total A multimodal communication for facilitation of "ah" with focus on verbal responses. Pt unable to perform, however, pt demonstrated increased groping and overall vocalizations throughout the session. Pt also head nodded "no" X 2 (~20%) in response to functional questions. At end of session, clinician held out hand for handshake and pt initiated a multi-step and more complex handshake instead. Continue plan of care.  FIM:  Comprehension Comprehension Mode: Auditory Comprehension: 1-Understands basic less than 25% of the time/requires cueing 75% of the time Expression Expression Mode: Verbal;Nonverbal Expression: 1-Expresses basis less than 25% of the time/requires cueing greater than 75% of the time. Social Interaction Social Interaction: 1-Interacts appropriately less  than 25% of the time. May  be withdrawn or combative. Problem Solving Problem Solving: 1-Solves basic less than 25% of the time - needs direction nearly all the time or does not effectively solve problems and may need a restraint for safety Memory Memory: 1-Recognizes or recalls less than 25% of the time/requires cueing greater than 75% of the time General  Amount of Missed SLP Time (min): 15 Minutes Missed Time Reason: Nursing care Pain No signs of pain   Therapy/Group: Individual Therapy  Shequila Neglia 10/05/2013, 3:40 PM

## 2013-10-05 NOTE — Progress Notes (Signed)
Occupational Therapy Weekly Progress Note  Patient Details  Name: Cory [name] MRN: [id number] Date of Birth: 1981/05/03  Today's Date: 10/05/2013 Time: 1115-12:00  Time Calculation (min): 45 min  Patient has met 4 of 4 short term goals.  Pt has continue to make steady progress over this last week.  Pt's behavior is consistent with Rancho Level III and at times demonstrating Level IV. Pt has begun to follow one step commands regarding self care tasks. Pt can track to both fields, initiated sit to stands more frequently appropriately (ie with dressing). Splint on left UE is being taken off and left off in between therapies when pt is out of bed for self ROM. Pt with less hypersensitivity   in UE and allows WFL ROM. Pt is still overall max A- total A cognitively; pt with increased performance with familiar self care tasks. Pt still requires +2  For all self care tasks due to safety and his restless.  Patient continues to demonstrate the following deficits:muscle weakness, decreased cardiorespiratoy endurance, impaired timing and sequencing, abnormal tone, unbalanced muscle activation, decreased coordination and decreased motor planning, decreased midline orientation and decreased attention to right, decreased initiation, decreased attention, decreased awareness, decreased problem solving, decreased safety awareness, decreased memory and delayed processing and decreased sitting balance, decreased standing balance, decreased postural control, hemiplegia, decreased balance strategies and difficulty maintaining precautions and therefore will continue to benefit from skilled OT intervention to enhance overall performance with BADL and Reduce care partner burden.  Patient progressing toward long term goals..  Continue plan of care.  OT Short Term Goals Week 3:  OT Short Term Goal 1 (Week 3): Pt will localize response in right field with audiotory stimuli with 50% of opportunities with max cuing OT Short  Term Goal 1 - Progress (Week 3): Met OT Short Term Goal 2 (Week 3): Pt will demonstrate focused attention for 5 sec during a functional tasks with mod A  OT Short Term Goal 2 - Progress (Week 3): Met OT Short Term Goal 3 (Week 3): Pt will visually track 75% of opportunities with max cuing  OT Short Term Goal 3 - Progress (Week 3): Met OT Short Term Goal 4 (Week 3): Pt will bring bilateral hands together in a functional task 25% of opportunities  OT Short Term Goal 4 - Progress (Week 3): Met Week 4:  OT Short Term Goal 1 (Week 4): Pt will  demosntrate sustation attention to self feeding task for 3 min with mod A  OT Short Term Goal 2 (Week 4): Pt will follow one step simple self care task with mod A  OT Short Term Goal 3 (Week 4): Pt will perform 1/2 grooming tasks with mod  A for initiation  OT Short Term Goal 4 (Week 4): Pt will assist with transfer 20% of the time.  Skilled Therapeutic Interventions/Progress Updates:    1st session: Focus on following basic self care commands (ie washing face, washing UB, brushing teeth with regular toothbrush without toothpaste, and donning a shirt). Pt continues to require more than reasonable amt of time to follow basic commands with environmental and tactile cues and at times requires [facility] for initiation of a task. Pt don shirt over head first then was able to locate each arm hole however required A to put arm fully through hole. Pt very restless throughout the entire session.   2nd session: 13:00-13:45  1:1 focus on self feeding his lunch meal. Therapist would scoop food on utensil and then pt  was able to pick up utensil and self feed himself. Pt with decr accuracy with getting food into mouth requiring frequent oral hygiene. Pt was able to go between wanting a drink versus eating with min cuing. Pt pushed plate away and when asked if pt was finished (with extra time) pt nodded yes!!! Pt did want to eat his dessert and did eat 100% of it. Pt demonstrating likes  and dislikes of food and ability to terminate an eating task.   Therapy Documentation Precautions:  Precautions Precautions: Fall Precaution Comments: peg with abdominal binder, LUE splint, and L LE KI.   Required Braces or Orthoses: Knee Immobilizer - Left Knee Immobilizer - Left: On at all times Other Brace/Splint: Bil PRAFOs and L UE and LE splint can be removed for ROM with Ot/PT only.   Restrictions Weight Bearing Restrictions: Yes LUE Weight Bearing: Non weight bearing LLE Weight Bearing: Non weight bearing Pain:  no signs or symptoms in either session    See FIM for current functional status  Therapy/Group: Individual Therapy  [name] [facility] 10/05/2013, 2:26 PM

## 2013-10-05 NOTE — Progress Notes (Addendum)
Subjective/Complaints:  Slept well. Interacting more with therapy.  A  review of systems has been performed and if not noted above is otherwise negative. .  Objective: Vital Signs: Blood pressure 99/70, pulse 120, temperature 98.5 F (36.9 C), temperature source Oral, resp. rate 17, height 5' 2.99" (1.6 m), weight 47.6 kg (104 lb 15 oz), SpO2 95.00%. Dg Tibia/fibula Left  10/05/2013   CLINICAL DATA:  Tibial fracture.  EXAM: LEFT TIBIA AND FIBULA - 2 VIEW  COMPARISON:  September 23, 2013.  FINDINGS: Status post internal fixation of comminuted fracture involving the proximal tibia. No significant change in alignment of fracture components is noted, although some callus formation is seen since prior exam. Mildly displaced proximal left fibular head fracture is noted as well.  IMPRESSION: Some callus formation is seen around comminuted proximal tibial fracture which has been internally fixated. Proximal fibular fracture is noted as well.   Electronically Signed   By: Roque Lias M.D.   On: 10/05/2013 08:17   Dg Humerus Left  10/05/2013   CLINICAL DATA:  Pain post trauma  EXAM: LEFT HUMERUS - 2+ VIEW  COMPARISON:  September 26, 2013  FINDINGS: Frontal and lateral views were obtained. There is screw and plate fixation through a fracture at the junction of the mid and distal thirds of the humerus. There is extensive callus formation in this area. There are several small bony fragments which are located lateral to the fracture site. The screw and plate fixation device appears intact.  No new fracture.  No dislocation.  IMPRESSION: Postoperative change at fracture site with extensive callus formation, slightly increased. Alignment is overall near anatomic in this area. No dislocation.   Electronically Signed   By: Bretta Bang M.D.   On: 10/05/2013 08:20    Recent Labs  10/03/13 1205  WBC 11.8*  HGB 10.1*  HCT 30.3*  PLT 299    Recent Labs  10/04/13 0618  CREATININE 0.46*   CBG (last 3)   No results found for this basename: GLUCAP,  in the last 72 hours  Wt Readings from Last 3 Encounters:  10/05/13 47.6 kg (104 lb 15 oz)  09/13/13 46.811 kg (103 lb 3.2 oz)  09/13/13 46.811 kg (103 lb 3.2 oz)    Physical Exam:  Constitutional:      Lethargic to restless, alternating Eyes: Right eye exhibits no discharge. Left eye exhibits no discharge.  Neck:  Janina Mayo out , stoma closed Cardiovascular: Normal rate and regular rhythm.  Respiratory: Effort normal.  GI: Soft.  Lymphadenopathy:  He has no cervical adenopathy.  Neurological:    Awake,distracted. Not following commands. Will make brief eye contact with me but attention is only a few secs Musc:  Left arm remains in splint, left leg in splint. G/J tube in place.  Skin: no drainage around G/J tube . Left ankle/foot stable with granulation.. Left shin with clean granulation tissue   Assessment/Plan: 1. 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 Physiatrist is providing close team supervision and 24 hour management of active medical problems listed below. Physiatrist and rehab team continue to assess barriers to discharge/monitor patient progress toward functional and medical goals.  Improved attention. Trying to get up and walk when with therapy. Ortho contacted about potential safety in weight bearing.    FIM: FIM - Bathing Bathing Steps Patient Completed:  (patient is scheduled for night bathing) Bathing: 0: Activity did not occur (  did not occur with therapies)  FIM - Upper Body Dressing/Undressing Upper body dressing/undressing: 0: Activity did not occur FIM - Lower Body Dressing/Undressing Lower body dressing/undressing: 0: Activity did not occur  FIM - Toileting Toileting: 0: Activity did not occur  FIM - Archivist Transfers: 0-Activity did not occur  FIM - Best boy Transfer: 1: Two helpers  FIM - Locomotion: Wheelchair Locomotion: Wheelchair: 1: Total Assistance/staff pushes wheelchair (Pt<25%) FIM - Locomotion: Ambulation Ambulation/Gait Assistance: Not tested (comment) Locomotion: Ambulation: 0: Activity did not occur  Comprehension Comprehension Mode: Auditory Comprehension: 1-Understands basic less than 25% of the time/requires cueing 75% of the time  Expression Expression Mode: Nonverbal Expression: 1-Expresses basis less than 25% of the time/requires cueing greater than 75% of the time.  Social Interaction Social Interaction: 1-Interacts appropriately less than 25% of the time. May be withdrawn or combative.  Problem Solving Problem Solving: 1-Solves basic less than 25% of the time - needs direction nearly all the time or does not effectively solve problems and may need a restraint for safety  Memory Memory: 1-Recognizes or recalls less than 25% of the time/requires cueing greater than 75% of the time  Medical Problem List and Plan:  1. DVT Prophylaxis/Anticoagulation: Pharmaceutical: Lovenox  2. Pain Management: Prn oxycodone for outward signs of pain.  3. Mood: Currently too low level to evaluate.  4. Neuropsych: This patient is not capable of making decisions on his own behalf.   -increased amantadine  -more direct, but limited, interaction with staff on very basic level 5. Recurrent Fever: afebrile  white count declining, recheck   -wound culture with MRSA--  -PEG site cultured negative  -left leg recultured--results pending. --resume bactrim  -local wound care. Left shin looks fine 6. Left tibial plateau Fx--s/p ORIF: NWB with  ROM only with PT. Xray shows healing. No signs of osteo 7. Left humerus Fx--s/p ORIF: NWB- may be out of splint with therapy  -HO and  Likely MO surrounding fracture and injury site---Range as tolerated 8. Leucocytosis: improving 9. Left shin and heel wound: Continue bilateral  PRAFO. Pressure relief measures-- WOC assistance appreciated 10. ABLA: hgb rechecks 11. Dysphagia: Continue TF for supplement. Continue D1, honey liquids with assistance.  12. Agitation: seroquel- decreased to hs only     ativan prn. [place] bed for safety--still required  LOS (Days) 22 A FACE TO FACE EVALUATION WAS PERFORMED  Drucilla Cumber T 10/05/2013 8:23 AM

## 2013-10-05 NOTE — Progress Notes (Signed)
Physical Therapy Weekly Progress Note  Patient Details  Name: Cory [name] MRN: [id number] Date of Birth: August 16, 1981  Today's Date: 10/05/2013 Time: 4098-1191 Time Calculation (min): 60 min  Patient has met 3 of 3 short term goals.  Patient has made small, inconsistent gains this reporting period due to improvements in visual tracking, focused attention, and command following. Currently, patient is demonstrating behaviors consistent of a Rancho Level III with emerging behaviors of a Rancho Level IV. Patient demonstrates increased ability to track to midline, localize to noxious stimuli, localize a response to auditory stimuli in B fields of environment and inconsistently utilizing head nods to answer yes/no questions. Patient continues to demonstrate decreased ability to consistently answers yes/no questions with a head nod. Patient is demonstrating increased initiation with wants (removing knee immobilizer, assisting with doffing of splint, scratching face, attempting to remove H strap, etc.). Additionally, patient is becoming less tolerable of hands on facilitation during mobility and self-care. Patient still requires total A +2 for all functional mobility tasks. PT is continuing to doff L UE splint and L knee immobilizer and range UE/LE in all planes as well as address skin integrity of L UE/LE.  Patient continues to demonstrate the following deficits: muscle weakness, decreased cardiorespiratoy endurance, impaired timing and sequencing, abnormal tone, unbalanced muscle activation, motor apraxia, decreased motor planning, decreased visual perceptual skills and decreased visual motor skills, decreased midline orientation, decreased attention to right, decreased initiation, decreased problem solving, decreased safety awareness, decreased memory and delayed processing and decreased sitting balance, decreased standing balance, hemiplegia, decreased balance strategies, decreased activity tolerance,  decreased balance, poor postural control, decreased ability to compensate for deficits, decreased functional use of all LEs, poor attention, poor awareness, decreased coordination, decreased knowledge of precautions, and therefore will continue to benefit from skilled PT intervention to enhance overall performance with activity tolerance, balance, postural control, ability to compensate for deficits, functional use of  right upper extremity, right lower extremity, left upper extremity and left lower extremity, attention, awareness, coordination and knowledge of precautions.  Patient progressing toward long term goals..  Continue plan of care.  PT Short Term Goals Week 1:  PT Short Term Goal 1 (Week 1): Pt will participate 10% with functional transfer PT Short Term Goal 1 - Progress (Week 1): Not met PT Short Term Goal 2 (Week 1): Pt will demo focused attention to functional activity  x 10 seconds with max A PT Short Term Goal 2 - Progress (Week 1): Not met PT Short Term Goal 3 (Week 1): Pt will maintain static seated balance for therapeutic activity x 30 seconds with pt assisting 25% PT Short Term Goal 3 - Progress (Week 1): Not met Week 2:  PT Short Term Goal 1 (Week 2): Patient will demonstrate a localized response in right field with audiotory stimulus 75% of the time with max cuing PT Short Term Goal 1 - Progress (Week 2): Met PT Short Term Goal 2 (Week 2): Pt will demonstrate focused attention for 3 seconds with max cuing for in preparation for bed<>wheelchair transfer. PT Short Term Goal 2 - Progress (Week 2): Met PT Short Term Goal 3 (Week 2): Pt will visually track a functional object 50% of opportunities with max cuing PT Short Term Goal 3 - Progress (Week 2): Met Week 3:  PT Short Term Goal 1 (Week 3): Patient will demonstrate sustained attention x 20" in a minimally distractive environment. PT Short Term Goal 2 (Week 3): Patient will demonstrate functional object use while sitting  edge  of bed on 2 of 3 occasions. PT Short Term Goal 3 (Week 3): Patient will follow one step commands 3/3 trials while sitting edge of bed.  Skilled Therapeutic Interventions/Progress Updates:    Patient received supine in bed. Patient participated in functional self care tasks. 2 person lift transfer bed>wheelchair. Initiated doffing of ace wrap on top of L UE splint and with commands, patient able to unwrap ace wrap and remove splint with mod verbal cues to continue activity until completion. Attempted to have patient doff L knee immobilizer, however patient does not engage in this activity after repeated verbal cues and increased time allowed. Patient participated in bathing B UEs and LEs with cloth, demonstrating oral reflex of bringing cloth to his mouth x3, but easily redirected to task. Patient washed face without verbal cues to do so without demonstrating oral reflex. Patient participated in rubbing lotion on B UEs and B LEs for 15 sec with min A before terminating task and therapist completed with patient allowing care to be given. Patient performed ROM in all planes with L UE. Patient combed hair, initially brought comb to mouth, but with [facility] redirection, patient able to comb hair x2 trials with max verbal cues. Patient provided with magic cup and spoon, patient holds magic cup in L hand and uses spoon to self feed with R hand x6'. Patient able to attend likely due to oral reflex activity. Patient left seated in wheelchair with H strap for safety at RN station.  Therapy Documentation Precautions:  Precautions Precautions: Fall Precaution Comments: peg with abdominal binder, LUE splint, and L LE KI.   Required Braces or Orthoses: Knee Immobilizer - Left Knee Immobilizer - Left: On at all times Other Brace/Splint: Bil PRAFOs and L UE and LE splint can be removed for ROM with Ot/PT only.   Restrictions Weight Bearing Restrictions: Yes LUE Weight Bearing: Non weight bearing LLE Weight Bearing: Non  weight bearing  See FIM for current functional status  Therapy/Group: Individual Therapy  Chipper Herb. Tira Lafferty, PT, DPT 10/05/2013, 2:30 PM

## 2013-10-06 ENCOUNTER — Inpatient Hospital Stay (HOSPITAL_COMMUNITY): Payer: Medicaid Other | Admitting: *Deleted

## 2013-10-06 ENCOUNTER — Inpatient Hospital Stay (HOSPITAL_COMMUNITY): Payer: Medicaid - Out of State | Admitting: Occupational Therapy

## 2013-10-06 ENCOUNTER — Inpatient Hospital Stay (HOSPITAL_COMMUNITY): Payer: Medicaid Other | Admitting: Speech Pathology

## 2013-10-06 LAB — OCCULT BLOOD X 1 CARD TO LAB, STOOL: Fecal Occult Bld: NEGATIVE

## 2013-10-06 LAB — WOUND CULTURE

## 2013-10-06 MED ORDER — VITAL 1.5 CAL PO LIQD
237.0000 mL | ORAL | Status: DC
  Administered 2013-10-06: 237 mL
  Filled 2013-10-06 (×9): qty 237

## 2013-10-06 MED ORDER — VITAL 1.5 CAL PO LIQD
237.0000 mL | Freq: Three times a day (TID) | ORAL | Status: DC | PRN
  Filled 2013-10-06 (×3): qty 237
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
## 2013-10-06 NOTE — Progress Notes (Signed)
Speech Language Pathology Daily Session Note  Patient Details  Name: Cory [name] MRN: [id number] Date of Birth: 1981-03-27  Today's Date: 10/06/2013 Time: 1400-1445 Time Calculation (min): 45 min  Short Term Goals: Week 4: SLP Short Term Goal 1 (Week 4): Pt will respond to yes/no questions in regards to biographical information with gestures/verbalizations with 50% of opportunities SLP Short Term Goal 2 (Week 4): Pt will follow 1 step commands with 75% of opportunities with Mod A multimodal cueing  SLP Short Term Goal 3 (Week 4): Pt will demonstrate sustained attention during a functioanl task for 30 minutes with Mod A verbal cues for redirection  SLP Short Term Goal 4 (Week 4): pt will initiate functional tasks with 50% of opportunities with Mod A multimodal cueing  SLP Short Term Goal 5 (Week 4): Pt will consume Dys. 1 textures with honey-thick liquids with minimal overt s/s of aspiration with Max A verbal cues for utilization of small bites/sips.   Skilled Therapeutic Interventions: Treatment focus on cognitive-linguistic goals. SLP facilitated session by providing Max-Total A multimodal cueing for verbal/nonverbal responses to basic yes/no questions. Pt was nonverbal throughout the session but demonstrated groping with demonstration cues for articulatory placement for "yes" X 3. Pt had increased nonverbal responses with head nods and shook his head both "yes" and "no" in response to basic questions (about 25% of opportunities). Pt also chose his name from a field of two with 100% accuracy and extra time. Pt appeared restless throughout the session and was constantly shaking his R LE and placing it in clinician's lap/face.  He was also trying to utilize paper, etc. to wrap his leg. Pt's leg shaking seemed to calm when a pillow and foot rest was put in place. RN made aware and pt was left at nurses station to be placed back to bed. Continue plan of care.    FIM:   Comprehension Comprehension Mode: Auditory Comprehension: 1-Understands basic less than 25% of the time/requires cueing 75% of the time Expression Expression Mode: Nonverbal Expression: 1-Expresses basis less than 25% of the time/requires cueing greater than 75% of the time. Social Interaction Social Interaction: 1-Interacts appropriately less than 25% of the time. May be withdrawn or combative. Problem Solving Problem Solving: 1-Solves basic less than 25% of the time - needs direction nearly all the time or does not effectively solve problems and may need a restraint for safety Memory Memory: 1-Recognizes or recalls less than 25% of the time/requires cueing greater than 75% of the time  Pain Pt appeared to be in pain due to restlessness and constant shaking/grabbing of his R LE. Pt repositioned and RN made aware.   Therapy/Group: Individual Therapy  Carena Stream 10/06/2013, 3:21 PM

## 2013-10-06 NOTE — Progress Notes (Signed)
Physical Therapy Session Note  Patient Details  Name: Cory [name] MRN: [id number] Date of Birth: 1981-08-13  Today's Date: 10/06/2013 Time: 1478-2956 and 2130-8657 Time Calculation (min): 60 min and 45 min  Short Term Goals: Week 3:  PT Short Term Goal 1 (Week 3): Patient will demonstrate sustained attention x 20" in a minimally distractive environment. PT Short Term Goal 2 (Week 3): Patient will demonstrate functional object use while sitting edge of bed on 2 of 3 occasions. PT Short Term Goal 3 (Week 3): Patient will follow one step commands 3/3 trials while sitting edge of bed.  Skilled Therapeutic Interventions/Progress Updates:    AM Session: Patient received supine in bed. Patient participated in functional self care tasks. 2 person lift transfer bed>wheelchair. Initiated doffing of ace wrap on top of L UE splint and with commands, patient able to unwrap ace wrap and remove splint with mod verbal cues to continue activity until completion. Attempted to have patient doff L knee immobilizer, however patient does not engage in this activity after repeated verbal cues and increased time allowed. Patient participated in bathing B UEs with cloth, demonstrating oral reflex of bringing cloth to his mouth x2, but easily redirected to task. Patient requires max cues and HOH assist to wash face. Patient performed ROM in all planes with L UE. Patient combed hair, initially brought comb to mouth, but with [facility] redirection, patient able to comb hair x2 trials with max verbal cues. In gym, patient engaged in tossing football, able to visually track ball in L visual field to approx midline, does not attend to ball or track from R visual field. When tactile and verbal cues provided for hands out to catch ball, patient able to maintain until ball tossed to him. Patient also participated in putting rings on pegs, able to follow commands to place rings 100%, but requires cue to do so each time. At end of  session, patient performs elaborate hand shake with therapist with R hand. Patient left seated in wheelchair at RN station with H strap donned.  PM Session: Patient received supine in bed. Session focused on rolling, supine<>sit, and sit<>stand transfers. 2 person lift transfer bed>wheelchair>mat>wheelchair. While on mat, patient engaged in rolling, repeatedly readjusting pillow to be under his head. Patient less engaged during this session and appearing tired, repeatedly rolling away from therapist and maintaining eyes closed while resting/staying still. Patient able to transfer supine>sit on command and swing LEs off edge of mat when commanded. Patient performed x3 sit<>stand from EOM with modA, +2 present for safety. Patient only able to maintain standing x10" each, no apparent attempts to walk. Patient continues to roll away from therapist and close eyes. In wheelchair at end of session, patient able to remove shoulder straps of H strap, but able to remain safely positioned in wheelchair with hip strap in place and wheelchair posteriorly tilted. Patient shakes hands with therapist with R and then L hand.  Therapy Documentation Precautions:  Precautions Precautions: Fall Precaution Comments: peg with abdominal binder, LUE splint, and L LE KI.   Required Braces or Orthoses: Knee Immobilizer - Left Knee Immobilizer - Left: On at all times Other Brace/Splint: Bil PRAFOs and L UE and LE splint can be removed for ROM with Ot/PT only.   Restrictions Weight Bearing Restrictions: Yes LUE Weight Bearing: Non weight bearing LLE Weight Bearing: Non weight bearing  See FIM for current functional status  Therapy/Group: Individual Therapy  Chipper Herb. Dionisio Aragones, PT, DPT  10/06/2013,  12:35 PM

## 2013-10-06 NOTE — Progress Notes (Signed)
Patient ID: Cory [name], male   DOB: Aug 26, 1981, 32 y.o.   MRN: [id number] i have reviewed fils and believe patient may be out of splint on left arm and may be out knee immobilizer in bed .  Pt should have wound care to l leg wound and be in knee immobilizer when oob in wheelchair. If patient trying to stand or walk needs knee immobilizer on and should be 50% wtbearing only and if not able to tolerate then non-wt bearing.  Available if needed [name] [name]

## 2013-10-06 NOTE — Progress Notes (Signed)
Occupational Therapy Session Note  Patient Details  Name: Cory [name] MRN: [id number] Date of Birth: Jan 03, 1981  Today's Date: 10/06/2013 Time: 1000-1045 Time Calculation (min): 45 min  Short Term Goals:  Week 4:  OT Short Term Goal 1 (Week 4): Pt will  demosntrate sustation attention to self feeding task for 3 min with mod A  OT Short Term Goal 2 (Week 4): Pt will follow one step simple self care task with mod A  OT Short Term Goal 3 (Week 4): Pt will perform 1/2 grooming tasks with mod  A for initiation  OT Short Term Goal 4 (Week 4): Pt will assist with transfer 20% of the time.  Skilled Therapeutic Interventions/Progress Updates:    1:1 focus on participation and initiation in functional self care tasks including, tooth brushing, washing face, arms, and thighs, donning deodorant, and donning shirt. Pt with timely initiation of taking the item from clinician however required tactile demonstrative cuing with grooming items due to his initial response to put the item in his mouth. Once A with purpose of task pt able to complete task quickly (not thoroughly). Pt continues to use bilateral UE together to perform tasks (without splint on) as well as track in all directions with extra time. Discussed tube feeding times with time of meal attempts with nursing, SLP and nutrition.   Therapy Documentation Precautions:  Precautions Precautions: Fall Precaution Comments: peg with abdominal binder, LUE splint, and L LE KI.   Required Braces or Orthoses: Knee Immobilizer - Left Knee Immobilizer - Left: On at all times Other Brace/Splint: Bil PRAFOs and L UE and LE splint can be removed for ROM with Ot/PT only.   Restrictions Weight Bearing Restrictions: Yes LUE Weight Bearing: Non weight bearing LLE Weight Bearing: Non weight bearing Pain:  no signs or symptoms of pain in session; did show signs of fatigue today  See FIM for current functional status  Therapy/Group: Individual  Therapy  [name] [facility] 10/06/2013, 2:54 PM
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
## 2013-10-06 NOTE — Progress Notes (Signed)
Received a call from the lab with positive results for MRSA on LT. Foot wound.MD. was notified.Keep monitoring pt. Closely and assessing his needs.

## 2013-10-07 ENCOUNTER — Ambulatory Visit (HOSPITAL_COMMUNITY): Payer: Medicaid - Out of State | Admitting: Occupational Therapy

## 2013-10-07 DIAGNOSIS — IMO0002 Reserved for concepts with insufficient information to code with codable children: Secondary | ICD-10-CM

## 2013-10-07 DIAGNOSIS — I69991 Dysphagia following unspecified cerebrovascular disease: Secondary | ICD-10-CM

## 2013-10-07 DIAGNOSIS — S069X9A Unspecified intracranial injury with loss of consciousness of unspecified duration, initial encounter: Secondary | ICD-10-CM

## 2013-10-07 NOTE — Progress Notes (Signed)
Patient pulled out peg tube. Three people to handle . Eating well . Per md will not put tube back in. No food for 4 hrs 4x4 and tape to cover

## 2013-10-07 NOTE — Progress Notes (Signed)
Physical Therapy Session Note  Patient Details  Name: Cory [name] MRN: [id number] Date of Birth: 19-Mar-1981  Today's Date: 10/07/2013 Time: 1030-1100 Time Calculation (min): 30 min  Skilled Therapeutic Interventions/Progress Updates:  Co-tx w/ Occupational Therapy. Focus this session on following one-step commands to come to EOB, stand pivot bed<>regular w/c<>tx mat and short distance ambulation w/ Ax2persons. Pt demonstrated increased cognitive and purposeful motor during basic ADLs including donning shirt and using towel to dry hands, however, decreased tolerance to washing hands. Pt w/ good tolerance to sitting in regular w/c intermittently throughout tx session w/out use of H strap. Pt able to amb three musketeer style in therapy gym 3x15' tx mat<>tx mat. Pt naturally demonstrating decreased WB through L LE, however, therapist facilitating additional decreased WB to follow 50% WB precaution. Gait characterized by gross advancement of B LE w/ variable BOS and supination of R foot w/ majority of WB through posterior lateral heel. Pt gradually demonstrating improved tolerance to WB through B LE w/ each attempt and returning to supine position after every bout of amb. Uncertain if decreased tolerance relating to fatigue, frustration, sensation, upright posture or BP. Unable to obtain BP 2/2 decreased tolerance of BP cuff. Pt sitting in tilt-in-space w/c w/ H strap in place at end of session, at nuses station.   Therapy Documentation Precautions:  Precautions Precautions: Fall Precaution Comments: peg with abdominal binder, LUE splint, and L LE KI.   Required Braces or Orthoses: Knee Immobilizer - Left Knee Immobilizer - Left: On at all times Other Brace/Splint: Bil PRAFOs and L UE and LE splint can be removed for ROM with Ot/PT only.   Restrictions Weight Bearing Restrictions: Yes LUE Weight Bearing: Non weight bearing LLE Weight Bearing: Partial weight bearing LLE Partial Weight Bearing  Percentage or Pounds: 50%  See FIM for current functional status  Therapy/Group: Co-Treatment  Zerita Boers S 10/07/2013, 1:14 PM
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
## 2013-10-07 NOTE — Progress Notes (Addendum)
Occupational Therapy Session Note  Patient Details  Name: Cory [name] MRN: [id number] Date of Birth: Sep 30, 1981  Today's Date: 10/07/2013 Time: 1000-1030 Time Calculation (min): 30 min  Short Term Goals: Week 4:  OT Short Term Goal 1 (Week 4): Pt will  demosntrate sustation attention to self feeding task for 3 min with mod A  OT Short Term Goal 2 (Week 4): Pt will follow one step simple self care task with mod A  OT Short Term Goal 3 (Week 4): Pt will perform 1/2 grooming tasks with mod  A for initiation  OT Short Term Goal 4 (Week 4): Pt will assist with transfer 20% of the time.  Skilled Therapeutic Interventions/Progress Updates:    Co treat with physical therapy: focus on following one step commands to come to EOB, stand pivot with +2 for safety into regular w/c. Left UE splint doffed. Pt assisted donning sweatshirt and underwear over brief demonstrating increased  cognitive and purposeful motor initiation. Attempted to have pt wash hands at sink but didn't not tolerate- pulling hands back and using towel to dry them. Pt tolerated sitting in regular w/c without H - strap and being pushed to gym. Pt performed short distance functional ambulation in three musketeer style from one mat in the gym to the next 3 times. Pt noted to gross supinate and posterior weight bearing through heel on right foot (less the third time). The first two times pt with decreased tolerance (due to ? Fatigue, frustration, sensation, upright posture or BP); as seen by decreased bilateral weight bearing through LEs half way and desiring to lay down on mat on pillow after each trial. Attempted to take BP after first ambulation however pt didn't tolerate cuff and took it off.  Pt still remained nonverbal in session; however pt with increased attempts to mouth words. Pt transitioned back into tilt in space w/c and left an RN station.   Therapy Documentation Precautions:  Precautions Precautions: Fall Precaution  Comments: peg with abdominal binder, LUE splint, and L LE KI.   Required Braces or Orthoses: Knee Immobilizer - Left Knee Immobilizer - Left: On at all times Other Brace/Splint: Bil PRAFOs and L UE and LE splint can be removed for ROM with Ot/PT only.   Restrictions Weight Bearing Restrictions: Yes LUE Weight Bearing: Non weight bearing LLE Weight Bearing: 50%  weight bearing Pain:  in indications of pain   See FIM for current functional status  Therapy/Group: Individual Therapy and Co-Treatment  [name] [facility] 10/07/2013, 12:37 PM

## 2013-10-08 MED ORDER — PROPRANOLOL HCL 20 MG PO TABS
20.0000 mg | ORAL_TABLET | Freq: Three times a day (TID) | ORAL | Status: DC
  Administered 2013-10-08 – 2013-10-11 (×11): 20 mg via ORAL
  Filled 2013-10-08 (×16): qty 1

## 2013-10-08 MED ORDER — AMANTADINE HCL 50 MG/5ML PO SYRP
100.0000 mg | ORAL_SOLUTION | Freq: Two times a day (BID) | ORAL | Status: DC
  Administered 2013-10-08 – 2013-10-09 (×2): 100 mg via ORAL
  Filled 2013-10-08 (×4): qty 10

## 2013-10-08 MED ORDER — SERTRALINE HCL 50 MG PO TABS
50.0000 mg | ORAL_TABLET | Freq: Every day | ORAL | Status: DC
  Administered 2013-10-08 – 2013-11-08 (×32): 50 mg via ORAL
  Filled 2013-10-08 (×34): qty 1
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
## 2013-10-08 NOTE — Progress Notes (Signed)
Restless no eye contact,tries to bite anything in path. Eats well. Medications in pudding no problem. Resists getting up in chair.  Three people to transfer today.  Will not leave dressing on leg.

## 2013-10-09 ENCOUNTER — Inpatient Hospital Stay (HOSPITAL_COMMUNITY): Payer: Medicaid Other | Admitting: Speech Pathology

## 2013-10-09 ENCOUNTER — Inpatient Hospital Stay (HOSPITAL_COMMUNITY): Payer: Medicaid - Out of State | Admitting: Occupational Therapy

## 2013-10-09 ENCOUNTER — Inpatient Hospital Stay (HOSPITAL_COMMUNITY): Payer: Medicaid Other | Admitting: *Deleted

## 2013-10-09 DIAGNOSIS — IMO0002 Reserved for concepts with insufficient information to code with codable children: Secondary | ICD-10-CM

## 2013-10-09 DIAGNOSIS — S069X9A Unspecified intracranial injury with loss of consciousness of unspecified duration, initial encounter: Secondary | ICD-10-CM

## 2013-10-09 DIAGNOSIS — I69991 Dysphagia following unspecified cerebrovascular disease: Secondary | ICD-10-CM

## 2013-10-09 MED ORDER — BOOST / RESOURCE BREEZE PO LIQD
1.0000 | Freq: Three times a day (TID) | ORAL | Status: DC
  Administered 2013-10-09 – 2013-11-08 (×82): 1 via ORAL
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
## 2013-10-09 NOTE — Progress Notes (Signed)
Speech Language Pathology Daily Session Notes  Patient Details  Name: Cory [name] MRN: [id number] Date of Birth: June 17, 1981  Today's Date: 10/09/2013  Session 1 Time: 1100-1130 Time Calculation (min): 30 min  Session 2 Time: 1400-1430 Time Calculation: 30 min  Short Term Goals: Week 4: SLP Short Term Goal 1 (Week 4): Pt will respond to yes/no questions in regards to biographical information with gestures/verbalizations with 50% of opportunities SLP Short Term Goal 2 (Week 4): Pt will follow 1 step commands with 75% of opportunities with Mod A multimodal cueing  SLP Short Term Goal 3 (Week 4): Pt will demonstrate sustained attention during a functioanl task for 30 minutes with Mod A verbal cues for redirection  SLP Short Term Goal 4 (Week 4): pt will initiate functional tasks with 50% of opportunities with Mod A multimodal cueing  SLP Short Term Goal 5 (Week 4): Pt will consume Dys. 1 textures with honey-thick liquids with minimal overt s/s of aspiration with Max A verbal cues for utilization of small bites/sips.   Skilled Therapeutic Interventions:  Session 1: Treatment focus on cognitive and dysphagia goals. Upon arrival, pt asleep in enclosure bed. Pt was easily aroused and transferred to wheelchair with soft waist belt.  SLP facilitated session by providing basic yes/no question in regards to wants/needs.  Pt answered questions with head nods in 50% of opportunities but was accurate with 25% of answers. Pt consumed honey-thick liquids via cup without overt s/s of aspiration  but required Max cueing for small sips to and to self-monitor and correct anterior spillage. Pt appeared restless toward end of session and was constantly removing his shirt/gown and attempting to remove soft waist belt.  Pt transferred back to enclosure bed.  Continue plan of care.    Session 2: Treatment focus on cognitive goals. SLP facilitated session by providing Max A multimodal cueing for utilization of  small sips with honey-thick liquids via cup without overt s/s of aspiration. SLP also required total A for sorting from a field of two and for focused attention to cognitive tasks. Pt consistently pushing himself away from the table, throwing the cards, and removing his gown. Pt is demonstrating behaviors consistent with a Rancho Level IV. Pt let with RN for dressing changes. Continue plan of care.   FIM:  Comprehension Comprehension Mode: Auditory Comprehension: 1-Understands basic less than 25% of the time/requires cueing 75% of the time Expression Expression Mode: Nonverbal Expression: 1-Expresses basis less than 25% of the time/requires cueing greater than 75% of the time. Social Interaction Social Interaction: 1-Interacts appropriately less than 25% of the time. May be withdrawn or combative. Problem Solving Problem Solving: 1-Solves basic less than 25% of the time - needs direction nearly all the time or does not effectively solve problems and may need a restraint for safety Memory Memory: 1-Recognizes or recalls less than 25% of the time/requires cueing greater than 75% of the time  Pain No s/s of pain   Therapy/Group: Individual Therapy  Naji Mehringer 10/09/2013, 12:06 PM

## 2013-10-09 NOTE — Progress Notes (Addendum)
Physical Therapy Session Note  Patient Details  Name: Cory [name] MRN: [id number] Date of Birth: 19-Sep-1981  Today's Date: 10/09/2013 Time: 6962-9528 and 1305-1400 Time Calculation (min): 45 min and 55 min  Short Term Goals: Week 3:  PT Short Term Goal 1 (Week 3): Patient will demonstrate sustained attention x 20" in a minimally distractive environment. PT Short Term Goal 2 (Week 3): Patient will demonstrate functional object use while sitting edge of bed on 2 of 3 occasions. PT Short Term Goal 3 (Week 3): Patient will follow one step commands 3/3 trials while sitting edge of bed.  Skilled Therapeutic Interventions/Progress Updates:    AM Session: This session focused on following one-step commands with all functional mobility, emphasis on LE management on leg rests, scooting forward/backwards in wheelchair, stand pivot transfers, and short distance ambulation +2 assist. Patient switched to regular 18x16 wheelchair with supportive back and posey belt for safety. Patient continues to demonstrate increased purposeful movements during session. Additionally, patient follows 100% of one step commands with regards to lifting LEs onto leg rests, scooting back in wheelchair. Patient with good tolerance to sitting in regular wheelchair for entire session without use of restraint. Patient ambulates three musketeer style 1x18'. Patient with decreased WB through L LE, therapist facilitates additional decreased WB to maintain 50% WB precaution. Patient demonstrates exaggerated advancement of B LE with narrow BOS and supination of B feet w/ majority of WB through posterior lateral heel (due to sensory input?). Attempted ambulation two more trials: on one attempt, patient takes 2 steps then actively sits down; on another attempt, patient stands, but immediately returns to sitting in wheelchair. Uncertain if decreased tolerance relating to fatigue, frustration, sensation, or upright posture. Throughout session,  patient demonstrating more purposeful movements, repeatedly rubbing eyes and head, removing therapist's hands during some aspects of facilitation; also attempting to doff shirt several times. Patient now demonstrating behaviors consistent with Rancho Level IV. Patient left sitting in regular wheelchair with posey belt for safety at RN station.  PM Session: Patient receive supine in bed. Session focused on increasing tolerance to sitting in wheelchair, following one-step commands with functional mobility, and automatic response to stimuli. Patient incontinent of bowel upon arrival, requires +2 assist for hygiene. Patient able to follow 75% of one step commands with regards to rolling, sitting up, laying down, to assist with clean up. Patient performs stand pivot transfer bed>wheelchair, modA, second person present for safety, but not necessary for lifting/lowering. In therapy gym, assessed patient's automatic response to stimuli: throwing ball at patient, rolling ball to patient, etc. Patient unable to coordinate catching ball with B hands, but attempts to hit it back to therapist when it is coming towards him. When placed in his lap, able to use B hands to throw. Patient unable to coordinate kicking ball when it is rolled to him, but does flex trunk/hips to pick ball up off of floor one time. Patient perseverating entire session on removing gown, using teeth to assist him to do so. Towards end of session, patient turning head away from therapist and closing eyes. Patient left sitting in regular wheelchair with posey belt for safety at RN station.   Therapy Documentation Precautions:  Precautions Precautions: Fall Precaution Comments: peg with abdominal binder, LUE splint, and L LE KI.   Required Braces or Orthoses: Knee Immobilizer - Left Knee Immobilizer - Left: On at all times Other Brace/Splint: Bil PRAFOs and L UE and LE splint can be removed for ROM with Ot/PT only.  Restrictions Weight Bearing  Restrictions: Yes LUE Weight Bearing: Non weight bearing LLE Weight Bearing: Touchdown weight bearing LLE Partial Weight Bearing Percentage or Pounds: 50% Locomotion : Ambulation Ambulation/Gait Assistance: 1: +2 Total assist   See FIM for current functional status  Therapy/Group: Individual Therapy  Chipper Herb. Johncharles Fusselman, PT, DPT 10/09/2013, 2:59 PM

## 2013-10-09 NOTE — Progress Notes (Signed)
Occupational Therapy Session Note  Patient Details  Name: Cory [name] MRN: [id number] Date of Birth: 06/16/1981  Today's Date: 10/09/2013 Time: 4098-1191 Time Calculation (min): 40 min  Short Term Goals: Week 4:  OT Short Term Goal 1 (Week 4): Pt will  demosntrate sustation attention to self feeding task for 3 min with mod A  OT Short Term Goal 2 (Week 4): Pt will follow one step simple self care task with mod A  OT Short Term Goal 3 (Week 4): Pt will perform 1/2 grooming tasks with mod  A for initiation  OT Short Term Goal 4 (Week 4): Pt will assist with transfer 20% of the time.  Skilled Therapeutic Interventions/Progress Updates:    1:1 Pt tolerating sitting in regular w/c for entire session. Assisted RN with taking meds PO and taking BP. Pt resistant to tightening BP cuff but with calming voice and holding his hand gently pt allowed task to be completed. Trial for catching and throwing ball looking for automatic responses; pt performed 2/6 times. Then closed his eyes and stopped engaging. Pt with increased fatigue with body very calmed. Pt assisted back to bed with mod A stand pivot transfer of +1 person.   Therapy Documentation Precautions:  Precautions Precautions: Fall Precaution Comments: peg with abdominal binder, LUE splint, and L LE KI.   Required Braces or Orthoses: Knee Immobilizer - Left Knee Immobilizer - Left: On at all times Other Brace/Splint: Bil PRAFOs and L UE and LE splint can be removed for ROM with Ot/PT only.   Restrictions Weight Bearing Restrictions: Yes LUE Weight Bearing: Non weight bearing LLE Weight Bearing: Non weight bearing LLE Partial Weight Bearing Percentage or Pounds: 50% Pain:  no indications of pain   See FIM for current functional status  Therapy/Group: Individual Therapy  [name] [facility] 10/09/2013, 11:13 AM

## 2013-10-09 NOTE — Progress Notes (Signed)
Subjective/Complaints:  Intake is good for calories and fluids Remains semi purposeful A  review of systems has been performed and if not noted above is otherwise negative. .  Objective: Vital Signs: Blood pressure 133/75, pulse 105, temperature 97.3 F (36.3 C), temperature source Axillary, resp. rate 20, height 5' 2.99" (1.6 m), weight 48 kg (105 lb 13.1 oz), SpO2 95.00%. No results found. No results found for this basename: WBC, HGB, HCT, PLT,  in the last 72 hours No results found for this basename: NA, K, CL, CO, GLUCOSE, BUN, CREATININE, CALCIUM,  in the last 72 hours CBG (last 3)  No results found for this basename: GLUCAP,  in the last 72 hours  Wt Readings from Last 3 Encounters:  10/08/13 48 kg (105 lb 13.1 oz)  09/13/13 46.811 kg (103 lb 3.2 oz)  09/13/13 46.811 kg (103 lb 3.2 oz)    Physical Exam:  Constitutional:      Lethargic to restless, alternating, supine to sit as well as dual leg lifts,  Eyes: Right eye exhibits no discharge. Left eye exhibits no discharge.  Neck:  Janina Mayo out , stoma closed Cardiovascular: Normal rate and regular rhythm.  Respiratory: Effort normal.  GI: Soft. Stoma with scab no drainage No tenderness He has no cervical adenopathy.  Neurological:    Awake,distracted. Not following commands. Will make brief eye contact with me but attention is only a few secs Musc:  Left arm flexes and extends no pain over healed incision  Skin: no drainage around G/J tube .   Assessment/Plan: 1. 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 Physiatrist is providing close team supervision and 24 hour management of active medical problems listed below. Physiatrist and rehab team continue to assess barriers to discharge/monitor patient progress toward functional and medical goals.       FIM: FIM - Bathing Bathing Steps Patient Completed:   (patient is scheduled for night bathing) Bathing: 0: Activity did not occur (did not occur with therapies)  FIM - Upper Body Dressing/Undressing Upper body dressing/undressing: 0: Wears gown/pajamas-no public clothing FIM - Lower Body Dressing/Undressing Lower body dressing/undressing: 0: Wears Oceanographer  FIM - Toileting Toileting: 0: No continent bowel/bladder events this shift  FIM - Archivist Transfers: 0-Activity did not occur  FIM - Games developer Transfer: 1: Two helpers  FIM - Locomotion: Wheelchair Locomotion: Wheelchair: 1: Total Assistance/staff pushes wheelchair (Pt<25%) FIM - Locomotion: Ambulation Ambulation/Gait Assistance: Not tested (comment) Locomotion: Ambulation: 1: Two helpers  Comprehension Comprehension Mode: Auditory Comprehension: 1-Understands basic less than 25% of the time/requires cueing 75% of the time  Expression Expression Mode: Nonverbal Expression: 1-Expresses basis less than 25% of the time/requires cueing greater than 75% of the time.  Social Interaction Social Interaction: 1-Interacts appropriately less than 25% of the time. May be withdrawn or combative.  Problem Solving Problem Solving: 1-Solves basic less than 25% of the time - needs direction nearly all the time or does not effectively solve problems and may need a restraint for safety  Memory Memory: 1-Recognizes or recalls less than 25% of the time/requires cueing greater than 75% of the time  Medical Problem List and Plan:  1. DVT Prophylaxis/Anticoagulation: Pharmaceutical: Lovenox  2. Pain Management: Prn oxycodone for outward signs of pain.  3. Mood: Currently too low level to evaluate.  4. Neuropsych: This patient is not capable of making decisions on his own behalf.   -  rD/C amantadine  -more direct, but limited, interaction with staff on very basic level 5. Recurrent Fever: resolved 6. Left tibial plateau Fx--s/p ORIF:  appreciate ortho f/u 7. Left humerus Fx--s/p ORIF: appreciate ortho f/u  8. Leucocytosis: improving 9. Left shin and heel wound: Continue bilateral PRAFO. Pressure relief measures-- WOC assistance appreciated 10. ABLA: hgb rechecks 11. Dysphagia: Continue TF for supplement. Continue D1, honey liquids with assistance.  12. Agitation: seroquel- decreased to hs only, will increase inderal.  Stop trazodone and start zoloft, reduce symmetrel     ativan prn. [place] bed for safety--still required  LOS (Days) 26 A FACE TO FACE EVALUATION WAS PERFORMED  Cory [name] E 10/09/2013 8:09 AM

## 2013-10-10 ENCOUNTER — Inpatient Hospital Stay (HOSPITAL_COMMUNITY): Payer: Medicaid Other | Admitting: *Deleted

## 2013-10-10 ENCOUNTER — Encounter (HOSPITAL_COMMUNITY): Payer: Medicaid - Out of State | Admitting: Occupational Therapy

## 2013-10-10 ENCOUNTER — Inpatient Hospital Stay (HOSPITAL_COMMUNITY): Payer: Medicaid Other | Admitting: Speech Pathology

## 2013-10-10 NOTE — Progress Notes (Signed)
Occupational Therapy Session Note  Patient Details  Name: Gwen Edler MRN: [id number] Date of Birth: 13-Jan-1981  Today's Date: 10/10/2013 Time: 1400-1415 Time Calculation (min): 15 min  Short Term Goals: Week 4:  OT Short Term Goal 1 (Week 4): Pt will  demosntrate sustation attention to self feeding task for 3 min with mod A  OT Short Term Goal 2 (Week 4): Pt will follow one step simple self care task with mod A  OT Short Term Goal 3 (Week 4): Pt will perform 1/2 grooming tasks with mod  A for initiation  OT Short Term Goal 4 (Week 4): Pt will assist with transfer 20% of the time.  Skilled Therapeutic Interventions/Progress Updates:    1:1 cotreat with physical therapy. Pt in bed when arrived and easily aroused. Pt performed squat pivot transfer mod A +2 bed<w/c<>commode with continued focus on introduction of toileting and sitting on toilet. Transitioned to gym for attempts for functional ambulation three musketeers style. 1st attempt not very successful with increased trunk flexion and increased extensor tone- lowering pt to the floor. In between each trial pt very restless on mat rolling around ?? Increased stimulus on bottom of his feet or not feeling good in a upright standing position. Pt with reciprocal LE movement with 2nd and 3rd attempts.   Therapy Documentation Precautions:  Precautions Precautions: Fall Precaution Comments:  L LE KI on during movement, ok to be off in bed and sitting in w/c Required Braces or Orthoses: Knee Immobilizer - Left Knee Immobilizer - Left: On when out of bed or walking Other Brace/Splint: Bil PRAFOs and L UE and LE splint can be removed for ROM with Ot/PT only.   Restrictions Weight Bearing Restrictions: Yes LUE Weight Bearing: Non weight bearing LLE Weight Bearing: Partial weight bearing LLE Partial Weight Bearing Percentage or Pounds: 50% Pain: No indications of pain See FIM for current functional status  Therapy/Group: Individual  Therapy  [name] [facility] 10/10/2013, 3:29 PM

## 2013-10-10 NOTE — Progress Notes (Signed)
Physical Therapy Session Note  Patient Details  Name: Cory [name] MRN: [id number] Date of Birth: July 16, 1981  Today's Date: 10/10/2013 Time: 1000 (cotreat with PT (CK) 1000-1100)-1030 and 1415 (co treat with OT (JLS) [phone] Time Calculation (min): 30 min and 30 min  Short Term Goals: Week 3:  PT Short Term Goal 1 (Week 3): Patient will demonstrate sustained attention x 20" in a minimally distractive environment. PT Short Term Goal 2 (Week 3): Patient will demonstrate functional object use while sitting edge of bed on 2 of 3 occasions. PT Short Term Goal 3 (Week 3): Patient will follow one step commands 3/3 trials while sitting edge of bed.  Skilled Therapeutic Interventions/Progress Updates:    AM Session: Patient received sitting in wheelchair. Session focused on increasing tolerance to sitting in wheelchair, following one-step commands with functional mobility, and automatic response to stimuli. Patient initially attempting to propel wheelchair with use of R LE. Provided patient with R shoe, crosses R LE over L LE to attempt to don shoe. Patient unable to do so and then places shoe on floor and attempts to place R foot in it, requires assist to don completely. With R shoe donned, patient performed wheelchair mobility around unit with maxA for safety and negotiation of obstacles and people, however, patient able to perform ~75% of propulsion. Patient utilizes B UEs to pull on handrails and doorway frames to assist with propulsion. In therapy gym, assessed patient's automatic response to stimuli: throwing ball at patient, rolling ball to patient, etc. Patient appearing to attempt to catch ball with B or R hand, hits ball back to therapist when it is coming towards him, able to throw it back to therapist. Patient able to coordinate kicking ball when it is rolled to him. Additionally, patient able to toss horseshoes at target (even got a ringer!!) when given to him. However, patient  progressing to throwing horseshoes away from target/to the side, possibly indicating desire to terminate activity. Patient taken to bathroom to introduce toilet in prep for timed toileting. Patient only able to sit on toilet 10-15 seconds before extending and attempting to transfer back to wheelchair. Once in wheelchair, patient attempts to transfer back to toilet. Patient performed wheelchair<>toilet transfer x3 with +2 assist. Patient left sitting in regular wheelchair with posey belt for safety at RN station.  PM Session: Patient received supine in bed, easily aroused. Patient performed squat pivot transfer mod A +2 bed>w/c<>commode with continued focus on introduction of toileting and sitting on toilet. Transitioned to gym for attempts for functional ambulation via three musketeers style (patient's B UEs around therapists' shoulders) 10' x3. First trial of gait training, patient demonstrating increased trunk flexion and increased extensor tone of B LEs, not demonstrating any sort of reciprocal movement for functional gait, required to lower patient to floor. Between trials of gait training,  patient very restless on mat rolling to B sides, sitting up, laying back down, etc. Unsure if this restlessness is due to overwhelming stimulus of two therapists with hands on facilitation, increased stimulus/sensation to feet, discomfort in upright position, pain, etc. During second and third trials, patient demonstrating improved reciprocal movement of B LEs and increased attention to task. Patient with one good verbalization s/p gait training. Patient continues to tolerate less hands on facilitation, removing therapists' hands when able. Patient left seated in wheelchair with posey belt for safety.  Patient demonstrating behaviors consistent with Rancho Level IV.  Therapy Documentation Precautions:  Precautions Precautions: Fall Precaution Comments:  L LE KI  on during movement, ok to be off in bed and sitting in  w/c Required Braces or Orthoses: Knee Immobilizer - Left Knee Immobilizer - Left: On when out of bed or walking Restrictions Weight Bearing Restrictions: Yes LUE Weight Bearing: Non weight bearing LLE Weight Bearing: Partial weight bearing LLE Partial Weight Bearing Percentage or Pounds: 50% Locomotion : Ambulation Ambulation/Gait Assistance: 1: +2 Total assist   See FIM for current functional status  Therapy/Group: Co-Treatment with PT in AM session and OT in PM session  Donell Tomkins S Fionnuala Hemmerich S. Junie Engram, PT, DPT 10/10/2013, 4:17 PM

## 2013-10-10 NOTE — Progress Notes (Signed)
Occupational Therapy Session Note  Patient Details  Name: Cory [name] MRN: [id number] Date of Birth: 08/22/1981  Today's Date: 10/10/2013 Time: 4098-1191 Time Calculation (min): 45 min  Short Term Goals: Week 4:  OT Short Term Goal 1 (Week 4): Pt will  demosntrate sustation attention to self feeding task for 3 min with mod A  OT Short Term Goal 2 (Week 4): Pt will follow one step simple self care task with mod A  OT Short Term Goal 3 (Week 4): Pt will perform 1/2 grooming tasks with mod  A for initiation  OT Short Term Goal 4 (Week 4): Pt will assist with transfer 20% of the time.  Skilled Therapeutic Interventions/Progress Updates:    1:1 Pt in bed but easily awaken. Pt wrapped in a blanket. When offered a shirt pt donned with min A. Pt followed simple command to come to EOB and transfer into regular w/c with mod A. Pt had been incontinent of urine in brief- doffed brief.  Pt taken into the bathroom to start introducing toilet in prep for timed toileting. Pt only sat on it for 10-15 seconds before wanting to transfer back into w/c. Transitioned to the sink- Pt with HOH A turned on the water and put both hands in the water- at first had a startle reflex but then took the washcloth off the counter, got it wet and then brought it to his face to wash. Pt also assisted with washing periarea. LB dressing with error of donning pants on arms requiring redirection. Pt with automatic behaviors to thread and then stand to pull up (maintaining WB status). Transitioned into the dayroom to eat breakfast. Pt with more attempts to mouth words but easily frustrated with no output. Pt participated in self feeding with A for oral hygiene.  Therapy Documentation Precautions:  Precautions Precautions: Fall Precaution Comments: peg with abdominal binder, LUE splint, and L LE KI.   Required Braces or Orthoses: Knee Immobilizer - Left Knee Immobilizer - Left: On at all times Other Brace/Splint: Bil PRAFOs and  L UE and LE splint can be removed for ROM with Ot/PT only.   Restrictions Weight Bearing Restrictions: Yes LUE Weight Bearing: Non weight bearing LLE Weight Bearing: Touchdown weight bearing LLE Partial Weight Bearing Percentage or Pounds: 50% Pain:  no indications of pain   See FIM for current functional status  Therapy/Group: Individual Therapy  [name] [facility] 10/10/2013, 11:05 AM

## 2013-10-10 NOTE — Progress Notes (Signed)
Physical Therapy Session Note  Patient Details  Name: Cory [name] MRN: [id number] Date of Birth: 03-17-1981  Today's Date: 10/10/2013 Time: 1030-1100 Time Calculation (min): 30 min  Skilled Therapeutic Interventions/Progress Updates:  Co-tx this session w/ another physical therapist. Pt received sitting in w/c w/ physical therapist. Focus this session on following 1 step commands during functional mobility, tolerance to sitting in regular w/c, automatic response to stimuli. See tx note by pt's primary physicla therapist Perlie Mayo, PT for details.    Therapy Documentation Precautions:  Precautions Precautions: Fall Precaution Comments:  L LE KI on during movement, ok to be off in bed and sitting in w/c Required Braces or Orthoses: Knee Immobilizer - Left Knee Immobilizer - Left: On when out of bed or walking Other Brace/Splint: Bil PRAFOs and L UE and LE splint can be removed for ROM with Ot/PT only.   Restrictions Weight Bearing Restrictions: Yes LUE Weight Bearing: Non weight bearing LLE Weight Bearing: Partial weight bearing LLE Partial Weight Bearing Percentage or Pounds: 50%  See FIM for current functional status  Therapy/Group: Co-Treatment  Zerita Boers S 10/10/2013, 3:21 PM

## 2013-10-10 NOTE — Progress Notes (Signed)
Speech Language Pathology Daily Session Note  Patient Details  Name: Cory [name] MRN: [id number] Date of Birth: Jan 11, 1981  Today's Date: 10/10/2013 Time: 1130-1210 Time Calculation (min): 40 min  Short Term Goals: Week 4: SLP Short Term Goal 1 (Week 4): Pt will respond to yes/no questions in regards to biographical information with gestures/verbalizations with 50% of opportunities SLP Short Term Goal 2 (Week 4): Pt will follow 1 step commands with 75% of opportunities with Mod A multimodal cueing  SLP Short Term Goal 3 (Week 4): Pt will demonstrate sustained attention during a functioanl task for 30 minutes with Mod A verbal cues for redirection  SLP Short Term Goal 4 (Week 4): pt will initiate functional tasks with 50% of opportunities with Mod A multimodal cueing  SLP Short Term Goal 5 (Week 4): Pt will consume Dys. 1 textures with honey-thick liquids with minimal overt s/s of aspiration with Max A verbal cues for utilization of small bites/sips.   Skilled Therapeutic Interventions: Treatment focus on cognitive and dysphagia goals.  SLP facilitated session by providing Max-Total A multimodal cueing for sustained attention to self-feeding of lunch meal. Pt appeared restless and was constantly propelling his wheelchair throughout the meal. Pt self-feed ~75% of meal but this clinician self-fed remainder of meal to increase overall PO intake.  Pt continues to demonstrate intermittent throat clearing throughout the meal and Max A to self-monitor and correct anterior spillage. Continue plan of care.    FIM:  Comprehension Comprehension Mode: Auditory Comprehension: 1-Understands basic less than 25% of the time/requires cueing 75% of the time Expression Expression Mode: Nonverbal Expression: 1-Expresses basis less than 25% of the time/requires cueing greater than 75% of the time. Social Interaction Social Interaction: 1-Interacts appropriately less than 25% of the time. May be withdrawn  or combative. Problem Solving Problem Solving: 1-Solves basic less than 25% of the time - needs direction nearly all the time or does not effectively solve problems and may need a restraint for safety Memory Memory: 1-Recognizes or recalls less than 25% of the time/requires cueing greater than 75% of the time FIM - Eating Eating Activity: 4: Help with managing cup/glass;4: Helper occasionally scoops food on utensil  Pain No s/s of pain   Therapy/Group: Individual Therapy  Brain Honeycutt 10/10/2013, 2:49 PM

## 2013-10-10 NOTE — Progress Notes (Signed)
Subjective/Complaints:   no new issues. Eating well.   A  review of systems has been performed and if not noted above is otherwise negative. .  Objective: Vital Signs: Blood pressure 110/69, pulse 99, temperature 98.3 F (36.8 C), temperature source Oral, resp. rate 19, height 5' 2.99" (1.6 m), weight 47.7 kg (105 lb 2.6 oz), SpO2 96.00%. No results found. No results found for this basename: WBC, HGB, HCT, PLT,  in the last 72 hours No results found for this basename: NA, K, CL, CO, GLUCOSE, BUN, CREATININE, CALCIUM,  in the last 72 hours CBG (last 3)  No results found for this basename: GLUCAP,  in the last 72 hours  Wt Readings from Last 3 Encounters:  10/10/13 47.7 kg (105 lb 2.6 oz)  09/13/13 46.811 kg (103 lb 3.2 oz)  09/13/13 46.811 kg (103 lb 3.2 oz)    Physical Exam:  Constitutional:      Lethargic to restless, alternating, supine to sit as well as dual leg lifts,  Eyes: Right eye exhibits no discharge. Left eye exhibits no discharge.  Neck:  Janina Mayo out , stoma closed Cardiovascular: Normal rate and regular rhythm.  Respiratory: Effort normal.  GI: Soft. Stoma with scab no drainage minimal tenderness He has no cervical adenopathy.  Neurological:    Awake,distracted. Not following commands. Will make brief eye contact with me but attention is only a few secs Musc:  Left arm flexes and extends no pain over healed incision  Skin: no drainage around former g tube site.   Assessment/Plan: 1. 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 Physiatrist is providing close team supervision and 24 hour management of active medical problems listed below. Physiatrist and rehab team continue to assess barriers to discharge/monitor patient progress toward functional and medical goals.       FIM: FIM - Bathing Bathing Steps Patient Completed:  (patient is scheduled  for night bathing) Bathing: 0: Activity did not occur (did not occur with therapies)  FIM - Upper Body Dressing/Undressing Upper body dressing/undressing: 0: Wears gown/pajamas-no public clothing FIM - Lower Body Dressing/Undressing Lower body dressing/undressing: 0: Wears Oceanographer  FIM - Toileting Toileting: 0: No continent bowel/bladder events this shift  FIM - Archivist Transfers: 0-Activity did not occur  FIM - Games developer Transfer: 1: Two helpers  FIM - Locomotion: Wheelchair Locomotion: Wheelchair: 1: Total Assistance/staff pushes wheelchair (Pt<25%) FIM - Locomotion: Ambulation Ambulation/Gait Assistance: 1: +2 Total assist Locomotion: Ambulation: 1: Two helpers  Comprehension Comprehension Mode: Auditory Comprehension: 1-Understands basic less than 25% of the time/requires cueing 75% of the time  Expression Expression Mode: Verbal Expression: 1-Expresses basis less than 25% of the time/requires cueing greater than 75% of the time.  Social Interaction Social Interaction: 1-Interacts appropriately less than 25% of the time. May be withdrawn or combative.  Problem Solving Problem Solving: 1-Solves basic less than 25% of the time - needs direction nearly all the time or does not effectively solve problems and may need a restraint for safety  Memory Memory: 1-Recognizes or recalls less than 25% of the time/requires cueing greater than 75% of the time  Medical Problem List and Plan:  1. DVT Prophylaxis/Anticoagulation: Pharmaceutical: Lovenox  2. Pain Management: Prn oxycodone for outward signs of pain.  3. Mood: Currently too low level to evaluate.  4. Neuropsych: This patient is not capable of making decisions on his own behalf.   -  reduce amantadine  -more direct, but limited, interaction with staff on very basic level 5. Recurrent Fever: afebrile  white count declining, recheck   -wound culture with  MRSA--  -PEG site cultured negative  -left leg recultured--results pending. --resumed bactrim--continue for now  -local wound care. Left shin looks fine 6. Left tibial plateau Fx--s/p ORIF: PWB in KI (not really practical). ---essentially will be non-weight bearing 7. Left humerus Fx--s/p ORIF: NWB- may be out of splint while in bed  -HO and MO surrounding injury site 8. Leucocytosis: improving 9. Left shin and heel wound: Continue bilateral PRAFO. Pressure relief measures-- WOC assistance appreciated 10. ABLA: hgb rechecks 11. Dysphagia: Continue TF for supplement. Continue D1, honey liquids with assistance.  12. Agitation: seroquel- decreased to hs only, will increase inderal.  Stop trazodone and start zoloft, reduce symmetrel     ativan prn. [place] bed for safety--still required  LOS (Days) 27 A FACE TO FACE EVALUATION WAS PERFORMED  SWARTZ,Cory [name] 10/10/2013 8:24 AM

## 2013-10-11 ENCOUNTER — Inpatient Hospital Stay (HOSPITAL_COMMUNITY): Payer: Medicaid Other | Admitting: Speech Pathology

## 2013-10-11 ENCOUNTER — Inpatient Hospital Stay (HOSPITAL_COMMUNITY): Payer: Medicaid Other | Admitting: *Deleted

## 2013-10-11 ENCOUNTER — Encounter (HOSPITAL_COMMUNITY): Payer: Medicaid - Out of State | Admitting: Occupational Therapy

## 2013-10-11 DIAGNOSIS — S069XAA Unspecified intracranial injury with loss of consciousness status unknown, initial encounter: Secondary | ICD-10-CM

## 2013-10-11 DIAGNOSIS — I69991 Dysphagia following unspecified cerebrovascular disease: Secondary | ICD-10-CM

## 2013-10-11 DIAGNOSIS — S069X9A Unspecified intracranial injury with loss of consciousness of unspecified duration, initial encounter: Secondary | ICD-10-CM

## 2013-10-11 DIAGNOSIS — IMO0002 Reserved for concepts with insufficient information to code with codable children: Secondary | ICD-10-CM

## 2013-10-11 LAB — CREATININE, SERUM: GFR calc non Af Amer: >90 mL/min (ref >90)

## 2013-10-11 MED ORDER — COLLAGENASE 250 UNIT/GM EX OINT
TOPICAL_OINTMENT | Freq: Every day | CUTANEOUS | Status: DC
  Filled 2013-10-11: qty 30

## 2013-10-11 MED ORDER — COLLAGENASE 250 UNIT/GM EX OINT
TOPICAL_OINTMENT | Freq: Every day | CUTANEOUS | Status: AC
  Administered 2013-10-12 – 2013-10-14 (×3): via TOPICAL
  Filled 2013-10-11: qty 30
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
## 2013-10-11 NOTE — Plan of Care (Signed)
Problem: RH BLADDER ELIMINATION Goal: RH STG MANAGE BLADDER WITH ASSISTANCE STG Manage Bladder With total Assistance  Outcome: Not Progressing Incontinent of bowel   Problem: RH SAFETY Goal: RH STG ADHERE TO SAFETY PRECAUTIONS W/ASSISTANCE/DEVICE STG Adhere to Safety Precautions With max Assistance/Device.  Outcome: Not Progressing Patient is on enclosure bed. TBI

## 2013-10-11 NOTE — Consult Note (Signed)
WOC wound follow up Wound type:Refer to previous progress notes for wound assessment.  All areas improved since previous assessment. Left outer knee with full thickness wound decreased in size, depth, and drainage.  .2X.2X.2cm; 100% red and moist, no odor, mod amt red drainage.  No further fluctuance. Left anterior foot with full thickness wound decreased in nonviable tissue; 3X2cm, 90% red and moist, 10% eschar.  No odor, small amt yellow drainage. Left heel with full thickness wound decreasing in size. 2X6cm 100% pink and dry.  No odor or drainage.  Dry peeling skin at wound edges. Dressing procedure/placement/frequency: Continue present plan of care with foam dressing to outer knee, Santyl for chemical debridement to anterior foot, and gauze padding to left heel.  Discontinue Santyl after 12/20. Please re-consult if further assistance is needed.  Thank-you,  Cammie Mcgee MSN, RN, CWOCN, [place], CNS [phone]

## 2013-10-11 NOTE — Progress Notes (Signed)
Speech Language Pathology Daily Session Note  Patient Details  Name: Cory [name] MRN: [id number] Date of Birth: [birth year]  Today's Date: 10/11/2013 Time: 1130-1215 Time Calculation (min): 45 min  Short Term Goals: Week 4: SLP Short Term Goal 1 (Week 4): Pt will respond to yes/no questions in regards to biographical information with gestures/verbalizations with 50% of opportunities SLP Short Term Goal 2 (Week 4): Pt will follow 1 step commands with 75% of opportunities with Mod A multimodal cueing  SLP Short Term Goal 3 (Week 4): Pt will demonstrate sustained attention during a functioanl task for 30 minutes with Mod A verbal cues for redirection  SLP Short Term Goal 4 (Week 4): pt will initiate functional tasks with 50% of opportunities with Mod A multimodal cueing  SLP Short Term Goal 5 (Week 4): Pt will consume Dys. 1 textures with honey-thick liquids with minimal overt s/s of aspiration with Max A verbal cues for utilization of small bites/sips.   Skilled Therapeutic Interventions: Treatment focus on cognitive and dysphagia goals. Upon arrival, pt asleep in bed. Pt was easily aroused and required hygiene due to bladder incontinence.  SLP facilitated session by providing trials of both Dys. 2 and 3 textures. Pt demonstrated efficient mastication of all textures but requires Mod-Max intermittent hand over hand assist for a slow pace and small bites. Pt also consumed nectar-thick liquids without overt s/s of aspiration but required max multimodal cueing for small sips via cup. Thin liquids via cup resulted in consistent throat clearing indicative of suspected penetration.  Pt answered questions with head nods in less than 25% of opportunities but answers were not reliable.  Pt also required Max-total A for following 1 step commands due to restlessness and decreased sustained attention.  Pt continues to demonstrate appropriate intake of liquids but requires encouragement to consume Dys. 1  textures, therefore, pt's diet will be upgraded to Dys. 2 textures to increase PO intake. Continue plan of care.    FIM:  Comprehension Comprehension Mode: Auditory Comprehension: 1-Understands basic less than 25% of the time/requires cueing 75% of the time Expression Expression Mode: Nonverbal Expression: 1-Expresses basis less than 25% of the time/requires cueing greater than 75% of the time. Social Interaction Social Interaction: 1-Interacts appropriately less than 25% of the time. May be withdrawn or combative. Problem Solving Problem Solving: 1-Solves basic less than 25% of the time - needs direction nearly all the time or does not effectively solve problems and may need a restraint for safety Memory Memory: 1-Recognizes or recalls less than 25% of the time/requires cueing greater than 75% of the time FIM - Eating Eating Activity: 4: Help with managing cup/glass  Pain No s/s of pain  Therapy/Group: Individual Therapy  Jathen Sudano 10/11/2013, 3:42 PM

## 2013-10-11 NOTE — Progress Notes (Signed)
Occupational Therapy Session Note  Patient Details  Name: Cory [name] MRN: [id number] Date of Birth: 03/12/1981  Today's Date: 10/11/2013 Time: 1330-1400 Time Calculation (min): 30 min  Short Term Goals: Week 4:  OT Short Term Goal 1 (Week 4): Pt will  demosntrate sustation attention to self feeding task for 3 min with mod A  OT Short Term Goal 2 (Week 4): Pt will follow one step simple self care task with mod A  OT Short Term Goal 3 (Week 4): Pt will perform 1/2 grooming tasks with mod  A for initiation  OT Short Term Goal 4 (Week 4): Pt will assist with transfer 20% of the time.  Skilled Therapeutic Interventions/Progress Updates:    1:1 cotreat with physical therapy. Pt found in vail bed and had been incontinent of bowel. Pt covered head to toe in BM with decr awareness of BM on hands and then on face. Pt transferred into roll in shower chair with mod A. Focus on sustaining a seated position on shower chair chair, introduction and tolerance of warm shower with water on him beginning with introducing stimulus on bilateral LEs. Pt tolerated water and being washed without resistance or biting. Pt even took hand held shower head to get himself wet and keep warm. Pt with difficulty maintaining warm body temperature in warm shower. Pt assisted with donning t-shirt and sweatshirt after shower. Pt performed sit to stand with simple command for LB clothing management with min A with more than reasonable amt of time. Pt left with nursing for bandage changes. Bed to be changed out.   Therapy Documentation Precautions:  Precautions Precautions: Fall Precaution Comments:  L LE KI on during movement, ok to be off in bed and sitting in w/c Required Braces or Orthoses: Knee Immobilizer - Left Knee Immobilizer - Left: On when out of bed or walking Other Brace/Splint: Bil PRAFOs and L UE and LE splint can be removed for ROM with Ot/PT only.   Restrictions Weight Bearing Restrictions: Yes LUE Weight  Bearing: Non weight bearing LLE Weight Bearing: Non weight bearing LLE Partial Weight Bearing Percentage or Pounds: 50% Pain: No c/o pain  See FIM for current functional status  Therapy/Group: Individual Therapy/ co treat   [name] [facility] 10/11/2013, 3:38 PM

## 2013-10-11 NOTE — Progress Notes (Signed)
Subjective/Complaints:  No new problems reported. In bed this am and appears comfortable   A  review of systems has been performed and if not noted above is otherwise negative. .  Objective: Vital Signs: Blood pressure 109/74, pulse 89, temperature 97.4 F (36.3 C), temperature source Axillary, resp. rate 17, height 5' 2.99" (1.6 m), weight 43.5 kg (95 lb 14.4 oz), SpO2 95.00%. No results found. No results found for this basename: WBC, HGB, HCT, PLT,  in the last 72 hours  Recent Labs  10/11/13 0615  CREATININE 0.64   CBG (last 3)  No results found for this basename: GLUCAP,  in the last 72 hours  Wt Readings from Last 3 Encounters:  10/11/13 43.5 kg (95 lb 14.4 oz)  09/13/13 46.811 kg (103 lb 3.2 oz)  09/13/13 46.811 kg (103 lb 3.2 oz)    Physical Exam:  Constitutional:      Lethargic to restless, alternating, supine to sit as well as dual leg lifts,  Eyes: Right eye exhibits no discharge. Left eye exhibits no discharge.  Neck:  Janina Mayo out , stoma closed Cardiovascular: Normal rate and regular rhythm.  Respiratory: Effort normal.  GI: Soft. Stoma with scab no drainage minimal tenderness He has no cervical adenopathy.  Neurological:    Awake,distracted. Not following commands. Will make brief eye contact with me but attention is still limited. Musc:  Left arm flexes and extends no pain over healed incision  Skin: wounds are healing.   Assessment/Plan: 1. 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 Physiatrist is providing close team supervision and 24 hour management of active medical problems listed below. Physiatrist and rehab team continue to assess barriers to discharge/monitor patient progress toward functional and medical goals.       FIM: FIM - Bathing Bathing Steps Patient Completed:  (patient is scheduled for night bathing) Bathing: 0:  Activity did not occur (did not occur with therapies)  FIM - Upper Body Dressing/Undressing Upper body dressing/undressing steps patient completed: Thread/unthread right sleeve of pullover shirt/dresss;Thread/unthread left sleeve of pullover shirt/dress;Put head through opening of pull over shirt/dress Upper body dressing/undressing: 4: Min-Patient completed 75 plus % of tasks FIM - Lower Body Dressing/Undressing Lower body dressing/undressing: 1: Total-Patient completed less than 25% of tasks  FIM - Toileting Toileting: 0: No continent bowel/bladder events this shift  FIM - Archivist Transfers: 3-From toilet/BSC: Mod A (lift or lower assist);3-To toilet/BSC: Mod A (lift or lower assist)  FIM - Press photographer Assistive Devices: Arm rests Bed/Chair Transfer: 1: Two helpers  FIM - Locomotion: Wheelchair Locomotion: Wheelchair: 1: Total Assistance/staff pushes wheelchair (Pt<25%) FIM - Locomotion: Ambulation Ambulation/Gait Assistance: 1: +2 Total assist Locomotion: Ambulation: 1: Two helpers  Comprehension Comprehension Mode: Auditory Comprehension: 1-Understands basic less than 25% of the time/requires cueing 75% of the time  Expression Expression Mode: Nonverbal Expression: 1-Expresses basis less than 25% of the time/requires cueing greater than 75% of the time.  Social Interaction Social Interaction: 1-Interacts appropriately less than 25% of the time. May be withdrawn or combative.  Problem Solving Problem Solving: 1-Solves basic less than 25% of the time - needs direction nearly all the time or does not effectively solve problems and may need a restraint for safety  Memory Memory: 1-Recognizes or recalls less than 25% of the time/requires cueing greater than 75% of the time  Medical Problem List and Plan:  1. DVT Prophylaxis/Anticoagulation: Pharmaceutical: Lovenox  2. Pain Management: Prn oxycodone for outward signs of pain.  3.  Mood: Currently too low level to evaluate.  4. Neuropsych: This patient is not capable of making decisions on his own behalf.   -reduced amantadine  -more direct, but limited, interaction with staff on very basic level 5. Recurrent Fever: afebrile  white count declining, recheck   -wound culture with MRSA  -PEG site cultured negative  -left leg recultured--results pending. --resumed bactrim--continue for now  -local wound care. Left shin looks fine 6. Left tibial plateau Fx--s/p ORIF: PWB in KI (not really practical). ---essentially will be non-weight bearing 7. Left humerus Fx--s/p ORIF: NWB- may be out of splint while in bed  -HO and MO surrounding injury site 8. Leucocytosis: improving 9. Left shin and heel wound: Continue bilateral PRAFO. Pressure relief measures-- WOC assistance appreciated 10. ABLA: hgb rechecks 11. Dysphagia: Continue TF for supplement. Continue D1, honey liquids with assistance.  12. Agitation: seroquel- decreased to hs only, continue inderal and zoloft,       ativan prn. [place] bed for safety remains indicated  LOS (Days) 28 A FACE TO FACE EVALUATION WAS PERFORMED  Hiba Garry T 10/11/2013 8:26 AM

## 2013-10-11 NOTE — Progress Notes (Signed)
Occupational Therapy Session Note  Patient Details  Name: Cory [name] MRN: [id number] Date of Birth: [birth year]  Today's Date: 10/11/2013 Time: 6578-4696 Time Calculation (min): 45 min  Short Term Goals: Week 4:  OT Short Term Goal 1 (Week 4): Pt will  demosntrate sustation attention to self feeding task for 3 min with mod A  OT Short Term Goal 2 (Week 4): Pt will follow one step simple self care task with mod A  OT Short Term Goal 3 (Week 4): Pt will perform 1/2 grooming tasks with mod  A for initiation  OT Short Term Goal 4 (Week 4): Pt will assist with transfer 20% of the time.  Skilled Therapeutic Interventions/Progress Updates:    1:1 pt in bed when arrived and extremely restless. Pt with decr eye contact today or following basic commands. Pt had been incontinent of urine in brie. Mod A transfer bed to w/c. Once at sink in the w/c presented pt with washcloth to wash face- but would not engaged; attempted to have him wash UB- pt again not engaged but would allow this to be done to him. Pt extremely restless sitting at sink pushing himself away and pulling himself towards the sink with no purpose. Performed donning a shirt and pants but required total A with dec initiation of automatic behaviors due to restlessness. Transitioned to dayroom to engage in breakfast. Pt consumed 100% of meal but required total A for oral hygiene and tactile and verbal cues to use utensil to scoop food to bring to mouth. Pt continued to push table away and then pull himself closer to table without purpose.   Therapy Documentation Precautions:  Precautions Precautions: Fall Precaution Comments:  L LE KI on during movement, ok to be off in bed and sitting in w/c Required Braces or Orthoses: Knee Immobilizer - Left Knee Immobilizer - Left: On when out of bed or walking Other Brace/Splint: Bil PRAFOs and L UE and LE splint can be removed for ROM with Ot/PT only.   Restrictions Weight Bearing Restrictions:  Yes LUE Weight Bearing: Non weight bearing LLE Weight Bearing: Non weight bearing LLE Partial Weight Bearing Percentage or Pounds: 50% :   Pain:  no indications of pain but extremely restless today and unsure if pt is experiencing pain   See FIM for current functional status  Therapy/Group: Individual Therapy  [name] [facility] 10/11/2013, 10:56 AM

## 2013-10-11 NOTE — Progress Notes (Signed)
Physical Therapy Session Note  Patient Details  Name: Cory [name] MRN: [id number] Date of Birth: June 01, 1981  Today's Date: 10/11/2013 Time: [phone] and 1400-1415 (co treat with OT 1330-1415) Time Calculation (min): 41 min and 15 min  Short Term Goals: Week 3:  PT Short Term Goal 1 (Week 3): Patient will demonstrate sustained attention x 20" in a minimally distractive environment. PT Short Term Goal 2 (Week 3): Patient will demonstrate functional object use while sitting edge of bed on 2 of 3 occasions. PT Short Term Goal 3 (Week 3): Patient will follow one step commands 3/3 trials while sitting edge of bed.  Skilled Therapeutic Interventions/Progress Updates:    AM Session: Patient received sitting in wheelchair. Session focused on increasing tolerance to sitting in wheelchair, following one-step commands with functional mobility, and automatic response to stimuli. With R shoe donned, patient performed wheelchair mobility (~40') around dayroom with maxA for safety and negotiation of obstacles and people, however, patient able to perform ~75% of propulsion using R LE only. Patient utilizes B UEs to pull on handrails/doorway frames/tables to assist with propulsion. Patient repeatedly stopping activity and closing eyes, requiring max verbal and tactile cues to continue activity. In therapy gym, assessed patient's automatic response to stimuli: throwing ball at patient, rolling ball to patient. Patient maintains eyes closed and makes no attempt to catch ball or even open eyes to see ball coming towards him. With verbal and tactile cues, patient able to open eyes for approx 5-10" at a time before closing eyes again. Attempted to have patient take certain objects from therapist. Patient only able to perform activity x3 with max verbal and tactile cues to open eyes and attend to object. Each time, patient brings objects to mouth. Patient continues to maintain eyes closed and does not open, even with  max cues. Patient pushes therapist's hands away. Patient returned to room and attempted to have patient engage in self-care tasks of washing hands and face. Patient requires totalA/HOH assist to wash hands, maintains eyes closed and head down resting on sink. Patient does not actively participate in drying hands with paper towel or washing face, maintaining eyes closed and head down. Patient returned to bed via stand pivot with maxA, minA for sit>supine. Patient left supine in enclosure bed, RN aware of patient status.  PM Session: Patient received supine in bed, incontinent of bowel. Session focused on introduction of new stimuli and self-care with showering. Patient covered  in BM with decreased awareness of BM on hands and then on face. Patient transferred into roll in shower chair with mod A. Focus on sustaining a seated position on shower chair, introduction and tolerance of warm shower with water on him beginning with introducing stimulus on bilateral LEs. Patient tolerated water and being washed without resistance or biting. Patient able to grab hand held shower head to get himself wet and keep warm. Patient with difficulty maintaining warm body temperature in warm shower. Patient assisted with donning t-shirt and sweatshirt after shower. Patient performed sit to stand with simple command for LB clothing management with min A with more than reasonable amt of time. Patient left with nursing for bandage changes. Bed to be changed out.    Therapy Documentation Precautions:  Precautions Precautions: Fall Precaution Comments:  L LE KI on during movement, ok to be off in bed and sitting in w/c Required Braces or Orthoses: Knee Immobilizer - Left Knee Immobilizer - Left: On when out of bed or walking Restrictions Weight Bearing Restrictions: Yes  LUE Weight Bearing: Non weight bearing LLE Weight Bearing: Non weight bearing LLE Partial Weight Bearing Percentage or Pounds: 50% General: Amount of  Missed PT Time (min): 19 Minutes Missed Time Reason: Patient fatigue Locomotion : Ambulation Ambulation/Gait Assistance: Not tested (comment) Wheelchair Mobility Distance: 30   See FIM for current functional status  Therapy/Group: Individual Therapy in AM Session and Co-Treatment with OT in PM Session  Chipper Herb. Cory [name], PT, DPT  10/11/2013, 9:14 AM

## 2013-10-12 ENCOUNTER — Inpatient Hospital Stay (HOSPITAL_COMMUNITY): Payer: Medicaid Other | Admitting: *Deleted

## 2013-10-12 ENCOUNTER — Encounter (HOSPITAL_COMMUNITY): Payer: Medicaid - Out of State | Admitting: Occupational Therapy

## 2013-10-12 ENCOUNTER — Ambulatory Visit (HOSPITAL_COMMUNITY): Payer: Medicaid - Out of State

## 2013-10-12 MED ORDER — PROPRANOLOL HCL 20 MG PO TABS
30.0000 mg | ORAL_TABLET | Freq: Three times a day (TID) | ORAL | Status: DC
  Administered 2013-10-12 – 2013-11-07 (×75): 30 mg via ORAL
  Filled 2013-10-12 (×85): qty 1

## 2013-10-12 NOTE — Progress Notes (Signed)
Speech Language Pathology Daily Session Note  Patient Details  Name: Cory [name] MRN: [id number] Date of Birth: July 29, 1981  Today's Date: 10/12/2013 Time: 1130-1215 Time Calculation (min): 45 min  Short Term Goals: Week 4: SLP Short Term Goal 1 (Week 4): Pt will respond to yes/no questions in regards to biographical information with gestures/verbalizations with 50% of opportunities SLP Short Term Goal 2 (Week 4): Pt will follow 1 step commands with 75% of opportunities with Mod A multimodal cueing  SLP Short Term Goal 3 (Week 4): Pt will demonstrate sustained attention during a functioanl task for 30 minutes with Mod A verbal cues for redirection  SLP Short Term Goal 4 (Week 4): pt will initiate functional tasks with 50% of opportunities with Mod A multimodal cueing  SLP Short Term Goal 5 (Week 4): Pt will consume Dys. 1 textures with honey-thick liquids with minimal overt s/s of aspiration with Max A verbal cues for utilization of small bites/sips.   Skilled Therapeutic Interventions: Max cueing provided for sustained attention to functional and familiar ADL (self feeding). Patient initially requiring max cueing for decreased bite/sip size due to impulsivity with feeding, decreased to min-mod cues towards end of session with patient pushing food away in refusal. Patient non-verbal despite max cues for verbal interaction. Dysphagia 2 solids masticated well without overt s/s of aspiration. Increased throat clearing with thin vs nectar thick liquids clinician provided po trials noted suggestive of decreased airway protection although diagnostic treatment at bedside difficult due to cognitive status, non-verbal nature  prohibiting SLP from assessing vocal quality. Will continue current diet.    FIM:  Comprehension Comprehension Mode: Auditory Comprehension: 1-Understands basic less than 25% of the time/requires cueing 75% of the time Expression Expression Mode: Nonverbal Expression:  1-Expresses basis less than 25% of the time/requires cueing greater than 75% of the time. Social Interaction Social Interaction: 1-Interacts appropriately less than 25% of the time. May be withdrawn or combative. Problem Solving Problem Solving: 1-Solves basic less than 25% of the time - needs direction nearly all the time or does not effectively solve problems and may need a restraint for safety Memory Memory: 1-Recognizes or recalls less than 25% of the time/requires cueing greater than 75% of the time  Pain Pain Assessment Pain Assessment: No/denies pain  Therapy/Group: Individual Therapy  Izyk Marty Meryl 10/12/2013, 1:32 PM

## 2013-10-12 NOTE — Progress Notes (Signed)
Occupational Therapy Session Note  Patient Details  Name: Cory [name] MRN: [id number] Date of Birth: [birth year]  Today's Date: 10/12/2013 Time: 4098-1191 Time Calculation (min): 45 min  Short Term Goals: Week 4:  OT Short Term Goal 1 (Week 4): Pt will  demosntrate sustation attention to self feeding task for 3 min with mod A  OT Short Term Goal 2 (Week 4): Pt will follow one step simple self care task with mod A  OT Short Term Goal 3 (Week 4): Pt will perform 1/2 grooming tasks with mod  A for initiation  OT Short Term Goal 4 (Week 4): Pt will assist with transfer 20% of the time.  Skilled Therapeutic Interventions/Progress Updates:    1:1 Pt in bed and had been incontinent of bowel and bladder and had BM on blankets and hands. Focus on pt's tolerance of washing hands, performing pericare and changing brief. Pt performed transfer into w/c with mod A. Engaged in dressing with max tactile and environmental cues to initiate  tasks. Once shirt was on UEs pt automatically took over task. Pt perform sit to stand with min A for pulling up pants once therapist assisted with threading. Pt engaged in eating breakfast with focus on sustained attention, utilizing a utensil to eat with instead of "drinking" everything. Pt required max cuing to attend to task to complete self feeding.    Therapy Documentation Precautions:  Precautions Precautions: Fall Precaution Comments:  L LE KI on during movement, ok to be off in bed and sitting in w/c Required Braces or Orthoses: Knee Immobilizer - Left Knee Immobilizer - Left: On when out of bed or walking Other Brace/Splint: Bil PRAFOs and L UE and LE splint can be removed for ROM with Ot/PT only.   Restrictions Weight Bearing Restrictions: Yes LUE Weight Bearing: Non weight bearing LLE Weight Bearing: Touchdown weight bearing LLE Partial Weight Bearing Percentage or Pounds: 50% Pain: Pain Assessment Pain Assessment: Faces Faces Pain Scale: Hurts  whole lot Pain Type: Acute pain Pain Location: Leg Pain Orientation: Left Pain Descriptors / Indicators:  (Extreme restlessness, mild agitation) Pain Onset: Gradual RN had just given pt meds for pain prior to session   See FIM for current functional status  Therapy/Group: Individual Therapy  [name] [facility] 10/12/2013, 10:14 AM
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
## 2013-10-12 NOTE — Progress Notes (Signed)
Increased restlessness; unable to follow simple commands,  Oxy IR given, ineffective; Ativan given po, drowsy with next session.  Incontinent urine x3; incontinent large BM, requiring shower and new enclosure bed. ( see OT note.)  Wound care x2; dressings come off d/t restlessness, movements.

## 2013-10-12 NOTE — Progress Notes (Signed)
Physical Therapy Session Note  Patient Details  Name: Cory [name] MRN: [id number] Date of Birth: [birth year]  Today's Date: 10/12/2013 Time: 1330-1345 and 1505-1600 Time Calculation (min): 15 min and 55 min  Short Term Goals: Week 3:  PT Short Term Goal 1 (Week 3): Patient will demonstrate sustained attention x 20" in a minimally distractive environment. PT Short Term Goal 2 (Week 3): Patient will demonstrate functional object use while sitting edge of bed on 2 of 3 occasions. PT Short Term Goal 3 (Week 3): Patient will follow one step commands 3/3 trials while sitting edge of bed.  Skilled Therapeutic Interventions/Progress Updates:    First session: cotreat with occupational therapy. Patient received supine in bed.Transferred into w/c with mod A +1. Transitioned in gym and set in front standing frame. Focus on attempts for standing tolerance and upright posture with decreased restlessness, maintaining 50% weight bearing on L LE. First trial patient stood for with max cuing for maintaining standing. Patient engaged in eating a magic cup in standing - successful task to distract from pulling at frame's straps. 2nd standing attempt only for 5-7 min. BP taken but unsure if it was accurate secondary to R UE and trunk movements. Patient assisted back to bed at end of session with modA.  Second session: Patient received supine in bed.Transferred into w/c with mod A +1. In therapy gym, assessed patient's automatic response to stimuli, throwing ball at patient. Patient maintains eyes closed and makes no attempt to catch ball or even open eyes to see ball coming towards him. With verbal and tactile cues, patient able to open eyes for approx 5-10" at a time before closing eyes again. Patient not engaging during session. Patient returned to RN station seated in wheelchair with posey belt, RN aware.  Therapy Documentation Precautions:  Precautions Precautions: Fall Precaution Comments:  L LE KI  on during movement, ok to be off in bed and sitting in w/c Required Braces or Orthoses: Knee Immobilizer - Left Knee Immobilizer - Left: On when out of bed or walking Other Brace/Splint: Bil PRAFOs and L UE and LE splint can be removed for ROM with Ot/PT only.   Restrictions Weight Bearing Restrictions: Yes LUE Weight Bearing: Non weight bearing LLE Weight Bearing: Partial weight bearing LLE Partial Weight Bearing Percentage or Pounds: 50% Pain: Pain Assessment Pain Assessment: No/denies pain Pain Score: 0-No pain Locomotion : Ambulation Ambulation/Gait Assistance: Not tested (comment)   See FIM for current functional status  Therapy/Group: Individual Therapy during second session and Co-Treatment during first session  Bernice Mcauliffe S Jackey Loge. Mishka Stegemann, PT, DPT  10/12/2013, 4:16 PM

## 2013-10-12 NOTE — Progress Notes (Signed)
Occupational Therapy Session Note  Patient Details  Name: Cory [name] MRN: [id number] Date of Birth: [birth year]  Today's Date: 10/12/2013 Time: 1300-1330 Time Calculation (min): 30 min  Short Term Goals: Week 4:  OT Short Term Goal 1 (Week 4): Pt will  demosntrate sustation attention to self feeding task for 3 min with mod A  OT Short Term Goal 2 (Week 4): Pt will follow one step simple self care task with mod A  OT Short Term Goal 3 (Week 4): Pt will perform 1/2 grooming tasks with mod  A for initiation  OT Short Term Goal 4 (Week 4): Pt will assist with transfer 20% of the time.  Skilled Therapeutic Interventions/Progress Updates:    1:1 cotreat with physical therapy. Pt in bed when arrived. Transferred into w/c with mod A +1. Transitioned in gym and set in front standing frame. Focus on attempts for standing tolerance and bilateral weightbearing. At first trial pt stood for at a time with max cuing for maintaining standing. Pt engaged in eating a magic cup in standing - successful task to distract from pulling at frame's straps. 2nd standing attempt only for 5-7 min. BP taken but unsure if it was accurate. Pt assisted back to bed at end of session.   Therapy Documentation Precautions:  Precautions Precautions: Fall Precaution Comments:  L LE KI on during movement, ok to be off in bed and sitting in w/c Required Braces or Orthoses: Knee Immobilizer - Left Knee Immobilizer - Left: On when out of bed or walking Other Brace/Splint: Bil PRAFOs and L UE and LE splint can be removed for ROM with Ot/PT only.   Restrictions Weight Bearing Restrictions: Yes LUE Weight Bearing: Non weight bearing LLE Weight Bearing: Touchdown weight bearing LLE Partial Weight Bearing Percentage or Pounds: 50% Pain: Pain Assessment Pain Assessment: No/denies pain  See FIM for current functional status  Therapy/Group: Individual Therapy and Co-Treatment  [name] [facility] 10/12/2013,  2:14 PM

## 2013-10-12 NOTE — Progress Notes (Signed)
Pt grimacing, restless; often bending over and holding stomach; Maalox prn dose given. Monitor.

## 2013-10-13 ENCOUNTER — Encounter (HOSPITAL_COMMUNITY): Payer: Medicaid - Out of State | Admitting: Rehabilitation

## 2013-10-13 ENCOUNTER — Ambulatory Visit (HOSPITAL_COMMUNITY): Payer: Medicaid - Out of State | Admitting: Speech Pathology

## 2013-10-13 ENCOUNTER — Inpatient Hospital Stay (HOSPITAL_COMMUNITY): Payer: Medicaid Other | Admitting: Speech Pathology

## 2013-10-13 ENCOUNTER — Ambulatory Visit (HOSPITAL_COMMUNITY): Payer: Medicaid - Out of State | Admitting: *Deleted

## 2013-10-13 ENCOUNTER — Inpatient Hospital Stay (HOSPITAL_COMMUNITY): Payer: Medicaid Other | Admitting: *Deleted

## 2013-10-13 MED ORDER — SACCHAROMYCES BOULARDII 250 MG PO CAPS
250.0000 mg | ORAL_CAPSULE | Freq: Two times a day (BID) | ORAL | Status: DC
Start: 2013-10-13 — End: 2013-10-17
  Administered 2013-10-13 – 2013-10-17 (×8): 250 mg via ORAL
  Filled 2013-10-13 (×10): qty 1

## 2013-10-13 MED ORDER — TRAMADOL HCL 50 MG PO TABS
50.0000 mg | ORAL_TABLET | Freq: Two times a day (BID) | ORAL | Status: DC
  Administered 2013-10-14 – 2013-11-08 (×50): 50 mg via ORAL
  Filled 2013-10-13 (×51): qty 1

## 2013-10-13 MED ORDER — DOCUSATE SODIUM 283 MG RE ENEM
1.0000 | ENEMA | Freq: Every day | RECTAL | Status: DC
  Administered 2013-10-14 – 2013-11-07 (×24): 283 mg via RECTAL
  Filled 2013-10-13 (×28): qty 1

## 2013-10-13 NOTE — Progress Notes (Addendum)
Physical Therapy Note  Patient Details  Name: Cory [name] MRN: [id number] Date of Birth: [birth year] Today's Date: 10/13/2013  Skilled co-treatment with PT during session to address following simple commands, focused and sustained attention, and response to stimuli while performing bed mobility, sit <> stand and stair negotiation.  See primary PT Clarisse Gouge Ripa's note for further details.     Vista Deck 10/13/2013, 4:04 PM

## 2013-10-13 NOTE — Progress Notes (Signed)
Physical Therapy Weekly Progress Note  Patient Details  Name: Cory [name] MRN: [id number] Date of Birth: Oct 24, 1981  Today's Date: 10/13/2013 Time: 1500 (co treat with other PT 1500-1545)-1530 Time Calculation (min): 30 min  Patient has met 3 of 3 short term goals. Patient has made small gains this reporting period due to improvements in visual tracking, focused attention, and command following. Currently, patient is demonstrating behaviors consistent of a Rancho Level IV. Patient demonstrates increased ability and consistency to track to midline, localize to noxious stimuli, localize a response to auditory stimuli in B fields of environment. Patient continues to demonstrate decreased ability to consistently answers yes/no questions with a head nod. Patient is demonstrating increased initiation with wants (propelling wheelchair, removing knee immobilizer, doffing clothes, scratching face, attempting to remove posey belt, feeding self, etc.). Additionally, patient is becoming less tolerable of hands on facilitation during mobility and self-care. Patient still requires total A +2 for all functional mobility tasks. PT is continuing to doff L knee immobilizer and range LE in all planes as well as address skin integrity.  Patient continues to demonstrate the following deficits: muscle weakness, decreased cardiorespiratoy endurance, impaired timing and sequencing, abnormal tone, unbalanced muscle activation, motor apraxia, decreased motor planning, decreased visual perceptual skills and decreased visual motor skills, decreased midline orientation, decreased attention to right, decreased initiation, decreased problem solving, decreased safety awareness, decreased memory and delayed processing and decreased sitting balance, decreased standing balance, hemiplegia, decreased balance strategies, and therefore will continue to benefit from skilled PT intervention to enhance overall performance with activity  tolerance, balance, postural control, ability to compensate for deficits, functional use of  right upper extremity, right lower extremity, left upper extremity and left lower extremity, attention, awareness, coordination and knowledge of precautions.  Patient progressing toward long term goals..  Continue plan of care.  PT Short Term Goals Week 1:  PT Short Term Goal 1 (Week 1): Pt will participate 10% with functional transfer PT Short Term Goal 1 - Progress (Week 1): Not met PT Short Term Goal 2 (Week 1): Pt will demo focused attention to functional activity  x 10 seconds with max A PT Short Term Goal 2 - Progress (Week 1): Not met PT Short Term Goal 3 (Week 1): Pt will maintain static seated balance for therapeutic activity x 30 seconds with pt assisting 25% PT Short Term Goal 3 - Progress (Week 1): Not met Week 2:  PT Short Term Goal 1 (Week 2): Patient will demonstrate a localized response in right field with audiotory stimulus 75% of the time with max cuing PT Short Term Goal 1 - Progress (Week 2): Met PT Short Term Goal 2 (Week 2): Pt will demonstrate focused attention for 3 seconds with max cuing for in preparation for bed<>wheelchair transfer. PT Short Term Goal 2 - Progress (Week 2): Met PT Short Term Goal 3 (Week 2): Pt will visually track a functional object 50% of opportunities with max cuing PT Short Term Goal 3 - Progress (Week 2): Met Week 3:  PT Short Term Goal 1 (Week 3): Patient will demonstrate sustained attention x 20" in a minimally distractive environment. PT Short Term Goal 1 - Progress (Week 3): Met PT Short Term Goal 2 (Week 3): Patient will demonstrate functional object use while sitting edge of bed on 2 of 3 occasions. PT Short Term Goal 2 - Progress (Week 3): Met PT Short Term Goal 3 (Week 3): Patient will follow one step commands 3/3 trials while sitting edge of  bed. PT Short Term Goal 3 - Progress (Week 3): Met Week 4:  PT Short Term Goal 1 (Week 4): Patient will  follow simple one step commands with functional mobility with modA. PT Short Term Goal 2 (Week 4): Patient will assist with transfers 50% of the time with modA. PT Short Term Goal 3 (Week 4): Patient will demonstrate sustained attention to gait training x1 min with modA.  Skilled Therapeutic Interventions/Progress Updates:    First session: Patient missed 45 minutes of skilled PT/OT co-treatment secondary to increased fatigue and discomfort today. RN requesting for patient to remain bed since he is currently asleep. Will follow up later this PM.  Second session: Patient received supine in bed. Session focused on following simple one step commands during functional mobility tasks. Patient performed squat pivot transfer mod A +2 bed>w/c. Transitioned to gym for attempts for functional ambulation via three musketeers style (patient's B UEs around therapists' shoulders) 15' x2. Several sit<>stands prior to gait training, patient initiating stepping. Initially, patient demonstrating improved reciprocal movement of B LEs and increased attention to task.Towards end of second trial, patient demonstrating increased trunk flexion and increased extensor tone of B LEs, not demonstrating any sort of reciprocal movement for functional gait, patient essentially sliding B LEs along floor. Between trials of gait training, patient very restless on mat rolling to B sides, sitting up, laying back down, etc. Patient negotiated 3 steps with +2 assist, 3 musketeer style. Patient removes L UE from around therapist to utilize handrail. Patient initiating stepping up onto step each time. +2 to descend stairs backwards, patient with less initiation for stepping. Patient returned to room and left supine in enclosure bed.  Therapy Documentation Precautions:  Precautions Precautions: Fall Precaution Comments:  L LE KI on during movement, ok to be off in bed and sitting in w/c Required Braces or Orthoses: Knee Immobilizer -  Left Knee Immobilizer - Left: On when out of bed or walking Other Brace/Splint: Bil PRAFOs and L UE and LE splint can be removed for ROM with Ot/PT only.   Restrictions Weight Bearing Restrictions: Yes LUE Weight Bearing: Non weight bearing LLE Weight Bearing: Partial weight bearing LLE Partial Weight Bearing Percentage or Pounds: 50% General: Amount of Missed PT Time (min): 45 Minutes Locomotion : Ambulation Ambulation/Gait Assistance: 1: +2 Total assist   See FIM for current functional status  Therapy/Group: Co-Treatment with PT   Walter Min S Brenleigh Collet S. Robbert Langlinais, PT, DPT 10/13/2013, 3:53 PM

## 2013-10-13 NOTE — Progress Notes (Signed)
Occupational Therapy Weekly Progress Note  Patient Details  Name: Cory [name] MRN: [id number] Date of Birth: 02-08-1981  Today's Date: 10/13/2013 Time: 4098-1191 Time Calculation (min): 45 min  Pt in bed without any clothes on when arrived. Focused on initiation of following one step basic commands including bed mobility and transferring  into w/c with mod A. Presented shirt with A for initiation of task with backwards chaining. Pt required more than reasonable amt of time to don shirt with mod tactile cues. Pt with no clean pants so gown don on top of clothing. Attempted washing hands and face at sink but pt with active resistance to perform requiring total A. Pt transitioned into the dayroom to eat breakfast. Pt did so a dislike of sausage. Pt able to self feeding dys 1 textures but required total A to scoop dys 2 texture on the utensil to bring to his mouth 75% of the time due to impulsivity.   2nd session: Missed co treat with PT due to fatigue and abdominal discomfort.  Weekly Note Patient has met 2 of 4 short term goals.  This week pt with more consistent Rancho Level IV behaviors of resisting touch, decr participation in functional tasks, and resisting assistance with tasks. Pt can self feed dys 1 textures but requires A for coordination for dys 2 textures due to impulsivity. Pt continues to remain non verbal and total A for basic ADL tasks, and mod A for basic transfers. Pt continues to be restless at all times when awake with non purposeful movements. Max-total A for sustained attention, safety awareness, functional problem solving, intellectual awareness and initiation.    Patient continues to demonstrate the following deficits: muscle weakness and muscle joint tightness, decreased cardiorespiratoy endurance, impaired timing and sequencing, unbalanced muscle activation, decreased coordination and decreased motor planning, decreased attention to left, decreased attention, decreased  awareness, decreased problem solving, decreased safety awareness, decreased memory and delayed processing and decreased standing balance, decreased postural control, hemiplegia, decreased balance strategies and difficulty maintaining precautions and therefore will continue to benefit from skilled OT intervention to enhance overall performance with BADL and Reduce care partner burden.  Patient progressing toward long term goals..  Continue plan of care.  OT Short Term Goals Week 4:  OT Short Term Goal 1 (Week 4): Pt will  demosntrate sustation attention to self feeding task for 3 min with mod A  OT Short Term Goal 1 - Progress (Week 4): Progressing toward goal OT Short Term Goal 2 (Week 4): Pt will follow one step simple self care task with mod A  OT Short Term Goal 2 - Progress (Week 4): Met OT Short Term Goal 3 (Week 4): Pt will perform 1/2 grooming tasks with mod  A for initiation  OT Short Term Goal 3 - Progress (Week 4): Progressing toward goal OT Short Term Goal 4 (Week 4): Pt will assist with transfer 20% of the time. OT Short Term Goal 4 - Progress (Week 4): Met Week 5:  OT Short Term Goal 1 (Week 5): Pt will don a shirt with mod A  OT Short Term Goal 2 (Week 5): Pt will descriminate between 2 grooming tools for correct purpose with mod cuing with extra time OT Short Term Goal 3 (Week 5): Pt will transfer squat pivot with min A  OT Short Term Goal 4 (Week 5): Pt will sustain attention for 2 min during functional task  Skilled Therapeutic Interventions/Progress Updates:    continue with POC  Therapy Documentation Precautions:  Precautions Precautions: Fall Precaution Comments:  L LE KI on during movement, ok to be off in bed and sitting in w/c Required Braces or Orthoses: Knee Immobilizer - Left Knee Immobilizer - Left: On when out of bed or walking Other Brace/Splint: Bil PRAFOs and L UE and LE splint can be removed for ROM with Ot/PT only.   Restrictions Weight Bearing  Restrictions: Yes LUE Weight Bearing: Non weight bearing LLE Weight Bearing: Partial weight bearing LLE Partial Weight Bearing Percentage or Pounds: 50% Pain:  appears to have abdominal discomfort; RN aware  See FIM for current functional status  Therapy/Group: Individual Therapy  [name] [facility] 10/13/2013, 11:24 AM

## 2013-10-13 NOTE — Progress Notes (Signed)
Very restless again today; constant grimace expression. 'Rocking' motion , draws legs up to stomach, holds abd. at times. Oxy IR x2 given, pt. calmer after medicated with pain med and maalox.  INcontinent mod soft stool x2.  Discussed with Marissa Nestle, PAC; orders received; scheduled Ultram; 'Mini-enema' q am;  attempting bowel program to establish routine pattern. Dressings changed x2 as pt. removes. Pts mother in to visit briefly ( ) No interaction, states "I can't take this, I have to leave." Requested clothing for pt.Cory [name]

## 2013-10-13 NOTE — Progress Notes (Signed)
Speech Language Pathology Weekly Progress & Session Notes  Patient Details  Name: Cory [name] MRN: [id number] Date of Birth: 15-Apr-1981  Today's Date: 10/13/2013  Session 1 Time: 1000-1030 Time Calculation (min): 30 min  Session 2 Time: Missed 45 minutes of skilled SLP intervention   Short Term Goals: Week 4: SLP Short Term Goal 1 (Week 4): Pt will respond to yes/no questions in regards to biographical information with gestures/verbalizations with 50% of opportunities SLP Short Term Goal 1 - Progress (Week 4): Not met SLP Short Term Goal 2 (Week 4): Pt will follow 1 step commands with 75% of opportunities with Mod A multimodal cueing  SLP Short Term Goal 2 - Progress (Week 4): Not met SLP Short Term Goal 3 (Week 4): Pt will demonstrate sustained attention during a functioanl task for 30 minutes with Mod A verbal cues for redirection  SLP Short Term Goal 3 - Progress (Week 4): Not met SLP Short Term Goal 4 (Week 4): pt will initiate functional tasks with 50% of opportunities with Mod A multimodal cueing  SLP Short Term Goal 4 - Progress (Week 4): Not met SLP Short Term Goal 5 (Week 4): Pt will consume Dys. 1 textures with honey-thick liquids with minimal overt s/s of aspiration with Max A verbal cues for utilization of small bites/sips.  SLP Short Term Goal 5 - Progress (Week 4): Met  New Short-Term Goals:  Week 5: SLP Short Term Goal 1 (Week 5): Pt will respond to yes/no questions in regards to biographical information with gestures/verbalizations with 50% of opportunities SLP Short Term Goal 2 (Week 5): Pt will follow 1 step commands with 75% of opportunities with Mod A multimodal cueing  SLP Short Term Goal 3 (Week 5): Pt will demonstrate sustained attention during a functioanl task for 3 minutes with Max A verbal cues for redirection  SLP Short Term Goal 4 (Week 5): pt will initiate functional tasks with 50% of opportunities with Mod A multimodal cueing  SLP Short Term Goal 5  (Week 5): Pt will consume Dys. 2 textures with honey-thick liquids with minimal overt s/s of aspiration with Max A verbal cues for utilization of small bites/sips.  SLP Short Term Goal 6 (Week 5): Pt will consume trials of nectar-thick liquids without overt s/s of aspiration with Max A multimodal cueing  Weekly Progress Updates: Pt continues to make functional but inconsistent gains and has met 1 of 5 STG's this reporting period. Currently, pt is consuming Dys. 2 textures with honey-thick liquids with minimal overt s/s of aspiration but requires Max-Total A multimodal cueing for small bites and slow pace. Pt is also demonstrating behaviors consistent with a Rancho Level IV and requires Max-total A for sustained attention, safety awareness, functional problem solving, intellectual awareness and initiation. Pt also demonstrates increased restlessness and can be resistive at times. Pt continues to be nonverbal and requires Max-Total A to answer yes/no questions with head nods in less than 25% of opportunities.  Pt would benefit from continued skilled SLP intervention to maximize cognitive-linguistic and swallowing function and maximize overall functional independence.    SLP Intensity: Minumum of 1-2 x/day, 30 to 90 minutes SLP Frequency: 5 out of 7 days SLP Duration/Estimated Length of Stay: TBD due to SNF placement  SLP Treatment/Interventions: Cueing hierarchy;Cognitive remediation/compensation;Environmental controls;Internal/external aids;Therapeutic Exercise;Speech/Language facilitation;Therapeutic Activities;Patient/family education;Dysphagia/aspiration precaution training;Functional tasks  Daily Session Skilled Therapeutic Intervention:   Session 1: Treatment focus on dysphagia and cognitive-linguistic goals. SLP facilitated session by providing trials of nectar-thick liquids via cup.  Pt with intermittent throat clear X 2 with 4 oz. Pt required Max multimodal cueing for small sips and for focused  attention to trails. Pt with constant grimace on face and holding his stomach. Pt utilized a head nod to answer "yes" to stomach pain. RN made aware and medications given. Pt utilized head nods X 1 out of 10 questions in regards to functional information. Pt fatigued at end of session and sleeping in wheelchair. Continue plan of care.   Session 2: Pt missed 45 minutes of skilled SLP intervention due to fatigue and not feeling well. Pt asleep in enclosure bed and both the RN and PA asked this clinician to allow pt to rest.   FIM:  Comprehension Comprehension Mode: Auditory Comprehension: 1-Understands basic less than 25% of the time/requires cueing 75% of the time Expression Expression Mode: Nonverbal Expression: 1-Expresses basis less than 25% of the time/requires cueing greater than 75% of the time. Social Interaction Social Interaction: 1-Interacts appropriately less than 25% of the time. May be withdrawn or combative. Problem Solving Problem Solving: 1-Solves basic less than 25% of the time - needs direction nearly all the time or does not effectively solve problems and may need a restraint for safety Memory Memory: 1-Recognizes or recalls less than 25% of the time/requires cueing greater than 75% of the time FIM - Eating Eating Activity: 4: Help with managing cup/glass Pain Pt unable to report, however, with constant grimace on face and holding stomach. RN made aware and medication given.   Therapy/Group: Individual Therapy  Erika Slaby 10/13/2013, 12:06 PM

## 2013-10-14 DIAGNOSIS — IMO0002 Reserved for concepts with insufficient information to code with codable children: Secondary | ICD-10-CM

## 2013-10-14 DIAGNOSIS — I6200 Nontraumatic subdural hemorrhage, unspecified: Secondary | ICD-10-CM

## 2013-10-14 DIAGNOSIS — S0291XS Unspecified fracture of skull, sequela: Secondary | ICD-10-CM

## 2013-10-14 NOTE — Plan of Care (Signed)
Problem: RH PAIN MANAGEMENT Goal: RH STG PAIN MANAGED AT OR BELOW PT'S PAIN GOAL Patient will verbalize pain level and medicate as needed  Outcome: Not Progressing Non verbal

## 2013-10-14 NOTE — Progress Notes (Signed)
Cory [name] is a 32 y.o. male 28-Feb-1981 [id number]  Subjective: No  complaints. No new problems. Sleeping in the enclosure bed  Objective: Vital signs in last 24 hours: Pulse Rate:  [66-78] 66 (12/20 0530) BP: (119-120)/(60-64) 119/64 mmHg (12/20 0530) Weight:  [72 lb 5 oz (32.8 kg)] 72 lb 5 oz (32.8 kg) (12/20 0500) Weight change:  Last BM Date: 10/13/13  Intake/Output from previous day:   Last cbgs: CBG (last 3)   Recent Labs  10/12/13 2201  GLUCAP 99     Physical Exam General: No apparent distress   Thin HEENT: moist mucosa Lungs: Normal effort. Lungs clear to auscultation, no crackles or wheezes. Cardiovascular: Regular rate and rhythm, no edema Abdomen: S/NT/ND; BS(+) Musculoskeletal:  No change from before Neurological: No new neurological deficits Wounds: N/A    Skin: clear Asleep, not cooperative   Lab Results: BMET    Component Value Date/Time   NA 142 09/14/2013 0545   K 3.7 09/14/2013 0545   CL 105 09/14/2013 0545   CO2 26 09/14/2013 0545   GLUCOSE 84 09/14/2013 0545   BUN 15 09/14/2013 0545   CREATININE 0.64 10/11/2013 0615   CALCIUM 8.5 09/14/2013 0545   GFRNONAA >90 10/11/2013 0615   GFRAA >90 10/11/2013 0615   CBC    Component Value Date/Time   WBC 11.8* 10/03/2013 1205   RBC 3.35* 10/03/2013 1205   HGB 10.1* 10/03/2013 1205   HCT 30.3* 10/03/2013 1205   PLT 299 10/03/2013 1205   MCV 90.4 10/03/2013 1205   MCH 30.1 10/03/2013 1205   MCHC 33.3 10/03/2013 1205   RDW 15.3 10/03/2013 1205   LYMPHSABS 1.2 10/03/2013 1205   MONOABS 0.9 10/03/2013 1205   EOSABS 0.1 10/03/2013 1205   BASOSABS 0.1 10/03/2013 1205    Studies/Results: No results found.  Medications: I have reviewed the patient's current medications.  Assessment/Plan:  1. DVT Prophylaxis/Anticoagulation: Pharmaceutical: Lovenox  2. Pain Management: Prn oxycodone for outward signs of pain.  3. Mood: Currently too low level to evaluate.  4. Neuropsych: This patient is not  capable of making decisions on his own behalf.  -reduced amantadine  -more direct, but limited, interaction with staff on very basic level  5. Recurrent Fever: afebrile white count declining, recheck  -wound culture with MRSA  -PEG site cultured negative  -left leg recultured--results pending. --resumed bactrim--continue for now  -local wound care. Left shin improving.  6. Left tibial plateau Fx--s/p ORIF: PWB in KI (not really practical). ---essentially will be non-weight bearing  7. Left humerus Fx--s/p ORIF: NWB- may be out of splint while in bed  -HO and MO surrounding injury site  8. Leucocytosis: improving  9. Left shin and heel wound: Continue bilateral PRAFO. Pressure relief measures-- WOC assistance appreciated  10. ABLA: hgb rechecks  11. Dysphagia: Continue TF for supplement. Continue D1, honey liquids with assistance.  12. Agitation: seroquel- decreased to hs only, continue inderal and zoloft,  ativan prn. [place] bed for safety remains indicated  -there is some increase in his agitation but i don't wish to increase his medications a whole lot given the negative neuro-cognitive effects of these meds.-- will increase inderal slightly as bp and hr can tolerate  -treat pain.  Cont Rx    Length of stay, days: 31  Sonda Primes , MD 10/14/2013, 8:45 AM

## 2013-10-15 ENCOUNTER — Inpatient Hospital Stay (HOSPITAL_COMMUNITY): Payer: Medicaid Other

## 2013-10-15 DIAGNOSIS — I609 Nontraumatic subarachnoid hemorrhage, unspecified: Secondary | ICD-10-CM

## 2013-10-15 DIAGNOSIS — S069X9S Unspecified intracranial injury with loss of consciousness of unspecified duration, sequela: Secondary | ICD-10-CM

## 2013-10-15 NOTE — Progress Notes (Signed)
Occupational Therapy Note  Patient Details  Name: Cory [name] MRN: [id number] Date of Birth: 12/13/1980 Today's Date: 10/15/2013  Pt missed 15 mins skilled OT services.  Pt asleep in recliner at nurses station.  Multiple attempts to arouse patient unsuccessful.  Pt would open eyes but unable to actively engaged in therapeutic tasks. Pt remained in recliner at nurses station.    Lavone Neri [facility] 10/15/2013, 11:41 AM

## 2013-10-15 NOTE — Progress Notes (Signed)
Cory [name] is a 32 y.o. male Oct 16, 1981 [id number]  Subjective: No  complaints. No new problems. Sleeping in the enclosure bed. Agitated at times  Objective: Vital signs in last 24 hours: Pulse Rate:  [78-85] 78 (12/20 1944) Resp:  [19] 19 (12/20 1553) BP: (110-116)/(84-92) 116/84 mmHg (12/20 1944) Weight change:  Last BM Date: 10/14/13  Intake/Output from previous day: 12/20 0701 - 12/21 0700 In: -  Out: 400 [Urine:400] Last cbgs: CBG (last 3)   Recent Labs  10/12/13 2201  GLUCAP 99     Physical Exam General: No apparent distress   Thin HEENT: moist mucosa Lungs: Normal effort. Lungs clear to auscultation, no crackles or wheezes. Cardiovascular: Regular rate and rhythm, no edema Abdomen: S/NT/ND; BS(+) Musculoskeletal:  No change from before Neurological: No new neurological deficits. Agitated at times Wounds: N/A    Skin: clear Asleep, not cooperative when awake   Lab Results: BMET    Component Value Date/Time   NA 142 09/14/2013 0545   K 3.7 09/14/2013 0545   CL 105 09/14/2013 0545   CO2 26 09/14/2013 0545   GLUCOSE 84 09/14/2013 0545   BUN 15 09/14/2013 0545   CREATININE 0.64 10/11/2013 0615   CALCIUM 8.5 09/14/2013 0545   GFRNONAA >90 10/11/2013 0615   GFRAA >90 10/11/2013 0615   CBC    Component Value Date/Time   WBC 11.8* 10/03/2013 1205   RBC 3.35* 10/03/2013 1205   HGB 10.1* 10/03/2013 1205   HCT 30.3* 10/03/2013 1205   PLT 299 10/03/2013 1205   MCV 90.4 10/03/2013 1205   MCH 30.1 10/03/2013 1205   MCHC 33.3 10/03/2013 1205   RDW 15.3 10/03/2013 1205   LYMPHSABS 1.2 10/03/2013 1205   MONOABS 0.9 10/03/2013 1205   EOSABS 0.1 10/03/2013 1205   BASOSABS 0.1 10/03/2013 1205    Studies/Results: No results found.  Medications: I have reviewed the patient's current medications.  Assessment/Plan:  1. DVT Prophylaxis/Anticoagulation: Pharmaceutical: Lovenox  2. Pain Management: Prn oxycodone for outward signs of pain.  3. Mood: Currently too  low level to evaluate.  4. Neuropsych: This patient is not capable of making decisions on his own behalf.  -reduced amantadine  -more direct, but limited, interaction with staff on very basic level  5. Recurrent Fever: afebrile white count declining, recheck  -wound culture with MRSA  -PEG site cultured negative  -left leg recultured--results pending. --resumed bactrim--continue for now  -local wound care. Left shin improving.  6. Left tibial plateau Fx--s/p ORIF: PWB in KI (not really practical). ---essentially will be non-weight bearing  7. Left humerus Fx--s/p ORIF: NWB- may be out of splint while in bed  -HO and MO surrounding injury site  8. Leucocytosis: improving  9. Left shin and heel wound: Continue bilateral PRAFO. Pressure relief measures-- WOC assistance appreciated  10. ABLA: hgb rechecks  11. Dysphagia: Continue TF for supplement. Continue D1, honey liquids with assistance.  12. Agitation: seroquel- decreased to hs only, continue inderal and zoloft,  ativan prn. [place] bed for safety remains indicated  -there is some increase in his agitation but i don't wish to increase his medications a whole lot given the negative neuro-cognitive effects of these meds.-- will increase inderal slightly as bp and hr can tolerate  -treat pain.  Cont Rx    Length of stay, days: 32  Sonda Primes , MD 10/15/2013, 9:54 AM

## 2013-10-15 NOTE — Progress Notes (Signed)
Physical Therapy Note  Patient Details  Name: Cory [name] MRN: [id number] Date of Birth: 29-Mar-1981 Today's Date: 10/15/2013  1020-1125 (45 minutes) individual Pain : no acute distress noted Focus of treatment : Therapeutic activities to facilitate focused attention Treatment: Pt in enclosed bed upon arrival; pt continues to change positions constantly; transfer scoot max assist + 1 , applied chair tray and posey belt; attempted to engage pt with various activities ( peg board, rainbow arc, rings)  Hand over hand ; pt required max tactile cues to prevent biting at objects; unable to attain any attention to task; returned to nurses station.    Jenayah Antu,JIM 10/15/2013, 11:02 AM

## 2013-10-16 ENCOUNTER — Inpatient Hospital Stay (HOSPITAL_COMMUNITY): Payer: Medicaid Other | Admitting: *Deleted

## 2013-10-16 ENCOUNTER — Inpatient Hospital Stay (HOSPITAL_COMMUNITY): Payer: Medicaid Other | Admitting: Speech Pathology

## 2013-10-16 ENCOUNTER — Encounter (HOSPITAL_COMMUNITY): Payer: Medicaid - Out of State | Admitting: *Deleted

## 2013-10-16 LAB — CBC WITH DIFFERENTIAL/PLATELET
Basophils Absolute: 0.2 10*3/uL — ABNORMAL HIGH (ref 0.0–0.1)
Basophils Relative: 2 % — ABNORMAL HIGH (ref 0–1)
Eosinophils Relative: 3 % (ref 0–5)
Hemoglobin: 11.7 g/dL — ABNORMAL LOW (ref 13.0–17.0)
Lymphs Abs: 1.8 10*3/uL (ref 0.7–4.0)
MCH: 29.7 pg (ref 26.0–34.0)
MCHC: 32.6 g/dL (ref 30.0–36.0)
MCV: 91.1 fL (ref 78.0–100.0)
Monocytes Absolute: 1.1 10*3/uL — ABNORMAL HIGH (ref 0.1–1.0)
Neutro Abs: 5.4 10*3/uL (ref 1.7–7.7)
Neutrophils Relative %: 62 % (ref 43–77)
Platelets: 215 10*3/uL (ref 150–400)
RBC: 3.94 MIL/uL — ABNORMAL LOW (ref 4.22–5.81)
RDW: 15.7 % — ABNORMAL HIGH (ref 11.5–15.5)

## 2013-10-16 NOTE — Progress Notes (Signed)
Occupational Therapy Session Note  Patient Details  Name: Cory [name] MRN: [id number] Date of Birth: December 20, 1980  Today's Date: 10/16/2013 Time: 8469-6295 Time Calculation (min): 41 min  Short Term Goals: Week 5:  OT Short Term Goal 1 (Week 5): Pt will don a shirt with mod A  OT Short Term Goal 2 (Week 5): Pt will descriminate between 2 grooming tools for correct purpose with mod cuing with extra time OT Short Term Goal 3 (Week 5): Pt will transfer squat pivot with min A  OT Short Term Goal 4 (Week 5): Pt will sustain attention for 2 min during functional task  Skilled Therapeutic Interventions/Progress Updates:    Pt engaged in UB dressing and grooming tasks.  Focus on task initiation, sequencing, and attention to task.  Pt exhibited restlessness throughout session and continually shifted weight while seated in w/c. Pt initiated donning shirt when presented with shirt but required assistance to complete tasks (pt completed task with mod A).  Pt placed wash cloth in mouth when presented with cloth.    Therapy Documentation Precautions:  Precautions Precautions: Fall Precaution Comments:  L LE KI on during movement, ok to be off in bed and sitting in w/c Required Braces or Orthoses: Knee Immobilizer - Left Knee Immobilizer - Left: On when out of bed or walking Other Brace/Splint: Bil PRAFOs and L UE and LE splint can be removed for ROM with Ot/PT only.   Restrictions Weight Bearing Restrictions: Yes LUE Weight Bearing: Non weight bearing LLE Weight Bearing: Touchdown weight bearing LLE Partial Weight Bearing Percentage or Pounds: 50%   Pain: Pain Assessment Pain Assessment: Faces Faces Pain Scale: No hurt  See FIM for current functional status  Therapy/Group: Individual Therapy  Rich Brave 10/16/2013, 8:42 AM
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
## 2013-10-16 NOTE — Progress Notes (Signed)
Physical Therapy Session Note  Patient Details  Name: Maciej Schweitzer MRN: [id number] Date of Birth: 11-Sep-1981  Today's Date: 10/16/2013 Time: 1000-1055 and 1400 ([place] with SLP (CP) [phone]  Time Calculation (min): 55 min and 45 min  Short Term Goals: Week 4:  PT Short Term Goal 1 (Week 4): Patient will follow simple one step commands with functional mobility with modA. PT Short Term Goal 2 (Week 4): Patient will assist with transfers 50% of the time with modA. PT Short Term Goal 3 (Week 4): Patient will demonstrate sustained attention to gait training x1 min with modA.  Skilled Therapeutic Interventions/Progress Updates:    AM Session: Patient received sitting in wheelchair. Session focused on purposeful reaction to stimuli. Attempted to engage patient in various activities to provide opportunities for purposeful responses (washing hands, games, ball, music/radio player, ambulation). Patient presents unengaged entire session while maintaining eyes closed and putting head down on anything in front of him (table, sink, etc.). Patient requires [facility] assist for all activities and does not actively participate/engage in any. Only activity patient initiates is attempting to scoot himself forward in order to rest head on something (table, sink, etc.). Patient appearing lethargic and unengaged. Patient returned to room and left supine in enclosure bed. Patient appropriately positioning pillow and blanket for comfort.  PM Session: Patient received supine in bed, incontinent of bowel. Session focused on introduction of stimuli and self-care with showering. Patient covered in BM with decreased awareness of BM on hands and then on face. Patient transferred into roll in shower chair with max A. Focus on sustaining a seated position on shower chair, introduction and tolerance of warm shower with water on him beginning with introducing stimulus on bilateral LEs. Patient tolerated water and being washed  without resistance or biting. Patient able to grab hand held shower head to get himself wet and keep warm. Patient with difficulty maintaining warm body temperature in warm shower. Patient assisted with donning gown after shower. Patient performed sit to stand with simple command for LB clothing management with max A with more than reasonable amt of time. Transitioned to day room with patient seated in wheelchair for co-treatment with SLP to focus on cognitive and dysphagia goals. Patient performed sorting task from field of 2. Patient also required hand over hand assist for utilization of small sips with honey-thick liquids and demonstrated intermittent throat clearing. Patient nonverbal throughout the session and followed 1 step commands with 25% of opportunities. Patient left seated in wheelchair with Posey belt with nursing for bandage changes.   Therapy Documentation Precautions:  Precautions Precautions: Fall Precaution Comments:  L LE KI on during movement, ok to be off in bed and sitting in w/c Required Braces or Orthoses: Knee Immobilizer - Left Knee Immobilizer - Left: On when out of bed or walking Other Brace/Splint: Bil PRAFOs and L UE and LE splint can be removed for ROM with Ot/PT only.   Restrictions Weight Bearing Restrictions: Yes LUE Weight Bearing: Non weight bearing LLE Weight Bearing: Touchdown weight bearing LLE Partial Weight Bearing Percentage or Pounds: 50% Pain: Pain Assessment Pain Assessment: Faces Faces Pain Scale: No hurt Locomotion : Ambulation Ambulation/Gait Assistance: 1: +2 Total assist   See FIM for current functional status  Therapy/Group: Individual Therapy in AM and Co-Treatment with SLP in PM  [place] S Graycie Halley S. Remee Charley, PT, DPT 10/16/2013, 12:01 PM

## 2013-10-16 NOTE — Progress Notes (Signed)
Recreational Therapy Session Note  Patient Details  Name: Cory [name] MRN: [id number] Date of Birth: 1981-03-23 Today's Date: 10/16/2013  Referral from team; order received, chart reviewed, & informally observed pt with therapy sessions.  Pt not yet appropriate for TR services even as co-treatment.  Will continue to monitor for future participation.  Yanitza Shvartsman 10/16/2013, 12:11 PM

## 2013-10-16 NOTE — Progress Notes (Signed)
Speech Language Pathology Daily Session Notes  Patient Details  Name: Cory [name] MRN: [id number] Date of Birth: 01/09/1981  Today's Date: 10/16/2013  Session 1 Time: 0865-7846 Time Calculation (min): 30 min  Session 2 Time: 1445-1500 Time Calculation: 15 min  Short Term Goals: Week 5: SLP Short Term Goal 1 (Week 5): Pt will respond to yes/no questions in regards to biographical information with gestures/verbalizations with 50% of opportunities SLP Short Term Goal 2 (Week 5): Pt will follow 1 step commands with 75% of opportunities with Mod A multimodal cueing  SLP Short Term Goal 3 (Week 5): Pt will demonstrate sustained attention during a functioanl task for 3 minutes with Max A verbal cues for redirection  SLP Short Term Goal 4 (Week 5): pt will initiate functional tasks with 50% of opportunities with Mod A multimodal cueing  SLP Short Term Goal 5 (Week 5): Pt will consume Dys. 2 textures with honey-thick liquids with minimal overt s/s of aspiration with Max A verbal cues for utilization of small bites/sips.  SLP Short Term Goal 6 (Week 5): Pt will consume trials of nectar-thick liquids without overt s/s of aspiration with Max A multimodal cueing  Skilled Therapeutic Interventions:  Session 1: Treatment focus on dysphagia and cognitive goals. Upon arrival, pt awake in enclosure bed. Pt was incontinent of urine and required total A for hygiene. Pt was then transferred to wheelchair with total A.  SLP facilitated session by providing total A for self-feeding of Dys. 2 textures with honey-thick liquids. Pt's meat appeared to look more like Dys. 3 textures and pt demonstrated prolonged mastication, therefore, meat was removed from tray. Pt consumed ~25% of tray and required total A for focused attention to self-feeding due to constant moving/restlessness throughout the session.  Pt also required hand over hand assist for utilization of small sips with honey-thick liquids and demonstrated  intermittent throat clearing. Pt nonverbal throughout the session and followed 1 step commands with 25% of opportunities.  Continue plan of care.   Session 2: Co-treatment with PT with focus on cognition goals. SLP facilitated session by prpviding max encouragement for participation in card sorting task from field of 2. Pt completed with 100% accuracy X 2 attempts (chance accuracy). Patient also required hand over hand assist for utilization of small sips with honey-thick liquids and demonstrated intermittent throat clearing. Patient nonverbal throughout the session and followed 1 step commands with 25% of opportunities. Pt's overall bite reflex seemed stronger throughout the session and required Max-Total A to not bring inappropriate items to his oral cavity. Patient left seated in wheelchair with Posey belt with nursing for bandage changes.  FIM:  Comprehension Comprehension Mode: Auditory Comprehension: 1-Understands basic less than 25% of the time/requires cueing 75% of the time Expression Expression Mode: Nonverbal Expression: 1-Expresses basis less than 25% of the time/requires cueing greater than 75% of the time. Social Interaction Social Interaction: 1-Interacts appropriately less than 25% of the time. May be withdrawn or combative. Problem Solving Problem Solving: 1-Solves basic less than 25% of the time - needs direction nearly all the time or does not effectively solve problems and may need a restraint for safety Memory Memory: 1-Recognizes or recalls less than 25% of the time/requires cueing greater than 75% of the time FIM - Eating Eating Activity: 4: Help with managing cup/glass;4: Helper occasionally scoops food on utensil;4: Helper occasionally brings food to mouth  Pain Pt with intermittent grimace throughout the session, RN made aware.   Therapy/Group: Individual Therapy  Nena Hampe  10/16/2013, 1:10 PM

## 2013-10-17 ENCOUNTER — Inpatient Hospital Stay (HOSPITAL_COMMUNITY): Payer: Medicaid Other | Admitting: *Deleted

## 2013-10-17 ENCOUNTER — Encounter (HOSPITAL_COMMUNITY): Payer: Medicaid - Out of State

## 2013-10-17 ENCOUNTER — Inpatient Hospital Stay (HOSPITAL_COMMUNITY): Payer: Medicaid Other | Admitting: Speech Pathology

## 2013-10-17 DIAGNOSIS — I69991 Dysphagia following unspecified cerebrovascular disease: Secondary | ICD-10-CM

## 2013-10-17 DIAGNOSIS — S069X9A Unspecified intracranial injury with loss of consciousness of unspecified duration, initial encounter: Secondary | ICD-10-CM

## 2013-10-17 DIAGNOSIS — IMO0002 Reserved for concepts with insufficient information to code with codable children: Secondary | ICD-10-CM

## 2013-10-17 MED ORDER — RESOURCE THICKENUP CLEAR PO POWD
ORAL | Status: DC | PRN
  Filled 2013-10-17 (×3): qty 125

## 2013-10-17 MED ORDER — SACCHAROMYCES BOULARDII 250 MG PO CAPS
500.0000 mg | ORAL_CAPSULE | Freq: Two times a day (BID) | ORAL | Status: DC
  Administered 2013-10-17 – 2013-11-08 (×44): 500 mg via ORAL
  Filled 2013-10-17 (×47): qty 2
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
## 2013-10-17 NOTE — Progress Notes (Signed)
Occupational Therapy Session Note  Patient Details  Name: Cory [name] MRN: [id number] Date of Birth: 1981-07-30  Today's Date: 10/17/2013  Session 1 Time: 1000-1045 Time Calculation (min): 45 min  Short Term Goals: Week 5:  OT Short Term Goal 1 (Week 5): Pt will don a shirt with mod A  OT Short Term Goal 2 (Week 5): Pt will descriminate between 2 grooming tools for correct purpose with mod cuing with extra time OT Short Term Goal 3 (Week 5): Pt will transfer squat pivot with min A  OT Short Term Goal 4 (Week 5): Pt will sustain attention for 2 min during functional task  Skilled Therapeutic Interventions/Progress Updates:    Focus on dressing and grooming tasks with emphasis on task initiation, task completion, following one step commands, and attention to task.  Although patient did not respond to one step commands to engaged in dressing tasks pt did initiate donning shirt when presented.  Pt donned shirt with min A when presented with clothing.  Pt threaded one pants leg when clothing presented but required assistance with threading the other.  Pt attempted pulling up pants while seated.  Pt required max A for standing while second person pulled up pants.  Pt placed wash cloth in mouth when presented to wash face. Attempted HOH assist to wash face but patient continually threw wash cloth on floor.  Pt did not follow one step commands throughout session.   Therapy Documentation Precautions:  Precautions Precautions: Fall Precaution Comments:  L LE KI on during movement, ok to be off in bed and sitting in w/c Required Braces or Orthoses: Knee Immobilizer - Left Knee Immobilizer - Left: On when out of bed or walking Other Brace/Splint: Bil PRAFOs and L UE and LE splint can be removed for ROM with Ot/PT only.   Restrictions Weight Bearing Restrictions: Yes LUE Weight Bearing: Non weight bearing LLE Weight Bearing: Partial weight bearing LLE Partial Weight Bearing Percentage or  Pounds: 50% Pain: Pain Assessment Pain Assessment: Faces Pain Score: 0-No pain  See FIM for current functional status  Therapy/Group: Individual Therapy  Session 2 Time: 6578-4696 (cotx with PT-total time 1300-1342) Pt did not display any s/s of pain throughout session cotx with PT  Initial focus on changing brief (pt incontinent of bladder). After multiple attempts to engage patient in donning underpants pt threaded his left leg and required assistance with threading right leg.  Pt independently bridged in bed to pull pants up.  Pt transitioned to therapy gym and engaged in going up and own steps. Pt initiated taking steps but required tot A + 2 to navigate steps.  Attempted to engage patient in catching and tossing large ball.  Pt initially would hit the ball with his LUE but further into the activity the patient crossed his arms and did not attend to task.    Lavone Neri [facility] 10/17/2013, 10:48 AM
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
## 2013-10-17 NOTE — Progress Notes (Signed)
Physical Therapy Session Note  Patient Details  Name: Cory [name] MRN: [id number] Date of Birth: Nov 04, 1980  Today's Date: 10/17/2013 Time: 0830-0920 and 1300-1330 (cotreat with OT (TL) 1191-4782) Time Calculation (min): 50 min and 30 min  Short Term Goals: Week 4:  PT Short Term Goal 1 (Week 4): Patient will follow simple one step commands with functional mobility with modA. PT Short Term Goal 2 (Week 4): Patient will assist with transfers 50% of the time with modA. PT Short Term Goal 3 (Week 4): Patient will demonstrate sustained attention to gait training x1 min with modA.  Skilled Therapeutic Interventions/Progress Updates:    AM Session: Patient received supine in bed. Transferred into w/c with total A +1. Seated in front of sink and mirror, patient provided opportunities for purposeful response to wash face, wash hands, comb hair, putting shoes on, etc. Patient followed 0% of commands, requires HOH assist, and with repeated instances of putting head down on sink. In therapy gym, assessed patient's automatic response to stimuli, throwing ball at patient. Patient maintains eyes closed and makes no attempt to catch ball or even open eyes to see ball coming towards him. Patient not engaging during session, no eye contact, not attending to name being called. Patient returned to RN station seated in wheelchair with posey belt, RN aware.  PM Session: Patient received supine in bed, incontinent of bladder. +2 to clean up, patient able to initiate pulling pants up via bridging after B LEs threaded for him. Patient does not respond to ball being tossed, maintaining eyes closed and crossing arms across chest. Patient negotiated 3 steps with +2 assist. Patient uses L UE on handrail. Patient initiating stepping up onto step each time as well as walking across the steps and initiating stepping down. Patient continues to be unengaged throughout session, however is demonstrating preferences by removing  therapists' hands when touching him. Patient left sitting in wheelchair at RN station with Posey belt donned, RN aware.  Therapy Documentation Precautions:  Precautions Precautions: Fall Precaution Comments:  L LE KI on during movement, ok to be off in bed and sitting in w/c Required Braces or Orthoses: Knee Immobilizer - Left Knee Immobilizer - Left: On when out of bed or walking Restrictions Weight Bearing Restrictions: Yes LUE Weight Bearing: Non weight bearing LLE Weight Bearing: Partial weight bearing LLE Partial Weight Bearing Percentage or Pounds: 50% Locomotion : Ambulation Ambulation/Gait Assistance: Not tested (comment)   See FIM for current functional status  Therapy/Group: Individual Therapy  Chipper Herb. Torri Langston, PT, DPT 10/17/2013, 9:26 AM

## 2013-10-17 NOTE — Progress Notes (Signed)
Speech Language Pathology Daily Session Note  Patient Details  Name: Cory [name] MRN: [id number] Date of Birth: June 12, 1981  Today's Date: 10/17/2013 Time: 8295-6213 Time Calculation (min): 45 min  Short Term Goals: Week 5: SLP Short Term Goal 1 (Week 5): Pt will respond to yes/no questions in regards to biographical information with gestures/verbalizations with 50% of opportunities SLP Short Term Goal 2 (Week 5): Pt will follow 1 step commands with 75% of opportunities with Mod A multimodal cueing  SLP Short Term Goal 3 (Week 5): Pt will demonstrate sustained attention during a functioanl task for 3 minutes with Max A verbal cues for redirection  SLP Short Term Goal 4 (Week 5): pt will initiate functional tasks with 50% of opportunities with Mod A multimodal cueing  SLP Short Term Goal 5 (Week 5): Pt will consume Dys. 2 textures with honey-thick liquids with minimal overt s/s of aspiration with Max A verbal cues for utilization of small bites/sips.  SLP Short Term Goal 6 (Week 5): Pt will consume trials of nectar-thick liquids without overt s/s of aspiration with Max A multimodal cueing  Skilled Therapeutic Interventions: Treatment focus on cognitive and dysphagia goals. Upon arrival, pt awake and sitting up in wheelchair. Pt did not follow any 1 step commands and did not answer any yes/no questions with gestures in regards to basic wants/needs. Pt consumed honey-thick liquids without overt s/s of aspiration but required total A for utilization of small sips. Pt with restlessness and constantly moving in his chair. Pt also frequently closing his eyes and pt transferred back to bed at end of session. Continue plan of care.    FIM:  Comprehension Comprehension Mode: Auditory Comprehension: 1-Understands basic less than 25% of the time/requires cueing 75% of the time Expression Expression Mode: Nonverbal Expression: 1-Expresses basis less than 25% of the time/requires cueing greater than  75% of the time. Social Interaction Social Interaction: 1-Interacts appropriately less than 25% of the time. May be withdrawn or combative. Problem Solving Problem Solving: 1-Solves basic less than 25% of the time - needs direction nearly all the time or does not effectively solve problems and may need a restraint for safety Memory Memory: 1-Recognizes or recalls less than 25% of the time/requires cueing greater than 75% of the time FIM - Eating Eating Activity: 4: Help with managing cup/glass;4: Helper occasionally scoops food on utensil;4: Helper occasionally brings food to mouth  Pain Pain Assessment Pain Assessment: Faces Pain Score: 0-No pain  Therapy/Group: Individual Therapy  Jay Haskew 10/17/2013, 11:58 AM

## 2013-10-17 NOTE — Progress Notes (Signed)
Subjective/Complaints:  No new substantial changes A  review of systems has been performed and if not noted above is otherwise negative. .  Objective: Vital Signs: Blood pressure 100/82, pulse 75, temperature 98.7 F (37.1 C), temperature source Oral, resp. rate 18, height 5' 2.99" (1.6 m), weight 33.3 kg (73 lb 6.6 oz), SpO2 98.00%. No results found.  Recent Labs  10/16/13 0600  WBC 8.8  HGB 11.7*  HCT 35.9*  PLT 215   No results found for this basename: NA, K, CL, CO, GLUCOSE, BUN, CREATININE, CALCIUM,  in the last 72 hours CBG (last 3)  No results found for this basename: GLUCAP,  in the last 72 hours  Wt Readings from Last 3 Encounters:  10/17/13 33.3 kg (73 lb 6.6 oz)  09/13/13 46.811 kg (103 lb 3.2 oz)  09/13/13 46.811 kg (103 lb 3.2 oz)    Physical Exam:  Constitutional:      Lethargic to restless, alternating, supine to sit as well as dual leg lifts,  Eyes: Right eye exhibits no discharge. Left eye exhibits no discharge.  Neck:  Janina Mayo out , stoma closed Cardiovascular: Normal rate and regular rhythm.  Respiratory: Effort normal.  GI: Soft. Stoma with scab no drainage minimal tenderness He has no cervical adenopathy.  Neurological:    Awake,distracted. Not following commands. Will make brief eye contact with me but attention is still limited. Musc:  Left arm flexes and extends no pain over healed incision  Skin: wounds are healing.   Assessment/Plan: 1. 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 Physiatrist is providing close team supervision and 24 hour management of active medical problems listed below. Physiatrist and rehab team continue to assess barriers to discharge/monitor patient progress toward functional and medical goals.       FIM: FIM - Bathing Bathing Steps Patient Completed:  (patient is scheduled for night  bathing) Bathing: 1: Two helpers  FIM - Upper Body Dressing/Undressing Upper body dressing/undressing steps patient completed: Thread/unthread right sleeve of pullover shirt/dresss;Thread/unthread left sleeve of pullover shirt/dress;Put head through opening of pull over shirt/dress Upper body dressing/undressing: 0: Activity did not occur FIM - Lower Body Dressing/Undressing Lower body dressing/undressing: 0: Wears gown/pajamas-no public clothing  FIM - Toileting Toileting: 0: No continent bowel/bladder events this shift  FIM - Archivist Transfers: 3-From toilet/BSC: Mod A (lift or lower assist);3-To toilet/BSC: Mod A (lift or lower assist)  FIM - Bed/Chair Transfer Bed/Chair Transfer Assistive Devices: Arm rests Bed/Chair Transfer: 2: Supine > Sit: Max A (lifting assist/Pt. 25-49%);2: Chair or W/C > Bed: Max A (lift and lower assist);2: Bed > Chair or W/C: Max A (lift and lower assist)  FIM - Locomotion: Wheelchair Distance: 30 Locomotion: Wheelchair: 1: Total Assistance/staff pushes wheelchair (Pt<25%) FIM - Locomotion: Ambulation Ambulation/Gait Assistance: Not tested (comment) Locomotion: Ambulation: 0: Activity did not occur  Comprehension Comprehension Mode: Auditory Comprehension: 1-Understands basic less than 25% of the time/requires cueing 75% of the time  Expression Expression Mode: Verbal Expression: 1-Expresses basis less than 25% of the time/requires cueing greater than 75% of the time.  Social Interaction Social Interaction: 1-Interacts appropriately less than 25% of the time. May be withdrawn or combative.  Problem Solving Problem Solving: 1-Solves basic less than 25% of the time - needs direction nearly all the time or does not effectively solve problems and may need a restraint for safety  Memory Memory: 1-Recognizes or recalls less than 25%  of the time/requires cueing greater than 75% of the time  Medical Problem List and Plan:  1. DVT  Prophylaxis/Anticoagulation: Pharmaceutical: Lovenox  2. Pain Management: Prn oxycodone for outward signs of pain.  3. Mood: Currently too low level to evaluate.  4. Neuropsych: This patient is not capable of making decisions on his own behalf.   -reduced amantadine  -more direct, but limited, interaction with staff on very basic level 5. Recurrent Fever: afebrile    -wounds improving 6. Left tibial plateau Fx--s/p ORIF: PWB in KI (not really practical). ---essentially will be non-weight bearing 7. Left humerus Fx--s/p ORIF: NWB- may be out of splint while in bed  -HO and MO surrounding injury site 8. Leucocytosis: improving 9. Left shin and heel wound: Continue bilateral PRAFO. Pressure relief measures-- WOC assistance appreciated 10. ABLA: hgb rechecks 11. Dysphagia: Continue TF for supplement. Continue D2, honey liquids with assistance.  12. Agitation: seroquel- decreased to hs only, continue inderal and zoloft,       ativan prn. [place] bed for safety remains indicated  -treat pain  LOS (Days) 34 A FACE TO FACE EVALUATION WAS PERFORMED  SWARTZ,ZACHARY T 10/17/2013 8:48 AM

## 2013-10-18 ENCOUNTER — Inpatient Hospital Stay (HOSPITAL_COMMUNITY): Payer: Medicaid Other | Admitting: *Deleted

## 2013-10-18 ENCOUNTER — Encounter (HOSPITAL_COMMUNITY): Payer: Medicaid - Out of State

## 2013-10-18 ENCOUNTER — Inpatient Hospital Stay (HOSPITAL_COMMUNITY): Payer: Medicaid - Out of State | Admitting: Physical Therapy

## 2013-10-18 ENCOUNTER — Inpatient Hospital Stay (HOSPITAL_COMMUNITY): Payer: Medicaid Other | Admitting: Speech Pathology

## 2013-10-18 LAB — BASIC METABOLIC PANEL
BUN: 8 mg/dL (ref 6–23)
CO2: 30 mEq/L (ref 19–32)
Chloride: 96 mEq/L (ref 96–112)
Creatinine, Ser: 0.71 mg/dL (ref 0.50–1.35)
Glucose, Bld: 84 mg/dL (ref 70–99)
Potassium: 4.6 mEq/L (ref 3.5–5.1)

## 2013-10-18 NOTE — Progress Notes (Signed)
Physical Therapy Session Note  Patient Details  Name: Cory [name] MRN: [id number] Date of Birth: November 06, 1980  Today's Date: 10/18/2013 Time: 0830-0925 Time Calculation (min): 55 min   Skilled Therapeutic Interventions/Progress Updates:    Pt following one step commands <10% of session. Attempted variety of tasks such as horse shoe grab/toss, ball catch/toss/kick, as well as simply taking an object from therapist's hand, none successful in maintaining pt's attention or eliciting voluntary response . Pt repeatedly reclining self in chair nearly sliding buttocks onto floor requiring +1 total to +2 total assist to keep from falling, soft waist restraint reapplied however pt still sliding out of chair. Pt responded best to propelling chair to and obtaining target of Magic Cup placed in various heights on left and right side.   Therapy Documentation Precautions:  Precautions Precautions: Fall Precaution Comments:  L LE KI on during movement, ok to be off in bed and sitting in w/c Required Braces or Orthoses: Knee Immobilizer - Left Knee Immobilizer - Left: On when out of bed or walking Other Brace/Splint: Bil PRAFOs and L UE and LE splint can be removed for ROM with Ot/PT only.   Restrictions Weight Bearing Restrictions: Yes LUE Weight Bearing: Non weight bearing LLE Weight Bearing: Partial weight bearing LLE Partial Weight Bearing Percentage or Pounds: 50 Pain: Pain Assessment Pain Assessment: Faces Pain Score: 2  Faces Pain Scale: Hurts a little bit Pain Intervention(s): RN made aware  See FIM for current functional status  Therapy/Group: Individual Therapy  Wilhemina Bonito 10/18/2013, 12:36 PM

## 2013-10-18 NOTE — Progress Notes (Signed)
Speech Language Pathology Daily Session Note  Patient Details  Name: Samay Delcarlo MRN: [id number] Date of Birth: 14-Nov-1980  Today's Date: 10/18/2013 Time: 8413-2440 Time Calculation (min): 45 min  Short Term Goals: Week 5: SLP Short Term Goal 1 (Week 5): Pt will respond to yes/no questions in regards to biographical information with gestures/verbalizations with 50% of opportunities SLP Short Term Goal 2 (Week 5): Pt will follow 1 step commands with 75% of opportunities with Mod A multimodal cueing  SLP Short Term Goal 3 (Week 5): Pt will demonstrate sustained attention during a functioanl task for 3 minutes with Max A verbal cues for redirection  SLP Short Term Goal 4 (Week 5): pt will initiate functional tasks with 50% of opportunities with Mod A multimodal cueing  SLP Short Term Goal 5 (Week 5): Pt will consume Dys. 2 textures with honey-thick liquids with minimal overt s/s of aspiration with Max A verbal cues for utilization of small bites/sips.  SLP Short Term Goal 6 (Week 5): Pt will consume trials of nectar-thick liquids without overt s/s of aspiration with Max A multimodal cueing  Skilled Therapeutic Interventions: Treatment focus on cognitive and dysphagia goals. Upon arrival, pt awake and sitting up in wheelchair. SLP facilitated session by providing skilled observation with current diet of Dys.2 textures with honey-thick liquids. Pt consumed 100% of meal without overt s/s of aspiration but required Mod verbal, visual and tactile cues for utilization of small bites/sips. Pt also required Mod verbal and tactile cueing to utilize R UE with self-feeding tasks. Pt followed 1 step commands with 25% of opportunities with self-feeding tasks and answered 0% of yes/no questions in regards to wants/needs.  Pt continues to demonstrate restlessness and is constantly pushing/pulling wheelchair to/from table. Pt required Max-Total A for focused attention to self-feeding.  Continue plan of care.    FIM:  Comprehension Comprehension Mode: Auditory Comprehension: 1-Understands basic less than 25% of the time/requires cueing 75% of the time Expression Expression Mode: Nonverbal Social Interaction Social Interaction: 1-Interacts appropriately less than 25% of the time. May be withdrawn or combative. Problem Solving Problem Solving: 1-Solves basic less than 25% of the time - needs direction nearly all the time or does not effectively solve problems and may need a restraint for safety Memory Memory: 1-Recognizes or recalls less than 25% of the time/requires cueing greater than 75% of the time FIM - Eating Eating Activity: 4: Help with managing cup/glass;4: Helper occasionally scoops food on utensil;4: Helper occasionally brings food to mouth  Pain No s/s of pain   Therapy/Group: Individual Therapy  Yetzali Weld 10/18/2013, 9:14 AM

## 2013-10-18 NOTE — Progress Notes (Signed)
Subjective/Complaints:  Occasionally increased agitation, biting observed. A  review of systems has been performed and if not noted above is otherwise negative. .  Objective: Vital Signs: Blood pressure 125/78, pulse 76, temperature 98.1 F (36.7 C), temperature source Axillary, resp. rate 18, height 5' 2.99" (1.6 m), weight 31.8 kg (70 lb 1.7 oz), SpO2 99.00%. No results found.  Recent Labs  10/16/13 0600  WBC 8.8  HGB 11.7*  HCT 35.9*  PLT 215    Recent Labs  10/18/13 0525  NA 135  K 4.6  CL 96  GLUCOSE 84  BUN 8  CREATININE 0.71  CALCIUM 8.5   CBG (last 3)   Recent Labs  10/17/13 2139  GLUCAP 106*    Wt Readings from Last 3 Encounters:  10/18/13 31.8 kg (70 lb 1.7 oz)  09/13/13 46.811 kg (103 lb 3.2 oz)  09/13/13 46.811 kg (103 lb 3.2 oz)    Physical Exam:  Constitutional:      Lethargic to restless, alternating, supine to sit as well as dual leg lifts,  Eyes: Right eye exhibits no discharge. Left eye exhibits no discharge.  Neck:  Janina Mayo out , stoma closed Cardiovascular: Normal rate and regular rhythm.  Respiratory: Effort normal.  GI: Soft. Stoma with scab no drainage minimal tenderness He has no cervical adenopathy.  Neurological:    Awake,distracted. Not following commands. Will make brief eye contact with me but attention is still limited. Musc:  Left arm flexes and extends no pain over healed incision  Skin: wounds are healing.   Assessment/Plan: 1. 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 Physiatrist is providing close team supervision and 24 hour management of active medical problems listed below. Physiatrist and rehab team continue to assess barriers to discharge/monitor patient progress toward functional and medical goals.       FIM: FIM - Bathing Bathing Steps Patient Completed:  (patient is scheduled for night  bathing) Bathing: 1: Two helpers  FIM - Upper Body Dressing/Undressing Upper body dressing/undressing steps patient completed: Thread/unthread right sleeve of pullover shirt/dresss;Thread/unthread left sleeve of pullover shirt/dress;Put head through opening of pull over shirt/dress Upper body dressing/undressing: 4: Min-Patient completed 75 plus % of tasks FIM - Lower Body Dressing/Undressing Lower body dressing/undressing steps patient completed: Thread/unthread right underwear leg Lower body dressing/undressing: 1: Two helpers  FIM - Toileting Toileting: 0: No continent bowel/bladder events this shift  FIM - Archivist Transfers: 3-From toilet/BSC: Mod A (lift or lower assist);3-To toilet/BSC: Mod A (lift or lower assist)  FIM - Bed/Chair Transfer Bed/Chair Transfer Assistive Devices: Arm rests Bed/Chair Transfer: 2: Supine > Sit: Max A (lifting assist/Pt. 25-49%);2: Bed > Chair or W/C: Max A (lift and lower assist)  FIM - Locomotion: Wheelchair Distance: 30 Locomotion: Wheelchair: 1: Total Assistance/staff pushes wheelchair (Pt<25%) FIM - Locomotion: Ambulation Ambulation/Gait Assistance: Not tested (comment) Locomotion: Ambulation: 0: Activity did not occur  Comprehension Comprehension Mode: Auditory Comprehension: 1-Understands basic less than 25% of the time/requires cueing 75% of the time  Expression Expression Mode: Nonverbal Expression: 1-Expresses basis less than 25% of the time/requires cueing greater than 75% of the time.  Social Interaction Social Interaction: 1-Interacts appropriately less than 25% of the time. May be withdrawn or combative.  Problem Solving Problem Solving: 1-Solves basic less than 25% of the time - needs direction nearly all the time or does not effectively solve problems and may need a restraint for safety  Memory Memory: 1-Recognizes or recalls less than 25% of the time/requires cueing greater than 75% of the time  Medical  Problem List and Plan:  1. DVT Prophylaxis/Anticoagulation: Pharmaceutical: Lovenox  2. Pain Management: Prn oxycodone for outward signs of pain.  3. Mood: Currently too low level to evaluate.  4. Neuropsych: This patient is not capable of making decisions on his own behalf.   -reduced amantadine  -some of agitation may be him acting out a little. As a whole he has shown some neurological improvement 5. Recurrent Fever: afebrile    -wounds improving 6. Left tibial plateau Fx--s/p ORIF: PWB in KI (not really practical). ---essentially will be non-weight bearing 7. Left humerus Fx--s/p ORIF: NWB- may be out of splint while in bed  -HO and MO surrounding injury site 8. Leucocytosis: improving 9. Left shin and heel wound: Continue bilateral PRAFO. Pressure relief measures-- WOC assistance appreciated 10. ABLA: hgb rechecks 11. Dysphagia: Continue TF for supplement. Continue D2, honey liquids with assistance.  12. Agitation: seroquel- decreased to hs only, continue inderal and zoloft,       ativan prn. [place] bed for safety remains indicated  -treat pain  LOS (Days) 35 A FACE TO FACE EVALUATION WAS PERFORMED  Neaveh Belanger T 10/18/2013 9:07 AM

## 2013-10-18 NOTE — Progress Notes (Signed)
Occupational Therapy Session Note  Patient Details  Name: Cory [name] MRN: [id number] Date of Birth: 1981-07-11  Today's Date: 10/18/2013  Session 1 Time: 1000-1030 Time Calculation (min): 30 min  Short Term Goals: Week 5:  OT Short Term Goal 1 (Week 5): Pt will don a shirt with mod A  OT Short Term Goal 2 (Week 5): Pt will descriminate between 2 grooming tools for correct purpose with mod cuing with extra time OT Short Term Goal 3 (Week 5): Pt will transfer squat pivot with min A  OT Short Term Goal 4 (Week 5): Pt will sustain attention for 2 min during functional task  Skilled Therapeutic Interventions/Progress Updates:    Engaged patient in doffing shirt and donning shirt and under shorts.  Attempted to engage patient with applying deodorant.  Pt persistently attmepted to place deodorant in his mouth even after [facility] assist provided to apply. Pt initiated threading arms into sleeves when presented clothing but placed arms through incorrect arm holes.  Pt required tot A to correct before continuing with task. Pt exhibited increased restlessness this morning and required tot A X 5 to sit back into w/c.  Pt returned to enclosure bed at end of session and session terminated early secondary to patient's increased restlessness.  Therapy Documentation Precautions:  Precautions Precautions: Fall Precaution Comments:  L LE KI on during movement, ok to be off in bed and sitting in w/c Required Braces or Orthoses: Knee Immobilizer - Left Knee Immobilizer - Left: On when out of bed or walking Other Brace/Splint: Bil PRAFOs and L UE and LE splint can be removed for ROM with Ot/PT only.   Restrictions Weight Bearing Restrictions: Yes LUE Weight Bearing: Non weight bearing LLE Weight Bearing: Partial weight bearing LLE Partial Weight Bearing Percentage or Pounds: 50 General: General Amount of Missed OT Time (min): 15 Minutes   Pain: Pain Assessment Pain Assessment: Faces Pain Score: 2   Faces Pain Scale: Hurts a little bit Pain Intervention(s): RN made aware  See FIM for current functional status  Therapy/Group: Individual Therapy  Session 2 Time: 1330-1345(cotx with PT-total time 1300-1345) Pt did not exhibit any s/s of pain during session Cotx with Physical therapist  Initial focus on engaging patient in purposeful activitiy - throwing ball, kicking ball, tossing horseshoe.  Pt perseverated on removing hospital gown and was increasingly restless throughout session.  Pt did "bat/hit" at ball when tossed and inconsistently tossed horseshoe when presented but was unable to sustain attention greater than 15 seconds.  Pt amb approx 5 steps X 2 with total A + 2 (three musketeers style). Pt returned to bed to change brief and offered [employer].  Pt attempted to eat while laying in bed but would sit up on EOB long enough to take a spoonful before laying back in bed.  Each time patient indicated he wanted another bite he responded to command to sit on EOB to take a spoonful.  Pt indicated he didn't want any more when he would not sit EOB.  Pt remained in enclosure bed at end of session. RN aware.  Lavone Neri [facility] 10/18/2013, 10:33 AM

## 2013-10-18 NOTE — Plan of Care (Signed)
Problem: RH SAFETY Goal: RH STG ADHERE TO SAFETY PRECAUTIONS W/ASSISTANCE/DEVICE STG Adhere to Safety Precautions With max Assistance/Device.  Outcome: Not Progressing Total assistance for safety/restraints

## 2013-10-18 NOTE — Progress Notes (Signed)
Physical Therapy Session Note  Patient Details  Name: Cory [name] MRN: [id number] Date of Birth: [birth year]  Today's Date: 10/18/2013 Time: 1300-1330 total co-treat time 13:00-13:45 Time Calculation (min): 30 min  Skilled Therapeutic Interventions/Progress Updates:  Co-treat with OT for engaging in purposeful movements, transfers, gait, and attention.  Pt following one step commands <10% of session.   Pt attempting to scoot out of chair for majority of tx, becoming restelss with gown. Pt did hit at ball when tossed 5/7 trials and inconsistently tossed horseshoe when put in hand, but was unable to sustain attention greater than 15 seconds. Gait training ~ 8' X 2 with total A + 2 (three musketeers style) pt able to advance bil LEs, L>R. Marland Kitchen Pt returned to bed to change brief, and would grab spoon for bites of magic cup when he would come to sit EOB.  Pt left in [place] bed with call bell in reach, nurse aware     Therapy Documentation Precautions:  Precautions Precautions: Fall Precaution Comments:  L LE KI on during movement, ok to be off in bed and sitting in w/c Required Braces or Orthoses: Knee Immobilizer - Left Knee Immobilizer - Left: On when out of bed or walking Other Brace/Splint: Bil PRAFOs and L UE and LE splint can be removed for ROM with Ot/PT only.   Restrictions Weight Bearing Restrictions: Yes LUE Weight Bearing: Non weight bearing LLE Weight Bearing: Partial weight bearing LLE Partial Weight Bearing Percentage or Pounds: 50 Pain: none apparent   Locomotion : Ambulation Ambulation/Gait Assistance: 1: +2 Total assist   See FIM for current functional status  Therapy/Group: Co-Treatment  Clydene Laming, PT, DPT  10/18/2013, 2:17 PM

## 2013-10-19 NOTE — Progress Notes (Signed)
Subjective/Complaints:  No new issues. A  review of systems has been performed and if not noted above is otherwise negative. .  Objective: Vital Signs: Blood pressure 146/69, pulse 78, temperature 98.2 F (36.8 C), temperature source Oral, resp. rate 18, height 5' 2.99" (1.6 m), weight 33.5 kg (73 lb 13.7 oz), SpO2 92.00%. No results found. No results found for this basename: WBC, HGB, HCT, PLT,  in the last 72 hours  Recent Labs  10/18/13 0525  NA 135  K 4.6  CL 96  GLUCOSE 84  BUN 8  CREATININE 0.71  CALCIUM 8.5   CBG (last 3)   Recent Labs  10/17/13 2139  GLUCAP 106*    Wt Readings from Last 3 Encounters:  10/19/13 33.5 kg (73 lb 13.7 oz)  09/13/13 46.811 kg (103 lb 3.2 oz)  09/13/13 46.811 kg (103 lb 3.2 oz)    Physical Exam:  Constitutional:      Lethargic to restless, alternating, supine to sit as well as dual leg lifts,  Eyes: Right eye exhibits no discharge. Left eye exhibits no discharge.  Neck:  Janina Mayo out , stoma closed Cardiovascular: Normal rate and regular rhythm.  Respiratory: Effort normal.  GI: Soft. Stoma with scab no drainage minimal tenderness He has no cervical adenopathy.  Neurological:    Awake. Remains distracted. Will make brief eye contact with me but attention is still limited. Musc:  Left arm flexes and extends.   Skin: wounds are healing.   Assessment/Plan: 1. 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 Physiatrist is providing close team supervision and 24 hour management of active medical problems listed below. Physiatrist and rehab team continue to assess barriers to discharge/monitor patient progress toward functional and medical goals.       FIM: FIM - Bathing Bathing Steps Patient Completed:  (patient is scheduled for night bathing) Bathing: 1: Two helpers  FIM - Upper Body Dressing/Undressing Upper  body dressing/undressing steps patient completed: Thread/unthread right sleeve of pullover shirt/dresss;Thread/unthread left sleeve of pullover shirt/dress;Put head through opening of pull over shirt/dress Upper body dressing/undressing: 4: Min-Patient completed 75 plus % of tasks FIM - Lower Body Dressing/Undressing Lower body dressing/undressing steps patient completed: Thread/unthread right underwear leg Lower body dressing/undressing: 1: Two helpers  FIM - Toileting Toileting: 0: No continent bowel/bladder events this shift  FIM - Archivist Transfers: 3-From toilet/BSC: Mod A (lift or lower assist);3-To toilet/BSC: Mod A (lift or lower assist)  FIM - Bed/Chair Transfer Bed/Chair Transfer Assistive Devices: Arm rests Bed/Chair Transfer: 2: Supine > Sit: Max A (lifting assist/Pt. 25-49%);4: Sit > Supine: Min A (steadying pt. > 75%/lift 1 leg);1: Bed > Chair or W/C: Total A (helper does all/Pt. < 25%)  FIM - Locomotion: Wheelchair Distance: 30 Locomotion: Wheelchair: 1: Total Assistance/staff pushes wheelchair (Pt<25%) FIM - Locomotion: Ambulation Locomotion: Ambulation Assistive Devices: Other (comment) (Two person ) Ambulation/Gait Assistance: 1: +2 Total assist Locomotion: Ambulation: 1: Two helpers  Comprehension Comprehension Mode: Auditory Comprehension: 1-Understands basic less than 25% of the time/requires cueing 75% of the time  Expression Expression Mode: Nonverbal Expression: 1-Expresses basis less than 25% of the time/requires cueing greater than 75% of the time.  Social Interaction Social Interaction: 1-Interacts appropriately less than 25% of the time. May be withdrawn or combative.  Problem Solving Problem Solving: 1-Solves basic less than 25% of the time - needs direction nearly all the time or does not effectively solve  problems and may need a restraint for safety  Memory Memory: 1-Recognizes or recalls less than 25% of the time/requires cueing  greater than 75% of the time  Medical Problem List and Plan:  1. DVT Prophylaxis/Anticoagulation: Pharmaceutical: Lovenox  2. Pain Management: Prn oxycodone for outward signs of pain.  3. Mood: Currently too low level to evaluate.  4. Neuropsych: This patient is not capable of making decisions on his own behalf.   -reduced amantadine  -some of agitation may be him acting out a little. As a whole he has shown some neurological improvement 5. Recurrent Fever: afebrile    -wounds improving 6. Left tibial plateau Fx--s/p ORIF: PWB in KI (not really practical). ---essentially will be non-weight bearing 7. Left humerus Fx--s/p ORIF: NWB- may be out of splint while in bed  -HO and MO surrounding injury site 8. Leucocytosis: improving 9. Left shin and heel wound: Continue bilateral PRAFO. Pressure relief measures-- WOC assistance appreciated 10. ABLA: hgb rechecks 11. Dysphagia: Continue TF for supplement. Continue D2, honey liquids with assistance.  12. Agitation: seroquel- decreased to hs only, continue inderal and zoloft,       ativan prn. [place] bed for safety remains indicated  -treat pain  LOS (Days) 36 A FACE TO FACE EVALUATION WAS PERFORMED  SWARTZ,ZACHARY T 10/19/2013 7:24 AM

## 2013-10-19 NOTE — Plan of Care (Signed)
Problem: RH SAFETY Goal: RH STG ADHERE TO SAFETY PRECAUTIONS W/ASSISTANCE/DEVICE STG Adhere to Safety Precautions With max Assistance/Device.  Outcome: Progressing [place] bed for safety

## 2013-10-20 ENCOUNTER — Inpatient Hospital Stay (HOSPITAL_COMMUNITY): Payer: Medicaid Other | Admitting: Speech Pathology

## 2013-10-20 ENCOUNTER — Ambulatory Visit (HOSPITAL_COMMUNITY): Payer: Medicaid - Out of State

## 2013-10-20 ENCOUNTER — Inpatient Hospital Stay (HOSPITAL_COMMUNITY): Payer: Medicaid - Out of State

## 2013-10-20 ENCOUNTER — Inpatient Hospital Stay (HOSPITAL_COMMUNITY): Payer: Medicaid Other | Admitting: Occupational Therapy

## 2013-10-20 ENCOUNTER — Encounter (HOSPITAL_COMMUNITY): Payer: Medicaid - Out of State | Admitting: Occupational Therapy

## 2013-10-20 LAB — CBC WITH DIFFERENTIAL/PLATELET
Basophils Absolute: 0.2 10*3/uL — ABNORMAL HIGH (ref 0.0–0.1)
Eosinophils Absolute: 0.5 10*3/uL (ref 0.0–0.7)
Lymphocytes Relative: 26 % (ref 12–46)
Lymphs Abs: 2.3 10*3/uL (ref 0.7–4.0)
MCH: 30.3 pg (ref 26.0–34.0)
Monocytes Relative: 14 % — ABNORMAL HIGH (ref 3–12)
Neutrophils Relative %: 53 % (ref 43–77)
Platelets: 169 10*3/uL (ref 150–400)
RBC: 3.6 MIL/uL — ABNORMAL LOW (ref 4.22–5.81)
RDW: 15.5 % (ref 11.5–15.5)
WBC: 8.8 10*3/uL (ref 4.0–10.5)

## 2013-10-20 LAB — SEDIMENTATION RATE: Sed Rate: 41 mm/hr — ABNORMAL HIGH (ref 0–16)

## 2013-10-20 MED ORDER — NAPHAZOLINE HCL 0.1 % OP SOLN
2.0000 [drp] | Freq: Three times a day (TID) | OPHTHALMIC | Status: DC
  Filled 2013-10-20: qty 15

## 2013-10-20 MED ORDER — NAPHAZOLINE-PHENIRAMINE 0.025-0.3 % OP SOLN
2.0000 [drp] | Freq: Three times a day (TID) | OPHTHALMIC | Status: DC
  Administered 2013-10-20 – 2013-11-08 (×54): 2 [drp] via OPHTHALMIC
  Filled 2013-10-20 (×2): qty 15

## 2013-10-20 MED ORDER — CEPHALEXIN 250 MG PO CAPS
250.0000 mg | ORAL_CAPSULE | Freq: Four times a day (QID) | ORAL | Status: DC
  Administered 2013-10-20 – 2013-10-30 (×37): 250 mg via ORAL
  Filled 2013-10-20 (×43): qty 1

## 2013-10-20 NOTE — Progress Notes (Signed)
Occupational Therapy Weekly Progress Note  Patient Details  Name: Cory [name] MRN: [id number] Date of Birth: July 27, 1981  Today's Date: 10/20/2013 Time: 1030-1130 Time Calculation (min): 60 min  Patient has met 3 of 4 short term goals.  Pt continues to make slow and at times inconsistent progress since the last weekly progress note. Pt continues to require  max A- total A to attend to functional familiar tasks. Pt continues to remain nonverbal with the occasional head nod yes/ no.  Pt can don a shirt and pants with backward chaining with min to mod A. Pt has been introduced to a shower sitting in roll in shower chair and does tolerate environment with mod to  total A for bathing. Pt can transfer with min to mod A when attending to the task to a regular w/c. 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  Pt can feed himself with assistance to maintain Dys 2 textures on utensil due to impulsivity  but can feed himself Dys I textures with supervision. Pt will eat most meals but continues to have difficulty maintaining weight due to frequent loose BMs. Pt no longer requires splint on left UE but does wear KI with ambulation when attempted.   Patient continues to demonstrate the following deficits: muscle weakness, decreased cardiorespiratoy endurance, impaired timing and sequencing, unbalanced muscle activation, decreased coordination and decreased motor planning, decreased visual perceptual skills, decreased midline orientation, decreased initiation, decreased attention, decreased awareness, decreased problem solving, decreased safety awareness, decreased memory and delayed processing and decreased sitting balance, decreased standing balance, decreased postural control, hemiplegia, decreased balance strategies and difficulty maintaining precautions and therefore will continue  to benefit from skilled OT intervention to enhance overall performance with BADL and Reduce care partner burden.  Patient progressing toward long term goals..  Continue plan of care.  OT Short Term Goals Week 5:  OT Short Term Goal 1 (Week 5): Pt will don a shirt with mod A  OT Short Term Goal 1 - Progress (Week 5): Met OT Short Term Goal 2 (Week 5): Pt will descriminate between 2 grooming tools for correct purpose with mod cuing with extra time OT Short Term Goal 2 - Progress (Week 5): Progressing toward goal OT Short Term Goal 3 (Week 5): Pt will transfer squat pivot with min A  OT Short Term Goal 3 - Progress (Week 5): Met OT Short Term Goal 4 (Week 5): Pt will sustain attention for 2 min during functional task OT Short Term Goal 4 - Progress (Week 5): Met Week 6:  OT Short Term Goal 1 (Week 6): Pt will bathe with mod A at shower level with extra time OT Short Term Goal 2 (Week 6): Pt will perform 2/3 grooming tasks with mod cuing  OT Short Term Goal 3 (Week 6): Pt will demonstrate sustained attention for 3 min with mod A to a functional tasks OT Short Term Goal 4 (Week 6): Pt will consistently answer yes/no questions with 50% accuracy in an hr session   Skilled Therapeutic Interventions/Progress Updates:    1:1 self care retraining. Pt very restless with nursing prior to session. Returned to room to focus on following basic directions and answering yes/no questions with head nods with basic and familiar tasks. Pt follow directions to doff one article of clothing at a time and transferred into roll on shower chair. Pt engaged in bathing 7/10  parts with verbal and tactile cues for each body part. Pt able to use the hand held shower head to rise off body and then hand it back. Pt nodded yes when finished bathing and participating in drying off same parts he washed with more time allowed. Pt then was able to pick a shirt from 2 and don with out backwards chaining. Pt transferred into regular w/c  and completed LB dressing including pants, sock and shoe on right foot. Pt allow for sock and shoe to be donned on left foot. Pt responded no with head nod to pain in session. Pt participated in toothbrushing with HOH for initiation of task and pt could complete. Pt assisted with shaving with clippers and then allowed staff to complete. Pt brushed hair with mod cuing to continue until completion. Pt participated in functional ambulation in the hallway with +2 with a 3rd person with chair behind. Pt required max to total tactile cues to maintain upright trunk posture and A to properly advance left LE. Pt with internal rotation and presented with decr dorsi flexion in left ankle with stepping (with KI on). Pt transitioned to SLp at end of session. Today pt demonstrate some signs of Rancho Level V.   2nd session co treat with physical therapy 1345-1430 (billed 1400-1430) Focus on following one step directions, answering yes/no questions with head nods. Pt did indicate left eye discomfort- RN and PA aware. Pt's left LE warm to the touch- PA aware. Focus on self propelling w.c with right LE, attempting to type his name on computer (?vision difficulty inhibiting his ability), toilet transfer for attempting toilet trials, toileting sit to stand. Assisted with Lab attempting to take blood draw- no success. Pt returned to bed.   Therapy Documentation Precautions:  Precautions Precautions: Fall Precaution Comments:  L LE KI on during movement, ok to be off in bed and sitting in w/c Required Braces or Orthoses: Knee Immobilizer - Left Knee Immobilizer - Left: On when out of bed or walking Other Brace/Splint: Bil PRAFOs and L UE and LE splint can be removed for ROM with Ot/PT only.   Restrictions Weight Bearing Restrictions: Yes LUE Weight Bearing: Non weight bearing LLE Weight Bearing: Touchdown weight bearing LLE Partial Weight Bearing Percentage or Pounds: 50 Pain: Pain Assessment Faces Pain Scale: No  hurt  See FIM for current functional status  Therapy/Group: Individual Therapy  [name] 10/20/2013, 1:21 PM

## 2013-10-20 NOTE — Progress Notes (Signed)
Subjective/Complaints:  No problems yesterday. Slept per RN A  review of systems has been performed and if not noted above is otherwise negative. .  Objective: Vital Signs: Blood pressure 135/82, pulse 92, temperature 98.2 F (36.8 C), temperature source Oral, resp. rate 18, height 5' 2.99" (1.6 m), weight 33.5 kg (73 lb 13.7 oz), SpO2 92.00%. No results found. No results found for this basename: WBC, HGB, HCT, PLT,  in the last 72 hours  Recent Labs  10/18/13 0525  NA 135  K 4.6  CL 96  GLUCOSE 84  BUN 8  CREATININE 0.71  CALCIUM 8.5   CBG (last 3)   Recent Labs  10/17/13 2139  GLUCAP 106*    Wt Readings from Last 3 Encounters:  10/19/13 33.5 kg (73 lb 13.7 oz)  09/13/13 46.811 kg (103 lb 3.2 oz)  09/13/13 46.811 kg (103 lb 3.2 oz)    Physical Exam:  Constitutional:      Lethargic to restless, alternating, supine to sit as well as dual leg lifts,  Eyes: Right eye exhibits no discharge. Left eye exhibits no discharge.  Neck:  Janina Mayo out , stoma closed Cardiovascular: Normal rate and regular rhythm.  Respiratory: Effort normal.  GI: Soft. Stoma with scab no drainage minimal tenderness He has no cervical adenopathy.  Neurological:    Awake. Remains distracted. Will make brief eye contact with me but attention is still limited. Musc:  Left arm flexes and extends.   Skin: wounds are healing.   Assessment/Plan: 1. 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 Physiatrist is providing close team supervision and 24 hour management of active medical problems listed below. Physiatrist and rehab team continue to assess barriers to discharge/monitor patient progress toward functional and medical goals.       FIM: FIM - Bathing Bathing Steps Patient Completed: Chest;Right Arm;Left Arm Bathing: 2: Max-Patient completes 3-4 82f 10 parts or 25-49%  FIM -  Upper Body Dressing/Undressing Upper body dressing/undressing steps patient completed: Thread/unthread right sleeve of pullover shirt/dresss;Thread/unthread left sleeve of pullover shirt/dress;Put head through opening of pull over shirt/dress Upper body dressing/undressing: 4: Min-Patient completed 75 plus % of tasks FIM - Lower Body Dressing/Undressing Lower body dressing/undressing steps patient completed: Thread/unthread right underwear leg Lower body dressing/undressing: 1: Two helpers  FIM - Toileting Toileting steps completed by patient: Adjust clothing prior to toileting;Performs perineal hygiene;Adjust clothing after toileting Toileting: 1: Two helpers  FIM - Archivist Transfers: 3-From toilet/BSC: Mod A (lift or lower assist);3-To toilet/BSC: Mod A (lift or lower assist)  FIM - Bed/Chair Transfer Bed/Chair Transfer Assistive Devices: Arm rests Bed/Chair Transfer: 2: Supine > Sit: Max A (lifting assist/Pt. 25-49%);4: Sit > Supine: Min A (steadying pt. > 75%/lift 1 leg);1: Bed > Chair or W/C: Total A (helper does all/Pt. < 25%)  FIM - Locomotion: Wheelchair Distance: 30 Locomotion: Wheelchair: 1: Total Assistance/staff pushes wheelchair (Pt<25%) FIM - Locomotion: Ambulation Locomotion: Ambulation Assistive Devices: Other (comment) (Two person ) Ambulation/Gait Assistance: 1: +2 Total assist Locomotion: Ambulation: 1: Two helpers  Comprehension Comprehension Mode: Auditory Comprehension: 1-Understands basic less than 25% of the time/requires cueing 75% of the time  Expression Expression Mode: Nonverbal Expression: 1-Expresses basis less than 25% of the time/requires cueing greater than 75% of the time.  Social Interaction Social Interaction: 1-Interacts appropriately less than 25% of the time. May be withdrawn or combative.  Problem Solving Problem Solving: 1-Solves basic less than  25% of the time - needs direction nearly all the time or does not effectively  solve problems and may need a restraint for safety  Memory Memory: 1-Recognizes or recalls less than 25% of the time/requires cueing greater than 75% of the time  Medical Problem List and Plan:  1. DVT Prophylaxis/Anticoagulation: Pharmaceutical: Lovenox  2. Pain Management: Prn oxycodone for outward signs of pain.  3. Mood: Currently too low level to evaluate.  4. Neuropsych: This patient is not capable of making decisions on his own behalf.   -reduced amantadine  -some of agitation may be him acting out a little. As a whole he has shown some neurological improvement 5. Recurrent Fever: afebrile    -wounds improving 6. Left tibial plateau Fx--s/p ORIF: PWB in KI (not really practical). ---essentially will be non-weight bearing 7. Left humerus Fx--s/p ORIF: NWB- may be out of splint while in bed  -HO and MO surrounding injury site unchanged 8. Leucocytosis: improving 9. Left shin and heel wound: Continue bilateral PRAFO. Pressure relief measures-- WOC assistance appreciated 10. ABLA: hgb rechecks 11. Dysphagia: Continue TF for supplement. Continue D2, honey liquids with assistance.  12. Agitation: seroquel- decreased to hs only, continue inderal and zoloft,       ativan prn. [place] bed for safety remains indicated  -treat pain 13. FEN: has lost substantial weight. i am hopeful however that he's bottomed out. He's eating well and weight seems to be trying to increase again.  LOS (Days) 37 A FACE TO FACE EVALUATION WAS PERFORMED  SWARTZ,ZACHARY T 10/20/2013 8:58 AM

## 2013-10-20 NOTE — Progress Notes (Signed)
Attempted lower extremity venous duplex. Patient moving and kicking too much to perform test.  Will attempt again later.  Farrel Demark, RDMS, RVT 10/20/2013

## 2013-10-20 NOTE — Plan of Care (Signed)
Problem: RH KNOWLEDGE DEFICIT Goal: RH STG INCREASE KNOWLEDGE OF DYSPHAGIA/FLUID INTAKE Patient will follow safe swallowing techniques with max assistance  Outcome: Not Progressing Impulsive. Requires cues to drink slowly.

## 2013-10-20 NOTE — Progress Notes (Signed)
Physical Therapy Session Note  Patient Details  Name: Perri Lamagna MRN: [id number] Date of Birth: November 27, 1980  Today's Date: 10/20/2013 Time: 1345-1400 (Co-tx with JS (entire session from 1345-1430)) Time Calculation (min): 15 min  Short Term Goals: Week 4:  PT Short Term Goal 1 (Week 4): Patient will follow simple one step commands with functional mobility with modA. PT Short Term Goal 2 (Week 4): Patient will assist with transfers 50% of the time with modA. PT Short Term Goal 3 (Week 4): Patient will demonstrate sustained attention to gait training x1 min with modA.  Skilled Therapeutic Interventions/Progress Updates:    Co-tx with primary OT. Session focused on facilitating purposeful movement, answering yes/no questions. Pt exhibits signs of discomfort in L eye; RN and PA made aware. Gait x30', x45' with +2A (3 musketeers assist) with pt wearing KI on LLE. Pt exhibits excessive LLE internal rotation throughout gait cycle, limited L ankle dorsiflexion during swing phase. Pt consistently follows one-step commands, answers yes/no questions (via head nodding/shaking) with cueing. Per conversation with PA, therapy should hold all ambulation until dopplers completed. Pt did not tolerate having blood drawn while seated in w/c. Therapists departed with pt supine in veil bed in no apparent distress; all needs met.  Therapy Documentation Precautions:  Precautions Precautions: Fall Precaution Comments:  L LE KI on during movement, ok to be off in bed and sitting in w/c Required Braces or Orthoses: Knee Immobilizer - Left Knee Immobilizer - Left: On when out of bed or walking Other Brace/Splint: Bil PRAFOs and L UE and LE splint can be removed for ROM with Ot/PT only.   Restrictions Weight Bearing Restrictions: Yes LUE Weight Bearing: Non weight bearing LLE Weight Bearing: Touchdown weight bearing LLE Partial Weight Bearing Percentage or Pounds: 50 Vital Signs: Therapy Vitals Temp: 98.7 F  (37.1 C) Temp src: Oral Pulse Rate: 88 BP: 115/84 mmHg Patient Position, if appropriate: Lying Oxygen Therapy SpO2: 99 % O2 Device: None (Room air)  See FIM for current functional status  Therapy/Group: Co-Treatment  Deion Forgue A 10/20/2013, 4:56 PM

## 2013-10-20 NOTE — Progress Notes (Signed)
Pt. had large BM requiring shower. Pt. assisted minimal in shower but followed this RN's commands. Wound care to lf. foot due to dressings coming off in shower and Pt. able to follow simple commands while changing dressings. Pt. resting comfortably with condom cath in place throughout night. No facial grimaces of pain noted. Will continue to monitor.

## 2013-10-20 NOTE — Progress Notes (Signed)
Speech Language Pathology Weekly Progress & Session Notes  Patient Details  Name: Cory [name] MRN: [id number] Date of Birth: 28-Jan-1981  Today's Date: 10/20/2013 Time: 1115-1200 Time Calculation (min): 45 min  Short Term Goals: Week 5: SLP Short Term Goal 1 (Week 5): Pt will respond to yes/no questions in regards to biographical information with gestures/verbalizations with 50% of opportunities SLP Short Term Goal 1 - Progress (Week 5): Met SLP Short Term Goal 2 (Week 5): Pt will follow 1 step commands with 75% of opportunities with Mod A multimodal cueing  SLP Short Term Goal 2 - Progress (Week 5): Met SLP Short Term Goal 3 (Week 5): Pt will demonstrate sustained attention during a functioanl task for 3 minutes with Max A verbal cues for redirection  SLP Short Term Goal 3 - Progress (Week 5): Not met SLP Short Term Goal 4 (Week 5): pt will initiate functional tasks with 50% of opportunities with Mod A multimodal cueing  SLP Short Term Goal 4 - Progress (Week 5): Met SLP Short Term Goal 5 (Week 5): Pt will consume Dys. 2 textures with honey-thick liquids with minimal overt s/s of aspiration with Max A verbal cues for utilization of small bites/sips.  SLP Short Term Goal 5 - Progress (Week 5): Met SLP Short Term Goal 6 (Week 5): Pt will consume trials of nectar-thick liquids without overt s/s of aspiration with Max A multimodal cueing SLP Short Term Goal 6 - Progress (Week 5): Met  New Short Term Goals:  Week 6: SLP Short Term Goal 1 (Week 6): Pt will consume trials of thin liquids without overt s/s of aspiration with Max A multimodal cueing SLP Short Term Goal 2 (Week 6): Pt will consume Dys. 2 textures with nectar-thick liquids with minimal overt s/s of aspiration with Max A verbal cues for utilization of small bites/sips.  SLP Short Term Goal 3 (Week 6): pt will initiate functional tasks with 75% of opportunities with Mod A multimodal cueing  SLP Short Term Goal 4 (Week 6): Pt will  demonstrate sustained attention during a functioanl task for 3 minutes with Max A verbal cues for redirection  SLP Short Term Goal 5 (Week 6): Pt will respond to yes/no questions in regards to functional information with gestures/verbalizations with 75% of opportunities SLP Short Term Goal 6 (Week 6): Pt will follow 1 step commands with 90% of opportunities with Mod A multimodal cueing   Weekly Progress Updates: Pt has made functional gains and has met 5 of 6 STG's due to improvements in swallowing function, following 1 step commands, multimodal communication with utilization of head nods and initiation of functional tasks. Currently, pt's behaviors are inconsistent and can range from a Rancho Level IV to emerging V. Pt is tolerating Dys. 2 textures with honey-thick liquids without overt s/s of aspiration and can consume trials of nectar-thick liquids without overt s/s of aspiration. Pt continues to require cueing to decrease impulsivity throughout self-feeding. Pt also requires overall Max-Total A for sustained attention, multimodal communication, functional problem solving, working memory, intellectual awareness, safety awareness and initiation of functional tasks. Pt would benefit from continued skilled SLP intervention to maximize cognitive and swallowing function and functional communication in order to maximize overall functional independence.    SLP Intensity: Minumum of 1-2 x/day, 30 to 90 minutes SLP Frequency: 5 out of 7 days SLP Duration/Estimated Length of Stay: TBD due to SNF placement  SLP Treatment/Interventions: Cueing hierarchy;Cognitive remediation/compensation;Environmental controls;Internal/external aids;Therapeutic Exercise;Speech/Language facilitation;Therapeutic Activities;Patient/family education;Dysphagia/aspiration precaution training;Functional tasks  Daily  Session Skilled Therapeutic Intervention: Treatment focus on dysphagia and cognitive goals. SLP facilitated session by  providing trials of nectar-thick liquids. Pt consumed without overt s/s of aspiration and required Max verbal and tactile cues for utilization of small sips. Pt also consumed lunch meal of Dys. 2 textures without overt s/s of aspiration but required Mod-Max verbal and tactile cues to decrease impulsivity with self-feeding. Pt followed 1 step directions with 75% of opportunities with Mod-Max verbal and visual cues and utilized head nods to answer yes/no questions with 90% of opportunities. Pt also utilized additional hand gestures for communication (waving, hitting table, etc). Pt initiated use of computer that was sitting nearby and independently typed his first name with extra time and encouragement. Today, pt demonstrated behaviors consistent with a Rancho Level V. Continue plan of care.  FIM:  Comprehension Comprehension Mode: Auditory Comprehension: 2-Understands basic 25 - 49% of the time/requires cueing 51 - 75% of the time Expression Expression Mode: Nonverbal Expression: 2-Expresses basic 25 - 49% of the time/requires cueing 50 - 75% of the time. Uses single words/gestures. Social Interaction Social Interaction: 2-Interacts appropriately 25 - 49% of time - Needs frequent redirection. Problem Solving Problem Solving: 1-Solves basic less than 25% of the time - needs direction nearly all the time or does not effectively solve problems and may need a restraint for safety Memory Memory: 1-Recognizes or recalls less than 25% of the time/requires cueing greater than 75% of the time FIM - Eating Eating Activity: 5: Needs verbal cues/supervision Pain No/Denies Pain  Therapy/Group: Individual Therapy  Chenita Ruda 10/20/2013, 3:49 PM

## 2013-10-20 NOTE — Plan of Care (Signed)
Problem: RH BLADDER ELIMINATION Goal: RH STG MANAGE BLADDER WITH ASSISTANCE STG Manage Bladder With total Assistance  Outcome: Progressing Pt incontinent, despite toileting assist.

## 2013-10-20 NOTE — Progress Notes (Signed)
Has been off antibiotics since 10/17/13.  Today with redness at proximal aspect of incision with edema and some edema along calf. Small open area on upper aspect of incision--clean and dry.  Will check CBC and sed rate. Question simmering infection as has hardware present underneath. Will check dopplers to rule out thrombosis.

## 2013-10-20 NOTE — Plan of Care (Signed)
Problem: RH PAIN MANAGEMENT Goal: RH STG PAIN MANAGED AT OR BELOW PT'S PAIN GOAL Patient will verbalize pain level with total assist and medicate as needed  Outcome: Not Progressing Pt not verbalizing discomfort

## 2013-10-20 NOTE — Progress Notes (Signed)
Fannie Knee from lab came to obtain CBC and sed rate. Unsuccessful at 1430. Per Fannie Knee, lab will come back between 1700 and 1700 to re-attempt.

## 2013-10-20 NOTE — Progress Notes (Signed)
Occupational Therapy Note  Patient Details  Name: Cory [name] MRN: [id number] Date of Birth: 03/18/1981 Today's Date: 10/20/2013  Time: 4098-1191 (cotx with PT-total time 1300-1345) Pt exhibited no s/s pain although patient was very fidgety during session Cotreatment with PT  Cotreatment with Pt.  Pt initially performed squat pivot transfer to toilet but patient immediately initiated transfer back to w/c.  Pt initially replied with an affirmative nod of head when questioned if he needed to use toilet but would not remain seated on toilet.  Pt donned Rt shoe when presented and attempted to don Lt shoe.  Pt's left lower leg noted with increased temperature in comparison to Rt and RN notified who notfied PA.  PA ordered no ambulation until dopplers complete.  Pt transitioned to therapy gym using RLE to propel w/c with min A.  Pt participated in throwing bean bags at target.  Pt required max verbal cues to initiate tasks and frequent encouragement to continue participation.  Pt was very restless throughout session and at one point initiate squat pivot transfer to therapy mat to lay down.  Pt required min A for transfers.   Lavone Neri [facility] 10/20/2013, 2:49 PM

## 2013-10-20 NOTE — Progress Notes (Signed)
Physical Therapy Session Note  Patient Details  Name: Cory [name] MRN: [id number] Date of Birth: [birth year]  Today's Date: 10/20/2013 Time: Treatment Session 1: 5621-3086; Treatment Session 2: 1300-1330 Time Calculation (min): Treatment Session 1: 60 min; Treatment Session 2:  Skilled Therapeutic Interventions/Progress Updates:  Treatment Session 1:  1:1. Pt received sitting in w/c, just finished eating breakfast w/ nurse tech in room. Pt provided opportunities for purposeful response and followed 50% of simple commands for assisting w/ donning new gown, combing hair, washing face, donning R shoe and leg brace. In therapy gym, pt demonstrating decreased eye contact and limited sustained attention (< 30sec) to a variety of tasks such as ball catch/toss/kick and basketball. Pt able to amb 15'x3 this session w/ Ax2persons (3 Musketeer style), pt demonstrating improved advancement of R LE w/ heel to toe contact. Pt grossly advancing L LE (brace in place) w/ noted IR, tactile cues for trunk extension. Pt subsequently lying down after each bout of ambulation (BP?) and removing brace. Unable to obtain BP 2/2 tolerance. After each bout of rest, pt assisting w/ donning brace prior to next bout of amb. At end of session, pt grabbing at and attempting to bite his upper L arm, pt would not respond w/ nodding head yes/no if painful. RN aware. Pt seated at nurses station at end of session w/ posey belt in place and staff at station aware.   Treatment Session 2:  Co-tx this session w/ OTA. Pt received sleeping in posey bed, req mod cues to wake and engage with therapists. Pt nodding yes for need to use bathroom, total A x2 persons for toilet t/f and management of clothing, however, pt w/ poor tolerance to sitting on toilet. Pt assiting w/ donning B shoes as well as attempt at tying them. Noted that L LE felt very warm to touch w/ increased redness, RN/PA made aware. PA stated to hold ambulation at this  time until doppler performed. Pt able to propel w/c w/ R LE 75'x2 w/ intermittent min-mod A for propulsion and steering. Focus on sustained attention in therapy gym by tossing bean bags at target w/ pt able to sequentially complete 5 tosses. Pt frequently attempting to lower self to floor, req Ax2 persons to safely sit back in chair. Pt assisted with squat pivot t/f w/c<>tx mat multiple times w/ min A x1 person and resting in supine position. Pt demonstrating improved eye contact this session vs. First tx session. Pt left in care of OT at end of session.   Therapy Documentation Precautions:  Precautions Precautions: Fall Precaution Comments:  L LE KI on during movement, ok to be off in bed and sitting in w/c Required Braces or Orthoses: Knee Immobilizer - Left Knee Immobilizer - Left: On when out of bed or walking Other Brace/Splint: Bil PRAFOs and L UE and LE splint can be removed for ROM with Ot/PT only.   Restrictions Weight Bearing Restrictions: Yes LUE Weight Bearing: Non weight bearing LLE Weight Bearing: Touchdown weight bearing LLE Partial Weight Bearing Percentage or Pounds: 50  See FIM for current functional status  Therapy/Group: Individual Therapy and Co-Treatment  Denzil Hughes 10/20/2013, 12:20 PM

## 2013-10-21 ENCOUNTER — Inpatient Hospital Stay (HOSPITAL_COMMUNITY): Payer: Medicaid Other | Admitting: Occupational Therapy

## 2013-10-21 ENCOUNTER — Inpatient Hospital Stay (HOSPITAL_COMMUNITY): Payer: Medicaid Other | Admitting: *Deleted

## 2013-10-21 ENCOUNTER — Inpatient Hospital Stay (HOSPITAL_COMMUNITY): Payer: Medicaid Other | Admitting: Physical Therapy

## 2013-10-21 NOTE — Progress Notes (Signed)
VASCULAR LAB PRELIMINARY  PRELIMINARY  PRELIMINARY  PRELIMINARY  Left lower extremity venous duplex completed.    Preliminary report:  Left:  No evidence of DVT, superficial thrombosis, or Baker's cyst.  Jola Critzer, RVT 10/21/2013, 10:57 AM

## 2013-10-21 NOTE — Progress Notes (Signed)
Physical Therapy Session Note  Patient Details  Name: Cory [name] MRN: [id number] Date of Birth: 03/07/1981  Today's Date: 10/21/2013 Time: 0900-0930 Time Calculation (min): 30 min  Therapy Documentation Precautions:   (10/20/13 order: No ambulation till duplex back, Range Left Knee 0-90 degrees.)  Precautions: Fall Precaution Comments:  L LE KI on during movement, ok to be off in bed and sitting in w/c Required Braces or Orthoses: Knee Immobilizer - Left Knee Immobilizer - Left: On when out of bed or walking Other Brace/Splint: Bil PRAFOs and L UE and LE splint can be removed for ROM with Ot/PT only.   Restrictions Weight Bearing Restrictions: Yes LUE Weight Bearing: Non weight bearing LLE Weight Bearing: Touchdown weight bearing LLE Partial Weight Bearing Percentage or Pounds: 50 Pain: No appearance of pain and patient shakes head no when questioned if he has pain  Therapeutic Activity:(30') Patient moving B LE's, L UE extensively and very little functional activity from R UE.   Patient participating ~20% of time with ball toss.  Patient able to reposition himself into upright sitting posture in w/c.    Therapy/Group: Individual Therapy  Rex Kras 10/21/2013, 9:32 AM

## 2013-10-21 NOTE — Progress Notes (Signed)
Physical Therapy Note  Patient Details  Name: Cory [name] MRN: [id number] Date of Birth: 1981/01/30 Today's Date: 10/21/2013  1400-1425 (25 minutes) individual Pain: no acute distress noted/ premedicated Focus of treatment: Therapeutic exercise focused on focused attention to task Treatment: Pt very restless at this time (pt had been up in recliner for a couple hours) ; transfers scoot max assist (no attempts at biting noted); Nustep for one minute or less with pt attempting to position LEs on pedals ( required repositioning X 2) ; attempted sitting edge of mat to perform ball toss to facilitate sitting balance but pt too restless (max assist to maintain sitting on mat); returned to room and placed in enclosed bed to rest.   Katie Faraone,JIM 10/21/2013, 2:25 PM

## 2013-10-21 NOTE — Progress Notes (Signed)
Occupational Therapy Session Note  Patient Details  Name: Cory [name] MRN: [id number] Date of Birth: Nov 11, 1980  Today's Date: 10/21/2013 Time: 1300-1400 Time Calculation (min): 60 min  OT Short Term Goals Week 6:  OT Short Term Goal 1 (Week 6): Pt will bathe with mod A at shower level with extra time OT Short Term Goal 2 (Week 6): Pt will perform 2/3 grooming tasks with mod cuing  OT Short Term Goal 3 (Week 6): Pt will demonstrate sustained attention for 3 min with mod A to a functional tasks OT Short Term Goal 4 (Week 6): Pt will consistently answer yes/no questions with 50% accuracy in an hr session   Skilled Therapeutic Interventions/Progress Updates:  Patient received from nursing station in recliner with soft waist belt in place.  Engaged in tabletop activities with focus on sustained attention, participation, follow commands, answer yes/no questions.  Table top activities included, arrange letters to spell patient's name, arrange numbers in order (1-9), write using pen and paper, make and fly a paper airplane, drink water and eat yogurt.  Patient restless and frustrated with soft waist belt through most of the session however was redirectable for short periods (~20-40 seconds at a time) to focus on task.  Patient required max cues for most tasks yet performed best when yes/no questions asked, when spelling his name: "is this the first letter, is this the next letter".  Also when presented with pen and paper, asked patient to write down something he wants me to know.  He placed the pen in his right hand and clearly wrote, "water".  When folding paper airplane, patient assisted with folds once they were started then practiced flying the plane until he reached a target 4 times.  Patient able to sort green, blue and yellow letters and numbers into appropriate piles without cues once helped him get started.  Handed patient off to PT.  Therapy Documentation Precautions:   Precautions Precautions: Fall Precaution Comments:  L LE KI on during movement, ok to be off in bed and sitting in w/c Required Braces or Orthoses: Knee Immobilizer - Left Knee Immobilizer - Left: On when out of bed or walking Other Brace/Splint: Bil PRAFOs and L UE and LE splint can be removed for ROM with Ot/PT only.   Restrictions Weight Bearing Restrictions: Yes LUE Weight Bearing: Non weight bearing LLE Weight Bearing: Touchdown weight bearing LLE Partial Weight Bearing Percentage or Pounds: 50 Pain: No indication of pain  Therapy/Group: Individual Therapy  Enyah Moman 10/21/2013, 5:04 PM
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
## 2013-10-21 NOTE — Progress Notes (Addendum)
Physical Therapy Session Note  Patient Details  Name: Keyvon Herter MRN: [id number] Date of Birth: 1981/04/27  Today's Date: 10/21/2013 Time: 1115-1200 Time Calculation (min): 45 min   Skilled Therapeutic Interventions/Progress Updates:  No evidence of pain, some restless movements. Noted Venous duplex negative.     Tx focused on focused and sustained attention during functional tasks, following one-step commands, transfers, and WC propulsion. Pt responding with yes/no head movements to simple questions today, as in indication of preference of tasks including attempts at: bean bag toss to target, ball toss, WC mobility, eating ice cream, reaching for hands/objects. Pt seemed able to follow 1-step commands at least 50% of the time today when instructed in mobility (scoot back in chair, wait, use foot for propulsion, etc.)   Began with LE stretching/ROM in sitting. Pt allowed therapist to perform knee flexion/ext 0-90 2x10 each side, focused on L side per orders.   Pt able to sustain attention x7-8 seconds per task, including attempts to don show, toss bags, etc. Pt able to sequentially toss 5 bags x3 without cues.  Transfers with Min A WC<>recliner  Pt able to propel WC 3x50' with RLE and LUE with moments of hand-over-hand cues for UE use, but pt able to continue until lost attention again. Mod A for navigating obstacles.   Pt wanting to eat ice cream. Was initially able to follow commands for give/take spoon,  and was then able to hold cup in one hand and self-feed with spoon in other, appropriately handing cup back to therapist when finished.   Pt left up at nurses station with lap belt for lunch.   Therapy Documentation Precautions:  Precautions Precautions: Fall Precaution Comments:  L LE KI on during movement, ok to be off in bed and sitting in w/c Required Braces or Orthoses: Knee Immobilizer - Left Knee Immobilizer - Left: On when out of bed or walking Other Brace/Splint:  Bil PRAFOs and L UE and LE splint can be removed for ROM with Ot/PT only.   Restrictions Weight Bearing Restrictions: Yes LUE Weight Bearing: Non weight bearing LLE Weight Bearing: Touchdown weight bearing LLE Partial Weight Bearing Percentage or Pounds: 50    Pain: Pain Assessment Pain Assessment: No/denies pain    Locomotion : Wheelchair Mobility Distance: 50   See FIM for current functional status  Therapy/Group: Individual Therapy  Clydene Laming, PT, DPT   Eulogio Ditch, Richardson Dopp M 10/21/2013, 12:20 PM

## 2013-10-22 NOTE — Progress Notes (Signed)
Pt was sitting at nursing station in reclining chair with RN asking if he was okay and trying to carry on conversation.  Cory [name] mumbled something under his breath and I asked him to repeat what he said. Pt said, "Bed, water."  I then keep asking him questions in which he answered 50% of them correct.  He says thank you to staff, says what he needs, such as water, ice cream, food, and we reviewed call bell use with him verbalizing understanding and demonstration of use.  Will continue to encourage verbal communication to staff and express his needs.  Pt in vail bed with call bell in reach.
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
## 2013-10-23 ENCOUNTER — Inpatient Hospital Stay (HOSPITAL_COMMUNITY): Payer: Medicaid Other | Admitting: Speech Pathology

## 2013-10-23 ENCOUNTER — Encounter (HOSPITAL_COMMUNITY): Payer: Medicaid - Out of State | Admitting: Occupational Therapy

## 2013-10-23 ENCOUNTER — Inpatient Hospital Stay (HOSPITAL_COMMUNITY): Payer: Medicaid Other | Admitting: Physical Therapy

## 2013-10-23 DIAGNOSIS — IMO0002 Reserved for concepts with insufficient information to code with codable children: Secondary | ICD-10-CM

## 2013-10-23 DIAGNOSIS — S069X9A Unspecified intracranial injury with loss of consciousness of unspecified duration, initial encounter: Secondary | ICD-10-CM

## 2013-10-23 DIAGNOSIS — I69991 Dysphagia following unspecified cerebrovascular disease: Secondary | ICD-10-CM

## 2013-10-23 NOTE — Progress Notes (Signed)
Occupational Therapy Session Note  Patient Details  Name: Cory [name] MRN: [id number] Date of Birth: [birth year]  Today's Date: 10/23/2013 Time: 0830-0930 Time Calculation (min): 60 min  Short Term Goals: Week 6:  OT Short Term Goal 1 (Week 6): Pt will bathe with mod A at shower level with extra time OT Short Term Goal 2 (Week 6): Pt will perform 2/3 grooming tasks with mod cuing  OT Short Term Goal 3 (Week 6): Pt will demonstrate sustained attention for 3 min with mod A to a functional tasks OT Short Term Goal 4 (Week 6): Pt will consistently answer yes/no questions with 50% accuracy in an hr session   Skilled Therapeutic Interventions/Progress Updates:    1:1 self care retraining at shower level. Pt VERBAL TODAY!! Pt appropriate asked where he was and what had happened to him, pt able to answer questions appropriately in short sentences. Performed toilet transfer with attempt to void. Pt sat on the toilet for a 1 min with attempts for a BM. Transitioned into the shower via roll in  shower chair. Pt participated in bathing parts with verbal/ tactile cuing for each part. Pt able to orient shirt to don properly but not pants. Pt able to don shirt with extra time but did require A for threading pants. Pt did demonstrate ideational apraxia with brushing teeth; after putting toothpaste on brush pt brushed shoe. Pt allowed nursing to change bandage to left foot and then appropriately requested to get back into bed to "rest my eyes." Assisted back to bed with min.   Therapy Documentation Precautions:  Precautions Precautions: Fall Precaution Comments:  L LE KI on during movement, ok to be off in bed and sitting in w/c Required Braces or Orthoses: Knee Immobilizer - Left Knee Immobilizer - Left: On when out of bed or walking Other Brace/Splint: Bil PRAFOs and L UE and LE splint can be removed for ROM with Ot/PT only.   Restrictions Weight Bearing Restrictions: Yes LUE Weight Bearing: Touch  down weight bearing LLE Weight Bearing: Touchdown weight bearing LLE Partial Weight Bearing Percentage or Pounds: 50 Pain: Pain Assessment Pain Assessment: PAINAD Pain Type: Acute pain Pain Location: Arm Pain Orientation: Left Pain Descriptors / Indicators: Discomfort Pain Onset: Gradual Patients Stated Pain Goal: 0 Pain Intervention(s): Medication (See eMAR) (ultram 50 mg po) Multiple Pain Sites: No PAINAD (Pain Assessment in Advanced Dementia) Breathing: normal Negative Vocalization: occasional moan/groan, low speech, negative/disapproving quality Facial Expression: smiling or inexpressive Body Language: tense, distressed pacing, fidgeting Consolability: no need to console PAINAD Score: 2  Does report discomfort in left UE/ and right LE  See FIM for current functional status  Therapy/Group: Individual Therapy  [name] [facility] 10/23/2013, 11:46 AM
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
## 2013-10-23 NOTE — Progress Notes (Signed)
Patient verbalizing needs and answering questions appropriately at times . Voice W.N.L. Patient completing sentences at times . Patient noted  at rancho level V . Follows commands and continues to perseverate on simple task at times. RN apologized for pain caused by lovenox injection and patient stated , "Its ok not your fault ". Continue to redirect patient during meals to slow pace of eating and drinking . Continue with plan of care .             Cory [name]

## 2013-10-23 NOTE — Progress Notes (Signed)
Speech Language Pathology Daily Session Note  Patient Details  Name: Cory [name] MRN: [id number] Date of Birth: 1981/04/08  Today's Date: 10/23/2013 Time: 1115-1200 Time Calculation (min): 45 min  Short Term Goals: Week 6: SLP Short Term Goal 1 (Week 6): Pt will consume trials of thin liquids without overt s/s of aspiration with Max A multimodal cueing SLP Short Term Goal 2 (Week 6): Pt will consume Dys. 2 textures with nectar-thick liquids with minimal overt s/s of aspiration with Max A verbal cues for utilization of small bites/sips.  SLP Short Term Goal 3 (Week 6): pt will initiate functional tasks with 75% of opportunities with Mod A multimodal cueing  SLP Short Term Goal 4 (Week 6): Pt will demonstrate sustained attention during a functioanl task for 3 minutes with Max A verbal cues for redirection  SLP Short Term Goal 5 (Week 6): Pt will respond to yes/no questions in regards to functional information with gestures/verbalizations with 75% of opportunities SLP Short Term Goal 6 (Week 6): Pt will follow 1 step commands with 90% of opportunities with Mod A multimodal cueing   Skilled Therapeutic Interventions: Skilled treatment session focused on addressing cognitive and dysphagia goals. SLP facilitated session with trials of water via cup per patient request; patient required Max-Total assist to consume small single cups sips.  Patient mostly consumed trials with consecutive cup sips and demonstrate subtle s/s of aspiration: throat clears, multiple swallows, poor awareness of bolus.  As a result, lunch of Dys.2 textures and nectar-thick liquids was set-up (as done in other sessions).  Patient required Mod cues to consume small portions while self-feeding with intermittent we vocal quality which required Max cues for throat clears to clear.  Patient with spontaneous expression of wants and needs at the single word-phase level; however, patient remains more intelligible at the word level.   Patient required Max assist for focused-sustained attention to self-feeding, with attempts to move away from the table throughout session.  Continue plan of care.   FIM:  Comprehension Comprehension Mode: Auditory Comprehension: 2-Understands basic 25 - 49% of the time/requires cueing 51 - 75% of the time Expression Expression Mode: Verbal Expression: 2-Expresses basic 25 - 49% of the time/requires cueing 50 - 75% of the time. Uses single words/gestures. Social Interaction Social Interaction: 3-Interacts appropriately 50 - 74% of the time - May be physically or verbally inappropriate. Problem Solving Problem Solving: 1-Solves basic less than 25% of the time - needs direction nearly all the time or does not effectively solve problems and may need a restraint for safety Memory Memory: 1-Recognizes or recalls less than 25% of the time/requires cueing greater than 75% of the time FIM - Eating Eating Activity: 4: Helper checks for pocketed food;4: Helper occasionally scoops food on utensil;5: Needs verbal cues/supervision;5: Set-up assist for cut food;5: Set-up assist for open containers  Pain Pain Assessment Pain Assessment: No/denies pain  Therapy/Group: Individual Therapy  Charlane Ferretti., CCC-SLP [phone]  Cory [name] 10/23/2013, 12:51 PM

## 2013-10-23 NOTE — Progress Notes (Signed)
Speech Language Pathology Daily Session Note  Patient Details  Name: Cory [name] MRN: [id number] Date of Birth: July 01, 1981  Today's Date: 10/23/2013 Time: 1250-1330 Time Calculation (min): 40 min  Short Term Goals: Week 6: SLP Short Term Goal 1 (Week 6): Pt will consume trials of thin liquids without overt s/s of aspiration with Max A multimodal cueing SLP Short Term Goal 2 (Week 6): Pt will consume Dys. 2 textures with nectar-thick liquids with minimal overt s/s of aspiration with Max A verbal cues for utilization of small bites/sips.  SLP Short Term Goal 3 (Week 6): pt will initiate functional tasks with 75% of opportunities with Mod A multimodal cueing  SLP Short Term Goal 4 (Week 6): Pt will demonstrate sustained attention during a functioanl task for 3 minutes with Max A verbal cues for redirection  SLP Short Term Goal 5 (Week 6): Pt will respond to yes/no questions in regards to functional information with gestures/verbalizations with 75% of opportunities SLP Short Term Goal 6 (Week 6): Pt will follow 1 step commands with 90% of opportunities with Mod A multimodal cueing   Skilled Therapeutic Interventions: Skilled treatment session focused on addressing cognitive-linguistic deficits. SLP provided 2 choices to patient in an attempt to encourage him to express basic wants and needs; patient appropriately responded in 30% of opportunities.  Verbal expression is often characterized by repetition of what therapist stated.  He demonstrates more accurate responses with spontaneous requests for needs and wants, as demonstrated by his request to go back to bed.  Patient declined participating in any structured task; however, followed directions during wheel chair set up and transfer with Max assist cues.    FIM:  Comprehension Comprehension Mode: Auditory Comprehension: 2-Understands basic 25 - 49% of the time/requires cueing 51 - 75% of the time Expression Expression Mode:  Verbal Expression: 2-Expresses basic 25 - 49% of the time/requires cueing 50 - 75% of the time. Uses single words/gestures. Social Interaction Social Interaction: 3-Interacts appropriately 50 - 74% of the time - May be physically or verbally inappropriate. Problem Solving Problem Solving: 1-Solves basic less than 25% of the time - needs direction nearly all the time or does not effectively solve problems and may need a restraint for safety Memory Memory: 1-Recognizes or recalls less than 25% of the time/requires cueing greater than 75% of the time FIM - Eating Eating Activity: 4: Helper checks for pocketed food;4: Helper occasionally scoops food on utensil;5: Needs verbal cues/supervision;5: Set-up assist for cut food;5: Set-up assist for open containers  Pain Pain Assessment Pain Assessment: No/denies pain  Therapy/Group: Individual Therapy  Charlane Ferretti., CCC-SLP [phone]  Astrid Vides 10/23/2013, 3:01 PM

## 2013-10-23 NOTE — Progress Notes (Signed)
Subjective/Complaints:  No new issues. Resting comfortably. More interactive with staff yesterday A  review of systems has been performed and if not noted above is otherwise negative. .  Objective: Vital Signs: Blood pressure 126/80, pulse 72, temperature 97.4 F (36.3 C), temperature source Axillary, resp. rate 18, height 5' 2.99" (1.6 m), weight 32.1 kg (70 lb 12.3 oz), SpO2 93.00%. No results found.  Recent Labs  10/20/13 1725  WBC 8.8  HGB 10.9*  HCT 33.1*  PLT 169   No results found for this basename: NA, K, CL, CO, GLUCOSE, BUN, CREATININE, CALCIUM,  in the last 72 hours CBG (last 3)  No results found for this basename: GLUCAP,  in the last 72 hours  Wt Readings from Last 3 Encounters:  10/22/13 32.1 kg (70 lb 12.3 oz)  09/13/13 46.811 kg (103 lb 3.2 oz)  09/13/13 46.811 kg (103 lb 3.2 oz)    Physical Exam:  Constitutional:      Lethargic to restless, alternating, supine to sit as well as dual leg lifts,  Eyes: Right eye exhibits no discharge. Left eye exhibits no discharge.  Neck:  Janina Mayo out , stoma closed Cardiovascular: Normal rate and regular rhythm.  Respiratory: Effort normal.  GI: Soft. Stoma with scab no drainage minimal tenderness He has no cervical adenopathy.  Neurological:    Awake. Remains distracted. Will make brief eye contact with me but attention is still limited. Musc:  Left arm flexes and extends.   Skin: wounds are healing.   Assessment/Plan: 1. 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 Physiatrist is providing close team supervision and 24 hour management of active medical problems listed below. Physiatrist and rehab team continue to assess barriers to discharge/monitor patient progress toward functional and medical goals.       FIM: FIM - Bathing Bathing Steps Patient Completed: Chest;Right Arm;Left Arm Bathing: 2:  Max-Patient completes 3-4 23f 10 parts or 25-49%  FIM - Upper Body Dressing/Undressing Upper body dressing/undressing steps patient completed: Thread/unthread right sleeve of pullover shirt/dresss;Thread/unthread left sleeve of pullover shirt/dress;Put head through opening of pull over shirt/dress Upper body dressing/undressing: 4: Min-Patient completed 75 plus % of tasks FIM - Lower Body Dressing/Undressing Lower body dressing/undressing steps patient completed: Thread/unthread right underwear leg Lower body dressing/undressing: 1: Two helpers  FIM - Toileting Toileting steps completed by patient: Adjust clothing prior to toileting;Performs perineal hygiene;Adjust clothing after toileting Toileting: 1: Two helpers  FIM - Archivist Transfers: 3-From toilet/BSC: Mod A (lift or lower assist);3-To toilet/BSC: Mod A (lift or lower assist)  FIM - Press photographer Assistive Devices: Arm rests Bed/Chair Transfer: 4: Bed > Chair or W/C: Min A (steadying Pt. > 75%);3: Chair or W/C > Bed: Mod A (lift or lower assist)  FIM - Locomotion: Wheelchair Distance: 50 Locomotion: Wheelchair: 2: Travels 50 - 149 ft with moderate assistance (Pt: 50 - 74%) FIM - Locomotion: Ambulation Locomotion: Ambulation Assistive Devices: Other (comment) (Two person ) Ambulation/Gait Assistance: 1: +2 Total assist Locomotion: Ambulation: 0: Activity did not occur  Comprehension Comprehension Mode: Auditory Comprehension: 2-Understands basic 25 - 49% of the time/requires cueing 51 - 75% of the time  Expression Expression Mode: Verbal;Nonverbal Expression: 2-Expresses basic 25 - 49% of the time/requires cueing 50 - 75% of the time. Uses single words/gestures.  Social Interaction Social Interaction: 3-Interacts appropriately 50 - 74% of the time - May be physically or verbally inappropriate.  Problem Solving Problem Solving: 1-Solves basic less than 25% of the time - needs  direction nearly all the time or does not effectively solve problems and may need a restraint for safety  Memory Memory: 1-Recognizes or recalls less than 25% of the time/requires cueing greater than 75% of the time  Medical Problem List and Plan:  1. DVT Prophylaxis/Anticoagulation: Pharmaceutical: Lovenox  2. Pain Management: Prn oxycodone for outward signs of pain.  3. Mood: Currently too low level to evaluate.  4. Neuropsych: This patient is not capable of making decisions on his own behalf.   -reduced amantadine  -some of agitation may be him acting out a little. As a whole he has shown some neurological improvement 5. Recurrent Fever: afebrile    -wounds improving 6. Left tibial plateau Fx--s/p ORIF: PWB in KI (not really practical). ---essentially will be non-weight bearing 7. Left humerus Fx--s/p ORIF: NWB- may be out of splint while in bed  -HO and MO surrounding injury site unchanged 8. Leucocytosis: improving 9. Left shin and heel wound: Continue bilateral PRAFO.  Marland Kitchen Keflex initiated for coverage without obvious change 10. ABLA: hgb rechecks 11. Dysphagia: Continue TF for supplement. Continue D2, honey liquids with assistance.  12. Agitation: seroquel- decreased to hs only, continue inderal and zoloft,       ativan prn. [place] bed for safety remains indicated  -treat pain 13. FEN: has lost substantial weight. i am hopeful however that he's bottomed out. He's eating well and weight should improve with time. LOS (Days) 40 A FACE TO FACE EVALUATION WAS PERFORMED  Jahree Dermody T 10/23/2013 7:47 AM

## 2013-10-23 NOTE — Progress Notes (Signed)
Physical Therapy Session Note  Patient Details  Name: Cory [name] MRN: [id number] Date of Birth: 1981/08/29  Today's Date: 10/23/2013 Time: 4098-1191 Time Calculation (min): 57 min  Skilled Therapeutic Interventions/Progress Updates:    Pt received in lying in bed. Pt alert and verbalizing single word responses to therapist questions. Performed supine>sit and stand pivot transfer bed>w/c min A. Pt noted to be incontinent of urine. Doffed pants and brief at sink sit>stand level 2 person assist secondary to pt's impulsive movement and weightbearing precautions. Pt able to assist with doffing pants and hygiene with mod verbal cues. Attempted to engage pt in standing to place soiled clothes in washing machine, pt refused standing. Propelled pt in w/c through dayroom and hall on unit, pt able to correctly answer questions about environment when given two choices (ex "Is it rainy or sunny outside?"). Pt requested water, provided pt with honey-thick juice. Max verbal cues for small sips. In therapy gym, donned knee immobilizer and pt ambulated 10 ft +2 assist three musketeers style. Pt requested more juice, drank sitting edge of mat supervision. Pt lay down on mat, verbalizing with single word answers to question cues that he wanted to stay in gym on mat to sleep. With encouragement and max verbal cues, pt agreed to return to room. Pt ambulated 10 ft to w/c +2 assist. Returned pt room, pt transferred w/c>bed and sit>supine min A. Pt demonstrated improved following of simple commands this session. Pt left in bed with enclosure bed secured.   Therapy Documentation Precautions:  Precautions Precautions: Fall Precaution Comments:  L LE KI on during movement, ok to be off in bed and sitting in w/c Required Braces or Orthoses: Knee Immobilizer - Left Knee Immobilizer - Left: On when out of bed or walking Other Brace/Splint: Bil PRAFOs and L UE and LE splint can be removed for ROM with Ot/PT only.    Restrictions Weight Bearing Restrictions: Yes LUE Weight Bearing: Touch down weight bearing LLE Weight Bearing: Touchdown weight bearing LLE Partial Weight Bearing Percentage or Pounds: 50   Pain: Pt stated stated his legs and hand hurt, unable to specify number on pain scale. Offered to ask nurse for pain medication, pt refused.  See FIM for current functional status  Therapy/Group: Individual Therapy  Gabbrielle Mcnicholas 10/23/2013, 3:06 PM

## 2013-10-23 NOTE — Progress Notes (Signed)
I have reviewed and I agree with the following treatment note.  Trysten Berti Hall, PT, DPT  

## 2013-10-24 ENCOUNTER — Inpatient Hospital Stay (HOSPITAL_COMMUNITY): Payer: Medicaid Other | Admitting: Speech Pathology

## 2013-10-24 ENCOUNTER — Encounter (HOSPITAL_COMMUNITY): Payer: Medicaid - Out of State | Admitting: Occupational Therapy

## 2013-10-24 ENCOUNTER — Inpatient Hospital Stay (HOSPITAL_COMMUNITY): Payer: Medicaid Other | Admitting: *Deleted

## 2013-10-24 ENCOUNTER — Ambulatory Visit (HOSPITAL_COMMUNITY): Payer: Medicaid - Out of State | Admitting: Occupational Therapy

## 2013-10-24 DIAGNOSIS — S069X9A Unspecified intracranial injury with loss of consciousness of unspecified duration, initial encounter: Secondary | ICD-10-CM

## 2013-10-24 DIAGNOSIS — R609 Edema, unspecified: Secondary | ICD-10-CM

## 2013-10-24 DIAGNOSIS — I69991 Dysphagia following unspecified cerebrovascular disease: Secondary | ICD-10-CM

## 2013-10-24 DIAGNOSIS — IMO0002 Reserved for concepts with insufficient information to code with codable children: Secondary | ICD-10-CM

## 2013-10-24 NOTE — Progress Notes (Signed)
Speech Language Pathology Daily Session Note  Patient Details  Name: Cory [name] MRN: [id number] Date of Birth: 1981-06-22  Today's Date: 10/24/2013 Session 1 Time: 4098-1191 Time Calculation (min): 40 min Session 2 Time: 4782-9562 Time Calculation (min): 40 min   Short Term Goals: Week 6: SLP Short Term Goal 1 (Week 6): Pt will consume trials of thin liquids without overt s/s of aspiration with Max A multimodal cueing SLP Short Term Goal 2 (Week 6): Pt will consume Dys. 2 textures with nectar-thick liquids with minimal overt s/s of aspiration with Max A verbal cues for utilization of small bites/sips.  SLP Short Term Goal 3 (Week 6): pt will initiate functional tasks with 75% of opportunities with Mod A multimodal cueing  SLP Short Term Goal 4 (Week 6): Pt will demonstrate sustained attention during a functioanl task for 3 minutes with Max A verbal cues for redirection  SLP Short Term Goal 5 (Week 6): Pt will respond to yes/no questions in regards to functional information with gestures/verbalizations with 75% of opportunities SLP Short Term Goal 6 (Week 6): Pt will follow 1 step commands with 90% of opportunities with Mod A multimodal cueing   Skilled Therapeutic Interventions: Session 1 Skilled treatment session focused on addressing cognitive and dysphagia goals. Mom and mom's cousin present to observe today's session.  SLP facilitated session with set-up of lunch tray and Max verbal cues to utilize safe swallow strategies.  Patient consumed Dys.2 textures and nectar-thick liquids with improved pacing and portion control as compared to yesterday's session. Wet vocal quality audible at end of session; however, Max cues to clear were unsuccessful.  Patient in [place] Chair for today's session which appeared to positively impact session and focus on task; patient made no attempts to move away from meal/table today.  Patient with spontaneous expression of wants and needs with request to go  to bed at end of session and as a result, session ended 5 minutes early.  Recommend to upgrade diet to nectar-thick liquids with continuation of all other orders.      Session 2 Skilled treatment session focused on addressing cognition.  Patient in bed resting, he easily aroused to verbal stimuli and was agreeable to getting up.  SLP attempted toileting with patient suspecting incontinence following nap; however, patient refused.  Patient with Total assist to attend to and complete functional tasks this afternoon, appeared more restless in [facility] Chair as compared to am session and attempted to decline all structured therapy tasks.  Patient with vague verbal expression this afternoon "come on, let's go," and required Max question cues to verbally express needs such as water. Continue with current plan of care.    FIM:  Comprehension Comprehension Mode: Auditory Comprehension: 2-Understands basic 25 - 49% of the time/requires cueing 51 - 75% of the time Expression Expression Mode: Verbal Expression: 2-Expresses basic 25 - 49% of the time/requires cueing 50 - 75% of the time. Uses single words/gestures. Social Interaction Social Interaction: 3-Interacts appropriately 50 - 74% of the time - May be physically or verbally inappropriate. Problem Solving Problem Solving: 1-Solves basic less than 25% of the time - needs direction nearly all the time or does not effectively solve problems and may need a restraint for safety Memory Memory: 1-Recognizes or recalls less than 25% of the time/requires cueing greater than 75% of the time FIM - Eating Eating Activity: 4: Helper checks for pocketed food;4: Helper occasionally scoops food on utensil;5: Needs verbal cues/supervision;5: Set-up assist for cut food;5: Set-up assist  for open containers  Pain Session 1 Pain Assessment Pain Assessment: No/denies pain Session 2 Patient restless reporting that he needed his nurse, inconsistent reports of pain without  location given, RN made aware  Therapy/Group: Individual Therapy  Charlane Ferretti., CCC-SLP [phone]  Monque Haggar 10/24/2013, 5:05 PM

## 2013-10-24 NOTE — Progress Notes (Signed)
Physical Therapy Weekly Progress Note  Patient Details  Name: Cory [name] MRN: [id number] Date of Birth: 1981-03-16  Today's Date: 10/24/2013 Time: 0930-1020 Time Calculation (min): 50 min  Patient has met 2 of 3 short term goals.  Patient has made significant gains this reporting period due to improvements in visual tracking, sustained attention, command following, and more recently patient VERBAL!! Currently, patient is demonstrating behaviors consistent of a [place]o Level IV, emerging Level V. Patient demonstrates increased ability and consistency to sustain attention to functional tasks, following simple/one step commands, verbalizing wants/needs, perform functional transfers with less assistance. Patient still requires total A +2 for gait training, prolonged static standing, and stair negotiation secondary to impulsive movements and L LE weightbearing precautions.  Patient continues to demonstrate the following deficits: muscle weakness, decreased cardiorespiratoy endurance, impaired timing and sequencing, abnormal tone, unbalanced muscle activation, motor apraxia, decreased motor planning, decreased visual perceptual skills and decreased visual motor skills, decreased midline orientation, decreased attention to right, decreased initiation, decreased problem solving, decreased safety awareness, decreased memory and delayed processing and decreased sitting balance, decreased standing balance, hemiplegia, decreased balance strategies and therefore will continue to benefit from skilled PT intervention to enhance overall performance with activity tolerance, balance, postural control, ability to compensate for deficits, functional use of  right upper extremity, left upper extremity and left lower extremity, attention, awareness, coordination and knowledge of precautions.  Patient progressing toward long term goals..  Continue plan of care.  PT Short Term Goals Week 1:  PT Short Term Goal 1 (Week  1): Pt will participate 10% with functional transfer PT Short Term Goal 1 - Progress (Week 1): Not met PT Short Term Goal 2 (Week 1): Pt will demo focused attention to functional activity  x 10 seconds with max A PT Short Term Goal 2 - Progress (Week 1): Not met PT Short Term Goal 3 (Week 1): Pt will maintain static seated balance for therapeutic activity x 30 seconds with pt assisting 25% PT Short Term Goal 3 - Progress (Week 1): Not met Week 2:  PT Short Term Goal 1 (Week 2): Patient will demonstrate a localized response in right field with audiotory stimulus 75% of the time with max cuing PT Short Term Goal 1 - Progress (Week 2): Met PT Short Term Goal 2 (Week 2): Pt will demonstrate focused attention for 3 seconds with max cuing for in preparation for bed<>wheelchair transfer. PT Short Term Goal 2 - Progress (Week 2): Met PT Short Term Goal 3 (Week 2): Pt will visually track a functional object 50% of opportunities with max cuing PT Short Term Goal 3 - Progress (Week 2): Met Week 3:  PT Short Term Goal 1 (Week 3): Patient will demonstrate sustained attention x 20" in a minimally distractive environment. PT Short Term Goal 1 - Progress (Week 3): Met PT Short Term Goal 2 (Week 3): Patient will demonstrate functional object use while sitting edge of bed on 2 of 3 occasions. PT Short Term Goal 2 - Progress (Week 3): Met PT Short Term Goal 3 (Week 3): Patient will follow one step commands 3/3 trials while sitting edge of bed. PT Short Term Goal 3 - Progress (Week 3): Met Week 4:  PT Short Term Goal 1 (Week 4): Patient will follow simple one step commands with functional mobility with modA. PT Short Term Goal 1 - Progress (Week 4): Met PT Short Term Goal 2 (Week 4): Patient will assist with transfers 50% of the time with modA. PT  Short Term Goal 2 - Progress (Week 4): Met PT Short Term Goal 3 (Week 4): Patient will demonstrate sustained attention to gait training x1 min with modA. PT Short Term  Goal 3 - Progress (Week 4): Progressing toward goal Week 5:  PT Short Term Goal 1 (Week 5): Patient will follow one step commands with transfers with min verbal cues and minA. PT Short Term Goal 2 (Week 5): Patient will assist with transfers 75% of the time with minA. PT Short Term Goal 3 (Week 5): Patient will demonstrate sustained attention for entireity of each trial of gait training x20'. PT Short Term Goal 4 (Week 5): Patient will demonstrate sustained attention to therapeutic activity x30" with mod verbal cues.  Skilled Therapeutic Interventions/Progress Updates:    Patient received sitting in [place], from OT. Inconsistent c/o pain with L LE. Session focused on functional transfers, sit<>stand transfers, and active participation in therapeutic activities. Patient presents very tired and unengaged today, maintaining eyes closed and head turned away for most of beginning of session. Patient oriented to person and place, disoriented to time and situation. Patient able to state he "prefers Ty" rather than full name. Attempted to engage patient in color-naming task, requires max encouragement and cues for participation. Patient providing random answers to questions, appearing to want activity to end. Patient very motivated by drinks to participate. Patient performed transfers GeriChair<>mat, sit<>supine, and sit<>stand several times in exchange for juice. Patient allows communication about interests, family, preferences, etc. Patient more alert and engaging towards second half of session. Patient left seated in [place] at [employer] station, RN aware.  Therapy Documentation Precautions:  Precautions Precautions: Fall Precaution Comments:  L LE KI on during movement, ok to be off in bed and sitting in w/c Required Braces or Orthoses: Knee Immobilizer - Left Knee Immobilizer - Left: On when out of bed or walking Restrictions Weight Bearing Restrictions: Yes LUE Weight Bearing: Touch down weight  bearing LLE Weight Bearing: Touchdown weight bearing LLE Partial Weight Bearing Percentage or Pounds: 50 Locomotion : Ambulation Ambulation/Gait Assistance: Not tested (comment)   See FIM for current functional status  Therapy/Group: Individual Therapy  Chipper Herb. Neko Boyajian, PT, DPT 10/24/2013, 10:23 AM

## 2013-10-24 NOTE — Progress Notes (Signed)
Occupational Therapy Session Note  Patient Details  Name: Cory [name] MRN: [id number] Date of Birth: 01-Jul-1981  Today's Date: 10/24/2013 Time: 1330-1415 Time Calculation (min): 45 min  Short Term Goals: Week 6:  OT Short Term Goal 1 (Week 6): Pt will bathe with mod A at shower level with extra time OT Short Term Goal 2 (Week 6): Pt will perform 2/3 grooming tasks with mod cuing  OT Short Term Goal 3 (Week 6): Pt will demonstrate sustained attention for 3 min with mod A to a functional tasks OT Short Term Goal 4 (Week 6): Pt will consistently answer yes/no questions with 50% accuracy in an hr session   Skilled Therapeutic Interventions/Progress Updates:    Co treat with Rec therapy with focus on cognitive remediation with focus on orientation information, following one step directions to sort from a field of 2 (colored cards), sustained attention long enough to complete a functional task, attempted timed toileting to void, allowing changing of soiled brief in bathroom with toilet transfer, counting and sorting items (finding 10 red pegs from box of pegs of 4 different colors). Pt is demonstrating a behavioral component of wanting to go back to bed and not trying activity.   Therapy Documentation Precautions:  Precautions Precautions: Fall Precaution Comments:  L LE KI on during movement, ok to be off in bed and sitting in w/c Required Braces or Orthoses: Knee Immobilizer - Left Knee Immobilizer - Left: On when out of bed or walking Other Brace/Splint: Bil PRAFOs and L UE and LE splint can be removed for ROM with Ot/PT only.   Restrictions Weight Bearing Restrictions: Yes LUE Weight Bearing: Touch down weight bearing LLE Weight Bearing: Touchdown weight bearing LLE Partial Weight Bearing Percentage or Pounds: 50 Pain:  c/o HA - RN aware and gave scheduled meds See FIM for current functional status  Therapy/Group: Individual Therapy  [name] [facility] 10/24/2013, 3:47  PM

## 2013-10-24 NOTE — Progress Notes (Signed)
Occupational Therapy Session Note  Patient Details  Name: Cory [name] MRN: [id number] Date of Birth: 06-13-1981  Today's Date: 10/24/2013 Time: 0830-0930 Time Calculation (min): 60 min  Short Term Goals: Week 6:  OT Short Term Goal 1 (Week 6): Pt will bathe with mod A at shower level with extra time OT Short Term Goal 2 (Week 6): Pt will perform 2/3 grooming tasks with mod cuing  OT Short Term Goal 3 (Week 6): Pt will demonstrate sustained attention for 3 min with mod A to a functional tasks OT Short Term Goal 4 (Week 6): Pt will consistently answer yes/no questions with 50% accuracy in an hr session   Skilled Therapeutic Interventions/Progress Updates:    1:1 self care retraining at shower level with roll in shower chair. Focus on orientation, simple problem solving, following one step directions, sit to stands, functional use of bilateral UEs, proper use of grooming tools, functional verbal communication of wants and needs, etc. Pt with increased fatigue by end of session.  Pt oriented to self, and place but not accident. Pt did report he was here "to do therapy."  Therapy Documentation Precautions:  Precautions Precautions: Fall Precaution Comments:  L LE KI on during movement, ok to be off in bed and sitting in w/c Required Braces or Orthoses: Knee Immobilizer - Left Knee Immobilizer - Left: On when out of bed or walking Other Brace/Splint: Bil PRAFOs and L UE and LE splint can be removed for ROM with Ot/PT only.   Restrictions Weight Bearing Restrictions: Yes LUE Weight Bearing: Touch down weight bearing LLE Weight Bearing: Touchdown weight bearing LLE Partial Weight Bearing Percentage or Pounds: 50 Pain:  soreness in left LE - RN aware and gave meds prior to session   See FIM for current functional status  Therapy/Group: Individual Therapy  [name] [facility] 10/24/2013, 10:25 AM
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
## 2013-10-24 NOTE — Plan of Care (Signed)
Problem: RH Balance Goal: LTG Patient will maintain dynamic sitting balance (PT) LTG: Patient will maintain dynamic sitting balance with assistance during mobility activities (PT)  upgraded 10/24/13  Problem: RH Bed Mobility Goal: LTG Patient will perform bed mobility with assist (PT) LTG: Patient will perform bed mobility with assistance, with/without cues (PT).  upgraded 10/24/13  Problem: RH Bed to Chair Transfers Goal: LTG Patient will perform bed/chair transfers w/assist (PT) LTG: Patient will perform bed/chair transfers with assistance, with/without cues (PT).  upgraded 10/24/13  Problem: RH Awareness Goal: LTG: Patient will demonstrate intellectual/emergent (PT) LTG: Patient will demonstrate intellectual/emergent/anticipatory awareness with assist during a mobility activity (PT)  upgraded 10/24/13

## 2013-10-25 ENCOUNTER — Inpatient Hospital Stay (HOSPITAL_COMMUNITY): Payer: Medicaid Other | Admitting: *Deleted

## 2013-10-25 ENCOUNTER — Inpatient Hospital Stay (HOSPITAL_COMMUNITY): Payer: Medicaid Other | Admitting: Speech Pathology

## 2013-10-25 ENCOUNTER — Encounter (HOSPITAL_COMMUNITY): Payer: Medicaid - Out of State | Admitting: Occupational Therapy

## 2013-10-25 DIAGNOSIS — I69991 Dysphagia following unspecified cerebrovascular disease: Secondary | ICD-10-CM

## 2013-10-25 DIAGNOSIS — S069X9A Unspecified intracranial injury with loss of consciousness of unspecified duration, initial encounter: Secondary | ICD-10-CM

## 2013-10-25 DIAGNOSIS — IMO0002 Reserved for concepts with insufficient information to code with codable children: Secondary | ICD-10-CM

## 2013-10-25 NOTE — Progress Notes (Signed)
Subjective/Complaints:  Speaking but with poor orientation A  review of systems has been performed and if not noted above is otherwise negative. .  Objective: Vital Signs: Blood pressure 106/66, pulse 68, temperature 97.7 F (36.5 C), temperature source Axillary, resp. rate 18, height 5' 2.99" (1.6 m), weight 31.6 kg (69 lb 10.7 oz), SpO2 94.00%. No results found. No results found for this basename: WBC, HGB, HCT, PLT,  in the last 72 hours No results found for this basename: NA, K, CL, CO, GLUCOSE, BUN, CREATININE, CALCIUM,  in the last 72 hours CBG (last 3)  No results found for this basename: GLUCAP,  in the last 72 hours  Wt Readings from Last 3 Encounters:  10/24/13 31.6 kg (69 lb 10.7 oz)  09/13/13 46.811 kg (103 lb 3.2 oz)  09/13/13 46.811 kg (103 lb 3.2 oz)    Physical Exam:  Constitutional:      Lethargic to restless, alternating, supine to sit as well as dual leg lifts,  Eyes: Right eye exhibits no discharge. Left eye exhibits no discharge.  Neck:  Janina Mayo out , stoma closed Cardiovascular: Normal rate and regular rhythm.  Respiratory: Effort normal.  GI: Soft. Stoma with scab no drainage He has no cervical adenopathy.  Neurological:    Awake. Remains distracted. Makes eye contacts and does speak/mumble words at times. Musc:  Left arm flexes and extends.   Skin: wounds are healing. Left shin slightly reddened due to scar, minimal to no drainage seen from small wound   Assessment/Plan: 1. 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 Physiatrist is providing close team supervision and 24 hour management of active medical problems listed below. Physiatrist and rehab team continue to assess barriers to discharge/monitor patient progress toward functional and medical goals.       FIM: FIM - Bathing Bathing Steps Patient Completed: Chest;Abdomen;Right  upper leg;Left upper leg;Right Arm Bathing: 3: Mod-Patient completes 5-7 76f 10 parts or 50-74%  FIM - Upper Body Dressing/Undressing Upper body dressing/undressing steps patient completed: Thread/unthread right sleeve of pullover shirt/dresss;Thread/unthread left sleeve of pullover shirt/dress;Put head through opening of pull over shirt/dress;Pull shirt over trunk Upper body dressing/undressing: 4: Steadying assist FIM - Lower Body Dressing/Undressing Lower body dressing/undressing steps patient completed: Thread/unthread right pants leg;Pull pants up/down;Don/Doff left sock;Don/Doff left shoe Lower body dressing/undressing: 3: Mod-Patient completed 50-74% of tasks  FIM - Toileting Toileting steps completed by patient: Adjust clothing prior to toileting Toileting Assistive Devices: Grab bar or rail for support Toileting: 1: Two helpers  FIM - Diplomatic Services operational officer Devices: Therapist, music Transfers: 3-To toilet/BSC: Mod A (lift or lower assist);3-From toilet/BSC: Mod A (lift or lower assist)  FIM - Bed/Chair Transfer Bed/Chair Transfer Assistive Devices: Arm rests Bed/Chair Transfer: 3: Chair or W/C > Bed: Mod A (lift or lower assist)  FIM - Locomotion: Wheelchair Distance: 50 Locomotion: Wheelchair: 0: Activity did not occur FIM - Locomotion: Ambulation Locomotion: Ambulation Assistive Devices:  (2 person "three musketeers" assist) Ambulation/Gait Assistance: Not tested (comment) Locomotion: Ambulation: 0: Activity did not occur  Comprehension Comprehension Mode: Auditory Comprehension: 2-Understands basic 25 - 49% of the time/requires cueing 51 - 75% of the time  Expression Expression Mode: Verbal Expression: 2-Expresses basic 25 - 49% of the time/requires cueing 50 - 75% of the time. Uses single words/gestures.  Social Interaction Social Interaction: 3-Interacts appropriately 50 - 74% of the time - May be physically or verbally  inappropriate.  Problem Solving Problem Solving: 1-Solves basic less than 25% of the time - needs direction nearly all the time or does not effectively solve problems and may need a restraint for safety  Memory Memory: 1-Recognizes or recalls less than 25% of the time/requires cueing greater than 75% of the time  Medical Problem List and Plan:  1. DVT Prophylaxis/Anticoagulation: Pharmaceutical: Lovenox  2. Pain Management: Prn oxycodone for outward signs of pain.  3. Mood: Currently too low level to evaluate.  4. Neuropsych: This patient is not capable of making decisions on his own behalf.   -reduced amantadine  -some of agitation may be him acting out a little. As a whole he has shown some neurological improvement 5. Recurrent Fever: afebrile    -wounds improving 6. Left tibial plateau Fx--s/p ORIF: PWB in KI (not really practical). ---essentially will be non-weight bearing 7. Left humerus Fx--s/p ORIF: NWB- may be out of splint while in bed  -HO and MO surrounding injury site unchanged 8. Leucocytosis: improving 9. Left shin and heel wound: Continue bilateral PRAFO.  Marland Kitchen Keflex initiated for coverage without obvious change 10. ABLA: hgb rechecks 11. Dysphagia: Continue TF for supplement. Continue D2, honey liquids with assistance.  12. Agitation: seroquel- decreased to hs only, continue inderal and zoloft     -ativan prn. [place] bed for safety remains indicated  -treating pain 13. FEN: has lost substantial weight from initial admi (i question validity of weight)  LOS (Days) 42 A FACE TO FACE EVALUATION WAS PERFORMED  KIRSTEINS,ANDREW E 10/25/2013 11:00 AM

## 2013-10-25 NOTE — Progress Notes (Signed)
Speech Language Pathology Daily Session Note  Patient Details  Name: Cory [name] MRN: [id number] Date of Birth: 22-Mar-1981  Today's Date: 10/25/2013 Time: 1115-1200 Time Calculation (min): 45 min  Short Term Goals: Week 6: SLP Short Term Goal 1 (Week 6): Pt will consume trials of thin liquids without overt s/s of aspiration with Max A multimodal cueing SLP Short Term Goal 2 (Week 6): Pt will consume Dys. 2 textures with nectar-thick liquids with minimal overt s/s of aspiration with Max A verbal cues for utilization of small bites/sips.  SLP Short Term Goal 3 (Week 6): pt will initiate functional tasks with 75% of opportunities with Mod A multimodal cueing  SLP Short Term Goal 4 (Week 6): Pt will demonstrate sustained attention during a functioanl task for 3 minutes with Max A verbal cues for redirection  SLP Short Term Goal 5 (Week 6): Pt will respond to yes/no questions in regards to functional information with gestures/verbalizations with 75% of opportunities SLP Short Term Goal 6 (Week 6): Pt will follow 1 step commands with 90% of opportunities with Mod A multimodal cueing   Skilled Therapeutic Interventions: Skilled treatment session focused on addressing cognitive and dysphagia goals. SLP facilitated session with set-up of lunch tray and Max verbal cues to utilize safe swallow strategies.  Patient consumed Dys.2 textures and nectar-thick liquids with an intermittent wet vocal quality audible which required Max multimodal cues to clear.  Patient in [place] Chair for today's session which appeared to positively impact session and focus on task when meal was placed on the tray attached to his chair. Patient with spontaneous verbal expression of wants and needs with language of confusion and decreased intelligibility at times.  Pt also participated in sorting task from field of two and required total A to complete with 0% accuracy. Pt placed in enclosure bed to rest at end of session.  Continue  plan of care.    FIM:  Comprehension Comprehension Mode: Auditory Comprehension: 2-Understands basic 25 - 49% of the time/requires cueing 51 - 75% of the time Expression Expression Mode: Verbal Expression: 2-Expresses basic 25 - 49% of the time/requires cueing 50 - 75% of the time. Uses single words/gestures. Social Interaction Social Interaction: 3-Interacts appropriately 50 - 74% of the time - May be physically or verbally inappropriate. Problem Solving Problem Solving: 1-Solves basic less than 25% of the time - needs direction nearly all the time or does not effectively solve problems and may need a restraint for safety FIM - Eating Eating Activity: 5: Needs verbal cues/supervision;4: Helper occasionally scoops food on utensil  Pain Reports pain in left leg, RN made aware and pt premedicated and repositioned   Therapy/Group: Individual Therapy  Solomiya Pascale 10/25/2013, 12:29 PM
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
## 2013-10-25 NOTE — Progress Notes (Signed)
Physical Therapy Session Note  Patient Details  Name: Cory [name] MRN: [id number] Date of Birth: 1981-10-09  Today's Date: 10/25/2013 Time: 0925-1030 and 1345-1430 Time Calculation (min): 65 min and 45 min  Short Term Goals: Week 5:  PT Short Term Goal 1 (Week 5): Patient will follow one step commands with transfers with min verbal cues and minA. PT Short Term Goal 2 (Week 5): Patient will assist with transfers 75% of the time with minA. PT Short Term Goal 3 (Week 5): Patient will demonstrate sustained attention for entireity of each trial of gait training x20'. PT Short Term Goal 4 (Week 5): Patient will demonstrate sustained attention to therapeutic activity x30" with mod verbal cues.  Skilled Therapeutic Interventions/Progress Updates:    AM Session: Patient received sitting in [place], from OT. Session focused on cognitive remediation with emphasis on orientation, following simple/one step commands during functional mobility, sustained attention to therapeutic activities, timed toileting to void, counting and sorting items (finding specific colored pegs, sorting pegs based on colors, sorting cards from field of two, counting cards, identifying letters). Patient requires max encouragement to complete activities--able to sort colors with min cues, unable to identify letters (however, this was at end of session when patient with increased fatigue/decreased tolerance for activity). Patient with continent episode of bladder; able to successfully void on toilet despite contradicting/inconsistent verbal reports of need to void. Patient continues to demonstrate behavioral component of decreased participation, decreased frustration tolerance, and perseveration to return to bed.  PM Session: [place] with OT with focus on bed mobility and transfer into w/c with sustained attention and ability to follow simple commands with functional mobility and therapeutic activities. Timed toileting trial  (patient unable to void), engaged in washing hands at sink with max cuing for sequencing and sustained attention to task, use of R hand, functional ambulation with KI on L LE 3 musketeers style (B UEs around therapist's shoulders) 2x with emphasis on decreasing step size and maintaining upright trunk posture, manual facilitation to decrease L step length. Patient able to complete putting 10 yellow pegs in peg board from a field of 50 colored pegs with mod cuing for sustained attention. Patient engaged in simple pipe tree puzzle, requires max cues to sustain attention and participate, patient with very minimal frustration tolerance for activity. Patient returned to enclosure bed at end of session.  Therapy Documentation Precautions:  Precautions Precautions: Fall Precaution Comments:  L LE KI on during movement, ok to be off in bed and sitting in w/c Required Braces or Orthoses: Knee Immobilizer - Left Knee Immobilizer - Left: On when out of bed or walking Other Brace/Splint: Bil PRAFOs and L UE and LE splint can be removed for ROM with Ot/PT only.   Restrictions Weight Bearing Restrictions: Yes LUE Weight Bearing: Touch down weight bearing LLE Weight Bearing: Touchdown weight bearing (essentially NWB due to cognition) LLE Partial Weight Bearing Percentage or Pounds: 50 Locomotion : Ambulation Ambulation/Gait Assistance: 1: +2 Total assist   See FIM for current functional status  Therapy/Group: Individual Therapy and Co-Treatment for PM session with OT and Rec Therapy  Chipper Herb. Fleeta Kunde, PT, DPT  10/25/2013, 12:18 PM

## 2013-10-25 NOTE — Progress Notes (Signed)
Recreational Therapy Session Note  Patient Details  Name: Cory [name] MRN: [id number] Date of Birth: 11-Sep-1981 Today's Date: 10/25/2013  Pain: no c/o Skilled Therapeutic Interventions/Progress Updates: Pt seated in w/c upon my arrival.  Session focused on selective attention in minimally distractive gym, visual scanning, problem solving, BUE use for pipe tree activity.  Pt required max cues for task completion.  Returned to room & PT assisted pt back to bed.  Ethelean Colla 10/25/2013, 3:42 PM

## 2013-10-25 NOTE — Progress Notes (Signed)
Social Work Patient ID: Cory [name], male   DOB: 07-Jun-1981, 32 y.o.   MRN: [id number]  Review of team conference yesterday with mother and provided update on SNF bed search.  Mother pleased with gains this past week in pt's overall attention and orientation.  She understands that I am continuing to pursue SNF and the problems I am having getting a bed due to [facility] app still pending.  Alerted her that I have been speaking with RN evaluator with [facility] and looking more likely that when we do get a bed, that it will be much further out than she had hoped.  Have re-sent pt's information to SNFs.  Have also sent current progress notes to [facility] reviewer in hopes this MIGHT move along the MA/ SSD application decisions.   Continue to follow for support and d/c planning.  Wes Lezotte, LCSW

## 2013-10-25 NOTE — Progress Notes (Signed)
Occupational Therapy Session Note  Patient Details  Name: Cory [name] MRN: [id number] Date of Birth: 09-Dec-1980  Today's Date: 10/25/2013 Time: 0865-7846 Time Calculation (min): 55 min  Short Term Goals: Week 6:  OT Short Term Goal 1 (Week 6): Pt will bathe with mod A at shower level with extra time OT Short Term Goal 2 (Week 6): Pt will perform 2/3 grooming tasks with mod cuing  OT Short Term Goal 3 (Week 6): Pt will demonstrate sustained attention for 3 min with mod A to a functional tasks OT Short Term Goal 4 (Week 6): Pt will consistently answer yes/no questions with 50% accuracy in an hr session   Skilled Therapeutic Interventions/Progress Updates:    1:1 self care retraining including toileting, toilet transfer, showering sitting on roll on shower chair, dressing with max encouragement to continue attempt on his own first, sustained attention to functional task, bilateral UE use and coordination esp right hand coordination, simple problem solving, sit to stand and standing balance, engaging in eating breakfast with making needs and wants know, management of bite size and oral hygiene. Mom present for session to observe but didn't participate.  2nd session 13:30-13:45  1:1 [place] with physical therapy. Focus on bed mobility and transfer into w.c with sustained attention and ability to follow simple commands, timed toileting trial (pt dry but did nt void on toilet), engaged in washing hands at sink with max cuing for sequencing and follow through of task, engagement of right hand, functional ambulation with KI on left LE 3 musketeers style 2x with attention to step size and maintaining upright trunk posture. Pt also able to complete putting 10 yellow  pegs in peg board from a field of 50 colored pegs with mod cuing for sustained attention.  Therapy Documentation Precautions:  Precautions Precautions: Fall Precaution Comments:  L LE KI on during movement, ok to be off in bed and  sitting in w/c Required Braces or Orthoses: Knee Immobilizer - Left Knee Immobilizer - Left: On when out of bed or walking Other Brace/Splint: Bil PRAFOs and L UE and LE splint can be removed for ROM with Ot/PT only.   Restrictions Weight Bearing Restrictions: Yes LUE Weight Bearing: Touch down weight bearing LLE Weight Bearing: Touchdown weight bearing (essentially NWB due to cognition) LLE Partial Weight Bearing Percentage or Pounds: 50 Pain: No c/o pain  See FIM for current functional status  Therapy/Group: Individual Therapy  [name] 10/25/2013, 2:40 PM

## 2013-10-26 ENCOUNTER — Inpatient Hospital Stay (HOSPITAL_COMMUNITY): Payer: Medicaid Other

## 2013-10-26 NOTE — Progress Notes (Signed)
Subjective/Complaints: Incontinent bowel, ate well,  " I want water"  Speaking but with poor orientation A  review of systems has been performed and if not noted above is otherwise negative. .  Objective: Vital Signs: Blood pressure 97/59, pulse 63, temperature 97.4 F (36.3 C), temperature source Axillary, resp. rate 18, height 5' 2.99" (1.6 m), weight 31.6 kg (69 lb 10.7 oz), SpO2 96.00%. No results found. No results found for this basename: WBC, HGB, HCT, PLT,  in the last 72 hours No results found for this basename: NA, K, CL, CO, GLUCOSE, BUN, CREATININE, CALCIUM,  in the last 72 hours CBG (last 3)  No results found for this basename: GLUCAP,  in the last 72 hours  Wt Readings from Last 3 Encounters:  10/24/13 31.6 kg (69 lb 10.7 oz)  09/13/13 46.811 kg (103 lb 3.2 oz)  09/13/13 46.811 kg (103 lb 3.2 oz)    Physical Exam:  Constitutional:   Alert to restless, alternating, supine to sit as well as dual leg lifts, will not cooperate with MMT Eyes: Right eye exhibits no discharge. Left eye exhibits no discharge.  Neck:  Janina Mayo out , stoma closed Cardiovascular: Normal rate and regular rhythm.  Respiratory: Effort normal.  GI: Soft. Stoma with scab no drainage He has no cervical adenopathy.  Neurological:    Awake. Remains distracted. Makes eye contacts and does speak/mumble words at times. Musc:  Left arm flexes and extends.   Skin: wounds are healing. Left shin slightly reddened due to scar, minimal to no drainage seen from small wound   Assessment/Plan: 1. 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 Physiatrist is providing close team supervision and 24 hour management of active medical problems listed below. Physiatrist and rehab team continue to assess barriers to discharge/monitor patient progress toward functional and medical goals.       FIM: FIM  - Bathing Bathing Steps Patient Completed: Chest;Abdomen;Right upper leg;Left upper leg;Right Arm Bathing: 3: Mod-Patient completes 5-7 34f 10 parts or 50-74%  FIM - Upper Body Dressing/Undressing Upper body dressing/undressing steps patient completed: Thread/unthread right sleeve of pullover shirt/dresss;Thread/unthread left sleeve of pullover shirt/dress;Put head through opening of pull over shirt/dress;Pull shirt over trunk Upper body dressing/undressing: 4: Steadying assist FIM - Lower Body Dressing/Undressing Lower body dressing/undressing steps patient completed: Thread/unthread right pants leg;Pull pants up/down;Don/Doff left sock;Don/Doff left shoe Lower body dressing/undressing: 3: Mod-Patient completed 50-74% of tasks  FIM - Toileting Toileting steps completed by patient: Adjust clothing prior to toileting Toileting Assistive Devices: Grab bar or rail for support Toileting: 1: Two helpers  FIM - Diplomatic Services operational officer Devices: Therapist, music Transfers: 3-To toilet/BSC: Mod A (lift or lower assist);3-From toilet/BSC: Mod A (lift or lower assist)  FIM - Bed/Chair Transfer Bed/Chair Transfer Assistive Devices: Arm rests Bed/Chair Transfer: 4: Bed > Chair or W/C: Min A (steadying Pt. > 75%);3: Chair or W/C > Bed: Mod A (lift or lower assist);4: Supine > Sit: Min A (steadying Pt. > 75%/lift 1 leg);4: Sit > Supine: Min A (steadying pt. > 75%/lift 1 leg)  FIM - Locomotion: Wheelchair Distance: 50 Locomotion: Wheelchair: 1: Total Assistance/staff pushes wheelchair (Pt<25%) FIM - Locomotion: Ambulation Locomotion: Ambulation Assistive Devices:  (2 person "three musketeers" assist) Ambulation/Gait Assistance: 1: +2 Total assist Locomotion: Ambulation: 1: Two helpers  Comprehension Comprehension Mode: Auditory Comprehension: 2-Understands basic 25 - 49% of the time/requires cueing 51 - 75% of the time  Expression Expression Mode: Verbal Expression:  2-Expresses basic 25 - 49% of the time/requires cueing 50 - 75% of the time. Uses single words/gestures.  Social Interaction Social Interaction: 3-Interacts appropriately 50 - 74% of the time - May be physically or verbally inappropriate.  Problem Solving Problem Solving: 1-Solves basic less than 25% of the time - needs direction nearly all the time or does not effectively solve problems and may need a restraint for safety  Memory Memory: 1-Recognizes or recalls less than 25% of the time/requires cueing greater than 75% of the time  Medical Problem List and Plan:  1. DVT Prophylaxis/Anticoagulation: Pharmaceutical: Lovenox  2. Pain Management: Prn oxycodone for outward signs of pain.  3. Mood: Currently too low level to evaluate.  4. Neuropsych: This patient is not capable of making decisions on his own behalf.   -reduced amantadine  -some of agitation may be him acting out a little. As a whole he has shown some neurological improvement 5. Recurrent Fever: afebrile    -wounds improving 6. Left tibial plateau Fx--s/p ORIF: PWB in KI (not really practical). ---essentially will be non-weight bearing 7. Left humerus Fx--s/p ORIF: NWB- may be out of splint while in bed  -HO and MO surrounding injury site unchanged 8. Leucocytosis: improving 9. Left shin and heel wound: Continue bilateral PRAFO.  Marland Kitchen. Keflex initiated for coverage without obvious change 10. ABLA: stable 11. Dysphagia: Continue TF for supplement. Continue D2, honey liquids with assistance.  12. Agitation: seroquel- decreased to hs only, continue inderal and zoloft     -ativan prn. [place] bed for safety remains indicated  -treating pain 13. FEN: good po appreciate dietician notes LOS (Days) 43 A FACE TO FACE EVALUATION WAS PERFORMED  KIRSTEINS,ANDREW E 10/26/2013 8:07 AM

## 2013-10-26 NOTE — Progress Notes (Signed)
Speech Language Pathology Daily Session Note  Patient Details  Name: Cory [name] MRN: [id number] Date of Birth: 26-Jan-1981  Today's Date: 10/26/2013 Time: 1500-1530 Time Calculation (min): 30 min  Short Term Goals: Week 6: SLP Short Term Goal 1 (Week 6): Pt will consume trials of thin liquids without overt s/s of aspiration with Max A multimodal cueing SLP Short Term Goal 2 (Week 6): Pt will consume Dys. 2 textures with nectar-thick liquids with minimal overt s/s of aspiration with Max A verbal cues for utilization of small bites/sips.  SLP Short Term Goal 3 (Week 6): pt will initiate functional tasks with 75% of opportunities with Mod A multimodal cueing  SLP Short Term Goal 4 (Week 6): Pt will demonstrate sustained attention during a functioanl task for 3 minutes with Max A verbal cues for redirection  SLP Short Term Goal 5 (Week 6): Pt will respond to yes/no questions in regards to functional information with gestures/verbalizations with 75% of opportunities SLP Short Term Goal 6 (Week 6): Pt will follow 1 step commands with 90% of opportunities with Mod A multimodal cueing   Skilled Therapeutic Interventions: Skilled treatment focused on swallowing and cognitive goals. Pt impulsive with decreased safety awareness, attempting to get up OOB without assistance. SLP facilitated session with skilled observation of thin liquid trials via cup sips. Pt required Max cues for utilization of safe swallowing strategies, with no overt s/s of aspiration observed. Pt continues to spontaneously verbally communicate his wants/needs primarily in one-word or short-phrase utterances, although imprecise articulation impacts speech intelligibility. Upon completion of session, pt was left at the nursing station with nurse tech. Continue plan of care.   FIM:  Comprehension Comprehension Mode: Auditory Comprehension: 2-Understands basic 25 - 49% of the time/requires cueing 51 - 75% of the  time Expression Expression Mode: Verbal Expression: 2-Expresses basic 25 - 49% of the time/requires cueing 50 - 75% of the time. Uses single words/gestures. Social Interaction Social Interaction: 3-Interacts appropriately 50 - 74% of the time - May be physically or verbally inappropriate. Problem Solving Problem Solving: 1-Solves basic less than 25% of the time - needs direction nearly all the time or does not effectively solve problems and may need a restraint for safety Memory Memory: 1-Recognizes or recalls less than 25% of the time/requires cueing greater than 75% of the time FIM - Eating Eating Activity: 5: Needs verbal cues/supervision  Pain Pain Assessment Pain Assessment: No/denies pain Pain Score: 0-No pain  Therapy/Group: Individual Therapy   Cory [name], M.A. CCC-SLP [phone]   Cory [name], [name] 10/26/2013, 3:58 PM

## 2013-10-27 ENCOUNTER — Encounter (HOSPITAL_COMMUNITY): Payer: Medicaid - Out of State | Admitting: Occupational Therapy

## 2013-10-27 ENCOUNTER — Inpatient Hospital Stay (HOSPITAL_COMMUNITY): Payer: Medicaid Other | Admitting: Speech Pathology

## 2013-10-27 ENCOUNTER — Inpatient Hospital Stay (HOSPITAL_COMMUNITY): Payer: Medicaid Other | Admitting: *Deleted

## 2013-10-27 NOTE — Progress Notes (Signed)
Speech Language Pathology Weekly Progress & Session Notes  Patient Details  Name: Cory [name] MRN: [id number] Date of Birth: [birth year]  Today's Date: 10/27/2013  Session 1 Time: 1115-1200 Time Calculation (min): 45 min  Session 2 Time: 8250-0370  Time Calculation: 30 min  Short Term Goals: Week 6: SLP Short Term Goal 1 (Week 6): Pt will consume trials of thin liquids without overt s/s of aspiration with Max A multimodal cueing SLP Short Term Goal 1 - Progress (Week 6): Not met SLP Short Term Goal 2 (Week 6): Pt will consume Dys. 2 textures with nectar-thick liquids with minimal overt s/s of aspiration with Max A verbal cues for utilization of small bites/sips.  SLP Short Term Goal 2 - Progress (Week 6): Met SLP Short Term Goal 3 (Week 6): pt will initiate functional tasks with 75% of opportunities with Mod A multimodal cueing  SLP Short Term Goal 3 - Progress (Week 6): Not met SLP Short Term Goal 4 (Week 6): Pt will demonstrate sustained attention during a functioanl task for 3 minutes with Max A verbal cues for redirection  SLP Short Term Goal 4 - Progress (Week 6): Not met SLP Short Term Goal 5 (Week 6): Pt will respond to yes/no questions in regards to functional information with gestures/verbalizations with 75% of opportunities SLP Short Term Goal 5 - Progress (Week 6): Met SLP Short Term Goal 6 (Week 6): Pt will follow 1 step commands with 90% of opportunities with Mod A multimodal cueing  SLP Short Term Goal 6 - Progress (Week 6): Met  New Short-Term Goals:  Week 7: SLP Short Term Goal 1 (Week 7): Pt will consume trials of thin liquids without overt s/s of aspiration with Max A multimodal cueing SLP Short Term Goal 2 (Week 7): Pt will consume Dys. 2 textures with nectar-thick liquids with minimal overt s/s of aspiration with Max A verbal cues for utilization of small bites/sips.  SLP Short Term Goal 3 (Week 7): Pt will demonstrate efficient mastication of Dys. 3 textures  without overt s/s of aspiration with Max A multimodal cueing  SLP Short Term Goal 4 (Week 7): Pt will follow 1 step commands with 90% of opportunities with Min A multimodal cueing  SLP Short Term Goal 5 (Week 7): pt will initiate functional tasks with 75% of opportunities with Mod A multimodal cueing  SLP Short Term Goal 6 (Week 7): Pt will demonstrate sustained attention during a functioanl task for 3 minutes with Max A verbal cues for redirection   Weekly Progress Updates: Pt has made functional gains and has met 3 of 6 STG's due to improvements in swallowing function, following 1 step commands and verbalizing wants/needs. Currently, pt is demonstrating behaviors consistent with a Rancho Level V and requires Max-Total A for sustained attention, functional problem solving, intellectual awareness, safety awareness and working memory. Pt also requires Mod A cueing for orientation, initiation of tasks and following 1-step commands. Pt is currently verbalizing wants/needs at the word and phrase level but also demonstrates language of confusion with decreased speech intelligibility. Pt is tolerating Dys. 2 textures with nectar-thick liquids with minimal overt s/s of aspiration and requires cueing to self-monitor and correct wet vocal quality and Max A for utilization of swallowing compensatory strategies. Pt is also consuming trials of thin liquids with intermittent overt s/s of aspiration. Pt would benefit from continued skilled SLP intervention to maximize cognitive and swallowing function and functional communication in order to maximize overall functional independence.   SLP Intensity:  Minumum of 1-2 x/day, 30 to 90 minutes SLP Frequency: 5 out of 7 days SLP Duration/Estimated Length of Stay: TBD due to SNF placement  SLP Treatment/Interventions: Cueing hierarchy;Cognitive remediation/compensation;Environmental controls;Internal/external aids;Therapeutic Exercise;Speech/Language facilitation;Therapeutic  Activities;Patient/family education;Dysphagia/aspiration precaution training;Functional tasks  Daily Session Session 1: Skilled treatment session focused on addressing cognitive and dysphagia goals. SLP facilitated session with set-up of lunch tray and Max verbal cues to utilize safe swallow strategies. Patient consumed Dys.2 textures and nectar-thick liquids with an intermittent wet vocal quality which required Max multimodal cues to clear. Pt also required Max multimodal cueing for sustained attention for 1 minute to self-feeding. Pt was incontinent of urine during session but followed 1 step commands with Min-Mod verbal and visual cues for self-care tasks. Patient with spontaneous verbal expression of wants and needs with language of confusion and decreased intelligibility at times. Pt placed in enclosure bed to rest at end of session. Continue plan of care.   Session 2: Treatment focus on cognitive goals. Upon arrival, pt alert in wheelchair and propelling wheelchair around hallway without purpose. Pt required Max multimodal cueing to sustain attention to rote calendar task and required Min question and visual cues for functional problem solving throughout the task. Pt independently recalled his age and birthday when asked. Pt also reporting pain in leg, RN made aware but pt was premedicated and repositioned for comfort. Pt followed 1 step commands with Mod A verbal cues with 90% of opportunities. Continue plan of care.   FIM:  Comprehension Comprehension Mode: Auditory Comprehension: 2-Understands basic 25 - 49% of the time/requires cueing 51 - 75% of the time Expression Expression Mode: Verbal Expression: 2-Expresses basic 25 - 49% of the time/requires cueing 50 - 75% of the time. Uses single words/gestures. Social Interaction Social Interaction: 2-Interacts appropriately 25 - 49% of time - Needs frequent redirection. Problem Solving Problem Solving: 1-Solves basic less than 25% of the time -  needs direction nearly all the time or does not effectively solve problems and may need a restraint for safety Memory Memory: 1-Recognizes or recalls less than 25% of the time/requires cueing greater than 75% of the time FIM - Eating Eating Activity: 5: Needs verbal cues/supervision Pain Reports pain in leg, pt repositioned and premedicated   Therapy/Group: Individual Therapy  Glinda Natzke 10/27/2013, 4:28 PM

## 2013-10-27 NOTE — Progress Notes (Signed)
Physical Therapy Session Note  Patient Details  Name: Cory [name] MRN: [id number] Date of Birth: 05/14/1981  Today's Date: 10/27/2013 Time: 0930-1030 and 1300-1400 Time Calculation (min): 60 min and 60 min  Short Term Goals: Week 5:  PT Short Term Goal 1 (Week 5): Patient will follow one step commands with transfers with min verbal cues and minA. PT Short Term Goal 2 (Week 5): Patient will assist with transfers 75% of the time with minA. PT Short Term Goal 3 (Week 5): Patient will demonstrate sustained attention for entireity of each trial of gait training x20'. PT Short Term Goal 4 (Week 5): Patient will demonstrate sustained attention to therapeutic activity x30" with mod verbal cues.  Skilled Therapeutic Interventions/Progress Updates:    AM Session: Session focused on cognitive remediation with emphasis on orientation, following simple/one step commands during functional mobility, sustained attention to therapeutic activities (standing to throw horseshoes 3x5), counting and sorting items (finding specific colored pegs, sorting pegs based on colors). Patient able to sort colors with min cues, unable to follow two step commands with pegs (place 5 yellow then 5 red); mod cues overall for sustained attention. Functional ambulation with KI on L LE, L UE around therapist's shoulders, R hand on handrail 2x with emphasis on decreasing step size and maintaining upright trunk posture, manual facilitation to decrease L step length. Patient continues to remove L UE from around therapist and put both hands on handrail. Patient left seated in wheelchair with Posey belt on.  PM Session: Session focused on cognitive remediation with emphasis on orientation, following simple/one step commands during functional mobility, sustained attention to therapeutic activities (standing to catch a ball 3x5, adjusting tool kit). Patient with repetitive attempts to lay down throughout session and requires max cues to  participate. Patient perseverating on the phrase "I don't want to" for every activity, however is able to engage with max cues. Patient seated in wheelchair with Posey belt and handed off to SLP.  Therapy Documentation Precautions:  Precautions Precautions: Fall Precaution Comments:  L LE KI on during movement, ok to be off in bed and sitting in w/c Required Braces or Orthoses: Knee Immobilizer - Left Knee Immobilizer - Left: On when out of bed or walking Other Brace/Splint: Bil PRAFOs and L UE and LE splint can be removed for ROM with Ot/PT only.   Restrictions Weight Bearing Restrictions: Yes LUE Weight Bearing: Non weight bearing LLE Weight Bearing: Partial weight bearing LLE Partial Weight Bearing Percentage or Pounds: 50 Pain: Pain Assessment Pain Assessment: No/denies pain Pain Score: 0-No pain Locomotion : Ambulation Ambulation/Gait Assistance: 1: +2 Total assist Wheelchair Mobility Distance: 125   See FIM for current functional status  Therapy/Group: Individual Therapy  Lillia Abed. Antonia Culbertson, PT, DPT 10/27/2013, 11:52 AM

## 2013-10-27 NOTE — Progress Notes (Addendum)
Occupational Therapy Weekly Progress Note  Patient Details  Name: Cory [name] MRN: [id number] Date of Birth: January 14, 1981  Today's Date: 10/27/2013 Time: 0830-0930 Time Calculation (min): 60 min   Patient has met 3 of 4 short term goals.  Pt has made good progress since last weekly note. Pt is now verbal with simple commands, words and simple sentences. Pt now consistently showers sitting on roll in shower chair and can bathe sitting with mod A. Pt can assist with picking out clothing with mod to max A for attention. Pt needs A for orientation of clothing due to decr attention to task but can don with overall mod  A. Pt continues to require total A +2 for functional ambulation to maintain safety and 50% weight bearing through left LE. Pt is oriented to himself and 25% the hospital. Pt continues to be incontient of urine and bowel with no awareness. Pt can transfer with min  A. Pt overall requires max to total A to attend to a task longer than 1 minute. Pt can now feed himself his meal. Therapy continuing to work on functional use, strength and coordination of right UE. Pt's behavior is consistent with Rancho Level V.  Patient continues to demonstrate the following deficits: muscle weakness, decreased cardiorespiratoy endurance and acute pain in left LE, impaired timing and sequencing and decreased coordination, decreased visual acuity, decr functional use of right UE, decreased attention, decreased awareness, decreased problem solving, decreased safety awareness and decreased memory and decreased standing balance, decreased balance strategies and difficulty maintaining precautions and therefore will continue to benefit from skilled OT intervention to enhance overall performance with BADL and Reduce care partner burden.  Patient progressing toward long term goals. and LTG have been upgraded- see goal page.  Continue plan of care.  OT Short Term Goals Week 6:  OT Short Term Goal 1 (Week 6): Pt will  bathe with mod A at shower level with extra time OT Short Term Goal 1 - Progress (Week 6): Met OT Short Term Goal 2 (Week 6): Pt will perform 2/3 grooming tasks with mod cuing  OT Short Term Goal 2 - Progress (Week 6): Met OT Short Term Goal 3 (Week 6): Pt will demonstrate sustained attention for 3 min with mod A to a functional tasks OT Short Term Goal 3 - Progress (Week 6): Progressing toward goal OT Short Term Goal 4 (Week 6): Pt will consistently answer yes/no questions with 50% accuracy in an hr session  OT Short Term Goal 4 - Progress (Week 6): Met Week 7:  OT Short Term Goal 1 (Week 7): Pt will demonstrate sustained attention to functional task for 3 min with mod A  OT Short Term Goal 2 (Week 7): Pt will  transfer bed<>w/c with supervision OT Short Term Goal 3 (Week 7): Pt don pants with steadying A  OT Short Term Goal 4 (Week 7): Pt would continently  void 15% of the day with timed toileting  Skilled Therapeutic Interventions/Progress Updates:    1:1 self care retraining at shower level.  Pt had been toileted by nursing ~30 min before session and pt found to be incontinent of urine in brief. Pt with no awareness. Pt engaged in bathing at shower level with total cuing for bathing 7/10 parts with more than reasonable amt of time. Pt assisted with picking out clothing to don but needs A for orientation of clothing. Pt required extra time to initiate completing the task. Pt easily internally distracted by pain in left  LE and wanting "staff to hurry up" but without reason and inability to say what he really wants.   Therapy Documentation Precautions:  Precautions Precautions: Fall Precaution Comments:  L LE KI on during movement, ok to be off in bed and sitting in w/c Required Braces or Orthoses: Knee Immobilizer - Left Knee Immobilizer - Left: On when out of bed or walking Other Brace/Splint: Bil PRAFOs and L UE and LE splint can be removed for ROM with Ot/PT only.   Restrictions Weight  Bearing Restrictions: Yes LUE Weight Bearing: Non weight bearing LLE Weight Bearing: Partial weight bearing LLE Partial Weight Bearing Percentage or Pounds: 50 Pain: Pain Assessment Pain Assessment: No/denies pain Pain Score: 0-No pain  See FIM for current functional status  Therapy/Group: Individual Therapy  W[facility]en [facility] 10/27/2013, 2:09 PM

## 2013-10-27 NOTE — Progress Notes (Signed)
Subjective/Complaints: Oriented to person only  "good morning" A  review of systems has been performed and if not noted above is otherwise negative. .  Objective: Vital Signs: Blood pressure 90/71, pulse 73, temperature 97.6 F (36.4 C), temperature source Axillary, resp. rate 18, height 5' 2.99" (1.6 m), weight 31.6 kg (69 lb 10.7 oz), SpO2 96.00%. No results found. No results found for this basename: WBC, HGB, HCT, PLT,  in the last 72 hours No results found for this basename: NA, K, CL, CO, GLUCOSE, BUN, CREATININE, CALCIUM,  in the last 72 hours CBG (last 3)  No results found for this basename: GLUCAP,  in the last 72 hours  Wt Readings from Last 3 Encounters:  10/24/13 31.6 kg (69 lb 10.7 oz)  09/13/13 46.811 kg (103 lb 3.2 oz)  09/13/13 46.811 kg (103 lb 3.2 oz)    Physical Exam:  Constitutional:   Alert to restless, alternating, supine to sit as well as dual leg lifts, will not cooperate with MMT Eyes: Right eye exhibits no discharge. Left eye exhibits no discharge.  Neck:  [name] , stoma closed Cardiovascular: Normal rate and regular rhythm.  Respiratory: Effort normal.  GI: Soft. Stoma with scab no drainageNeurological:    Awake. Remains distracted. Makes eye contacts and does speak/mumble words at times. Musc:  Left arm flexes and extends.      Assessment/Plan: 1. 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 Physiatrist is providing close team supervision and 24 hour management of active medical problems listed below. Physiatrist and rehab team continue to assess barriers to discharge/monitor patient progress toward functional and medical goals.       FIM: FIM - Bathing Bathing Steps Patient Completed: Chest;Abdomen;Right upper leg;Left upper leg;Right Arm Bathing: 3: Mod-Patient completes 5-7 [id number] 10 parts or 50-74%  FIM - Upper Body  Dressing/Undressing Upper body dressing/undressing steps patient completed: Thread/unthread right sleeve of pullover shirt/dresss;Thread/unthread left sleeve of pullover shirt/dress;Put head through opening of pull over shirt/dress;Pull shirt over trunk Upper body dressing/undressing: 4: Steadying assist FIM - Lower Body Dressing/Undressing Lower body dressing/undressing steps patient completed: Thread/unthread right pants leg;Pull pants up/down;Don/Doff left sock;Don/Doff left shoe Lower body dressing/undressing: 3: Mod-Patient completed 50-74% of tasks  FIM - Toileting Toileting steps completed by patient: Adjust clothing prior to toileting Toileting Assistive Devices: Grab bar or rail for support Toileting: 1: Two helpers  FIM - Diplomatic Services operational officerToilet Transfers Toilet Transfers Assistive Devices: Therapist, musicGrab bars Toilet Transfers: 3-To toilet/BSC: Mod A (lift or lower assist);3-From toilet/BSC: Mod A (lift or lower assist)  FIM - Bed/Chair Transfer Bed/Chair Transfer Assistive Devices: Arm rests Bed/Chair Transfer: 4: Bed > Chair or W/C: Min A (steadying Pt. > 75%);3: Chair or W/C > Bed: Mod A (lift or lower assist);4: Supine > Sit: Min A (steadying Pt. > 75%/lift 1 leg);4: Sit > Supine: Min A (steadying pt. > 75%/lift 1 leg)  FIM - Locomotion: Wheelchair Distance: 50 Locomotion: Wheelchair: 1: Total Assistance/staff pushes wheelchair (Pt<25%) FIM - Locomotion: Ambulation Locomotion: Ambulation Assistive Devices:  (2 person "three musketeers" assist) Ambulation/Gait Assistance: 1: +2 Total assist Locomotion: Ambulation: 1: Two helpers  Comprehension Comprehension Mode: Auditory Comprehension: 2-Understands basic 25 - 49% of the time/requires cueing 51 - 75% of the time  Expression Expression Mode: Verbal Expression: 2-Expresses basic 25 - 49% of the time/requires cueing 50 - 75% of the time. Uses single words/gestures.  Social Interaction Social Interaction: 3-Interacts appropriately 50 -  74% of the  time - May be physically or verbally inappropriate.  Problem Solving Problem Solving: 1-Solves basic less than 25% of the time - needs direction nearly all the time or does not effectively solve problems and may need a restraint for safety  Memory Memory: 1-Recognizes or recalls less than 25% of the time/requires cueing greater than 75% of the time  Medical Problem List and Plan:  1. DVT Prophylaxis/Anticoagulation: Pharmaceutical: Lovenox  2. Pain Management: Prn oxycodone for outward signs of pain.  3. Mood: Currently too low level to evaluate.  4. Neuropsych: This patient is not capable of making decisions on his own behalf.   -reduced amantadine  -some of agitation may be him acting out a little. As a whole he has shown some neurological improvement 5. Recurrent Fever: afebrile    -wounds improving 6. Left tibial plateau Fx--s/p ORIF: PWB in KI (not really practical). ---essentially will be non-weight bearing 7. Left humerus Fx--s/p ORIF: NWB- may be out of splint while in bed  -HO and MO surrounding injury site unchanged 8. Leucocytosis: improving 9. Left shin and heel wound: Continue bilateral PRAFO.  Marland Kitchen Keflex initiated for coverage without obvious change 10. ABLA: stable 11. Dysphagia: Continue TF for supplement. Continue D2, honey liquids with assistance.  Good intake 12. Agitation: seroquel- decreased to hs only, continue inderal and zoloft     -ativan prn. [place] bed for safety remains indicated  -treating pain 13. FEN: good po appreciate dietician notes LOS (Days) 44 A FACE TO FACE EVALUATION WAS PERFORMED  Adlee Paar E 10/27/2013 7:04 AM

## 2013-10-28 ENCOUNTER — Inpatient Hospital Stay (HOSPITAL_COMMUNITY): Payer: Medicaid Other | Admitting: *Deleted

## 2013-10-28 ENCOUNTER — Inpatient Hospital Stay (HOSPITAL_COMMUNITY): Payer: Medicaid Other | Admitting: Occupational Therapy

## 2013-10-28 DIAGNOSIS — Z5189 Encounter for other specified aftercare: Secondary | ICD-10-CM

## 2013-10-28 NOTE — Progress Notes (Signed)
Occupational Therapy Session Note  Patient Details  Name: Cory [name] MRN: [id number] Date of Birth: [birth year]  Today's Date: 10/28/2013 Time: [id number] Time Calculation (min): 60 min  Skilled Therapeutic Interventions/Progress Updates: Attempted attention to task and orientation tasks.  Patient very restless and repeatedly stated, "Let's go!"  He requested drinks often as well but continually asked for more while this clinician attempted to thicken to correct consistency.  Patient somewhat satisfied and with less verbalizations of "Let's go" when this clinician assisted him with w/c propulsion between [address] and [address] hall ways.    pateint was offered toileting x2 during the session, and he stated he denied need or urge.  Patient assisted to rest and fastened in veil bed at end of session as he asked to ly down.  RN and tech were made aware of his request to ly in bed and rest.    Therapy Documentation Precautions:  Precautions Precautions: Fall Precaution Comments:  L LE KI on during movement, ok to be off in bed and sitting in w/c Required Braces or Orthoses: Knee Immobilizer - Left Knee Immobilizer - Left: On when out of bed or walking Other Brace/Splint: Bil PRAFOs and L UE and LE splint can be removed for ROM with Ot/PT only.   Restrictions Weight Bearing Restrictions: Yes LUE Weight Bearing: Non weight bearing LLE Weight Bearing: Touchdown weight bearing LLE Partial Weight Bearing Percentage or Pounds: 50  Pain: patient only stated his right arm or leg hurt when given instructions to complete tasks using either by this clinician; however, he did not wince or verbalize pain when he initiated use of R UE or LE   ASee FIM for current functional status  Therapy/Group: Individual Therapy  Bud Faceickett, [name] [facility] 10/28/2013, 2:26 PM

## 2013-10-28 NOTE — Progress Notes (Signed)
Patient ID: Cory [name], male   DOB: [birth year], 33 y.o.   MRN: [id number]    Subjective/Complaints: 1/3. A 33 year old patient admitted to rehabilitation service with multitrauma and somatic brain injury. No complaints but very little verbal response. Remains in enclosure bed   Objective: Vital Signs: Blood pressure 121/54, pulse 87, temperature 96.9 F (36.1 C), temperature source Axillary, resp. rate 17, height 5' 2.99" (1.6 m), weight 43.3 kg (95 lb 7.4 oz), SpO2 96.00%. No results found. No results found for this basename: WBC, HGB, HCT, PLT,  in the last 72 hours No results found for this basename: NA, K, CL, CO, GLUCOSE, BUN, CREATININE, CALCIUM,  in the last 72 hours CBG (last 3)  No results found for this basename: GLUCAP,  in the last 72 hours  Wt Readings from Last 3 Encounters:  10/28/13 43.3 kg (95 lb 7.4 oz)  09/13/13 46.811 kg (103 lb 3.2 oz)  09/13/13 46.811 kg (103 lb 3.2 oz)   Patient Vitals for the past 24 hrs:  BP Temp Temp src Pulse Resp SpO2 Weight  10/28/13 0500 121/54 mmHg 96.9 F (36.1 C) Axillary 87 17 96 % 43.3 kg (95 lb 7.4 oz)  10/27/13 1636 104/80 mmHg - - 66 18 100 % -    No intake or output data in the 24 hours ending 10/28/13 1038   Physical Exam:  Constitutional:   Alert to restless, HEENT neg Neck:  Trach out , stoma closed Cardiovascular: Normal rate and regular rhythm.  Respiratory: Effort normal.  GI: Soft. Stoma with scab no drainage Neurological:    Awake. Remains distracted. Makes eye contacts and does speak/mumble words at times.    Assessment/Plan: 1. 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 Left tibial plateau Fx--s/p ORIF: PWB in KI (not really practical). ---essentially will be non-weight bearing 3. Left humerus Fx--s/p ORIF: NWB- may be out of splint while in bed  LOS (Days) 45 A FACE TO FACE EVALUATION WAS PERFORMED  Cory [name] 10/28/2013 10:38 AM

## 2013-10-28 NOTE — Progress Notes (Signed)
Physical Therapy Session Note  Patient Details  Name: Cory [name] MRN: [id number] Date of Birth: Mar 08, 1981  Today's Date: 10/28/2013 Time: 1130-1157 and 1300-1355 Time Calculation (min): 27 min and 55 min  Short Term Goals: Week 5:  PT Short Term Goal 1 (Week 5): Patient will follow one step commands with transfers with min verbal cues and minA. PT Short Term Goal 2 (Week 5): Patient will assist with transfers 75% of the time with minA. PT Short Term Goal 3 (Week 5): Patient will demonstrate sustained attention for entireity of each trial of gait training x20'. PT Short Term Goal 4 (Week 5): Patient will demonstrate sustained attention to therapeutic activity x30" with mod verbal cues.  Skilled Therapeutic Interventions/Progress Updates:    AM Session: Patient received supine in bed. Patient oriented to person and place. Re-oriented to situation and time. Session focused on cognitive remediation with emphasis on orientation, following simple/one step commands during functional mobility, and sustained attention to therapeutic activities (NuStep Level 4 with B LE and L UE x4'). Patient unable to maintain R grip on handle. Patient able to sustain attention on NuStep activity for approximately 10-15" at a time, requires max cues to sustain attention for longer periods of time, however patient able to perform activity on and off for 4 min total. Patient left seated in wheelchair with Posey belt on at RN station for lunch.  PM Session: Patient received supine in bed. Session focused on following one step commands during functional mobility and sustained attention to functional mobility/therapeutic activities. L LE KI donned for gait training. Patient performed gait training with use of MaxiSky 30' x1 without AD, 30'x2 with Cory [name] (R hand uses handle to grip, but therapist assists with maintaining NWB through R UE). Noted decreased L step length, improved pacing, and improved upright posture with  use of Cory [name]. Patient able to sustain attention and follow simple commands during gait training with mod cues. Patient able to sustain attention long enough to shoot baskets with basketball (seated), with SBA, 1x4 shots and 1x5 shots with mod cues to maintain attention to task. Patient performed wheelchair mobility back to room. Patient able to recall name of this therapist after approximately 1 min. Patient returned to enclosure bed.  Therapy Documentation Precautions:  Precautions Precautions: Fall Precaution Comments:  L LE KI on during movement, ok to be off in bed and sitting in w/c Required Braces or Orthoses: Knee Immobilizer - Left Knee Immobilizer - Left: On when out of bed or walking Restrictions Weight Bearing Restrictions: Yes LUE Weight Bearing: Non weight bearing LLE Weight Bearing: Touchdown weight bearing LLE Partial Weight Bearing Percentage or Pounds: 50 Locomotion : Ambulation Ambulation/Gait Assistance: Not tested (comment) Wheelchair Mobility Distance: 130   See FIM for current functional status  Therapy/Group: Individual Therapy  Chipper HerbBridget S Othelia Riederer S. Tahmir Kleckner, PT, DPT 10/28/2013, 11:59 AM

## 2013-10-28 NOTE — Progress Notes (Signed)
Occupational Therapy Session Note  Patient Details  Name: Cory [name] MRN: [id number] Date of Birth: [birth year]  Today's Date: 10/28/2013 Time: 1515-1600 Time Calculation (min): 45 min  Skilled Therapeutic Interventions/Progress Updates: For afternoon session today, though patient continually stated, "Let's go," or "I don't want to," he was able to focus for  Approximately 20 seconds to demonstrate selective attention and participated in one round of trying to throw rings onto the pegs on the floor.  He was offered the opportunity to stand to complete the task but stated, "I don't want to."  Patient was able to indicate his left leg hurt.   RN gave him pain medicine.  At the end of the session, he was left in his w/c with his soft safety belt in place with supervision at the nurses station.    Therapy Documentation Precautions:  Precautions Precautions: Fall Precaution Comments:  L LE KI on during movement, ok to be off in bed and sitting in w/c Required Braces or Orthoses: Knee Immobilizer - Left Knee Immobilizer - Left: On when out of bed or walking Other Brace/Splint: Bil PRAFOs and L UE and LE splint can be removed for ROM with Ot/PT only.   Restrictions Weight Bearing Restrictions: Yes LUE Weight Bearing: Non weight bearing LLE Weight Bearing: Touchdown weight bearing LLE Partial Weight Bearing Percentage or Pounds: 50   Pain:replied "yes" when asked if his left leg hurt as figited and kept looking at or pointing to this left leg.  RN gave pain medication  See FIM for current functional status  Therapy/Group: Individual Therapy  Bud Faceickett, Carinne Brandenburger [facility]ists[facility] 10/28/2013, 5:04 PM

## 2013-10-29 NOTE — Progress Notes (Signed)
Patient ID: Cory [name], male   DOB: July 20, 1981, 33 y.o.   MRN: [id number]  Patient ID: Cory [name], male   DOB: July 20, 1981, 33 y.o.   MRN: [id number]    Subjective/Complaints:  1/4. Will A 33 year old patient admitted to rehabilitation service with multitrauma and  Traumatic  brain injury. No complaints but very little verbal response. Remains in enclosure bed   Objective: Vital Signs: Blood pressure 115/51, pulse 61, temperature 98.2 F (36.8 C), temperature source Oral, resp. rate 17, height 5' 2.99" (1.6 m), weight 43.6 kg (96 lb 1.9 oz), SpO2 100.00%. No results found. No results found for this basename: WBC, HGB, HCT, PLT,  in the last 72 hours No results found for this basename: NA, K, CL, CO, GLUCOSE, BUN, CREATININE, CALCIUM,  in the last 72 hours CBG (last 3)  No results found for this basename: GLUCAP,  in the last 72 hours  Wt Readings from Last 3 Encounters:  10/29/13 43.6 kg (96 lb 1.9 oz)  09/13/13 46.811 kg (103 lb 3.2 oz)  09/13/13 46.811 kg (103 lb 3.2 oz)   Patient Vitals for the past 24 hrs:  BP Temp Temp src Pulse Resp SpO2 Weight  10/29/13 0600 115/51 mmHg 98.2 F (36.8 C) Oral 61 17 100 % 43.6 kg (96 lb 1.9 oz)    No intake or output data in the 24 hours ending 10/29/13 1058   Physical Exam:  Constitutional:   Alert to restless,  one-word response only  HEENT neg Neck:  Trach out , stoma closed Cardiovascular: Normal rate and regular rhythm.  Respiratory: Effort normal.  GI: Soft. Stoma with scab no drainage Neurological:    Awake. Remains distracted. Makes eye contacts and does speak/mumble words at times.    Assessment/Plan: 1. 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 Left tibial plateau Fx--s/p ORIF: PWB in KI (not really practical). ---essentially will be non-weight bearing 3. Left humerus Fx--s/p ORIF: NWB- may be out of splint while in bed  LOS (Days) 46 A FACE TO FACE EVALUATION WAS  PERFORMED  Rogelia BogaKWIATKOWSKI,Aydenn Gervin FRANK 10/29/2013 10:58 AM

## 2013-10-30 ENCOUNTER — Encounter (HOSPITAL_COMMUNITY): Payer: Medicaid - Out of State | Admitting: Occupational Therapy

## 2013-10-30 ENCOUNTER — Inpatient Hospital Stay (HOSPITAL_COMMUNITY): Payer: Medicaid Other

## 2013-10-30 ENCOUNTER — Inpatient Hospital Stay (HOSPITAL_COMMUNITY): Payer: Medicaid - Out of State

## 2013-10-30 ENCOUNTER — Inpatient Hospital Stay (HOSPITAL_COMMUNITY): Payer: Medicaid Other | Admitting: *Deleted

## 2013-10-30 DIAGNOSIS — IMO0002 Reserved for concepts with insufficient information to code with codable children: Secondary | ICD-10-CM

## 2013-10-30 DIAGNOSIS — S069XAA Unspecified intracranial injury with loss of consciousness status unknown, initial encounter: Secondary | ICD-10-CM

## 2013-10-30 DIAGNOSIS — I69991 Dysphagia following unspecified cerebrovascular disease: Secondary | ICD-10-CM

## 2013-10-30 DIAGNOSIS — S069X9A Unspecified intracranial injury with loss of consciousness of unspecified duration, initial encounter: Secondary | ICD-10-CM

## 2013-10-30 NOTE — Progress Notes (Signed)
Physical Therapy Session Note  Patient Details  Name: Cory [name] MRN: [id number] Date of Birth: 04-29-1981  Today's Date: 10/30/2013 Time: 4098-11910931-1030 and 1330-1430 Time Calculation (min): 59 min and 60 min  Short Term Goals: Week 5:  PT Short Term Goal 1 (Week 5): Patient will follow one step commands with transfers with min verbal cues and minA. PT Short Term Goal 2 (Week 5): Patient will assist with transfers 75% of the time with minA. PT Short Term Goal 3 (Week 5): Patient will demonstrate sustained attention for entireity of each trial of gait training x20'. PT Short Term Goal 4 (Week 5): Patient will demonstrate sustained attention to therapeutic activity x30" with mod verbal cues.  Skilled Therapeutic Interventions/Progress Updates:    AM Session: Patient received sitting in wheelchair. Session focused on cognitive remediation with emphasis on orientation, following simple/one step commands during functional mobility, and sustained attention to therapeutic activities. L LE KI donned for standing in standing frame 3 x 4-5' each with emphasis on bimanual integration with opening/closing pill bottles, sorting colored clothespins. NuStep Level 4 with B LE and L UE x4', patient requires manual facilitation to maintain R grip on handle. Patient able to sustain attention on NuStep activity for approx 10-15" at a time, requires max cues to sustain attention for longer periods of time, however patient able to perform activity on and off for 4 min total. Seated ball tossing with emphasis on use of B hands. Patient oriented to person. Re-oriented to situation and time. Patient seated in wheelchair and handed off to OT.  PM Session: Patient received supine in bed. Session focused on timed toileting trial, functional ambulation, and cognitive remediation with emphasis on sequencing and problem-solving. Patient continent of bladder during timed toileting trial. Patient performed gait training with +2  assist (3 musketeer style) 8255' x1 and 7375' x1 with L KI donned. Cognitive tasks with colored shapes with emphasis on patterns/designs and patient replicating designs, requires maxA for problem solving and sustained attention to activity. Activity with matching colored blocks with same colored space, maxA for problem solving and sustained attention to activity. Patient continues to demonstrate significant verbal perseveration and poor frustration tolerance throughout therapy sessions.  Therapy Documentation Precautions:  Precautions Precautions: Fall Precaution Comments:  L LE KI on during movement, ok to be off in bed and sitting in w/c Required Braces or Orthoses: Knee Immobilizer - Left Knee Immobilizer - Left: On when out of bed or walking Restrictions Weight Bearing Restrictions: Yes LUE Weight Bearing: Non weight bearing LLE Weight Bearing: Touchdown weight bearing LLE Partial Weight Bearing Percentage or Pounds: 50 Locomotion : Ambulation Ambulation/Gait Assistance: Not tested (comment) Wheelchair Mobility Distance: 125   See FIM for current functional status  Therapy/Group: Individual Therapy  Chipper HerbBridget S Psalm Schappell S. Tatyana Biber, PT, DPT 10/30/2013, 11:50 AM

## 2013-10-30 NOTE — Progress Notes (Signed)
Occupational Therapy Session Note  Patient Details  Name: Shepard Generalykwan XXXHarper MRN: [id number] Date of Birth: [birth year]  Today's Date: 10/30/2013 Time: 4098-11911030-1115 Time Calculation (min): 45 min  Short Term Goals: Week 7:  OT Short Term Goal 1 (Week 7): Pt will demonstrate sustained attention to functional task for 3 min with mod A  OT Short Term Goal 2 (Week 7): Pt will  transfer bed<>w/c with supervision OT Short Term Goal 3 (Week 7): Pt don pants with steadying A  OT Short Term Goal 4 (Week 7): Pt would continently  void 15% of the day with timed toileting  Skilled Therapeutic Interventions/Progress Updates:    1:1 Engaged in orientation information (only oriented to his name), sustained attention to complete functional task of typing name on computer with max cuing for attention, following one step direction, toileting with continent episode of urine with steady A and max A for clothing management due to brief. Pt with increased verbal preservation today of wants of going somewhere but unable to say where or why; due to attention.   Therapy Documentation Precautions:  Precautions Precautions: Fall Precaution Comments:  L LE KI on during movement, ok to be off in bed and sitting in w/c Required Braces or Orthoses: Knee Immobilizer - Left Knee Immobilizer - Left: On when out of bed or walking Other Brace/Splint: Bil PRAFOs and L UE and LE splint can be removed for ROM with Ot/PT only.   Restrictions Weight Bearing Restrictions: Yes LUE Weight Bearing: Non weight bearing LLE Weight Bearing: Touchdown weight bearing LLE Partial Weight Bearing Percentage or Pounds: 50 Pain: Pain Assessment Pain Assessment: No/denies pain  See FIM for current functional status  Therapy/Group: Individual Therapy  Roney MansSmith, Lylianna Fraiser [facility] 10/30/2013, 4:00 PM
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
## 2013-10-30 NOTE — Progress Notes (Signed)
Physical Therapy Session Note  Patient Details  Name: Cory [name] MRN: [id number] Date of Birth: 04/19/1981  Today'[name] Date: 10/30/2013 Time: 1500-1530 Time Calculation (min): 30 min   Skilled Therapeutic Interventions/Progress Updates:  1:1. Pt received sitting in geri chair at nurses station. Focus this session on orientation as well as sustained attention to cognitive task in quiet gym environment. Pt req max verbal cues to attend to cards for matching task. Unable to match >3 sets of cards at a time, constantly transitioning between sitting EOM and supine. Pt demonstrating verbal perseveration throughout session and difficult to redirect. Pt only oriented to self and others this session, re-oriented to place, time and situation. Pt req mod A for toileting at end of session prior to transfer back to bed. Min A for all SPT tx mat<>chair<>bed and chair<>toilet. Pt supine in posey bed at end of session, RN aware.   Therapy Documentation Precautions:  Precautions Precautions: Fall Precaution Comments:  L LE KI on during movement, ok to be off in bed and sitting in w/c Required Braces or Orthoses: Knee Immobilizer - Left Knee Immobilizer - Left: On when out of bed or walking Other Brace/Splint: Bil PRAFOs and L UE and LE splint can be removed for ROM with Ot/PT only.   Restrictions Weight Bearing Restrictions: Yes LUE Weight Bearing: Non weight bearing LLE Weight Bearing: Touchdown weight bearing LLE Partial Weight Bearing Percentage or Pounds: 50 Pain: Pain Assessment Pain Assessment: No/denies pain  See FIM for current functional status  Therapy/Group: Individual Therapy  Cory [name], Cory [name] 10/30/2013, 3:49 PM

## 2013-10-30 NOTE — Progress Notes (Signed)
Recreational Therapy Session Note  Patient Details  Name: Cory [name] MRN: [id number] Date of Birth: [birth year] Today's Date: 10/30/2013  Pain: no c/o Skilled Therapeutic Interventions/Progress Updates: Session focused on activity tolerance, selective attention in mod distractive environment, problem solving, visual scanning,  BUE use for tabletop tasks including creating/copying patterns using various shapes & colors of wooden blocks with min-max cues.      Therapy/Group: Co-Treatment   Harley Fitzwater 10/30/2013, 2:56 PM

## 2013-10-30 NOTE — Progress Notes (Signed)
Speech Language Pathology Daily Session Note  Patient Details  Name: Cory [name] MRN: [id number] Date of Birth: [birth year]  Today's Date: 10/30/2013 Time: 1115-1200 Time Calculation (min): 45 min  Short Term Goals: Week 7: SLP Short Term Goal 1 (Week 7): Pt will consume trials of thin liquids without overt s/s of aspiration with Max A multimodal cueing SLP Short Term Goal 2 (Week 7): Pt will consume Dys. 2 textures with nectar-thick liquids with minimal overt s/s of aspiration with Max A verbal cues for utilization of small bites/sips.  SLP Short Term Goal 3 (Week 7): Pt will demonstrate efficient mastication of Dys. 3 textures without overt s/s of aspiration with Max A multimodal cueing  SLP Short Term Goal 4 (Week 7): Pt will follow 1 step commands with 90% of opportunities with Min A multimodal cueing  SLP Short Term Goal 5 (Week 7): pt will initiate functional tasks with 75% of opportunities with Mod A multimodal cueing  SLP Short Term Goal 6 (Week 7): Pt will demonstrate sustained attention during a functioanl task for 3 minutes with Max A verbal cues for redirection   Skilled Therapeutic Interventions: Skilled treatment focus on cognitive and dysphagia goals. SLP facilitated session by providing Max-Total multimodal cueing for focused attention to functional tasks due to pt's constant verbal perseveration of "please" and "come on." Pt participated in sorting task from field of 9 with supervision verbal and visual cues to self-monitor and correct errors.  Pt was oriented to place (hospital) and situation with Mod I but required Mod verbal cues for orientation to city. Pt consumed lunch meal of Dys. 2 textures with nectar-thick liquids without overt s/s of aspiration but required Mod verbal and tactile cues for utilization of swallowing compensatory strategies. Continue plan of care.    FIM:  Comprehension Comprehension Mode: Auditory Comprehension: 2-Understands basic 25 - 49% of the  time/requires cueing 51 - 75% of the time Expression Expression: 2-Expresses basic 25 - 49% of the time/requires cueing 50 - 75% of the time. Uses single words/gestures. Social Interaction Social Interaction: 2-Interacts appropriately 25 - 49% of time - Needs frequent redirection. Problem Solving Problem Solving: 1-Solves basic less than 25% of the time - needs direction nearly all the time or does not effectively solve problems and may need a restraint for safety Memory Memory: 1-Recognizes or recalls less than 25% of the time/requires cueing greater than 75% of the time FIM - Eating Eating Activity: 5: Needs verbal cues/supervision  Pain Pain Assessment Pain Assessment: No/denies pain  Therapy/Group: Individual Therapy  Cory [name] 10/30/2013, 12:18 PM

## 2013-10-31 ENCOUNTER — Inpatient Hospital Stay (HOSPITAL_COMMUNITY): Payer: Medicaid Other | Admitting: Speech Pathology

## 2013-10-31 ENCOUNTER — Inpatient Hospital Stay (HOSPITAL_COMMUNITY): Payer: Medicaid - Out of State

## 2013-10-31 ENCOUNTER — Inpatient Hospital Stay (HOSPITAL_COMMUNITY): Payer: Medicaid Other | Admitting: *Deleted

## 2013-10-31 ENCOUNTER — Encounter (HOSPITAL_COMMUNITY): Payer: Medicaid - Out of State | Admitting: Occupational Therapy

## 2013-10-31 DIAGNOSIS — S069XAA Unspecified intracranial injury with loss of consciousness status unknown, initial encounter: Secondary | ICD-10-CM

## 2013-10-31 DIAGNOSIS — S069X9A Unspecified intracranial injury with loss of consciousness of unspecified duration, initial encounter: Secondary | ICD-10-CM

## 2013-10-31 DIAGNOSIS — I69991 Dysphagia following unspecified cerebrovascular disease: Secondary | ICD-10-CM

## 2013-10-31 DIAGNOSIS — IMO0002 Reserved for concepts with insufficient information to code with codable children: Secondary | ICD-10-CM

## 2013-10-31 LAB — HEPATITIS C ANTIBODY (REFLEX): HCV AB: NEGATIVE

## 2013-10-31 LAB — HEPATITIS B SURFACE ANTIGEN: Hepatitis B Surface Ag: NEGATIVE

## 2013-10-31 LAB — HIV RAPID SCREEN (BLD OR BODY FLD EXPOSURE): Rapid HIV Screen: NONREACTIVE

## 2013-10-31 NOTE — Progress Notes (Signed)
Occupational Therapy Session Note  Patient Details  Name: Cory [name] MRN: [id number] Date of Birth: 1981-06-08  Today's Date: 10/31/2013 Time: [id number] Time Calculation (min): 55 min  Short Term Goals: Week 7:  OT Short Term Goal 1 (Week 7): Pt will demonstrate sustained attention to functional task for 3 min with mod A  OT Short Term Goal 2 (Week 7): Pt will  transfer bed<>w/c with supervision OT Short Term Goal 3 (Week 7): Pt don pants with steadying A  OT Short Term Goal 4 (Week 7): Pt would continently  void 15% of the day with timed toileting  Skilled Therapeutic Interventions/Progress Updates:    1:1 Pt in vail bed when arrived and pt wet from head to waist with urine and had been incontinent of bowel. Self care retraining at shower level with focus on following one step directions, sit to stands, functional use of right UE, following through with task until completion, simple problem solving, intellectual awareness and orientation (only of person). Pt continues to be verbally perseverative  (saying "please," "stop playing" " come on") but not able to really say his needs or wants. Pt demonstrating sustained attention with max cuing for 1 min. Pt performing transfers at min A level   Therapy Documentation Precautions:  Precautions Precautions: Fall Precaution Comments:  L LE KI on during movement, ok to be off in bed and sitting in w/c Required Braces or Orthoses: Knee Immobilizer - Left Knee Immobilizer - Left: On when out of bed or walking Other Brace/Splint: Bil PRAFOs and L UE and LE splint can be removed for ROM with Ot/PT only.   Restrictions Weight Bearing Restrictions: Yes LUE Weight Bearing: Non weight bearing LLE Weight Bearing: Touchdown weight bearing LLE Partial Weight Bearing Percentage or Pounds: 50 Pain:  no c/o pain   See FIM for current functional status  Therapy/Group: Individual Therapy  Roney MansSmith, [name] [name] 10/31/2013, 9:25 AM

## 2013-10-31 NOTE — Progress Notes (Signed)
Occupational Therapy Session Note  Patient Details  Name: Cory [name] General: [id number] Date of Birth: December 01, 1980  Today's Date: 10/31/2013 Time: 0930-1030 Time Calculation (min): 60 min  Short Term Goals: Week 7:  OT Short Term Goal 1 (Week 7): Pt will demonstrate sustained attention to functional task for 3 min with mod A  OT Short Term Goal 2 (Week 7): Pt will  transfer bed<>w/c with supervision OT Short Term Goal 3 (Week 7): Pt don pants with steadying A  OT Short Term Goal 4 (Week 7): Pt would continently  void 15% of the day with timed toileting  Skilled Therapeutic Interventions/Progress Updates:    Pt denied having to use toilet but performed stand pivot transfer to toilet with mod A.  Pt required max verbal cues to remain seated on toilet before standing to pull up pants.  Pt transitioned to ADL kitchen to assist with making popcorn.  Pt requested to turn handle on popcorn popper and initially refused to participate.  Pt engaged with max encouragement and demonstration for approx 15 seconds.  Pt agreeable to turning handle for approx 10-15 seconds and then therapist turned handle.  Pt followed directions to turn handle when it was his "turn." Focus on activity tolerance, following one step commands, task initiation, and attention to task.  Therapy Documentation Precautions:  Precautions Precautions: Fall Precaution Comments:  L LE KI on during movement, ok to be off in bed and sitting in w/c Required Braces or Orthoses: Knee Immobilizer - Left Knee Immobilizer - Left: On when out of bed or walking Other Brace/Splint: Bil PRAFOs and L UE and LE splint can be removed for ROM with Ot/PT only.   Restrictions Weight Bearing Restrictions: Yes LUE Weight Bearing: Non weight bearing LLE Weight Bearing: Touchdown weight bearing LLE Partial Weight Bearing Percentage or Pounds: 50   Pain: Pain Assessment Faces Pain Scale: Hurts even more Pain Location: Leg Pain Orientation:  Left Pain Intervention(s): RN aware; repositioned See FIM for current functional status  Therapy/Group: Individual Therapy  Rich BraveLanier, Kedra Mcglade Chappell 10/31/2013, 1:29 PM

## 2013-10-31 NOTE — Progress Notes (Signed)
Speech Language Pathology Daily Session Note  Patient Details  Name: Shepard Generalykwan XXXHarper MRN: [id number] Date of Birth: 06-Nov-1980  Today's Date: 10/31/2013 Time: 1115-1200 Time Calculation (min): 45 min  Short Term Goals: Week 7: SLP Short Term Goal 1 (Week 7): Pt will consume trials of thin liquids without overt s/s of aspiration with Max A multimodal cueing SLP Short Term Goal 2 (Week 7): Pt will consume Dys. 2 textures with nectar-thick liquids with minimal overt s/s of aspiration with Max A verbal cues for utilization of small bites/sips.  SLP Short Term Goal 3 (Week 7): Pt will demonstrate efficient mastication of Dys. 3 textures without overt s/s of aspiration with Max A multimodal cueing  SLP Short Term Goal 4 (Week 7): Pt will follow 1 step commands with 90% of opportunities with Min A multimodal cueing  SLP Short Term Goal 5 (Week 7): pt will initiate functional tasks with 75% of opportunities with Mod A multimodal cueing  SLP Short Term Goal 6 (Week 7): Pt will demonstrate sustained attention during a functioanl task for 3 minutes with Max A verbal cues for redirection   Skilled Therapeutic Interventions: Skilled treatment focus on cognitive and dysphagia goals. SLP facilitated session by providing Max multimodal cueing for focused attention to functional tasks due to pt's constant verbal perseveration. Pt's verbal perseveration appeared to decrease the more it was ignored and pt was redirected. Pt participated in sequencing task with 4 step picture cards with Mod A visual and question cues. Pt required total A to orient to place (hospital) and was oriented to situation with Mod I. Pt consumed lunch meal of Dys. 2 textures with nectar-thick liquids with an intermittent wet vocal quality that pt required total A to self-monitor and correct. Pt also required Mod-Max verbal and tactile cues for utilization of swallowing compensatory strategies. Pt transferred to enclosure bed to rest. Continue  plan of care.   FIM:  Comprehension Comprehension Mode: Auditory Comprehension: 2-Understands basic 25 - 49% of the time/requires cueing 51 - 75% of the time Expression Expression Mode: Verbal Expression: 2-Expresses basic 25 - 49% of the time/requires cueing 50 - 75% of the time. Uses single words/gestures. Social Interaction Social Interaction: 2-Interacts appropriately 25 - 49% of time - Needs frequent redirection. Problem Solving Problem Solving: 1-Solves basic less than 25% of the time - needs direction nearly all the time or does not effectively solve problems and may need a restraint for safety Memory Memory: 1-Recognizes or recalls less than 25% of the time/requires cueing greater than 75% of the time FIM - Eating Eating Activity: 5: Needs verbal cues/supervision  Pain Pain Assessment Pain Assessment: 0-10 Pain Score: Asleep Faces Pain Scale: Hurts even more Pain Location: Leg Pain Orientation: Left Pain Intervention(s): Medication (See eMAR)  Therapy/Group: Individual Therapy  [name] 10/31/2013, 2:36 PM

## 2013-10-31 NOTE — Progress Notes (Signed)
Subjective/Complaints: No problems reported overnight.  A  review of systems has been performed and if not noted above is otherwise negative. .  Objective: Vital Signs: Blood pressure 98/62, pulse 96, temperature 98.4 F (36.9 C), temperature source Oral, resp. rate 18, height 5' 2.99" (1.6 m), weight 43.8 kg (96 lb 9 oz), SpO2 98.00%. No results found. No results found for this basename: WBC, HGB, HCT, PLT,  in the last 72 hours No results found for this basename: NA, K, CL, CO, GLUCOSE, BUN, CREATININE, CALCIUM,  in the last 72 hours CBG (last 3)  No results found for this basename: GLUCAP,  in the last 72 hours  Wt Readings from Last 3 Encounters:  10/31/13 43.8 kg (96 lb 9 oz)  09/13/13 46.811 kg (103 lb 3.2 oz)  09/13/13 46.811 kg (103 lb 3.2 oz)    Physical Exam:  Constitutional:   Alert to restless, alternating, supine to sit as well as dual leg lifts, will not cooperate with MMT Eyes: Right eye exhibits no discharge. Left eye exhibits no discharge.  Neck:  Janina Mayo out , stoma closed Cardiovascular: Normal rate and regular rhythm.  Respiratory: Effort normal.  GI: Soft. Stoma with scab no drainageNeurological:    Awake. Remains distracted. Makes eye contacts and does speaks now. Musc:  Left arm flexes and extends.      Assessment/Plan: 1. 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 Physiatrist is providing close team supervision and 24 hour management of active medical problems listed below. Physiatrist and rehab team continue to assess barriers to discharge/monitor patient progress toward functional and medical goals.       FIM: FIM - Bathing Bathing Steps Patient Completed: Chest;Abdomen;Right upper leg;Left upper leg;Right Arm Bathing: 1: Two helpers  FIM - Upper Body Dressing/Undressing Upper body dressing/undressing steps patient completed:  Thread/unthread right sleeve of pullover shirt/dresss;Thread/unthread left sleeve of pullover shirt/dress;Put head through opening of pull over shirt/dress;Pull shirt over trunk Upper body dressing/undressing: 0: Wears gown/pajamas-no public clothing FIM - Lower Body Dressing/Undressing Lower body dressing/undressing steps patient completed: Thread/unthread right pants leg;Pull pants up/down;Don/Doff left sock;Don/Doff left shoe Lower body dressing/undressing: 0: Wears gown/pajamas-no public clothing  FIM - Toileting Toileting steps completed by patient: Adjust clothing prior to toileting Toileting Assistive Devices: Grab bar or rail for support Toileting: 1: Two helpers  FIM - Diplomatic Services operational officer Devices: Therapist, music Transfers: 3-To toilet/BSC: Mod A (lift or lower assist);3-From toilet/BSC: Mod A (lift or lower assist)  FIM - Bed/Chair Transfer Bed/Chair Transfer Assistive Devices: Arm rests Bed/Chair Transfer: 4: Bed > Chair or W/C: Min A (steadying Pt. > 75%);4: Chair or W/C > Bed: Min A (steadying Pt. > 75%);5: Supine > Sit: Supervision (verbal cues/safety issues)  FIM - Locomotion: Wheelchair Distance: 125 Locomotion: Wheelchair: 1: Total Assistance/staff pushes wheelchair (Pt<25%) FIM - Locomotion: Ambulation Locomotion: Ambulation Assistive Devices: MaxiSky;Fara Boros Ambulation/Gait Assistance: 1: +2 Total assist Locomotion: Ambulation: 1: Two helpers  Comprehension Comprehension Mode: Auditory Comprehension: 2-Understands basic 25 - 49% of the time/requires cueing 51 - 75% of the time  Expression Expression Mode: Verbal Expression: 2-Expresses basic 25 - 49% of the time/requires cueing 50 - 75% of the time. Uses single words/gestures.  Social Interaction Social Interaction: 2-Interacts appropriately 25 - 49% of time - Needs frequent redirection.  Problem Solving Problem Solving: 1-Solves basic less than 25% of the time - needs direction  nearly all the  time or does not effectively solve problems and may need a restraint for safety  Memory Memory: 1-Recognizes or recalls less than 25% of the time/requires cueing greater than 75% of the time  Medical Problem List and Plan:  1. DVT Prophylaxis/Anticoagulation: Pharmaceutical: Lovenox  2. Pain Management: Prn oxycodone for outward signs of pain.  3. Mood: Currently too low level to evaluate.  4. Neuropsych: This patient is not capable of making decisions on his own behalf.   -stopped amantadine  -some of agitation may be him acting out a little. As a whole he has shown some neurological improvement 5. Recurrent Fever: afebrile    -wounds improving 6. Left tibial plateau Fx--s/p ORIF: PWB in KI (not really practical). ---essentially will be non-weight bearing 7. Left humerus Fx--s/p ORIF: NWB- may be out of splint while in bed  -HO and MO surrounding injury site unchanged 8. Leucocytosis: improving 9. Left shin and heel wound: Continue bilateral PRAFO.  Marland Kitchen. Keflex initiated for coverage--dc 10. ABLA: stable 11. Dysphagia: Continue TF for supplement. Continue D2, honey liquids with assistance.  Good intake 12. Agitation: seroquel- decreased to hs only, continue inderal and zoloft     -ativan prn.   -would like to move him to a hi-lo bed 13. FEN: good po appreciate dietician notes LOS (Days) 48 A FACE TO FACE EVALUATION WAS PERFORMED  Deniya Craigo T 10/31/2013 8:09 AM

## 2013-10-31 NOTE — Progress Notes (Signed)
Physical Therapy Session Note  Patient Details  Name: Cory [name] MRN: [id number] Date of Birth: 1981-06-19  Today's Date: 10/31/2013 Time: 0930-1030 and 1345-1440 Time Calculation (min): 60 min and 55 min  Short Term Goals: Week 5:  PT Short Term Goal 1 (Week 5): Patient will follow one step commands with transfers with min verbal cues and minA. PT Short Term Goal 2 (Week 5): Patient will assist with transfers 75% of the time with minA. PT Short Term Goal 3 (Week 5): Patient will demonstrate sustained attention for entireity of each trial of gait training x20'. PT Short Term Goal 4 (Week 5): Patient will demonstrate sustained attention to therapeutic activity x30" with mod verbal cues.  Skilled Therapeutic Interventions/Progress Updates:    AM Session: Inconsistent c/o pain in leg. Patient received sitting in [place] at [employer] station. Session focused on following one step commands during functional mobility and sustained attention to functional mobility/therapeutic activities. L LE KI donned for gait training. Patient performed gait training with use of MaxiSky 2664' x2 with Fara BorosEva Walker (R hand uses handle to grip, but therapist assists with maintaining NWB through R UE) and 32' x1 with patient's B UE on helper's shoulders to increase upright posture. Noted improved L step length, improved pacing overall; improved upright posture with use of B UEs on helper's shoulders. Patient able to sustain attention and follow simple commands during gait training with mod cues. Seated, patient performed sorting task from field of 2 with max cues to sustain attention until task completion. Patient left seated in [place] at [employer] station.  PM Session: Patient received supine in bed. Inconsistent c/o pain in leg. Session focused on attempted timed toileting trial (patient incontinent), following one step commands during functional mobility, and sustained attention to functional mobility/therapeutic activities.  Emphasis on bimanual integration with removing cap of pen, writing name, using R hand to sort cards, watering pants, and cleaning up spilled water with paper towels. Patient left seated in [place] at [employer] station.  Therapy Documentation Precautions:  Precautions Precautions: Fall Precaution Comments:  L LE KI on during movement, ok to be off in bed and sitting in w/c Required Braces or Orthoses: Knee Immobilizer - Left Knee Immobilizer - Left: On when out of bed or walking Restrictions Weight Bearing Restrictions: Yes LUE Weight Bearing: Non weight bearing LLE Weight Bearing: Touchdown weight bearing LLE Partial Weight Bearing Percentage or Pounds: 50 Locomotion : Ambulation Ambulation/Gait Assistance: 1: +2 Total assist   See FIM for current functional status  Therapy/Group: Individual Therapy  Chipper HerbBridget S Palyn Scrima S. Deno Sida, PT, DPT 10/31/2013, 11:31 AM

## 2013-11-01 ENCOUNTER — Inpatient Hospital Stay (HOSPITAL_COMMUNITY): Payer: Medicaid Other | Admitting: Occupational Therapy

## 2013-11-01 ENCOUNTER — Inpatient Hospital Stay (HOSPITAL_COMMUNITY): Payer: Medicaid Other | Admitting: Speech Pathology

## 2013-11-01 ENCOUNTER — Inpatient Hospital Stay (HOSPITAL_COMMUNITY): Payer: Medicaid Other | Admitting: *Deleted

## 2013-11-01 DIAGNOSIS — IMO0002 Reserved for concepts with insufficient information to code with codable children: Secondary | ICD-10-CM

## 2013-11-01 DIAGNOSIS — S069X9A Unspecified intracranial injury with loss of consciousness of unspecified duration, initial encounter: Secondary | ICD-10-CM

## 2013-11-01 DIAGNOSIS — S069XAA Unspecified intracranial injury with loss of consciousness status unknown, initial encounter: Secondary | ICD-10-CM

## 2013-11-01 DIAGNOSIS — I69991 Dysphagia following unspecified cerebrovascular disease: Secondary | ICD-10-CM

## 2013-11-01 MED ORDER — DICLOFENAC SODIUM 1 % TD GEL
2.0000 g | Freq: Three times a day (TID) | TRANSDERMAL | Status: DC
  Administered 2013-11-01 – 2013-11-08 (×21): 2 g via TOPICAL
  Filled 2013-11-01: qty 100

## 2013-11-01 NOTE — Progress Notes (Signed)
Speech Language Pathology Daily Session Note  Patient Details  Name: Cory [name] MRN: [id number] Date of Birth: Sep 09, 1981  Today's Date: 11/01/2013 Time: 1115-1200 Time Calculation (min): 45 min  Short Term Goals: Week 7: SLP Short Term Goal 1 (Week 7): Pt will consume trials of thin liquids without overt s/s of aspiration with Max A multimodal cueing SLP Short Term Goal 2 (Week 7): Pt will consume Dys. 2 textures with nectar-thick liquids with minimal overt s/s of aspiration with Max A verbal cues for utilization of small bites/sips.  SLP Short Term Goal 3 (Week 7): Pt will demonstrate efficient mastication of Dys. 3 textures without overt s/s of aspiration with Max A multimodal cueing  SLP Short Term Goal 4 (Week 7): Pt will follow 1 step commands with 90% of opportunities with Min A multimodal cueing  SLP Short Term Goal 5 (Week 7): pt will initiate functional tasks with 75% of opportunities with Mod A multimodal cueing  SLP Short Term Goal 6 (Week 7): Pt will demonstrate sustained attention during a functioanl task for 3 minutes with Max A verbal cues for redirection   Skilled Therapeutic Interventions: Skilled treatment focus on cognitive and dysphagia goals. SLP facilitated session by providing Max multimodal cueing for focused attention to functional tasks due to pt's constant verbal perseveration. Pt required total A to orient to place (hospital) and situation. Pt consumed lunch meal of Dys. 2 textures with nectar-thick liquids with an intermittent wet vocal quality that pt required total A to self-monitor and correct. Pt also required Mod-Max verbal and tactile cues for utilization of swallowing compensatory strategies. Continue plan of care.   FIM:  Comprehension Comprehension Mode: Auditory Comprehension: 2-Understands basic 25 - 49% of the time/requires cueing 51 - 75% of the time Expression Expression Mode: Verbal Expression: 2-Expresses basic 25 - 49% of the time/requires  cueing 50 - 75% of the time. Uses single words/gestures. Social Interaction Social Interaction: 2-Interacts appropriately 25 - 49% of time - Needs frequent redirection. Problem Solving Problem Solving: 1-Solves basic less than 25% of the time - needs direction nearly all the time or does not effectively solve problems and may need a restraint for safety Memory Memory: 1-Recognizes or recalls less than 25% of the time/requires cueing greater than 75% of the time FIM - Eating Eating Activity: 5: Needs verbal cues/supervision  Pain No/Denies Pain  Therapy/Group: Individual Therapy  Yaiden Yang 11/01/2013, 5:10 PM
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
## 2013-11-01 NOTE — Progress Notes (Signed)
Subjective/Complaints: No problems reported overnight again. Up with therapy this am and interactive  A  review of systems has been performed and if not noted above is otherwise negative. .  Objective: Vital Signs: Blood pressure 109/63, pulse 84, temperature 97.9 F (36.6 C), temperature source Oral, resp. rate 18, height 5' 2.99" (1.6 m), weight 45.5 kg (100 lb 5 oz), SpO2 97.00%. No results found. No results found for this basename: WBC, HGB, HCT, PLT,  in the last 72 hours No results found for this basename: NA, K, CL, CO, GLUCOSE, BUN, CREATININE, CALCIUM,  in the last 72 hours CBG (last 3)  No results found for this basename: GLUCAP,  in the last 72 hours  Wt Readings from Last 3 Encounters:  11/01/13 45.5 kg (100 lb 5 oz)  09/13/13 46.811 kg (103 lb 3.2 oz)  09/13/13 46.811 kg (103 lb 3.2 oz)    Physical Exam:  Constitutional:   Alert to restless, alternating, supine to sit as well as dual leg lifts, will not cooperate with MMT Eyes: Right eye exhibits no discharge. Left eye exhibits no discharge.  Neck:  [name] , stoma closed Cardiovascular: Normal rate and regular rhythm.  Respiratory: Effort normal.  GI: Soft. Stoma with scab no drainageNeurological:    Awake. Remains distracted. Makes eye contacts and does speaks now. Musc:  Left arm flexes and extends.      Assessment/Plan: 1. 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 Physiatrist is providing close team supervision and 24 hour management of active medical problems listed below. Physiatrist and rehab team continue to assess barriers to discharge/monitor patient progress toward functional and medical goals.       FIM: FIM - Bathing Bathing Steps Patient Completed: Chest;Abdomen;Right upper leg;Left upper leg;Right Arm Bathing: 1: Two helpers  FIM - Upper Body Dressing/Undressing Upper  body dressing/undressing steps patient completed: Thread/unthread right sleeve of pullover shirt/dresss;Thread/unthread left sleeve of pullover shirt/dress;Put head through opening of pull over shirt/dress;Pull shirt over trunk Upper body dressing/undressing: 0: Wears gown/pajamas-no public clothing FIM - Lower Body Dressing/Undressing Lower body dressing/undressing steps patient completed: Thread/unthread right pants leg;Pull pants up/down;Don/Doff left sock;Don/Doff left shoe Lower body dressing/undressing: 0: Wears gown/pajamas-no public clothing  FIM - Toileting Toileting steps completed by patient: Adjust clothing prior to toileting Toileting Assistive Devices: Grab bar or rail for support Toileting: 1: Two helpers  FIM - Diplomatic Services operational officerToilet Transfers Toilet Transfers Assistive Devices: Therapist, musicGrab bars Toilet Transfers: 3-To toilet/BSC: Mod A (lift or lower assist);3-From toilet/BSC: Mod A (lift or lower assist)  FIM - Bed/Chair Transfer Bed/Chair Transfer Assistive Devices: Arm rests Bed/Chair Transfer: 4: Bed > Chair or W/C: Min A (steadying Pt. > 75%);4: Chair or W/C > Bed: Min A (steadying Pt. > 75%);5: Supine > Sit: Supervision (verbal cues/safety issues);5: Sit > Supine: Supervision (verbal cues/safety issues)  FIM - Locomotion: Wheelchair Distance: 125 Locomotion: Wheelchair: 1: Total Assistance/staff pushes wheelchair (Pt<25%) FIM - Locomotion: Ambulation Locomotion: Ambulation Assistive Devices: MaxiSky;Fara BorosWalker - Eva Ambulation/Gait Assistance: 1: +2 Total assist Locomotion: Ambulation: 1: Two helpers  Comprehension Comprehension Mode: Auditory Comprehension: 2-Understands basic 25 - 49% of the time/requires cueing 51 - 75% of the time  Expression Expression Mode: Verbal Expression: 2-Expresses basic 25 - 49% of the time/requires cueing 50 - 75% of the time. Uses single words/gestures.  Social Interaction Social Interaction: 2-Interacts appropriately 25 - 49% of time - Needs frequent  redirection.  Problem Solving Problem  Solving: 1-Solves basic less than 25% of the time - needs direction nearly all the time or does not effectively solve problems and may need a restraint for safety  Memory Memory: 1-Recognizes or recalls less than 25% of the time/requires cueing greater than 75% of the time  Medical Problem List and Plan:  1. DVT Prophylaxis/Anticoagulation: Pharmaceutical: Lovenox  2. Pain Management: Prn oxycodone for outward signs of pain.  3. Mood: Currently too low level to evaluate.  4. Neuropsych: This patient is not capable of making decisions on his own behalf.   -stopped amantadine  -some of agitation may be him acting out a little. As a whole he has shown some neurological improvement 5. Recurrent Fever: afebrile    -wounds improving 6. Left tibial plateau Fx--s/p ORIF: PWB in KI (not really practical). ---essentially will be non-weight bearing 7. Left humerus Fx--s/p ORIF: NWB- may be out of splint while in bed  -HO and MO surrounding injury site unchanged 8. Leucocytosis: improving 9. Left shin and heel wound: Continue bilateral PRAFO.   Keflex initiated for coverage--dc 10. ABLA: stable 11. Dysphagia: Continue TF for supplement. Continue D2, nectars liquids with assistance.   12. Agitation: seroquel- decreased to hs only, continue inderal and zoloft     -ativan prn.   -would like to move him to a hi-lo bed soon 13. FEN: good po appreciate dietician notes LOS (Days) 49 A FACE TO FACE EVALUATION WAS PERFORMED  SWARTZ,Cory [name] 11/01/2013 8:36 AM
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
## 2013-11-01 NOTE — Progress Notes (Signed)
Social Work Patient ID: Cory [name] XXXHarper, male   DOB: 1981-01-19, 33 y.o.   MRN: [id number]   Continue to work on SNF.  Have spoken with financial counselor who reports pt may have active/ approved Medicaid by end of week which could make placement much easier and more likelihood of local facility.  Continue to follow.  Osiris Odriscoll, LCSW

## 2013-11-01 NOTE — Progress Notes (Signed)
Physical Therapy Session Note  Patient Details  Name: Cory [name] MRN: [id number] Date of Birth: 12-Jun-1981  Today's Date: 11/01/2013 Time: [id number] and [id number] Time Calculation (min): 60 min and 45 min  Short Term Goals: Week 5:  PT Short Term Goal 1 (Week 5): Patient will follow one step commands with transfers with min verbal cues and minA. PT Short Term Goal 2 (Week 5): Patient will assist with transfers 75% of the time with minA. PT Short Term Goal 3 (Week 5): Patient will demonstrate sustained attention for entireity of each trial of gait training x20'. PT Short Term Goal 4 (Week 5): Patient will demonstrate sustained attention to therapeutic activity x30" with mod verbal cues.  Skilled Therapeutic Interventions/Progress Updates:    AM Session: Patient received sitting in [place] from OT. Session focused on cognitive remediation with emphasis on orientation, sorting colors from field of 3 (max cues for sustained attention, min cues for accuracy), and verbalization of object recognition and usage with functional/daily objects (utensils, ball, cup, plate, bowl, comb). Patient able to state what object is used for approx 50% of the time, but requires max cues to properly name object. Patient continues to demonstrate verbal perseveration and poor frustration tolerance. Patient left seated in [place] at [employer] station.  PM Session: Patient received sitting in [place] at [employer] station. Attempted timed toileting, patient already incontinent of bladder. Patient doffed shirt and sweatshirt with min cues, transferred wheelchair<>toilet with min A. Patient unable to void. Patient donned new shirt and able properly orient shirt without cues. Remainder of session focused on cognitive remediation with sustained attention during color matching task (able to match colored blocks on corresponding spaces with min cues to correct errors during field of 9), and picture recognition (sun, moon, star) and  verbalization of proper time of day these pictures are present; patient requires max A for accuracy and sustained attention.  Therapy Documentation Precautions:  Precautions Precautions: Fall Precaution Comments:  L LE KI on during movement, ok to be off in bed and sitting in w/c Required Braces or Orthoses: Knee Immobilizer - Left Knee Immobilizer - Left: On when out of bed or walking Restrictions Weight Bearing Restrictions: Yes LUE Weight Bearing: Non weight bearing LLE Weight Bearing: Touchdown weight bearing LLE Partial Weight Bearing Percentage or Pounds: 50  See FIM for current functional status  Therapy/Group: Individual Therapy  Chipper HerbBridget S Elbert Polyakov S. [name], PT, DPT 11/01/2013, 9:54 AM

## 2013-11-02 ENCOUNTER — Inpatient Hospital Stay (HOSPITAL_COMMUNITY): Payer: Medicaid Other | Admitting: *Deleted

## 2013-11-02 ENCOUNTER — Inpatient Hospital Stay (HOSPITAL_COMMUNITY): Payer: Medicaid Other

## 2013-11-02 ENCOUNTER — Inpatient Hospital Stay (HOSPITAL_COMMUNITY): Payer: Medicaid - Out of State | Admitting: *Deleted

## 2013-11-02 ENCOUNTER — Inpatient Hospital Stay (HOSPITAL_COMMUNITY): Payer: Medicaid Other | Admitting: Occupational Therapy

## 2013-11-02 ENCOUNTER — Inpatient Hospital Stay (HOSPITAL_COMMUNITY): Payer: Medicaid Other | Admitting: Speech Pathology

## 2013-11-02 NOTE — Progress Notes (Signed)
Subjective/Complaints: Quiet evening.  A  review of systems has been performed and if not noted above is otherwise negative. .  Objective: Vital Signs: Blood pressure 109/63, pulse 83, temperature 97.7 F (36.5 C), temperature source Oral, resp. rate 18, height 5' 2.99" (1.6 m), weight 45.5 kg (100 lb 5 oz), SpO2 97.00%. No results found. No results found for this basename: WBC, HGB, HCT, PLT,  in the last 72 hours No results found for this basename: NA, K, CL, CO, GLUCOSE, BUN, CREATININE, CALCIUM,  in the last 72 hours CBG (last 3)  No results found for this basename: GLUCAP,  in the last 72 hours  Wt Readings from Last 3 Encounters:  11/01/13 45.5 kg (100 lb 5 oz)  09/13/13 46.811 kg (103 lb 3.2 oz)  09/13/13 46.811 kg (103 lb 3.2 oz)    Physical Exam:  Constitutional:   Alert to restless, alternating, supine to sit as well as dual leg lifts, will not cooperate with MMT Eyes: Right eye exhibits no discharge. Left eye exhibits no discharge.  Neck:  Janina Mayo out , stoma closed Cardiovascular: Normal rate and regular rhythm.  Respiratory: Effort normal.  GI: Soft. Stoma with scab no drainageNeurological:    Awake. Remains distracted. Makes eye contacts and does speaks now. Musc:  Left arm flexes and extends.      Assessment/Plan: 1. 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 Physiatrist is providing close team supervision and 24 hour management of active medical problems listed below. Physiatrist and rehab team continue to assess barriers to discharge/monitor patient progress toward functional and medical goals.       FIM: FIM - Bathing Bathing Steps Patient Completed: Chest;Abdomen;Right upper leg;Left upper leg;Right Arm Bathing: 1: Two helpers  FIM - Upper Body Dressing/Undressing Upper body dressing/undressing steps patient completed: Thread/unthread  right sleeve of pullover shirt/dresss;Thread/unthread left sleeve of pullover shirt/dress;Put head through opening of pull over shirt/dress;Pull shirt over trunk Upper body dressing/undressing: 5: Set-up assist to: Obtain clothing/put away FIM - Lower Body Dressing/Undressing Lower body dressing/undressing steps patient completed: Thread/unthread right pants leg;Pull pants up/down;Don/Doff left sock;Don/Doff left shoe Lower body dressing/undressing: 1: Total-Patient completed less than 25% of tasks  FIM - Toileting Toileting steps completed by patient: Adjust clothing prior to toileting Toileting Assistive Devices: Grab bar or rail for support Toileting: 1: Total-Patient completed zero steps, helper did all 3  FIM - Diplomatic Services operational officer Devices: Grab bars Toilet Transfers: 4-To toilet/BSC: Min A (steadying Pt. > 75%);4-From toilet/BSC: Min A (steadying Pt. > 75%)  FIM - Bed/Chair Transfer Bed/Chair Transfer Assistive Devices: Arm rests Bed/Chair Transfer: 5: Bed > Chair or W/C: Supervision (verbal cues/safety issues);5: Chair or W/C > Bed: Supervision (verbal cues/safety issues);5: Sit > Supine: Supervision (verbal cues/safety issues)  FIM - Locomotion: Wheelchair Distance: 125 Locomotion: Wheelchair: 1: Total Assistance/staff pushes wheelchair (Pt<25%) FIM - Locomotion: Ambulation Locomotion: Ambulation Assistive Devices: MaxiSky;Fara Boros Ambulation/Gait Assistance: 1: +2 Total assist Locomotion: Ambulation: 0: Activity did not occur  Comprehension Comprehension Mode: Auditory Comprehension: 2-Understands basic 25 - 49% of the time/requires cueing 51 - 75% of the time  Expression Expression Mode: Verbal Expression: 2-Expresses basic 25 - 49% of the time/requires cueing 50 - 75% of the time. Uses single words/gestures.  Social Interaction Social Interaction: 2-Interacts appropriately 25 - 49% of time - Needs frequent redirection.  Problem  Solving Problem Solving: 1-Solves basic less than 25% of the  time - needs direction nearly all the time or does not effectively solve problems and may need a restraint for safety  Memory Memory: 1-Recognizes or recalls less than 25% of the time/requires cueing greater than 75% of the time  Medical Problem List and Plan:  1. DVT Prophylaxis/Anticoagulation: Pharmaceutical: Lovenox  2. Pain Management: Prn oxycodone for outward signs of pain.  3. Mood: Currently too low level to evaluate.  4. Neuropsych: This patient is not capable of making decisions on his own behalf.   -stopped amantadine  -some of agitation may be him acting out a little. As a whole he has shown some neurological improvement 5. Recurrent Fever: afebrile    -wounds improving 6. Left tibial plateau Fx--s/p ORIF: PWB in KI (not really practical). ---essentially will be non-weight bearing 7. Left humerus Fx--s/p ORIF: NWB- may be out of splint while in bed  -HO and MO surrounding injury site unchanged 8. Leucocytosis: improving 9. Left shin and heel wound: Continue bilateral PRAFO.   Keflex initiated for coverage--dc 10. ABLA: stable 11. Dysphagia: Continue TF for supplement. Continue D2, nectars liquids with assistance.   12. Agitation: seroquel- decreased to hs only, continue inderal and zoloft     -ativan prn.   -would like to move him to a hi-lo bed soon 13. FEN: good po appreciate dietician notes LOS (Days) 50 A FACE TO FACE EVALUATION WAS PERFORMED  SWARTZ,ZACHARY T 11/02/2013 8:34 AM

## 2013-11-02 NOTE — Progress Notes (Addendum)
Physical Therapy Weekly Progress Note  Patient Details  Name: Cory [name] MRN: [id number] Date of Birth: 10-22-1981  Today's Date: 11/02/2013 Time: 8338-2505 and missed 45 min in PM Time Calculation (min): 58 min and missed 45 min in PM  Patient has met 2 of 4 short term goals.  Patient has made good progress since last weekly note. Patient is now verbal with simple commands, words, and simple sentences. Patient is oriented to himself and 50% the hospital. Patient continues to be incontient of urine and bowel with no awareness. Patient is able to transfer with min A. Patient requires max to total A to attend to a task longer than 1 minute. Overall, patient demonstrates increased ability and consistency to sustain attention to functional tasks, following simple/one step commands, verbalizing wants/needs. Patient still requires total A +2 for gait training, prolonged static standing, and stair negotiation secondary to poor sustained attention, impulsive movements, and L LE weightbearing precautions. Patient's behavior is consistent with [place] Level V.  Patient continues to demonstrate the following deficits: muscle weakness, decreased cardiorespiratoy endurance, impaired timing and sequencing, abnormal tone, unbalanced muscle activation, motor apraxia, decreased motor planning, decreased visual perceptual skills and decreased visual motor skills, decreased midline orientation, decreased attention to right, decreased initiation, decreased problem solving, decreased safety awareness, decreased memory and delayed processing and decreased sitting balance, decreased standing balance, hemiplegia, decreased balance strategies  and therefore will continue to benefit from skilled PT intervention to enhance overall performance with activity tolerance, balance, postural control, ability to compensate for deficits, functional use of  right upper extremity, right lower extremity, left upper extremity and left lower  extremity, attention, awareness, coordination and knowledge of precautions.  Patient progressing toward long term goals..  Plan of care revisions: Added wheelchair mobility, gait, and stair goals for controlled environments; modA wheelchair mobility, maxA gait and stairs.  PT Short Term Goals Week 1:  PT Short Term Goal 1 (Week 1): Pt will participate 10% with functional transfer PT Short Term Goal 1 - Progress (Week 1): Not met PT Short Term Goal 2 (Week 1): Pt will demo focused attention to functional activity  x 10 seconds with max A PT Short Term Goal 2 - Progress (Week 1): Not met PT Short Term Goal 3 (Week 1): Pt will maintain static seated balance for therapeutic activity x 30 seconds with pt assisting 25% PT Short Term Goal 3 - Progress (Week 1): Not met Week 2:  PT Short Term Goal 1 (Week 2): Patient will demonstrate a localized response in right field with audiotory stimulus 75% of the time with max cuing PT Short Term Goal 1 - Progress (Week 2): Met PT Short Term Goal 2 (Week 2): Pt will demonstrate focused attention for 3 seconds with max cuing for in preparation for bed<>wheelchair transfer. PT Short Term Goal 2 - Progress (Week 2): Met PT Short Term Goal 3 (Week 2): Pt will visually track a functional object 50% of opportunities with max cuing PT Short Term Goal 3 - Progress (Week 2): Met Week 3:  PT Short Term Goal 1 (Week 3): Patient will demonstrate sustained attention x 20" in a minimally distractive environment. PT Short Term Goal 1 - Progress (Week 3): Met PT Short Term Goal 2 (Week 3): Patient will demonstrate functional object use while sitting edge of bed on 2 of 3 occasions. PT Short Term Goal 2 - Progress (Week 3): Met PT Short Term Goal 3 (Week 3): Patient will follow one step commands 3/3 trials  while sitting edge of bed. PT Short Term Goal 3 - Progress (Week 3): Met Week 4:  PT Short Term Goal 1 (Week 4): Patient will follow simple one step commands with functional  mobility with modA. PT Short Term Goal 1 - Progress (Week 4): Met PT Short Term Goal 2 (Week 4): Patient will assist with transfers 50% of the time with modA. PT Short Term Goal 2 - Progress (Week 4): Met PT Short Term Goal 3 (Week 4): Patient will demonstrate sustained attention to gait training x1 min with modA. PT Short Term Goal 3 - Progress (Week 4): Progressing toward goal Week 5:  PT Short Term Goal 1 (Week 5): Patient will follow one step commands with transfers with min verbal cues and minA. PT Short Term Goal 1 - Progress (Week 5): Met PT Short Term Goal 2 (Week 5): Patient will assist with transfers 75% of the time with minA. PT Short Term Goal 2 - Progress (Week 5): Met PT Short Term Goal 3 (Week 5): Patient will demonstrate sustained attention for entireity of each trial of gait training x20'. PT Short Term Goal 3 - Progress (Week 5): Progressing toward goal PT Short Term Goal 4 (Week 5): Patient will demonstrate sustained attention to therapeutic activity x30" with mod verbal cues. PT Short Term Goal 4 - Progress (Week 5): Progressing toward goal Week 6:  PT Short Term Goal 1 (Week 6): Patient will demonstrate sustained attention to therapeutic activity x30" with mod verbal cues. PT Short Term Goal 2 (Week 6): Patient will demonstrate sustained attention for entireity of each trial of gait training x20'. PT Short Term Goal 3 (Week 6): Patient will follow one step commands with stair negotiation with mod verbal cues and maxA.  Skilled Therapeutic Interventions/Progress Updates:    AM Session: Patient received supine in bed. Session focused on timed toileting, functional ambulation, wheelchair mobility, and cognitive remediation with emphasis on orientation, sequencing, and problem-solving. Patient continent of bladder during timed toileting session. Wheelchair mobility [phone]' with minA with B LE, attempted use with UEs, but patient unable to sustain attention to task. L LE KI donned  for gait training. Functional ambulation 24' x4 wheelchair<>chair to facilitate target for patient to ambulate to; maxA x1. Patient oriented to self, place, and month. Re-oriented to situation, age, birthday. Color matching task (able to match 9 colored blocks on corresponding spaces x 2 trials with min cues to correct errors in field of 21). Patient transferred to [facility] and handed off to SLP for lunch therapy.  PM Session: Patient missed 45 minutes of physical therapy this PM secondary to fatigue. RN reported patient with poor sleep last night and patient resisting getting out of bed. Patient left in enclosure bed, RN aware of patient status.  Therapy Documentation Precautions:  Precautions Precautions: Fall Precaution Comments:  L LE KI on during movement, ok to be off in bed and sitting in w/c Required Braces or Orthoses: Knee Immobilizer - Left Knee Immobilizer - Left: On when out of bed or walking Restrictions Weight Bearing Restrictions: Yes LUE Weight Bearing: Non weight bearing LLE Weight Bearing: Touchdown weight bearing LLE Partial Weight Bearing Percentage or Pounds: 50 Locomotion : Ambulation Ambulation/Gait Assistance: 2: Max Financial controller Distance: 125   See FIM for current functional status  Therapy/Group: Individual Therapy  Lillia Abed. Oliviana Mcgahee, PT, DPT 11/02/2013, 12:13 PM
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
## 2013-11-02 NOTE — Progress Notes (Signed)
Speech Language Pathology Daily Session Note  Patient Details  Name: Cory [name] MRN: [id number] Date of Birth: [birth year]  Today's Date: 11/02/2013 Time: 1130-1210 Time Calculation (min): 40 min  Short Term Goals: Week 7: SLP Short Term Goal 1 (Week 7): Pt will consume trials of thin liquids without overt s/s of aspiration with Max A multimodal cueing SLP Short Term Goal 2 (Week 7): Pt will consume Dys. 2 textures with nectar-thick liquids with minimal overt s/s of aspiration with Max A verbal cues for utilization of small bites/sips.  SLP Short Term Goal 3 (Week 7): Pt will demonstrate efficient mastication of Dys. 3 textures without overt s/s of aspiration with Max A multimodal cueing  SLP Short Term Goal 4 (Week 7): Pt will follow 1 step commands with 90% of opportunities with Min A multimodal cueing  SLP Short Term Goal 5 (Week 7): pt will initiate functional tasks with 75% of opportunities with Mod A multimodal cueing  SLP Short Term Goal 6 (Week 7): Pt will demonstrate sustained attention during a functioanl task for 3 minutes with Max A verbal cues for redirection   Skilled Therapeutic Interventions: Skilled treatment focus on cognitive and dysphagia goals. SLP facilitated session by providing Max multimodal cueing for focused attention to functional tasks due to pt's constant verbal perseveration. Pt required total A to orient to place (hospital) and situation. Pt consumed upgraded lunch meal of Dys. 3 textures with thin liquids with an intermittent wet vocal quality that required total A to self-monitor and correct. Pt also required Mod-Max verbal and tactile cues for utilization of swallowing compensatory strategies of small bites/sips and a slow rate. Continue plan of care.   FIM:  Comprehension Comprehension Mode: Auditory Comprehension: 2-Understands basic 25 - 49% of the time/requires cueing 51 - 75% of the time Expression Expression Mode: Verbal Expression: 2-Expresses  basic 25 - 49% of the time/requires cueing 50 - 75% of the time. Uses single words/gestures. Social Interaction Social Interaction: 2-Interacts appropriately 25 - 49% of time - Needs frequent redirection. Problem Solving Problem Solving: 1-Solves basic less than 25% of the time - needs direction nearly all the time or does not effectively solve problems and may need a restraint for safety Memory Memory: 1-Recognizes or recalls less than 25% of the time/requires cueing greater than 75% of the time FIM - Eating Eating Activity: 5: Needs verbal cues/supervision  Pain Pain Assessment Pain Assessment: No/denies pain  Therapy/Group: Individual Therapy  Blanche Scovell 11/02/2013, 12:17 PM

## 2013-11-02 NOTE — Progress Notes (Signed)
Occupational Therapy Weekly Progress Note  Patient Details  Name: Cory [name] MRN: [id number] Date of Birth: 1981-07-30  Today's Date: 11/02/2013 Time: 0812-0900 Time Calculation (min): 48 min [place] with SLP 9:00-9:30  Rn reported pt with poor sleep last night so allowed pt to sleep 730-815 (missing 45 min of therapy today) to allow for better participation during scheduled MBS today.   Engaged in bed mobility and basic transfers bed<w/c<>toilet, w/c<geri chair with close supervision. Continued focus on sustained attention during functional tasks, following directions/ commands to complete simple grooming and dressing tasks. Pt continues to demonstrate preservative language but can be easily redirected to purposeful task with extra time however needs to be done frequently - pt continues to demonstrate short term memory deficits. Timed toileting successful for continent void.  Co treat with SLP for transport, transfers and positioning in radiology suite.    Weekly Note Patient has met 3 of 4 short term goals.  Pt continues to make slow steady progress. Pt continues to demonstrate behaviors consistent with Rancho Level V. Pt can don clothing with min A and bathe with mod A at shower level. Pt continues to be preservative and requires frequent redirection. Pt can transfer with close supervision. Continue to strived for timed toileting every 2-3 hrs to decr incontinence.   Patient continues to demonstrate the following deficits: muscle weakness, decreased cardiorespiratoy endurance, impaired timing and sequencing, decreased coordination and decreased motor planning, field cut, decreased attention to right, decreased attention, decreased awareness, decreased problem solving, decreased safety awareness and decreased memory and decreased standing balance, hemiplegia, decreased balance strategies and difficulty maintaining precautions and therefore will continue to benefit from skilled OT intervention  to enhance overall performance with BADL and Reduce care partner burden.  Patient progressing toward long term goals..  Continue plan of care.  OT Short Term Goals Week 7:  OT Short Term Goal 1 (Week 7): Pt will demonstrate sustained attention to functional task for 3 min with mod A  OT Short Term Goal 1 - Progress (Week 7): Progressing toward goal OT Short Term Goal 2 (Week 7): Pt will  transfer bed<>w/c with supervision OT Short Term Goal 2 - Progress (Week 7): Met OT Short Term Goal 3 (Week 7): Pt don pants with steadying A  OT Short Term Goal 3 - Progress (Week 7): Met OT Short Term Goal 4 (Week 7): Pt would continently  void 15% of the day with timed toileting OT Short Term Goal 4 - Progress (Week 7): Met  Week 8: STG=LTG  Skilled Therapeutic Interventions/Progress Updates:    continue with POC  Therapy Documentation Precautions:  Precautions Precautions: Fall Precaution Comments:  L LE KI on during movement, ok to be off in bed and sitting in w/c Required Braces or Orthoses: Knee Immobilizer - Left Knee Immobilizer - Left: On when out of bed or walking Other Brace/Splint: Bil PRAFOs and L UE and LE splint can be removed for ROM with Ot/PT only.   Restrictions Weight Bearing Restrictions: Yes LUE Weight Bearing: Non weight bearing LLE Weight Bearing: Touchdown weight bearing LLE Partial Weight Bearing Percentage or Pounds: 50 Pain:  left LE pain - RN made aware  See FIM for current functional status  Therapy/Group: Individual Therapy  [facility] 11/02/2013, 10:59 AM

## 2013-11-03 ENCOUNTER — Inpatient Hospital Stay (HOSPITAL_COMMUNITY): Payer: Medicaid - Out of State | Admitting: Occupational Therapy

## 2013-11-03 ENCOUNTER — Inpatient Hospital Stay (HOSPITAL_COMMUNITY): Payer: Medicaid - Out of State

## 2013-11-03 ENCOUNTER — Inpatient Hospital Stay (HOSPITAL_COMMUNITY): Payer: Medicaid Other | Admitting: *Deleted

## 2013-11-03 ENCOUNTER — Inpatient Hospital Stay (HOSPITAL_COMMUNITY): Payer: Medicaid Other | Admitting: Physical Therapy

## 2013-11-03 ENCOUNTER — Encounter (HOSPITAL_COMMUNITY): Payer: Medicaid - Out of State

## 2013-11-03 ENCOUNTER — Inpatient Hospital Stay (HOSPITAL_COMMUNITY): Payer: Medicaid Other | Admitting: Speech Pathology
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
## 2013-11-03 NOTE — Progress Notes (Signed)
Physical Therapy Session Note  Patient Details  Name: Cory [name] MRN: [id number] Date of Birth: 10-25-1981  Today's Date: 11/03/2013 Time: 1300-1330 Time Calculation (min): 30 min  Short Term Goals: Week 6:  PT Short Term Goal 1 (Week 6): Patient will demonstrate sustained attention to therapeutic activity x30" with mod verbal cues. PT Short Term Goal 2 (Week 6): Patient will demonstrate sustained attention for entireity of each trial of gait training x20'. PT Short Term Goal 3 (Week 6): Patient will follow one step commands with stair negotiation with mod verbal cues and maxA.  Skilled Therapeutic Interventions/Progress Updates:   Pt received in bed sitting upright.  Pt agreeable to getting OOB.  Performed supine > sit supervision and pt remained seated EOB to allow therapist to don KI on LLE with min cues.  Performed stand pivot to gerichair with supervision-min A.  Donned shoes bilat feet in geri-chair.  Transported to gym in geri-chair with pt maintaining LE in knee extension.  Seated in geri-chair engaged pt in ball tossing and ball kicking while counting reps for sustained attention to task as well as focus on bimanual tasks and increasing use of RUE to catch or toss ball and bilat LE during kicking from seated position.  Transitioned to gait with bilat UE ( +2 A for safety) x 50' x 2 reps with one seated rest break and use of chair is visual target to end at; pt required verbal and tactile cues for full step length and foot clearance and assistance to prevent forwards LOB.  Also performed 2 reps ascending and descending 5 stairs with bilat hand rails and mod A for safety secondary to pt ascending with step over step sequence and pushing through LLE; to descend pt did automatically choose step to sequence leading with LLE secondary to presence of KI.  Once back in geri chair pt reclined back and tray table placed to cue pt to remain seated in chair.  Pt given water and left at RN station  until OT session.    Therapy Documentation Precautions:  Precautions Precautions: Fall Precaution Comments:  L LE KI on during movement, ok to be off in bed and sitting in w/c Required Braces or Orthoses: Knee Immobilizer - Left Knee Immobilizer - Left: On when out of bed or walking Other Brace/Splint: Bil PRAFOs and L UE and LE splint can be removed for ROM with Ot/PT only.   Restrictions Weight Bearing Restrictions: Yes LUE Weight Bearing: Non weight bearing LLE Weight Bearing: Touchdown weight bearing LLE Partial Weight Bearing Percentage or Pounds: 50 Vital Signs: Therapy Vitals Pulse Rate: 70 BP: 102/58 mmHg Pain:  No reports of pain at rest; does report LLE pain during gait; eased with sitting Locomotion : Ambulation Ambulation/Gait Assistance: 1: +2 Total assist   See FIM for current functional status  Therapy/Group: Individual Therapy  Edman CircleHall, [name] [facility]aucette 11/03/2013, 2:54 PM

## 2013-11-03 NOTE — Progress Notes (Signed)
Physical Therapy Session Note  Patient Details  Name: Cory [name] MRN: [id number] Date of Birth: 1981/07/27  Today's Date: 11/03/2013 Time: [id number] Time Calculation (min): 25 min  Skilled Therapeutic Interventions/Progress Updates:  1:1. Pt received sitting in geri chair w/ primary PT, this therapist took over. Focus this session on ambulation and toileting. Pt able to amb 25'x2, 30' and 48' w/ max A demonstrating step-to pattern w/ L LE lead, brace on L LE. Pt w/ decreased tolerance to amb x48' 2/2 reports of pain in B LE. Pt req min A for t/f sit<>stand and toilet t/f. Pt did not need to void once sitting on toilet, but brief found to be soiled. Total A for brief change. Easy to re-direct throughout session w/ simple commands. Pt sitting in geri chair at nurses station at end of session.   Therapy Documentation Precautions:  Precautions Precautions: Fall Precaution Comments:  L LE KI on during movement, ok to be off in bed and sitting in w/c Required Braces or Orthoses: Knee Immobilizer - Left Knee Immobilizer - Left: On when out of bed or walking Other Brace/Splint: Bil PRAFOs and L UE and LE splint can be removed for ROM with Ot/PT only.   Restrictions Weight Bearing Restrictions: Yes LUE Weight Bearing: Non weight bearing LLE Weight Bearing: Touchdown weight bearing LLE Partial Weight Bearing Percentage or Pounds: 50  See FIM for current functional status  Therapy/Group: Individual Therapy  Denzil HughesKing, Tracey Stewart S 11/03/2013, 11:33 AM

## 2013-11-03 NOTE — Progress Notes (Signed)
Speech Language Pathology Weekly Progress &  Session Notes  Patient Details  Name: Cory [name] MRN: [id number] Date of Birth: 12-Jul-1981  Today's Date: 11/03/2013 Time: 1130-1210 Time Calculation (min): 40 min  Short Term Goals: Week 7: SLP Short Term Goal 1 (Week 7): Pt will consume trials of thin liquids without overt s/s of aspiration with Max A multimodal cueing SLP Short Term Goal 1 - Progress (Week 7): Met SLP Short Term Goal 2 (Week 7): Pt will consume Dys. 2 textures with nectar-thick liquids with minimal overt s/s of aspiration with Max A verbal cues for utilization of small bites/sips.  SLP Short Term Goal 2 - Progress (Week 7): Met SLP Short Term Goal 3 (Week 7): Pt will demonstrate efficient mastication of Dys. 3 textures without overt s/s of aspiration with Max A multimodal cueing  SLP Short Term Goal 3 - Progress (Week 7): Met SLP Short Term Goal 4 (Week 7): Pt will follow 1 step commands with 90% of opportunities with Min A multimodal cueing  SLP Short Term Goal 4 - Progress (Week 7): Met SLP Short Term Goal 5 (Week 7): pt will initiate functional tasks with 75% of opportunities with Mod A multimodal cueing  SLP Short Term Goal 5 - Progress (Week 7): Met SLP Short Term Goal 6 (Week 7): Pt will demonstrate sustained attention during a functioanl task for 3 minutes with Max A verbal cues for redirection  SLP Short Term Goal 6 - Progress (Week 7): Not met  New Short-Term Goals:  Week 8: STG's=LTG's   Weekly Progress Updates: Pt has made functional gains and has met 5 of 6 STG's due to improvements in swallowing function with diet upgrade, following 1 step commands and initiation of functional tasks. Currently, pt is demonstrating behaviors consistent with a Rancho Level V and requires Max-Total A for sustained attention, functional problem solving, intellectual awareness, safety awareness and working memory. Pt also requires Min-Mod A cueing for orientation, initiation of  tasks and following 1-step commands. Pt is currently verbalizing wants/needs at the word and phrase level but also demonstrates language of confusion with verbal perseveration. Pt is consuming upgraded diet of Dys. 3 textures with thin liquids with minimal overt s/s of aspiration and requires Max cueing to self-monitor and correct wet vocal quality and Max A for utilization of swallowing compensatory strategies. Pt would benefit from continued skilled SLP intervention to maximize cognitive and swallowing function and functional communication in order to maximize overall functional independence.   SLP Intensity: Minumum of 1-2 x/day, 30 to 90 minutes SLP Frequency: 5 out of 7 days SLP Duration/Estimated Length of Stay: TBD due to SNF placement  SLP Treatment/Interventions: Cueing hierarchy;Cognitive remediation/compensation;Environmental controls;Internal/external aids;Therapeutic Exercise;Speech/Language facilitation;Therapeutic Activities;Patient/family education;Dysphagia/aspiration precaution training;Functional tasks  Daily Session Skilled Therapeutic Intervention: Skilled treatment focus on cognitive and dysphagia goals. SLP facilitated session by providing Max multimodal cueing for focused attention to functional tasks due to pt's constant verbal perseveration. Pt required total A to orient to place (hospital) and situation. Pt consumed upgraded lunch meal of Dys. 3 textures with thin liquids with an intermittent wet vocal quality that required Max-total A to self-monitor and correct. Pt also required Max verbal and tactile cues for utilization of swallowing compensatory strategies of small bites/sips and a slow rate. Continue plan of care.  FIM:  Comprehension Comprehension Mode: Auditory Comprehension: 2-Understands basic 25 - 49% of the time/requires cueing 51 - 75% of the time Expression Expression Mode: Verbal Expression: 2-Expresses basic 25 - 49% of the  time/requires cueing 50 - 75% of  the time. Uses single words/gestures. Social Interaction Social Interaction: 2-Interacts appropriately 25 - 49% of time - Needs frequent redirection. Problem Solving Problem Solving: 1-Solves basic less than 25% of the time - needs direction nearly all the time or does not effectively solve problems and may need a restraint for safety Memory Memory: 1-Recognizes or recalls less than 25% of the time/requires cueing greater than 75% of the time FIM - Eating Eating Activity: 5: Needs verbal cues/supervision Pain No/Denies Pain  Therapy/Group: Individual Therapy  Sadi Arave 11/03/2013, 2:25 PM

## 2013-11-03 NOTE — Progress Notes (Signed)
Occupational Therapy Session Note  Patient Details  Name: Cory [name] XXXHarper MRN: [id number] Date of Birth: [birth year]  Today's Date: 11/03/2013 Time: [id number] Time Calculation (min): 60 min  Skilled Therapeutic Interventions/Progress Updates:    Pt worked on Economistcognitive skills of addition, subtraction while engaged in bowling activity.  Pt very distracted throughout session stating "I don't want to do this." "You play too much."  but could be re-directed with max instructional cueing.  Pt sat EOC to roll the ball using his left hand.  Needed max questioning cueing to determine the amount of pins that had been knocked down or that were still standing.  On many occasions pt would state a number randomly without visually looking to see how many were there.  He was unable to perform simple subtraction (ex 10-3 =)  Therapist gave hand over hand assistance to for him to write down his score after each frame.  Pt very internally distracted throughout task requesting to go back to bed.  At end of session pt returned to vail bed with mod facilitation and call button in place.    Therapy Documentation Precautions:  Precautions Precautions: Fall Precaution Comments:  L LE KI on during movement, ok to be off in bed and sitting in w/c Required Braces or Orthoses: Knee Immobilizer - Left Knee Immobilizer - Left: On when out of bed or walking Other Brace/Splint: Bil PRAFOs and L UE and LE splint can be removed for ROM with Ot/PT only.   Restrictions Weight Bearing Restrictions: Yes LUE Weight Bearing: Non weight bearing LLE Weight Bearing: Touchdown weight bearing LLE Partial Weight Bearing Percentage or Pounds: 50  Pain:  No report of pain during session  See FIM for current functional status  Therapy/Group: Individual Therapy  Ladell Lea OTR/L 11/03/2013, 3:56 PM

## 2013-11-03 NOTE — Progress Notes (Signed)
Physical Therapy Session Note  Patient Details  Name: Cory [name] MRN: [id number] Date of Birth: 09/05/1981  Today's Date: 11/03/2013 Time: 1005-1100 Time Calculation (min): 55 min  Short Term Goals: Week 6:  PT Short Term Goal 1 (Week 6): Patient will demonstrate sustained attention to therapeutic activity x30" with mod verbal cues. PT Short Term Goal 2 (Week 6): Patient will demonstrate sustained attention for entireity of each trial of gait training x20'. PT Short Term Goal 3 (Week 6): Patient will follow one step commands with stair negotiation with mod verbal cues and maxA.  Skilled Therapeutic Interventions/Progress Updates:    Patient received sitting in [place]. Session focused on cognitive remediation with emphasis on sequencing, problem solving, and fine motor control. Bimanual integration task for removing lids to medication bottles and replacing. Patient performed sorting of pieces (colors, shapes) with min cues for accuracy and total A for sustained attention. Patient used pieces to make necklace, totalA for sustained attention to task, however patient able to use B hands to perform. Patient continuously calling therapist "mom", re-oriented to therapist's name and role. Patient handed off to next therapist while sitting in [place].  Therapy Documentation Precautions:  Precautions Precautions: Fall Precaution Comments:  L LE KI on during movement, ok to be off in bed and sitting in w/c Required Braces or Orthoses: Knee Immobilizer - Left Knee Immobilizer - Left: On when out of bed or walking Restrictions Weight Bearing Restrictions: Yes LUE Weight Bearing: Non weight bearing LLE Weight Bearing: Touchdown weight bearing LLE Partial Weight Bearing Percentage or Pounds: 50 Locomotion : Ambulation Ambulation/Gait Assistance: Not tested (comment)   See FIM for current functional status  Therapy/Group: Individual Therapy  Cory [name], PT,  DPT 11/03/2013, 12:12 PM

## 2013-11-03 NOTE — Progress Notes (Signed)
Occupational Therapy Session Note  Patient Details  Name: Cory [name] MRN: [id number] Date of Birth: [birth year]  Today's Date: 11/03/2013 Time: 8527-7824 Time Calculation (min): 45 min  Short Term Goals: Week 7:  OT Short Term Goal 1 (Week 7): Pt will demonstrate sustained attention to functional task for 3 min with mod A  OT Short Term Goal 1 - Progress (Week 7): Progressing toward goal OT Short Term Goal 2 (Week 7): Pt will  transfer bed<>w/c with supervision OT Short Term Goal 2 - Progress (Week 7): Met OT Short Term Goal 3 (Week 7): Pt don pants with steadying A  OT Short Term Goal 3 - Progress (Week 7): Met OT Short Term Goal 4 (Week 7): Pt would continently  void 15% of the day with timed toileting OT Short Term Goal 4 - Progress (Week 7): Met  Skilled Therapeutic Interventions/Progress Updates:    Pt asleep in enclosure bed upon arrival but easily aroused. Pt followed one step commands for sitting EOB and transferring to w/c after KI donned.  Pt transferred to toilet but did not void.  Pt engaged in self feeding with max verbal cues for portion control and rate.  Pt required max verbal cues for redirection to task and encouragement to participate.  Focus on activity tolerance, following commands, attention to task, and task initiation.  Therapy Documentation Precautions:  Precautions Precautions: Fall Precaution Comments:  L LE KI on during movement, ok to be off in bed and sitting in w/c Required Braces or Orthoses: Knee Immobilizer - Left Knee Immobilizer - Left: On when out of bed or walking Other Brace/Splint: Bil PRAFOs and L UE and LE splint can be removed for ROM with Ot/PT only.   Restrictions Weight Bearing Restrictions: Yes LUE Weight Bearing: Non weight bearing LLE Weight Bearing: Touchdown weight bearing LLE Partial Weight Bearing Percentage or Pounds: 50  Pain:  Pt denies pain  See FIM for current functional status  Therapy/Group: Individual  Therapy  Leroy Libman 11/03/2013, 8:46 AM

## 2013-11-04 ENCOUNTER — Inpatient Hospital Stay (HOSPITAL_COMMUNITY): Payer: Medicaid Other | Admitting: *Deleted

## 2013-11-04 ENCOUNTER — Inpatient Hospital Stay (HOSPITAL_COMMUNITY): Payer: Medicaid - Out of State | Admitting: Occupational Therapy

## 2013-11-04 NOTE — Progress Notes (Signed)
Patient ID: Cory [name], male   DOB: December 16, 1980, 33 y.o.   MRN: [id number]    Subjective/Complaints: 11/04/13. 33 y/o  With TBI/polytrauma. Making gains with therapy. Walking therapeutically now.  More verbal responses A  review of systems has been performed and if not noted above is otherwise negative. .  Objective: Vital Signs: Blood pressure 95/62, pulse 69, temperature 97.9 F (36.6 C), temperature source Axillary, resp. rate 19, height 5' 2.99" (1.6 m), weight 45.8 kg (100 lb 15.5 oz), SpO2 97.00%. No results found. No results found for this basename: WBC, HGB, HCT, PLT,  in the last 72 hours No results found for this basename: NA, K, CL, CO, GLUCOSE, BUN, CREATININE, CALCIUM,  in the last 72 hours CBG (last 3)  No results found for this basename: GLUCAP,  in the last 72 hours  Wt Readings from Last 3 Encounters:  11/04/13 45.8 kg (100 lb 15.5 oz)  09/13/13 46.811 kg (103 lb 3.2 oz)  09/13/13 46.811 kg (103 lb 3.2 oz)    Physical Exam:  Constitutional:   Alert to restless, alternating, supine to sit as well as dual leg lifts, will not cooperate with MMT Eyes: Right eye exhibits no discharge. Left eye exhibits no discharge.  Neck:  [name] , stoma closed Cardiovascular: Normal rate and regular rhythm.  Respiratory: Effort normal.  GI: Soft. Stoma with scab no drainage;  Fecal incontinence noted   Awake. Remains distracted. Makes eye contacts and does speaks now.  Musc:  Left arm flexes and extends.      Assessment/Plan: 1. 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  LOS (Days) 52 A FACE TO FACE EVALUATION WAS PERFORMED  Rogelia BogaKWIATKOWSKI,PETER FRANK 11/04/2013 10:22 AM

## 2013-11-04 NOTE — Progress Notes (Signed)
Occupational Therapy Session Note  Patient Details  Name: Cory [name] MRN: [id number] Date of Birth: 10-21-1981  Today's Date: 11/04/2013 Time: 1345-1430 Time Calculation (min): 45 min  Short Term Goals: No short term goals set STG=LTG  Skilled Therapeutic Interventions/Progress Updates:    1:1 Pt in bed awake when arrived. Pt already verbally preservating saying "hurry up" even before activity begins. Pt oriented to hospital with extra time and verbal cues to think before answering. Pt responds saying he is here for therapy. Pt performs basic transfers with steady A to close supervision bed<w/c<>toilet for trial to void. Pt donned shorts with steady A, encouragement and extra time. Pt able to don right shoe and attempt to successfully tie it but required A for left shoe due to bandage. Pt assisted with gathering clean laundry from dryer with more than reasonable time with activity setup to promote functional use of right UE. Participated in folding clothing and sortting them by article with min cuing. Pt continued to be verbally preservative and at first unwilling to attempt task but with ignoring behavior and given extra time pt would perform.   Therapy Documentation Precautions:  Precautions Precautions: Fall Precaution Comments:  L LE KI on during movement, ok to be off in bed and sitting in w/c Required Braces or Orthoses: Knee Immobilizer - Left Knee Immobilizer - Left: On when out of bed or walking Other Brace/Splint: Bil PRAFOs and L UE and LE splint can be removed for ROM with Ot/PT only.   Restrictions Weight Bearing Restrictions: Yes LUE Weight Bearing: Non weight bearing LLE Weight Bearing: Touchdown weight bearing LLE Partial Weight Bearing Percentage or Pounds: 50 Pain: Pain Assessment Pain Score: 0-No pain  See FIM for current functional status  Therapy/Group: Individual Therapy  Roney MansSmith, Cory [name] 11/04/2013, 2:48 PM

## 2013-11-04 NOTE — Progress Notes (Signed)
Physical Therapy Session Note  Patient Details  Name: Cory [name] MRN: [id number] Date of Birth: Aug 09, 1981  Today's Date: 11/04/2013 Time: 10:15-11:00 (45min)   Skilled Therapeutic Interventions/Progress Updates:  Tx focused on sustained attention, activity tolerance, and cognitive remediation . No +2 assist was available, so gait not attempted this session. Pt up in gerichair at nurses station, wanting water. Staff pushed pt to gym with water, using self-control techniques suggested by SLP for "not talking while chair is being pushed." Pt able to recall instructions when he began using his repeated phrases and chair was stopped.  Attempted various tasks to engage pt including Wii bowling and boxing, but pt needing max A to attend to task, wanting to go to bed. Also attempted ball hitting and kicking. Pt able to count 5 hits with the negotiation of being complete with this task once counted.  Pt successfully used both hands to load and fire "pot shot" gun x10 to hit cup targets x3 in day room. Pt able to attend to fallen ball locations and tell therapist where they had gone.  Pt pushed back to room with same self-control instruction, and transferred to bed with min A. [place] bed closed with zip hook latched.       Therapy Documentation Precautions:  Precautions Precautions: Fall Precaution Comments:  L LE KI on during movement, ok to be off in bed and sitting in w/c Required Braces or Orthoses: Knee Immobilizer - Left Knee Immobilizer - Left: On when out of bed or walking Other Brace/Splint: Bil PRAFOs and L UE and LE splint can be removed for ROM with Ot/PT only.   Restrictions Weight Bearing Restrictions: Yes LUE Weight Bearing: Non weight bearing LLE Weight Bearing: Touchdown weight bearing LLE Partial Weight Bearing Percentage or Pounds: 50    Pain: Pain Assessment Pain Assessment: No/denies pain Pain Score: 0-No pain  See FIM for current functional  status  Therapy/Group: Individual Therapy  Clydene Lamingole Castor Gittleman, PT, DPT   11/04/2013, 10:55 AM

## 2013-11-05 ENCOUNTER — Inpatient Hospital Stay (HOSPITAL_COMMUNITY): Payer: Medicaid Other | Admitting: Physical Therapy

## 2013-11-05 DIAGNOSIS — IMO0001 Reserved for inherently not codable concepts without codable children: Secondary | ICD-10-CM

## 2013-11-05 NOTE — Progress Notes (Signed)
Physical Therapy Note  Patient Details  Name: Cory [name] MRN: [id number] Date of Birth: [birth year] Today's Date: 11/05/2013  Time: 1040-1125 45 minutes  1:1 Pt c/o pain in R LE, RN made aware and meds given.  Session focused on pt participation and attention to task. Ball toss and kick with pt requiring min-mod cuing to count number of throws/kicks.  Gait with +2 HHA multiple attempts up to 30' with min A. Pt requires assist for posture as he tends to forward flex trunk when fatigued, pt also with decreased step length on R when fatigued, requires cues for safety as pt is impulsive with movements.  Attempt gait with shopping cart, able to perform with mod A for balance and safety, second person for maneuvering the shopping cart.  Stair negotiation with B handrails with mod A, total cuing for safety.  Nu step for 3 minutes with focus on pt sustaining attention to task and completing task.  Pt able to perform nu step x 90 seconds before requiring cues to continue task.  Seated sorting activities with min cuing for task, max encouragement due to pt perseverating on getting back in bed.   Cory [name] 11/05/2013, 11:26 AM

## 2013-11-05 NOTE — Progress Notes (Signed)
Patient ID: Cory [name], male   DOB: Sep 12, 1981, 33 y.o.   MRN: [id number]  Patient ID: Cory [name], male   DOB: Sep 12, 1981, 33 y.o.   MRN: [id number]    Subjective/Complaints: 11/05/13. 33 y/o  With TBI/polytrauma.  Making gains with therapy. Walking therapeutically now.  More verbal responses. Ambulatory with 2 person assistance; out of enclosure bed and sitting in chair this am A  review of systems has been performed and if not noted above is otherwise negative.  Past Medical History  Diagnosis Date  . Depression     Pt was planning to start taking abilify 10/20   Patient Vitals for the past 24 hrs:  BP Temp Temp src Pulse Resp SpO2 Weight  11/05/13 0519 102/67 mmHg 98.1 F (36.7 C) Oral 80 18 97 % 45.4 kg (100 lb 1.4 oz)  11/04/13 1500 99/59 mmHg 98.2 F (36.8 C) Oral 82 16 98 % -    No intake or output data in the 24 hours ending 11/05/13 0929  .  Objective: Vital Signs: Blood pressure 102/67, pulse 80, temperature 98.1 F (36.7 C), temperature source Oral, resp. rate 18, height 5' 2.99" (1.6 m), weight 45.4 kg (100 lb 1.4 oz), SpO2 97.00%. No results found. No results found for this basename: WBC, HGB, HCT, PLT,  in the last 72 hours No results found for this basename: NA, K, CL, CO, GLUCOSE, BUN, CREATININE, CALCIUM,  in the last 72 hours CBG (last 3)  No results found for this basename: GLUCAP,  in the last 72 hours  Wt Readings from Last 3 Encounters:  11/05/13 45.4 kg (100 lb 1.4 oz)  09/13/13 46.811 kg (103 lb 3.2 oz)  09/13/13 46.811 kg (103 lb 3.2 oz)    Physical Exam:  Constitutional:   Alert to restless to slightly agitated, alternating, supine to sit as well as dual leg lifts, will not cooperate with MMT Eyes: Right eye exhibits no discharge. Left eye exhibits no discharge.  Neck:  [name] , stoma closed Cardiovascular: Normal rate and regular rhythm.  Respiratory: Effort normal.  GI: Soft. Stoma with scab no drainage;  Fecal incontinence  noted   Awake. Remains distracted. Makes eye contacts and much more verbal now     Assessment/Plan: 1. 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  LOS (Days) 53 A FACE TO FACE EVALUATION WAS PERFORMED  Rogelia BogaKWIATKOWSKI,Glendi Mohiuddin FRANK 11/05/2013 9:28 AM

## 2013-11-06 ENCOUNTER — Inpatient Hospital Stay (HOSPITAL_COMMUNITY): Payer: Medicaid - Out of State | Admitting: Occupational Therapy

## 2013-11-06 ENCOUNTER — Inpatient Hospital Stay (HOSPITAL_COMMUNITY): Payer: Medicaid Other | Admitting: *Deleted

## 2013-11-06 ENCOUNTER — Inpatient Hospital Stay (HOSPITAL_COMMUNITY): Payer: Medicaid Other

## 2013-11-06 ENCOUNTER — Encounter (HOSPITAL_COMMUNITY): Payer: Medicaid - Out of State | Admitting: Occupational Therapy

## 2013-11-06 DIAGNOSIS — I69991 Dysphagia following unspecified cerebrovascular disease: Secondary | ICD-10-CM

## 2013-11-06 DIAGNOSIS — IMO0002 Reserved for concepts with insufficient information to code with codable children: Secondary | ICD-10-CM

## 2013-11-06 DIAGNOSIS — S069XAA Unspecified intracranial injury with loss of consciousness status unknown, initial encounter: Secondary | ICD-10-CM

## 2013-11-06 DIAGNOSIS — S069X9A Unspecified intracranial injury with loss of consciousness of unspecified duration, initial encounter: Secondary | ICD-10-CM

## 2013-11-06 NOTE — Progress Notes (Signed)
Subjective/Complaints: Uneventful weekend  A  review of systems has been performed and if not noted above is otherwise negative. .  Objective: Vital Signs: Blood pressure 103/72, pulse 74, temperature 98.3 F (36.8 C), temperature source Oral, resp. rate 20, height 5' 2.99" (1.6 m), weight 45.3 kg (99 lb 13.9 oz), SpO2 99.00%. No results found. No results found for this basename: WBC, HGB, HCT, PLT,  in the last 72 hours No results found for this basename: NA, K, CL, CO, GLUCOSE, BUN, CREATININE, CALCIUM,  in the last 72 hours CBG (last 3)  No results found for this basename: GLUCAP,  in the last 72 hours  Wt Readings from Last 3 Encounters:  11/06/13 45.3 kg (99 lb 13.9 oz)  09/13/13 46.811 kg (103 lb 3.2 oz)  09/13/13 46.811 kg (103 lb 3.2 oz)    Physical Exam:  Constitutional:   Alert to restless, alternating, supine to sit as well as dual leg lifts, will not cooperate with MMT Eyes: Right eye exhibits no discharge. Left eye exhibits no discharge.  Neck:  [name] , stoma closed Cardiovascular: Normal rate and regular rhythm.  Respiratory: Effort normal.  GI: Soft. Stoma with scab no drainageNeurological:    Awake. Remains distracted. Makes eye contacts and does speaks now. Can be irritated Musc:  Left arm and leg pain, deformities.      Assessment/Plan: 1. 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 Physiatrist is providing close team supervision and 24 hour management of active medical problems listed below. Physiatrist and rehab team continue to assess barriers to discharge/monitor patient progress toward functional and medical goals.       FIM: FIM - Bathing Bathing Steps Patient Completed: Chest;Abdomen;Right upper leg;Left upper leg;Right Arm Bathing: 1: Two helpers  FIM - Upper Body Dressing/Undressing Upper body dressing/undressing steps  patient completed: Thread/unthread right sleeve of pullover shirt/dresss;Thread/unthread left sleeve of pullover shirt/dress;Put head through opening of pull over shirt/dress;Pull shirt over trunk Upper body dressing/undressing: 5: Set-up assist to: Obtain clothing/put away FIM - Lower Body Dressing/Undressing Lower body dressing/undressing steps patient completed: Thread/unthread right pants leg;Pull pants up/down;Don/Doff left sock;Don/Doff left shoe Lower body dressing/undressing: 1: Total-Patient completed less than 25% of tasks  FIM - Toileting Toileting steps completed by patient: Adjust clothing prior to toileting Toileting Assistive Devices: Grab bar or rail for support Toileting: 1: Total-Patient completed zero steps, helper did all 3  FIM - Diplomatic Services operational officerToilet Transfers Toilet Transfers Assistive Devices: Grab bars Toilet Transfers: 4-To toilet/BSC: Min A (steadying Pt. > 75%);4-From toilet/BSC: Min A (steadying Pt. > 75%)  FIM - Bed/Chair Transfer Bed/Chair Transfer Assistive Devices: Arm rests Bed/Chair Transfer: 5: Sit > Supine: Supervision (verbal cues/safety issues);4: Chair or W/C > Bed: Min A (steadying Pt. > 75%)  FIM - Locomotion: Wheelchair Distance: 125 Locomotion: Wheelchair: 1: Total Assistance/staff pushes wheelchair (Pt<25%) FIM - Locomotion: Ambulation Locomotion: Ambulation Assistive Devices: Other (comment) (+2 HHA) Ambulation/Gait Assistance: 1: +2 Total assist Locomotion: Ambulation: 0: Activity did not occur  Comprehension Comprehension Mode: Auditory Comprehension: 2-Understands basic 25 - 49% of the time/requires cueing 51 - 75% of the time  Expression Expression Mode: Verbal Expression: 2-Expresses basic 25 - 49% of the time/requires cueing 50 - 75% of the time. Uses single words/gestures.  Social Interaction Social Interaction: 2-Interacts appropriately 25 - 49% of time - Needs frequent redirection.  Problem Solving Problem Solving: 1-Solves basic less  than 25% of the time -  needs direction nearly all the time or does not effectively solve problems and may need a restraint for safety  Memory Memory: 1-Recognizes or recalls less than 25% of the time/requires cueing greater than 75% of the time  Medical Problem List and Plan:  1. DVT Prophylaxis/Anticoagulation: Pharmaceutical: Lovenox  2. Pain Management: Prn oxycodone for outward signs of pain.  3. Mood: Currently too low level to evaluate.  4. Neuropsych: This patient is not capable of making decisions on his own behalf.   -stopped amantadine  -some of agitation may be him acting out a little. As a whole he has shown some neurological improvement 5. Recurrent Fever: afebrile    -wounds improving 6. Left tibial plateau Fx--s/p ORIF: PWB in KI (not really practical). ---essentially will be non-weight bearing 7. Left humerus Fx--s/p ORIF:   -HO and MO surrounding injury site   8. Leucocytosis: improving 9. Left shin and heel wound: Continue bilateral PRAFO.   Keflex initiated for coverage--dc 10. ABLA: stable 11. Dysphagia: Continue TF for supplement. Continue D2, nectars liquids with assistance.   12. Agitation: seroquel- decreased to hs only, continue inderal and zoloft     -ativan prn.   -would like to move him to a hi-lo bed soon 13. FEN: good po appreciate dietician notes LOS (Days) 54 A FACE TO FACE EVALUATION WAS PERFORMED  Cory [name] T 11/06/2013 8:06 AM

## 2013-11-06 NOTE — Progress Notes (Signed)
Occupational Therapy Session Note  Patient Details  Name: Cory [name] MRN: [id number] Date of Birth: 24-Jun-1981  Today's Date: 11/06/2013 Time: 0830-0930 Time Calculation (min): 60 min  Short Term Goals: No short term goals set STG=LTG  Skilled Therapeutic Interventions/Progress Updates:    1:1 self care retraining including transfer bed to w/c<>toilet, w/c to regular shower bench. Pt engaged in showering with max verbal cues for sequencing and task organization. Continued focused on sit to stand, standing balance with UE on grab bar, following commands, sustained attention to functional task, and functional use of right UE. Pt continues to require max to total A to attend to a task. Pt continues to perseverate verbally throughout session.   2nd session 14:00-14:45  1:1 Focus on following one step directions, sustained attention in a quiet environment. Pt transferred to Nustep and performed LB exercise with goal for sustained attention to task for 2 min - pt required max cuing. Completed clothespin task with right hand with mod encouragement to perform task consistently with right hand. Pt able to keep track of clothespin donned on pole and how many more he needed to don with min questioning cuing. Pt required more than reasonable amt of time but able to complete task.   Therapy Documentation Precautions:  Precautions Precautions: Fall Precaution Comments:  L LE KI on during movement, ok to be off in bed and sitting in w/c Required Braces or Orthoses: Knee Immobilizer - Left Knee Immobilizer - Left: On when out of bed or walking Other Brace/Splint: Bil PRAFOs and L UE and LE splint can be removed for ROM with Ot/PT only.   Restrictions Weight Bearing Restrictions: Yes LUE Weight Bearing: Non weight bearing LLE Weight Bearing: Touchdown weight bearing LLE Partial Weight Bearing Percentage or Pounds: 50 Pain: Pain Assessment Pain Assessment: No/denies pain  See FIM for current  functional status  Therapy/Group: Individual Therapy  Roney MansSmith, Myana Schlup [facility] 11/06/2013, 4:12 PM

## 2013-11-06 NOTE — Progress Notes (Signed)
Speech Language Pathology Daily Session Note  Patient Details  Name: Cory [name] XXXHarper MRN: [id number] Date of Birth: Feb 22, 1981  Today's Date: 11/06/2013 Time: 1130-1215 Time Calculation (min): 45 min  Short Term Goals: Week 8: STG's=LTG's  Skilled Therapeutic Interventions: Skilled treatment focus on cognitive and dysphagia goals. SLP facilitated session by providing Mod multimodal cueing for focused attention to self-feeding task due to pt's constant verbal perseveration. Pt also required Mod verbal and tactile cues for utilization of small bites/sips and did not demonstrate any overt s/s of aspiration throughout the meal. Pt oriented to situation with Mod question cues and time with Mod verbal and visual cues. Pt also participated in naming task with picture cards and required Max visual, semantic and phonemic for accuracy and demonstrated verbal perseveration throughout the task. Continue plan of care.    FIM:  Comprehension Comprehension Mode: Auditory Comprehension: 2-Understands basic 25 - 49% of the time/requires cueing 51 - 75% of the time Expression Expression Mode: Verbal Expression: 2-Expresses basic 25 - 49% of the time/requires cueing 50 - 75% of the time. Uses single words/gestures. Social Interaction Social Interaction: 2-Interacts appropriately 25 - 49% of time - Needs frequent redirection. Problem Solving Problem Solving: 1-Solves basic less than 25% of the time - needs direction nearly all the time or does not effectively solve problems and may need a restraint for safety Memory Memory: 1-Recognizes or recalls less than 25% of the time/requires cueing greater than 75% of the time FIM - Eating Eating Activity: 5: Needs verbal cues/supervision  Pain Pain Assessment Pain Assessment: No/denies pain  Therapy/Group: Individual Therapy  Rozalia Dino 11/06/2013, 4:02 PM

## 2013-11-06 NOTE — Progress Notes (Signed)
Physical Therapy Session Note  Patient Details  Name: Cory [name] MRN: [id number] Date of Birth: 1981-06-23  Today's Date: 11/06/2013 Time: 0930-1030 and 1304-1400 Time Calculation (min): 60 min and 56 min  Short Term Goals: Week 6:  PT Short Term Goal 1 (Week 6): Patient will demonstrate sustained attention to therapeutic activity x30" with mod verbal cues. PT Short Term Goal 2 (Week 6): Patient will demonstrate sustained attention for entireity of each trial of gait training x20'. PT Short Term Goal 3 (Week 6): Patient will follow one step commands with stair negotiation with mod verbal cues and maxA.  Skilled Therapeutic Interventions/Progress Updates:    AM Session: Patient received [place] in [place] [employer] [facility]. Session focused on toileting, sustained attention to task, and self-monitoring. Staff pushed patient to dayroom, using self-control techniques suggested by SLP for "not talking while chair is being pushed." Patient able to recall instructions when he began using his perseverative phrases and chair was stopped. Patient engaged in pipe tree activity (making cross) and requires max cues for sustained attention to task as well as max cues to disassemble. Patient states "I have to use bathroom". Transported to bathroom, minA for transfer, totalA for clothing management and hygiene. Patient continent of bowel. Patient left sitting in [place] at [employer] station, RN aware.  PM Session: Patient received supine in bed. Session focused on toileting and sustained attention during functional mobility activites. Patient incontinent of bladder, minA transfer to toilet, patient able to doff pants, shirt, and sweatshirt and don new ones with max cues for sustained attention to task, no cues for sequencing. Wheelchair mobility 125' x2 with minA, functional ambulation with KI donned L LE 30' x2 with +2 HHA (minA of one helper, modA of second helper), 5 stairs with 2 handrails with maxA of one  helper. Emphasis during functional mobility on self-control techniques to inhibit perseverative phrases and facilitate improved sustained attention to task. Patient responds well to rewards/consequences to reinforce positive behavior. Patient left sitting in wheelchair, handed off to OT for next session.  Therapy Documentation Precautions:  Precautions Precautions: Fall Precaution Comments:  L LE KI on during movement, ok to be off in bed and sitting in w/c Required Braces or Orthoses: Knee Immobilizer - Left Knee Immobilizer - Left: On when out of bed or walking Restrictions Weight Bearing Restrictions: Yes LUE Weight Bearing: Non weight bearing LLE Weight Bearing: Touchdown weight bearing LLE Partial Weight Bearing Percentage or Pounds: 50 Pain: Pain Assessment Pain Assessment: No/denies pain Pain Score: 0-No pain Locomotion : Ambulation Ambulation/Gait Assistance: Not tested (comment)   See FIM for current functional status  Therapy/Group: Individual Therapy  Chipper HerbBridget S Elesa Garman S. Hakop Humbarger, PT, DPT 11/06/2013, 11:06 AM
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
## 2013-11-06 NOTE — Progress Notes (Signed)
Social Work Patient ID: Cory [name], male   DOB: [birth year], 33 y.o.   MRN: [id number]  Continue to work on SNF placement.  Had hoped pt's Medicaid might be activated by this point, however, still in pending status.  [facility] with [facility] - Social Work Director - I was instructed [facility] and discuss need to try and secure a bed there.  Have spoken with admissions/ SW dept last week and again this morning but no decision yet made on bed offer.  Will keep team posted.  Jericho Alcorn, LCSW

## 2013-11-07 ENCOUNTER — Encounter (HOSPITAL_COMMUNITY): Payer: Medicaid - Out of State | Admitting: Occupational Therapy

## 2013-11-07 ENCOUNTER — Inpatient Hospital Stay (HOSPITAL_COMMUNITY): Payer: Medicaid - Out of State | Admitting: Occupational Therapy

## 2013-11-07 ENCOUNTER — Inpatient Hospital Stay (HOSPITAL_COMMUNITY): Payer: Medicaid Other | Admitting: *Deleted

## 2013-11-07 ENCOUNTER — Inpatient Hospital Stay (HOSPITAL_COMMUNITY): Payer: Medicaid Other | Admitting: Speech Pathology

## 2013-11-07 DIAGNOSIS — S069X9A Unspecified intracranial injury with loss of consciousness of unspecified duration, initial encounter: Secondary | ICD-10-CM

## 2013-11-07 DIAGNOSIS — I69991 Dysphagia following unspecified cerebrovascular disease: Secondary | ICD-10-CM

## 2013-11-07 DIAGNOSIS — S069XAA Unspecified intracranial injury with loss of consciousness status unknown, initial encounter: Secondary | ICD-10-CM

## 2013-11-07 DIAGNOSIS — IMO0002 Reserved for concepts with insufficient information to code with codable children: Secondary | ICD-10-CM

## 2013-11-07 NOTE — Progress Notes (Signed)
Physical Therapy Session Note  Patient Details  Name: Cory [name] MRN: [id number] Date of Birth: April 28, 1981  Today's Date: 11/07/2013 Time: 0930-1030 and [id number] Time Calculation (min): 60 min and 50 min  Short Term Goals: Week 6:  PT Short Term Goal 1 (Week 6): Patient will demonstrate sustained attention to therapeutic activity x30" with mod verbal cues. PT Short Term Goal 2 (Week 6): Patient will demonstrate sustained attention for entireity of each trial of gait training x20'. PT Short Term Goal 3 (Week 6): Patient will follow one step commands with stair negotiation with mod verbal cues and maxA.  Skilled Therapeutic Interventions/Progress Updates:    AM Session: [place] Session focused on sustained attention to task, visual scanning, cognitive remediation with emphasis on orientation, and toileting. Patient presented with laptop computer and instructed to type his name. Patient requires totalA for sustained attention to task, min cues for accuracy with identification of letters. Patient able to identify/state what each letter is on 75% of attempts. Patient states "I have to use bathroom". Transported to bathroom, minA for transfer, totalA for clothing management and hygiene. Patient continent of bowel. In ADL apartment, assisted patient with lowering to floor to attempt floor transfer. Patient uses couch and requires minA for floor transfer, therapist required to assist with maintaining NWB on L UE. Patient left sitting in [place] at [employer] station, RN aware.  PM Session: Patient received sitting in wheelchair from OT. Functional ambulation 20' x2 with maxA and +2 for negotiation of 5 stairs with one handrail. Co-treatment with Rec Therapy for second half with emphasis on static standing, sustained attention, sequencing, and problem-solving during therapeutic activity of making milkshake. Patient demonstrating significant apraxia with steps of activity, poor  frustration tolerance, and increased verbal perseveration with asking to go to bed. Patient performed wheelchair mobility ADL apartment>room, approx 150' with minA. Patient left supine in enclosure bed.  Therapy Documentation Precautions:  Precautions Precautions: Fall Precaution Comments:  L LE KI on during movement, ok to be off in bed and sitting in w/c Required Braces or Orthoses: Knee Immobilizer - Left Knee Immobilizer - Left: On when out of bed or walking Restrictions Weight Bearing Restrictions: Yes LUE Weight Bearing: Non weight bearing LLE Weight Bearing: Touchdown weight bearing LLE Partial Weight Bearing Percentage or Pounds: 50 General: Amount of Missed PT Time (min): 10 Minutes Missed Time Reason: Patient fatigue Pain: Pain Assessment Pain Assessment: No/denies pain  See FIM for current functional status  Therapy/Group: Individual Therapy  Cory [name], PT, DPT 11/07/2013, 3:04 PM
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
## 2013-11-07 NOTE — Progress Notes (Signed)
Recreational Therapy Session Note  Patient Details  Name: Cory [name] MRN: [id number] Date of Birth: July 25, 1981 Today's Date: 11/07/2013  Pain: no c/o Skilled Therapeutic Interventions/Progress Updates: Session focused on activity tolerance, w/c mobility, standing balance, UE use, sustained attention, problem solving, following simple instructions during kitchen activity making a milkshake.  Pt with poor frustration tolerance during session needing verbal cues for redirection.  Pt required max-total assist for task completion due to apraxia, decreased safety, decreased cognition, decreased balance. Therapy/Group: Co-Treatment and Group Therapy   Cory [name] 11/07/2013, 4:34 PM
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
## 2013-11-07 NOTE — Progress Notes (Signed)
Speech Language Pathology Daily Session Note  Patient Details  Name: Shepard Generalykwan XXXHarper MRN: [id number] Date of Birth: 09-03-1981  Today's Date: 11/07/2013 Time: 1130-1215 Time Calculation (min): 45 min  Short Term Goals: Week 8: STG's=LTG's  Skilled Therapeutic Interventions: Skilled treatment focus on cognitive and dysphagia goals. SLP facilitated session by providing Mod multimodal cueing for focused attention to self-feeding task due to pt's constant verbal perseveration, although verbal perseveration was decreased this session. Pt also required Mod verbal and tactile cues for utilization of small bites/sips and demonstrated a wet vocal quality at end of meal that required min verbal cues to self-monitor and correct.  Pt also wrote his name with his L UE with Mod I but required encouragement to perform task. Continue plan of care.     FIM:  Comprehension Comprehension Mode: Auditory Comprehension: 2-Understands basic 25 - 49% of the time/requires cueing 51 - 75% of the time Expression Expression Mode: Verbal Expression: 2-Expresses basic 25 - 49% of the time/requires cueing 50 - 75% of the time. Uses single words/gestures. Social Interaction Social Interaction: 2-Interacts appropriately 25 - 49% of time - Needs frequent redirection. Problem Solving Problem Solving: 1-Solves basic less than 25% of the time - needs direction nearly all the time or does not effectively solve problems and may need a restraint for safety Memory Memory: 1-Recognizes or recalls less than 25% of the time/requires cueing greater than 75% of the time FIM - Eating Eating Activity: 5: Needs verbal cues/supervision  Pain Pain Assessment Pain Assessment: No/denies pain Pain Score: 0-No pain  Therapy/Group: Individual Therapy  Carolle Ishii 11/07/2013, 1:04 PM

## 2013-11-07 NOTE — Progress Notes (Signed)
Occupational Therapy Session Note  Patient Details   Name: Cory [name] MRN: [id number] Date of Birth: 12/17/1980  Today's Date: 11/07/2013 Time: 13:00pm-13:45PM Time Calculation (min): 45 min  Short Term Goals: STG= LTG  Skilled Therapeutic Interventions/Progress Updates:    1:1 - Pt was supine on arrival in veil bed. Pt required tactile input and v/c to initiate supine <>sit with Min (A). Pt transferred eob<>w/c Min (A) stand pivot. Pt found to be incontinent with toilet transfer and (A)ing with doff of diaper Min (A). Pt required mod (A) to pull up pants/ diaper. Pt needed tactile and v/c for wash hands. Pt attempting to only wash LT UE initially. Pt self propelled w/c with bil LE toward gym with v/c. Pt easily distracted by environment of large gym (focused attention) during a RT UE pinch grasp task. Pt terminating task prematurely and needed redirection to complete. Session moved to a less noisy environment for sorting blocks by color, counting blocks and attempting to match pattern. Pt motivated to engage by snack and drink at end of session. Pt mod (A) to fill cup with water and retrieve ice cream. Pt perseverating and distracted by internal factors.   Therapy Documentation Precautions:  Precautions Precautions: Fall Precaution Comments:  L LE KI on during movement, ok to be off in bed and sitting in w/c Required Braces or Orthoses: Knee Immobilizer - Left Knee Immobilizer - Left: On when out of bed or walking Cervical Brace: Hard collar;At all times Other Brace/Splint: Bil PRAFOs and L UE and LE splint can be removed for ROM with Ot/PT only.   Restrictions Weight Bearing Restrictions: Yes LUE Weight Bearing: Non weight bearing LLE Weight Bearing: Touchdown weight bearing LLE Partial Weight Bearing Percentage or Pounds: 50 General:  Pain: RN aware and premedicated patient for Leg discomfort ADL: ADL Eating: Minimal cueing;Minimal assistance Where Assessed-Eating:  Wheelchair (sitting at [employer] station) Toileting: Moderate cueing;Minimal assistance Where Assessed-Toileting: Teacher, adult educationToilet Toilet Transfer: Minimal assistance;Moderate verbal cueing Toilet Transfer Method: Stand pivot    :    See FIM for current functional status  Therapy/Group: Individual Therapy  Roney MansSmith, Forrest Jaroszewski [facility] 11/07/2013, 3:42 PM

## 2013-11-07 NOTE — Progress Notes (Signed)
Social Work Patient ID: Shepard Generalykwan XXXHarper, male   DOB: [birth year], 33 y.o.   MRN: [id number]   Have reviewed team conference with pt's mother (via phone) and have alerted her that I am in discussion with the Admissions Dept at [facility] about possible bed offer.  [facility] and am awaiting decision from their administration.  Will keep family and team posted.  Rhiannon Sassaman, LCSW

## 2013-11-08 ENCOUNTER — Inpatient Hospital Stay (HOSPITAL_COMMUNITY): Payer: Medicaid - Out of State | Admitting: *Deleted

## 2013-11-08 ENCOUNTER — Inpatient Hospital Stay (HOSPITAL_COMMUNITY): Payer: Medicaid - Out of State | Admitting: Speech Pathology

## 2013-11-08 ENCOUNTER — Inpatient Hospital Stay
Admission: RE | Admit: 2013-11-08 | Discharge: 2013-11-20 | Disposition: A | Discharge status: Home / Self Care | Payer: No Typology Code available for payment source | Source: Ambulatory Visit | Attending: Internal Medicine | Admitting: Internal Medicine

## 2013-11-08 ENCOUNTER — Inpatient Hospital Stay (HOSPITAL_COMMUNITY): Payer: Medicaid - Out of State | Admitting: Physical Therapy

## 2013-11-08 ENCOUNTER — Inpatient Hospital Stay (HOSPITAL_COMMUNITY): Payer: Medicaid Other | Admitting: *Deleted

## 2013-11-08 ENCOUNTER — Encounter (HOSPITAL_COMMUNITY): Payer: Medicaid Other | Admitting: Occupational Therapy

## 2013-11-08 ENCOUNTER — Other Ambulatory Visit: Payer: Self-pay | Admitting: *Deleted

## 2013-11-08 DIAGNOSIS — I69991 Dysphagia following unspecified cerebrovascular disease: Secondary | ICD-10-CM

## 2013-11-08 DIAGNOSIS — S069XAA Unspecified intracranial injury with loss of consciousness status unknown, initial encounter: Secondary | ICD-10-CM

## 2013-11-08 DIAGNOSIS — W19XXXA Unspecified fall, initial encounter: Principal | ICD-10-CM

## 2013-11-08 DIAGNOSIS — S069X9A Unspecified intracranial injury with loss of consciousness of unspecified duration, initial encounter: Secondary | ICD-10-CM

## 2013-11-08 DIAGNOSIS — IMO0002 Reserved for concepts with insufficient information to code with codable children: Secondary | ICD-10-CM

## 2013-11-08 MED ORDER — HYDROCERIN EX CREA: 1.0000 "application " | TOPICAL_CREAM | Freq: Two times a day (BID) | CUTANEOUS | Status: DC

## 2013-11-08 MED ORDER — ACETAMINOPHEN 325 MG PO TABS: 325.0000 mg | ORAL_TABLET | ORAL | Status: DC | PRN

## 2013-11-08 MED ORDER — DOCUSATE SODIUM 283 MG RE ENEM: 1.0000 | ENEMA | Freq: Every day | RECTAL | Status: DC

## 2013-11-08 MED ORDER — SACCHAROMYCES BOULARDII 250 MG PO CAPS: 500.0000 mg | ORAL_CAPSULE | Freq: Two times a day (BID) | ORAL | Status: DC

## 2013-11-08 MED ORDER — DICLOFENAC SODIUM 1 % TD GEL: 2.0000 g | Freq: Three times a day (TID) | TRANSDERMAL | Status: DC

## 2013-11-08 MED ORDER — TRAMADOL HCL 50 MG PO TABS: 50.0000 mg | ORAL_TABLET | Freq: Two times a day (BID) | ORAL | Status: DC

## 2013-11-08 MED ORDER — METHOCARBAMOL 500 MG PO TABS: 500.0000 mg | ORAL_TABLET | Freq: Four times a day (QID) | ORAL | Status: DC | PRN

## 2013-11-08 MED ORDER — OXYCODONE HCL 5 MG PO TABS: 5.0000 mg | ORAL_TABLET | Freq: Four times a day (QID) | ORAL | Status: DC | PRN

## 2013-11-08 MED ORDER — NAPHAZOLINE-PHENIRAMINE 0.025-0.3 % OP SOLN: 2.0000 [drp] | Freq: Three times a day (TID) | OPHTHALMIC | Status: DC

## 2013-11-08 MED ORDER — TRAMADOL HCL 50 MG PO TABS: ORAL_TABLET | ORAL | Status: DC

## 2013-11-08 MED ORDER — PROPRANOLOL HCL 20 MG PO TABS: 20.0000 mg | ORAL_TABLET | Freq: Three times a day (TID) | ORAL | Status: DC

## 2013-11-08 MED ORDER — OXYCODONE HCL 5 MG PO TABS: ORAL_TABLET | ORAL | Status: DC

## 2013-11-08 MED ORDER — PROPRANOLOL HCL 20 MG PO TABS
20.0000 mg | ORAL_TABLET | Freq: Three times a day (TID) | ORAL | Status: DC
  Filled 2013-11-08 (×3): qty 1

## 2013-11-08 MED ORDER — SERTRALINE HCL 50 MG PO TABS: 50.0000 mg | ORAL_TABLET | Freq: Every day | ORAL | Status: DC

## 2013-11-08 MED ORDER — QUETIAPINE FUMARATE 25 MG PO TABS: 25.0000 mg | ORAL_TABLET | Freq: Every day | ORAL | Status: DC

## 2013-11-08 NOTE — Progress Notes (Signed)
Subjective/Complaints: No new issues.  A  review of systems has been performed and if not noted above is otherwise negative. .  Objective: Vital Signs: Blood pressure 80/50, pulse 63, temperature 98.1 F (36.7 C), temperature source Oral, resp. rate 18, height 5' 2.99" (1.6 m), weight 43.6 kg (96 lb 1.9 oz), SpO2 100.00%. No results found. No results found for this basename: WBC, HGB, HCT, PLT,  in the last 72 hours No results found for this basename: NA, K, CL, CO, GLUCOSE, BUN, CREATININE, CALCIUM,  in the last 72 hours CBG (last 3)  No results found for this basename: GLUCAP,  in the last 72 hours  Wt Readings from Last 3 Encounters:  11/08/13 43.6 kg (96 lb 1.9 oz)  09/13/13 46.811 kg (103 lb 3.2 oz)  09/13/13 46.811 kg (103 lb 3.2 oz)    Physical Exam:  Constitutional:   Alert to restless, alternating, supine to sit as well as dual leg lifts, will not cooperate with MMT Eyes: Right eye exhibits no discharge. Left eye exhibits no discharge.  Neck:  [name] , stoma closed Cardiovascular: Normal rate and regular rhythm.  Respiratory: Effort normal.  GI: Soft. Stoma with scab no drainageNeurological:    Awake. Remains distracted. Makes eye contacts and does speaks now. Can be irritated Musc:  Left arm and leg pain, deformities.      Assessment/Plan: 1. 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 Physiatrist is providing close team supervision and 24 hour management of active medical problems listed below. Physiatrist and rehab team continue to assess barriers to discharge/monitor patient progress toward functional and medical goals.       FIM: FIM - Bathing Bathing Steps Patient Completed: Chest;Abdomen;Right upper leg;Left upper leg;Right Arm;Front perineal area;Right lower leg (including foot) Bathing: 3: Mod-Patient completes 5-7 [id number] 10 parts or  50-74%  FIM - Upper Body Dressing/Undressing Upper body dressing/undressing steps patient completed: Thread/unthread right sleeve of pullover shirt/dresss;Thread/unthread left sleeve of pullover shirt/dress;Put head through opening of pull over shirt/dress;Pull shirt over trunk Upper body dressing/undressing: 5: Set-up assist to: Obtain clothing/put away FIM - Lower Body Dressing/Undressing Lower body dressing/undressing steps patient completed: Thread/unthread right underwear leg;Thread/unthread right pants leg;Pull pants up/down;Don/Doff right sock;Don/Doff right shoe Lower body dressing/undressing: 2: Max-Patient completed 25-49% of tasks  FIM - Toileting Toileting steps completed by patient: Adjust clothing prior to toileting Toileting Assistive Devices: Grab bar or rail for support Toileting: 0: No continent bowel/bladder events this shift  FIM - Diplomatic Services operational officerToilet Transfers Toilet Transfers Assistive Devices: Grab bars Toilet Transfers: 4-From toilet/BSC: Min A (steadying Pt. > 75%);4-To toilet/BSC: Min A (steadying Pt. > 75%)  FIM - Bed/Chair Transfer Bed/Chair Transfer Assistive Devices: Arm rests Bed/Chair Transfer: 5: Sit > Supine: Supervision (verbal cues/safety issues);4: Bed > Chair or W/C: Min A (steadying Pt. > 75%);4: Chair or W/C > Bed: Min A (steadying Pt. > 75%)  FIM - Locomotion: Wheelchair Distance: 150 Locomotion: Wheelchair: 4: Travels 150 ft or more: maneuvers on rugs and over door sillls with minimal assistance (Pt.>75%) FIM - Locomotion: Ambulation Locomotion: Ambulation Assistive Devices: Other (comment) (+2 HHA) Ambulation/Gait Assistance: 2: Max assist Locomotion: Ambulation: 1: Travels less than 50 ft with maximal assistance (Pt: 25 - 49%)  Comprehension Comprehension Mode: Auditory Comprehension: 2-Understands basic 25 - 49% of the time/requires cueing 51 - 75% of the time  Expression Expression Mode: Verbal Expression: 2-Expresses basic 25 - 49% of the  time/requires cueing 50 - 75% of the time. Uses single words/gestures.  Social Interaction Social Interaction: 2-Interacts appropriately 25 - 49% of time - Needs frequent redirection.  Problem Solving Problem Solving: 1-Solves basic less than 25% of the time - needs direction nearly all the time or does not effectively solve problems and may need a restraint for safety  Memory Memory: 1-Recognizes or recalls less than 25% of the time/requires cueing greater than 75% of the time  Medical Problem List and Plan:  1. DVT Prophylaxis/Anticoagulation: Pharmaceutical: Lovenox  2. Pain Management: Prn oxycodone for outward signs of pain.  3. Mood: Currently too low level to evaluate.  4. Neuropsych: This patient is not capable of making decisions on his own behalf.   -stopped amantadine  -some episodic agitation  -decrease propranolol to 20mg  given hypotension 5. Recurrent Fever: afebrile    -wounds improving 6. Left tibial plateau Fx--s/p ORIF: PWB in KI (not really practical). ---essentially will be non-weight bearing 7. Left humerus Fx--s/p ORIF:   -HO and MO surrounding injury site   8. Leucocytosis: improving 9. Left shin and heel wound: Continue bilateral PRAFO.   Keflex initiated for coverage--dc 10. ABLA: stable 11. Dysphagia: Continue TF for supplement. Continue D2, nectars liquids with assistance.    12. Agitation/mood: seroquel-hs only, continue inderal (adjusted dose) and zoloft     -ativan prn.   -would like to move him to a hi-lo bed soon 13. FEN: good po appreciate dietician notes  -weight increased LOS (Days) 56 A FACE TO FACE EVALUATION WAS PERFORMED  Allyna Pittsley T 11/08/2013 8:05 AM

## 2013-11-08 NOTE — Progress Notes (Signed)
Occupational Therapy Session Note  Patient Details  Name: Cory [name] MRN: [id number] Date of Birth: 21-Apr-1981  Today's Date: 11/08/2013 Time: 900-945 Time Calculation (min): 45  min  Short Term Goals: No short term goals set STG= LTG  Skilled Therapeutic Interventions/Progress Updates:    1:1- self care retraining- Pt doff gown min (A). Pt ambulated to bathroom MOD (A) hand held (A). Pt incontinent of bladder . Pt doff diaper total (A). Pt transferred to shower min (A) using bars. See FIM for bathing. Pt requesting toilet transfer. Pt verbalized need to void but did not void once sitting on toilet. Pt complete grooming at sink level. Pt encouraged to stack blocks with RT UE. Pt required Therapist to help keep LT UE on w/c to engage RT UE. Pt picking up 6 blocks with hand over hand initiation for first 3 blocks. Pt don Lt shoe and crossing laces to start shoe tying.  Therapy Documentation Precautions:  Precautions Precautions: Fall Precaution Comments:  L LE KI d/c'd 11/07/13 per verbal orders from MD Required Braces or Orthoses: Knee Immobilizer - Left Knee Immobilizer - Left: On when out of bed or walking Cervical Brace: Hard collar;At all times Other Brace/Splint: Bil PRAFOs and L UE and LE splint can be removed for ROM with Ot/PT only.   Restrictions Weight Bearing Restrictions: Yes LUE Weight Bearing: Non weight bearing LLE Weight Bearing: Partial weight bearing LLE Partial Weight Bearing Percentage or Pounds: 50 General:   Vital Signs:   Pain: Premedicated by RN  ADL: ADL Eating: Minimal cueing;Minimal assistance Where Assessed-Eating: Wheelchair (sitting at [employer] station) Toileting: Moderate cueing;Minimal assistance Where Assessed-Toileting: Teacher, adult educationToilet Toilet Transfer: Minimal assistance;Moderate verbal cueing Toilet Transfer Method: Stand pivot :    See FIM for current functional status  Therapy/Group: Individual Therapy  Cory [name], Cory [name] 11/08/2013,  12:19 PM

## 2013-11-08 NOTE — Progress Notes (Signed)
Physical Therapy Discharge Summary  Patient Details  Name: Cory [name] MRN: [id number] Date of Birth: 1980-12-31  Today's Date: 11/08/2013 Time: 1000-1057 Time Calculation (min): 57 min  Patient has met 5 of 8 long term goals due to improved activity tolerance, improved balance, improved postural control, increased strength, increased range of motion, ability to compensate for deficits, functional use of  right upper extremity, right lower extremity, left upper extremity and left lower extremity, improved attention, improved awareness and improved coordination.  Patient to discharge at a wheelchair level minA for wheelchair mobility and transfers.   Patient's care partner not necessary secondary to patient discharging to SNF to provide the necessary physical and cognitive assistance at discharge.  Reasons goals not met: See care plan for details on unmet goals.  Recommendation:  Patient will benefit from ongoing skilled PT services in skilled nursing facility setting to continue to advance safe functional mobility, address ongoing impairments in cognition, attention, awareness, strength, balance, coordination, gait, and minimize fall risk.  Equipment: No equipment provided  Reasons for discharge: discharge from hospital  Patient/family agrees with progress made and goals achieved: Yes  PT Discharge Precautions/Restrictions Precautions Precautions: Fall Precaution Comments:  L LE KI d/c'd 11/07/13 per verbal orders from MD Restrictions Weight Bearing Restrictions: Yes LUE Weight Bearing: Non weight bearing LLE Weight Bearing: Partial weight bearing LLE Partial Weight Bearing Percentage or Pounds: 50 Pain Pain Assessment Pain Score: 0-No pain Faces Pain Scale: Hurts little more Pain Type: Acute pain Pain Location: Leg Pain Orientation: Left Pain Descriptors / Indicators: Aching Pain Onset: With Activity Pain Intervention(s): Repositioned;RN made aware;Ambulation/increased  activity Multiple Pain Sites: No Vision/Perception  Vision - History Baseline Vision: No visual deficits Patient Visual Report: Other (comment) (visual impairments present functionally; unable to test formally secondary to patient's cognitive status.) Vision - Assessment Eye Alignment: Impaired (comment) Additional Comments: Visual impairments present functionally; unable to test formally secondary to patient's cognitive status. Perception Perception: Impaired Inattention/Neglect: Does not attend to right visual field Praxis Praxis: Impaired Praxis Impairment Details: Motor planning;Initiation;Perseveration;Ideomotor  Cognition Overall Cognitive Status: Impaired/Different from baseline Arousal/Alertness: Awake/alert Orientation Level: Oriented to person;Oriented to place;Oriented to situation (with max cues for sustained attention) Attention: Focused;Sustained Focused Attention: Appears intact Sustained Attention: Impaired Sustained Attention Impairment: Verbal basic;Functional basic Memory: Impaired Memory Impairment: Decreased recall of new information;Decreased long term memory;Retrieval deficit;Prospective memory;Decreased short term memory;Storage deficit Decreased Long Term Memory: Verbal basic;Functional basic Decreased Short Term Memory: Functional basic;Verbal basic Awareness: Impaired Awareness Impairment: Intellectual impairment Problem Solving: Impaired Problem Solving Impairment: Verbal basic;Functional basic Executive Function: Reasoning;Self Monitoring;Sequencing;Self Correcting;Organizing;Decision Making;Initiating Reasoning: Impaired Reasoning Impairment: Verbal basic;Functional basic Sequencing: Impaired Sequencing Impairment: Verbal basic;Functional basic Organizing: Impaired Organizing Impairment: Verbal basic;Functional basic Decision Making: Impaired Decision Making Impairment: Verbal basic;Functional basic Initiating: Impaired Initiating Impairment:  Functional basic Self Monitoring: Impaired Self Monitoring Impairment: Verbal basic;Functional basic Self Correcting: Impaired Self Correcting Impairment: Verbal basic;Functional basic Behaviors: Poor frustration tolerance;Impulsive;Perseveration;Restless Safety/Judgment: Impaired [employer] Scales of Cognitive Functioning: Confused/inappropriate/non-agitated Sensation Sensation Light Touch: Impaired by gross assessment Proprioception: Impaired by gross assessment Additional Comments: Impairments based on functional mobility; Unable to formally assess secondary to cognitive deficits. Coordination Gross Motor Movements are Fluid and Coordinated: No Fine Motor Movements are Fluid and Coordinated: No Motor  Motor Motor: Hemiplegia;Abnormal postural alignment and control;Abnormal tone;Motor impersistence;Motor apraxia Motor - Discharge Observations: hemiplegia R side  Mobility Bed Mobility Bed Mobility: Supine to Sit;Sit to Supine;Sitting - Scoot to Edge of Bed Supine to Sit: HOB flat;5: Supervision Supine  to Sit Details: Tactile cues for initiation;Verbal cues for precautions/safety Sitting - Scoot to Edge of Bed Details: Verbal cues for precautions/safety;Tactile cues for initiation Sit to Supine: HOB flat;5: Supervision Sit to Supine - Details: Tactile cues for initiation;Verbal cues for precautions/safety Transfers Transfers: Yes Sit to Stand: 4: Min guard;With upper extremity assist;From bed;From chair/3-in-1;With armrests Stand to Sit: 4: Min guard;To chair/3-in-1;To bed;With armrests;With upper extremity assist Stand Pivot Transfers: 4: Min assist Stand Pivot Transfer Details: Verbal cues for precautions/safety;Tactile cues for initiation Locomotion  Ambulation Ambulation: Yes Ambulation/Gait Assistance: 2: Max assist Ambulation Distance (Feet): 30 Feet Assistive device: None Ambulation/Gait Assistance Details: Verbal cues for precautions/safety;Manual facilitation  for weight shifting;Manual facilitation for placement;Visual cues/gestures for precautions/safety Ambulation/Gait Assistance Details: Patient requires manual facilitation for weight shifts, manual facilitation for appropriate L step length. Patient does better with target to ambulate towards (chair, treatment mat). Gait Gait: Yes Gait Pattern: Impaired Gait Pattern: Step-to pattern;Step-through pattern;Shuffle;Decreased dorsiflexion - right;Decreased hip/knee flexion - right;Decreased stride length;Narrow base of support;Trunk flexed;Decreased stance time - left;Decreased step length - right Stairs / Additional Locomotion Stairs: Yes Stairs Assistance: 1: +2 Total assist Stairs Assistance Details: Visual cues/gestures for sequencing;Tactile cues for sequencing;Manual facilitation for weight shifting;Manual facilitation for placement;Verbal cues for precautions/safety;Verbal cues for sequencing Stair Management Technique: Two rails Number of Stairs: 5 Height of Stairs: 6 Wheelchair Mobility Wheelchair Mobility: Yes Wheelchair Assistance: 4: Advertising account executive Details: Verbal cues for precautions/safety;Visual cues/gestures for precautions/safety;Tactile cues for initiation Wheelchair Propulsion: Both lower extermities Wheelchair Parts Management: Needs assistance Distance: 150  Trunk/Postural Assessment  Cervical Assessment Cervical Assessment: Within Functional Limits Thoracic Assessment Thoracic Assessment: Within Functional Limits Lumbar Assessment Lumbar Assessment: Within Functional Limits Postural Control Postural Control: Deficits on evaluation Righting Reactions: delayed Protective Responses: delayed  Balance Balance Balance Assessed: Yes Static Sitting Balance Static Sitting - Balance Support: No upper extremity supported;Feet supported;Feet unsupported Static Sitting - Level of Assistance: 5: Stand by assistance (close stand by assistance) Dynamic Sitting  Balance Dynamic Sitting - Balance Support: No upper extremity supported;Feet supported;Feet unsupported;During functional activity (removing shoes) Dynamic Sitting - Level of Assistance: 5: Stand by assistance Static Standing Balance Static Standing - Balance Support: Bilateral upper extremity supported;During functional activity;No upper extremity supported Static Standing - Level of Assistance: 4: Min assist Static Standing - Comment/# of Minutes: 20" before patient makes impulsive movements to attempt to sit Extremity Assessment      RLE Assessment RLE Assessment: Exceptions to [facility] RLE Strength RLE Overall Strength: Deficits;Due to pain;Due to impaired cognition RLE Overall Strength Comments: Functionally, 2+/5 grossly. Formal MMT not able to be performed secondary to cognitive deficits LLE Assessment LLE Assessment: Exceptions to [facility] LLE Strength LLE Overall Strength: Deficits;Due to pain;Due to impaired cognition LLE Overall Strength Comments: Functionally, 2+/5 grossly. Formal MMT not able to be performed secondary to cognitive deficits  See FIM for current functional status  Lillia Abed. Verlin Uher, PT, DPT 11/08/2013, 12:10 PM

## 2013-11-08 NOTE — Plan of Care (Signed)
Problem: RH Bed to Chair Transfers Goal: LTG Patient will perform bed/chair transfers w/assist (PT) LTG: Patient will perform bed/chair transfers with assistance, with/without cues (PT).  Outcome: Not Met (add Reason) Patient continues to requires minA secondary to safety concerns and impulsivity.  Problem: RH Attention Goal: LTG Patient will demonstrate focused/sustained (PT) LTG: Patient will demonstrate focused/sustained/selective/alternating/divided attention during functional mobility in specific environment with assist for # of minutes (PT)  Outcome: Not Met (add Reason) Patient requires maxA for sustained attention.  Problem: RH Awareness Goal: LTG: Patient will demonstrate intellectual/emergent (PT) LTG: Patient will demonstrate intellectual/emergent/anticipatory awareness with assist during a mobility activity (PT)  Outcome: Not Met (add Reason) Patient continues maxA for intellectual awareness.

## 2013-11-08 NOTE — Progress Notes (Signed)
Recreational Therapy Discharge Summary Patient Details  Name: Cory [name] MRN: [id number] Date of Birth: 10/02/1981 Today's Date: 11/08/2013  Long term goals set: 1  Long term goals met: 1  Comments on progress toward goals: Pt is discharging to SNF today for 24 hour supervision/assist.  Pt requires max cues due to decreased cognition/safety & min assist (physical assist) for simple TR tasks seated w/c level.   Reasons for discharge: discharge from hospital Patient/family agrees with progress made and goals achieved: Yes  [name] 11/08/2013, 3:41 PM

## 2013-11-08 NOTE — Progress Notes (Signed)
Speech Language Pathology Session Note & Discharge Summary  Patient Details  Name: Cory [name] MRN: [id number] Date of Birth: December 22, 1980  Today's Date: 11/08/2013 Time: 0800-0900 Time Calculation (min): 60 min  Skilled Therapeutic Intervention: Skilled treatment focus on cognitive and dysphagia goals. SLP facilitated session by providing Mod-Max multimodal cueing for attention to self-feeding task due to pt's constant verbal perseveration. Pt also required Mod verbal and tactile cues for utilization of small bites/sips and demonstrated a wet vocal quality at end of meal that required Max verbal cues to self-monitor and correct. Pt independently verbalized pain and required total A for functional problem solving to request medication from RN.   Patient has met 4 of 4 long term goals.  Patient to discharge at [facility] Max;Total level.   Reasons goals not met: N/A   Clinical Impression/Discharge Summary: Pt has made functional gains and has met 4 of 4 LTG's this admission. Currently, pt is demonstrating behaviors consistent with a Rancho Level V and requires Max-Total A for orientation, initiation, sustained attention, functional problem solving, working memory, Surveyor, mining and intellectual awareness of deficits. Pt is currently verbally expressing his wants/needs but pt's verbal expression is characterized by perseveration with semantic and phonemic paraphasias, however, difficult to differentiate true language impairment  vs. cognitive impairment. Pt is also consuming Dys. 3 textures with thin liquids with full supervision and requires Mod-Max multimodal cueing for utilization of swallowing compensatory strategies due to decreased attention and overall impulsivity.  Pt demonstrates an intermittent wet vocal quality that can cleared with a cued throat clear. Pt will discharge to a SNF and would benefit from continued SLP intervention to maximize cognitive and swallowing function and functional  communication in order to maximize pt's overall functional independence.   Care Partner:  Caregiver Able to Provide Assistance: No  Type of Caregiver Assistance: Physical;Cognitive  Recommendation:  Skilled Nursing facility  Rationale for SLP Follow Up: Maximize functional communication;Maximize cognitive function and independence;Maximize swallowing safety;Reduce caregiver burden   Equipment: N/A   Reasons for discharge: Discharged from hospital;Treatment goals met   Patient/Family Agrees with Progress Made and Goals Achieved: Yes   See FIM for current functional status  Consuela Widener 11/08/2013, 11:57 AM

## 2013-11-08 NOTE — Progress Notes (Signed)
Social Work  Discharge Note  Received SNF bed offer this morning from [facility].  Contacted mother who is aware agreeable with pt's transfer this afternoon via ambulance.  She will complete admission paperwork via phone.  The overall goal for the admission was met for:   Discharge location: Yes - dc plan on admit was SNF  Length of Stay: No - difficult to place pt due to no payor source and extent of care needs - LOS=56 days  Discharge activity level: Yes - min - max assist overall but did make some unexpected gains while here!  Home/community participation: Yes  Services provided included: MD, RD, PT, OT, SLP, RN, TR, Pharmacy, Neuropsych and SW  Financial Services: Other: MA pending still at d/c  Follow-up services arranged: Other: SNF placement at [facility]  Comments (or additional information):  Patient/Family verbalized understanding of follow-up arrangements: Yes  Individual responsible for coordination of the follow-up plan: mother  Confirmed correct DME delivered: NA    Alyaan Budzynski

## 2013-11-08 NOTE — Progress Notes (Signed)
Pt was discharged and was picked up [facility]. [facility]

## 2013-11-08 NOTE — Progress Notes (Signed)
Recreational Therapy Session Note  Patient Details  Name: Cory [name] MRN: [id number] Date of Birth: [birth year] Today's Date: 11/08/2013  Skilled Therapeutic Interventions/Progress Updates: Session focused on activity tolerance, cognitive rehab, w/c mobility during TR/PT co-treat.  Pt requires max cues for cogntion for all therapeutic tasks.  Pt requires min assist & max cues for  w/c mobility using BUE's and for balance & simple TR tasks seated w/c level.  Therapy/Group: Co-Treatment   Refugio Mcconico 11/08/2013, 12:53 PM

## 2013-11-08 NOTE — Progress Notes (Signed)
Occupational Therapy Discharge Summary  Patient Details  Name: Cory [name] MRN: [id number] Date of Birth: 23-Feb-1981  Today's Date: 11/08/2013  Patient has met 4 of 9 long term goals due to improved activity tolerance, improved balance, functional use of  RIGHT upper, RIGHT lower, LEFT upper and LEFT lower extremity, improved coordination and improved alertness, ability to participate in formal ADL tasks.  Patient to discharge at overall min A physically for basic mobility and min to mod A for basic ADL due to cognition level. Currently, pt is demonstrating behaviors consistent with a Rancho Level V and requires Max-Total A for orientation, initiation, sustained attention, functional problem solving, working memory, Surveyor, mining and intellectual awareness of deficits. Pt is currently verbally expressing his wants/needs but pt's verbal expression is characterized by perseveration with semantic and phonemic paraphasias, however, difficult to differentiate true language impairment vs. cognitive impairment.   Patient's care partner unable to provide the necessary physical and cognitive assistance at discharge.    Reasons goals not met: Pt's level of Assistance varies with his cognition (attention to task, verbal perseveration ) with basic ADL tasks  Recommendation:  Patient will benefit from ongoing skilled OT services in skilled nursing facility setting to continue to advance functional skills in the area of BADL and Reduce care partner burden.  Equipment: No equipment provided  Reasons for discharge: discharge from hospital  Patient/family agrees with progress made and goals achieved: Yes  OT Discharge Precautions/Restrictions  Precautions Precautions: Fall Precaution Comments:  L LE KI d/c'd 11/07/13 per verbal orders from MD Restrictions Weight Bearing Restrictions: Yes LUE Weight Bearing: Non weight bearing LLE Weight Bearing: Weight bearing as tolerated LLE Partial Weight  Bearing Percentage or Pounds: 50 Pain Pain Assessment Pain Assessment: 0-10 Pain Score: 0-No pain Faces Pain Scale: Hurts little more Pain Type: Acute pain Pain Location: Leg Pain Orientation: Left Pain Descriptors / Indicators: Aching Pain Onset: With Activity Pain Intervention(s): Repositioned;RN made aware;Ambulation/increased activity Multiple Pain Sites: No ADL ADL Eating: Minimal cueing;Minimal assistance Where Assessed-Eating: Wheelchair (sitting at [employer] station) Toileting: Moderate cueing;Minimal assistance Where Assessed-Toileting: Glass blower/designer: Minimal assistance;Moderate verbal cueing Toilet Transfer Method: Stand pivot Vision/Perception  Vision - History Baseline Vision: No visual deficits Patient Visual Report: Other (comment) Vision - Assessment Eye Alignment: Impaired (comment) Vision Assessment: Vision impaired - to be further tested in functional context Additional Comments: Pt with field deficits in right field; difficulty perform full assessment due to congition Perception Perception: Impaired Inattention/Neglect: Does not attend to right visual field Praxis Praxis: Impaired Praxis Impairment Details: Ideation;Perseveration;Motor planning  Cognition Overall Cognitive Status: Impaired/Different from baseline Arousal/Alertness: Awake/alert Orientation Level: Other (comment);Oriented to place;Disoriented to time Attention: Focused;Sustained Focused Attention: Impaired Focused Attention Impairment: Functional basic Sustained Attention: Impaired Sustained Attention Impairment: Verbal basic;Functional basic Memory: Impaired Memory Impairment: Decreased recall of new information;Decreased long term memory;Retrieval deficit;Prospective memory;Decreased short term memory;Storage deficit Decreased Long Term Memory: Verbal basic;Functional basic Decreased Short Term Memory: Functional basic;Verbal basic Awareness: Impaired Awareness Impairment:  Intellectual impairment Problem Solving: Impaired Problem Solving Impairment: Verbal basic;Functional basic Executive Function: Reasoning;Self Monitoring;Sequencing;Self Correcting;Organizing;Decision Making;Initiating Reasoning: Impaired Reasoning Impairment: Verbal basic;Functional basic Sequencing: Impaired Sequencing Impairment: Verbal basic;Functional basic Organizing: Impaired Organizing Impairment: Verbal basic;Functional basic Decision Making: Impaired Decision Making Impairment: Verbal basic;Functional basic Initiating: Impaired Initiating Impairment: Functional basic Self Monitoring: Impaired Self Monitoring Impairment: Verbal basic;Functional basic Self Correcting: Impaired Self Correcting Impairment: Verbal basic;Functional basic Behaviors: Poor frustration tolerance;Impulsive;Perseveration;Restless Safety/Judgment: Impaired [employer] Scales of Cognitive Functioning: Confused/inappropriate/non-agitated Sensation Sensation Light  Touch: Impaired by gross assessment Proprioception: Impaired by gross assessment;Impaired Detail Proprioception Impaired Details: Impaired LLE Additional Comments: Impairments based on functional mobility; Unable to formally assess secondary to cognitive deficits. Coordination Gross Motor Movements are Fluid and Coordinated: No Fine Motor Movements are Fluid and Coordinated: No Motor  Motor Motor: Hemiplegia;Abnormal postural alignment and control;Abnormal tone;Motor impersistence;Motor apraxia Motor - Discharge Observations: hemiplegia R side Mobility  Bed Mobility Bed Mobility: Supine to Sit;Sit to Supine;Sitting - Scoot to Edge of Bed Supine to Sit: HOB flat;5: Supervision Supine to Sit Details: Tactile cues for initiation;Verbal cues for precautions/safety Sitting - Scoot to Edge of Bed: 5: Supervision Sitting - Scoot to [employer] of Bed Details: Verbal cues for precautions/safety;Tactile cues for initiation Sit to Supine: HOB  flat;5: Supervision Sit to Supine - Details: Tactile cues for initiation;Verbal cues for precautions/safety Transfers Transfers: Sit to Stand;Stand to Sit Sit to Stand: 5: Supervision;4: Min guard Stand to Sit: 5: Supervision;4: Min guard  Trunk/Postural Assessment  Cervical Assessment Cervical Assessment: Within Functional Limits Thoracic Assessment Thoracic Assessment: Within Functional Limits Lumbar Assessment Lumbar Assessment: Within Functional Limits Postural Control  Righting Reactions: delayed Protective Responses: delayed  Balance Balance Balance Assessed: Yes Static Sitting Balance Static Sitting - Balance Support: No upper extremity supported;Feet supported;Feet unsupported Static Sitting - Level of Assistance: 5: Stand by assistance (close stand by assistance) Dynamic Sitting Balance Dynamic Sitting - Balance Support: No upper extremity supported;Feet supported;Feet unsupported;During functional activity (removing shoes) Dynamic Sitting - Level of Assistance: 5: Stand by assistance Static Standing Balance Static Standing - Balance Support: Bilateral upper extremity supported;During functional activity;No upper extremity supported Static Standing - Level of Assistance: 4: Min assist Static Standing - Comment/# of Minutes: 20" before patient makes impulsive movements to attempt to sit Dynamic Standing Balance Dynamic Standing - Balance Support: During functional activity Dynamic Standing - Level of Assistance: 4: Min assist Extremity/Trunk Assessment RUE Assessment RUE Assessment: Exceptions to WFL (Brunstrom V) RUE Strength RUE Overall Strength:  (3/5) RUE Tone RUE Tone: Mild LUE Assessment LUE Assessment: Within Functional Limits (ROM 0-110)  See FIM for current functional status  Willeen Cass [facility] 11/08/2013, 2:58 PM

## 2013-11-08 NOTE — Progress Notes (Signed)
Nursing Note: BP 80/50 taken manually and p-63.Pt assympt.A: Paged on-call-Pam Love ,Pa and order to hold dose for this am.wbb
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
## 2013-11-13 ENCOUNTER — Non-Acute Institutional Stay (SKILLED_NURSING_FACILITY): Payer: Medicaid Other | Admitting: Internal Medicine

## 2013-11-13 DIAGNOSIS — S069XAA Unspecified intracranial injury with loss of consciousness status unknown, initial encounter: Secondary | ICD-10-CM

## 2013-11-13 DIAGNOSIS — S069X9A Unspecified intracranial injury with loss of consciousness of unspecified duration, initial encounter: Secondary | ICD-10-CM
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
## 2013-11-14 ENCOUNTER — Ambulatory Visit (HOSPITAL_COMMUNITY): Payer: Medicaid - Out of State | Attending: Internal Medicine

## 2013-11-14 DIAGNOSIS — X58XXXA Exposure to other specified factors, initial encounter: Secondary | ICD-10-CM | POA: Insufficient documentation

## 2013-11-14 DIAGNOSIS — M25519 Pain in unspecified shoulder: Secondary | ICD-10-CM | POA: Insufficient documentation

## 2013-11-17 ENCOUNTER — Non-Acute Institutional Stay (SKILLED_NURSING_FACILITY): Payer: Medicaid Other | Admitting: Internal Medicine

## 2013-11-17 DIAGNOSIS — S82109A Unspecified fracture of upper end of unspecified tibia, initial encounter for closed fracture: Secondary | ICD-10-CM

## 2013-11-17 DIAGNOSIS — S42412K Displaced simple supracondylar fracture without intercondylar fracture of left humerus, subsequent encounter for fracture with nonunion: Secondary | ICD-10-CM

## 2013-11-17 DIAGNOSIS — I82611 Acute embolism and thrombosis of superficial veins of right upper extremity: Secondary | ICD-10-CM

## 2013-11-17 DIAGNOSIS — T148XXA Other injury of unspecified body region, initial encounter: Secondary | ICD-10-CM

## 2013-11-17 DIAGNOSIS — S069XAA Unspecified intracranial injury with loss of consciousness status unknown, initial encounter: Secondary | ICD-10-CM

## 2013-11-17 DIAGNOSIS — D62 Acute posthemorrhagic anemia: Secondary | ICD-10-CM

## 2013-11-17 DIAGNOSIS — I82619 Acute embolism and thrombosis of superficial veins of unspecified upper extremity: Secondary | ICD-10-CM

## 2013-11-17 DIAGNOSIS — IMO0002 Reserved for concepts with insufficient information to code with codable children: Secondary | ICD-10-CM

## 2013-11-17 DIAGNOSIS — S069X9A Unspecified intracranial injury with loss of consciousness of unspecified duration, initial encounter: Secondary | ICD-10-CM

## 2013-11-17 DIAGNOSIS — S82143A Displaced bicondylar fracture of unspecified tibia, initial encounter for closed fracture: Secondary | ICD-10-CM
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
## 2013-11-20 ENCOUNTER — Other Ambulatory Visit: Payer: Self-pay | Admitting: *Deleted

## 2013-11-20 MED ORDER — TRAMADOL HCL 50 MG PO TABS: ORAL_TABLET | ORAL | Status: DC

## 2013-11-22 DIAGNOSIS — S069XAS Unspecified intracranial injury with loss of consciousness status unknown, sequela: Secondary | ICD-10-CM

## 2013-11-22 DIAGNOSIS — S069X9S Unspecified intracranial injury with loss of consciousness of unspecified duration, sequela: Secondary | ICD-10-CM

## 2013-11-22 DIAGNOSIS — S42309D Unspecified fracture of shaft of humerus, unspecified arm, subsequent encounter for fracture with routine healing: Secondary | ICD-10-CM

## 2013-11-22 DIAGNOSIS — S91309A Unspecified open wound, unspecified foot, initial encounter: Secondary | ICD-10-CM

## 2013-11-22 DIAGNOSIS — IMO0002 Reserved for concepts with insufficient information to code with codable children: Secondary | ICD-10-CM

## 2013-11-23 ENCOUNTER — Emergency Department (HOSPITAL_COMMUNITY)
Admission: EM | Admit: 2013-11-23 | Discharge: 2013-11-24 | Disposition: A | Discharge status: Home / Self Care | Payer: Medicaid Other | Attending: Emergency Medicine | Admitting: Emergency Medicine

## 2013-11-23 ENCOUNTER — Encounter (HOSPITAL_COMMUNITY): Payer: Self-pay | Admitting: Emergency Medicine

## 2013-11-23 DIAGNOSIS — S069XAA Unspecified intracranial injury with loss of consciousness status unknown, initial encounter: Secondary | ICD-10-CM

## 2013-11-23 DIAGNOSIS — F3289 Other specified depressive episodes: Secondary | ICD-10-CM | POA: Insufficient documentation

## 2013-11-23 DIAGNOSIS — F17200 Nicotine dependence, unspecified, uncomplicated: Secondary | ICD-10-CM | POA: Insufficient documentation

## 2013-11-23 DIAGNOSIS — Z79899 Other long term (current) drug therapy: Secondary | ICD-10-CM | POA: Insufficient documentation

## 2013-11-23 DIAGNOSIS — S069X9A Unspecified intracranial injury with loss of consciousness of unspecified duration, initial encounter: Secondary | ICD-10-CM

## 2013-11-23 DIAGNOSIS — F329 Major depressive disorder, single episode, unspecified: Secondary | ICD-10-CM | POA: Insufficient documentation

## 2013-11-23 DIAGNOSIS — Z09 Encounter for follow-up examination after completed treatment for conditions other than malignant neoplasm: Secondary | ICD-10-CM | POA: Insufficient documentation

## 2013-11-23 HISTORY — DX: Unspecified intracranial injury with loss of consciousness of unspecified duration, initial encounter: S06.9X9A

## 2013-11-23 HISTORY — DX: Unspecified intracranial injury with loss of consciousness status unknown, initial encounter: S06.9XAA

## 2013-11-23 LAB — CBC
HEMATOCRIT: 35.7 % — AB (ref 39.0–52.0)
Hemoglobin: 12 g/dL — ABNORMAL LOW (ref 13.0–17.0)
MCH: 30.5 pg (ref 26.0–34.0)
MCHC: 33.6 g/dL (ref 30.0–36.0)
MCV: 90.8 fL (ref 78.0–100.0)
Platelets: 159 10*3/uL (ref 150–400)
RBC: 3.93 MIL/uL — ABNORMAL LOW (ref 4.22–5.81)
RDW: 14.7 % (ref 11.5–15.5)
WBC: 7.1 10*3/uL (ref 4.0–10.5)

## 2013-11-23 MED ORDER — DOCUSATE SODIUM 283 MG RE ENEM
1.0000 | ENEMA | Freq: Every day | RECTAL | Status: DC
  Administered 2013-11-24: 283 mg via RECTAL
  Filled 2013-11-23: qty 1

## 2013-11-23 MED ORDER — SACCHAROMYCES BOULARDII 250 MG PO CAPS
500.0000 mg | ORAL_CAPSULE | Freq: Two times a day (BID) | ORAL | Status: DC
  Administered 2013-11-24: 500 mg via ORAL
  Filled 2013-11-23 (×3): qty 2

## 2013-11-23 MED ORDER — OXYCODONE HCL 5 MG PO TABS: 5.0000 mg | ORAL_TABLET | ORAL | Status: DC | PRN

## 2013-11-23 MED ORDER — QUETIAPINE FUMARATE 25 MG PO TABS: 25.0000 mg | ORAL_TABLET | Freq: Every day | ORAL | Status: DC

## 2013-11-23 MED ORDER — SERTRALINE HCL 50 MG PO TABS
50.0000 mg | ORAL_TABLET | Freq: Every day | ORAL | Status: DC
  Administered 2013-11-24: 50 mg via ORAL
  Filled 2013-11-23: qty 1

## 2013-11-23 MED ORDER — DICLOFENAC SODIUM 1 % TD GEL
2.0000 g | Freq: Three times a day (TID) | TRANSDERMAL | Status: DC
  Administered 2013-11-24: 2 g via TOPICAL
  Filled 2013-11-23: qty 100

## 2013-11-23 MED ORDER — DIPHENHYDRAMINE-APAP (SLEEP) 25-500 MG PO TABS: 1.0000 | ORAL_TABLET | Freq: Every evening | ORAL | Status: DC | PRN

## 2013-11-23 MED ORDER — ACETAMINOPHEN 325 MG PO TABS: 325.0000 mg | ORAL_TABLET | ORAL | Status: DC | PRN

## 2013-11-23 MED ORDER — PROPRANOLOL HCL 20 MG PO TABS
20.0000 mg | ORAL_TABLET | Freq: Three times a day (TID) | ORAL | Status: DC
  Administered 2013-11-24: 20 mg via ORAL
  Filled 2013-11-23 (×4): qty 1

## 2013-11-23 MED ORDER — SODIUM CHLORIDE 0.9 % IV SOLN
INTRAVENOUS | Status: DC
Start: 2013-11-23 — End: 2013-11-23

## 2013-11-23 MED ORDER — METHOCARBAMOL 500 MG PO TABS: 500.0000 mg | ORAL_TABLET | Freq: Four times a day (QID) | ORAL | Status: DC | PRN

## 2013-11-23 MED ORDER — ARIPIPRAZOLE 2 MG PO TABS
2.0000 mg | ORAL_TABLET | Freq: Every day | ORAL | Status: DC
  Administered 2013-11-24: 2 mg via ORAL
  Filled 2013-11-23: qty 1

## 2013-11-23 MED ORDER — HYDROCERIN EX CREA
1.0000 "application " | TOPICAL_CREAM | Freq: Two times a day (BID) | CUTANEOUS | Status: DC
  Administered 2013-11-24: 1 via TOPICAL
  Filled 2013-11-23: qty 113

## 2013-11-23 MED ORDER — NAPHAZOLINE-PHENIRAMINE 0.025-0.3 % OP SOLN
2.0000 [drp] | Freq: Three times a day (TID) | OPHTHALMIC | Status: DC
  Administered 2013-11-24: 2 [drp] via OPHTHALMIC
  Filled 2013-11-23: qty 15
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
## 2013-11-23 NOTE — ED Notes (Signed)
Bed: [id number] Expected date:  Expected time:  Means of arrival:  Comments: EMS 33yo M hx of TBI family requesting to find placement

## 2013-11-24 LAB — BASIC METABOLIC PANEL
BUN: 9 mg/dL (ref 6–23)
CHLORIDE: 97 meq/L (ref 96–112)
CO2: 30 mEq/L (ref 19–32)
Calcium: 8.6 mg/dL (ref 8.4–10.5)
Creatinine, Ser: 0.66 mg/dL (ref 0.50–1.35)
GFR calc non Af Amer: >90 mL/min (ref >90)
Glucose, Bld: 84 mg/dL (ref 70–99)
Potassium: 4.3 mEq/L (ref 3.7–5.3)
Sodium: 137 mEq/L (ref 137–147)

## 2013-11-24 MED ORDER — DIPHENHYDRAMINE HCL 25 MG PO CAPS: 25.0000 mg | ORAL_CAPSULE | Freq: Every evening | ORAL | Status: DC | PRN

## 2013-11-24 MED ORDER — ACETAMINOPHEN 500 MG PO TABS: 500.0000 mg | ORAL_TABLET | Freq: Every evening | ORAL | Status: DC | PRN
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
## 2013-11-24 NOTE — Discharge Instructions (Signed)
Follow up with your primary care doctor as scheduled for ongoing management.

## 2013-11-24 NOTE — ED Provider Notes (Signed)
Has been seen by social services and case Production designer, theatre/television/filmmanager. They arranged for in-home health services. The mother was comfortable with this decision and is ready to have him come home. Arrangements for ongoing care, had been made with his PCP, Dr. Susie CassetteAbrol.  Flint MelterElliott L Lela Murfin, MD 2013/12/28 1620
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
## 2013-11-24 NOTE — ED Notes (Signed)
Pt awake  Pt is clean and dry  Repositioned  Comfort measures provided

## 2013-11-24 NOTE — ED Notes (Signed)
Pt to d/c home.  

## 2013-11-24 NOTE — ED Notes (Signed)
Pt speaking in short sentences.  Keeps saying please.  Pt eating well.

## 2013-11-25 ENCOUNTER — Encounter (HOSPITAL_COMMUNITY): Payer: Self-pay | Admitting: Emergency Medicine

## 2013-11-25 ENCOUNTER — Telehealth (HOSPITAL_COMMUNITY): Payer: Self-pay | Admitting: *Deleted

## 2013-11-25 ENCOUNTER — Emergency Department (HOSPITAL_COMMUNITY)
Admission: EM | Admit: 2013-11-25 | Discharge: 2013-11-25 | Disposition: A | Discharge status: Home / Self Care | Payer: Medicaid Other | Attending: Emergency Medicine | Admitting: Emergency Medicine

## 2013-11-25 DIAGNOSIS — G8928 Other chronic postprocedural pain: Secondary | ICD-10-CM | POA: Insufficient documentation

## 2013-11-25 DIAGNOSIS — K5900 Constipation, unspecified: Secondary | ICD-10-CM

## 2013-11-25 DIAGNOSIS — F17200 Nicotine dependence, unspecified, uncomplicated: Secondary | ICD-10-CM | POA: Insufficient documentation

## 2013-11-25 DIAGNOSIS — R451 Restlessness and agitation: Secondary | ICD-10-CM

## 2013-11-25 DIAGNOSIS — F329 Major depressive disorder, single episode, unspecified: Secondary | ICD-10-CM | POA: Insufficient documentation

## 2013-11-25 DIAGNOSIS — Y939 Activity, unspecified: Secondary | ICD-10-CM | POA: Insufficient documentation

## 2013-11-25 DIAGNOSIS — Z79899 Other long term (current) drug therapy: Secondary | ICD-10-CM | POA: Insufficient documentation

## 2013-11-25 DIAGNOSIS — W19XXXA Unspecified fall, initial encounter: Secondary | ICD-10-CM | POA: Insufficient documentation

## 2013-11-25 DIAGNOSIS — S069XAA Unspecified intracranial injury with loss of consciousness status unknown, initial encounter: Secondary | ICD-10-CM

## 2013-11-25 DIAGNOSIS — Y929 Unspecified place or not applicable: Secondary | ICD-10-CM | POA: Insufficient documentation

## 2013-11-25 DIAGNOSIS — IMO0002 Reserved for concepts with insufficient information to code with codable children: Secondary | ICD-10-CM | POA: Insufficient documentation

## 2013-11-25 DIAGNOSIS — S069X9A Unspecified intracranial injury with loss of consciousness of unspecified duration, initial encounter: Secondary | ICD-10-CM

## 2013-11-25 DIAGNOSIS — F3289 Other specified depressive episodes: Secondary | ICD-10-CM | POA: Insufficient documentation

## 2013-11-25 LAB — CBC
HCT: 39.1 % (ref 39.0–52.0)
Hemoglobin: 13 g/dL (ref 13.0–17.0)
MCH: 30 pg (ref 26.0–34.0)
MCHC: 33.2 g/dL (ref 30.0–36.0)
MCV: 90.3 fL (ref 78.0–100.0)
PLATELETS: 182 10*3/uL (ref 150–400)
RBC: 4.33 MIL/uL (ref 4.22–5.81)
RDW: 14.6 % (ref 11.5–15.5)
WBC: 8.8 10*3/uL (ref 4.0–10.5)

## 2013-11-25 LAB — COMPREHENSIVE METABOLIC PANEL
ALK PHOS: 141 U/L — AB (ref 39–117)
ALT: 15 U/L (ref 0–53)
AST: 17 U/L (ref 0–37)
Albumin: 3.8 g/dL (ref 3.5–5.2)
BILIRUBIN TOTAL: 0.3 mg/dL (ref 0.3–1.2)
BUN: 9 mg/dL (ref 6–23)
CHLORIDE: 97 meq/L (ref 96–112)
CO2: 31 mEq/L (ref 19–32)
Calcium: 8.7 mg/dL (ref 8.4–10.5)
Creatinine, Ser: 0.72 mg/dL (ref 0.50–1.35)
GFR calc Af Amer: >90 mL/min (ref >90)
GFR calc non Af Amer: >90 mL/min (ref >90)
Glucose, Bld: 75 mg/dL (ref 70–99)
POTASSIUM: 4.1 meq/L (ref 3.7–5.3)
Sodium: 139 mEq/L (ref 137–147)
Total Protein: 9.1 g/dL — ABNORMAL HIGH (ref 6.0–8.3)

## 2013-11-25 LAB — SALICYLATE LEVEL

## 2013-11-25 LAB — ACETAMINOPHEN LEVEL: Acetaminophen (Tylenol), Serum: <15 ug/mL (ref 10–30)

## 2013-11-25 LAB — ETHANOL: Alcohol, Ethyl (B): <11 mg/dL (ref 0–11)

## 2013-11-25 MED ORDER — DOCUSATE SODIUM 100 MG PO CAPS: 100.0000 mg | ORAL_CAPSULE | Freq: Two times a day (BID) | ORAL | Status: DC

## 2013-11-25 MED ORDER — ARIPIPRAZOLE 5 MG PO TABS
5.0000 mg | ORAL_TABLET | Freq: Once | ORAL | Status: AC
  Administered 2013-11-25: 5 mg via ORAL
  Filled 2013-11-25: qty 1

## 2013-11-25 MED ORDER — ARIPIPRAZOLE 5 MG PO TABS: 5.0000 mg | ORAL_TABLET | Freq: Every day | ORAL | Status: DC

## 2013-11-25 MED ORDER — POLYETHYLENE GLYCOL 3350 17 G PO PACK: 17.0000 g | PACK | Freq: Every day | ORAL | Status: DC | PRN

## 2013-11-25 NOTE — ED Notes (Signed)
Pt mother also reporting that the patient has an open sore to the left foot.

## 2013-11-25 NOTE — ED Notes (Signed)
Patient currently resting. Will attempt urine once awake.
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
## 2013-11-25 NOTE — Discharge Instructions (Signed)
Decrease narcotic medicines for pain. Increase stool softeners (colace and miralax) as needed until stool soft. Follow up as directed by social work and psychiatry to arrange outpatient placement. Stop taking your 2 mg abilify, starting tomorrow take 5 mg abilify once daily.  If you were given medicines take as directed.  If you are on coumadin or contraceptives realize their levels and effectiveness is altered by many different medicines.  If you have any reaction (rash, tongues swelling, other) to the medicines stop taking and see a physician.   Please follow up as directed and return to the ER or see a physician for new or worsening symptoms.  Thank you.

## 2013-11-25 NOTE — ED Notes (Signed)
MD at bedside. 

## 2013-11-25 NOTE — Progress Notes (Addendum)
CSW met with pt and pt mother. CSW informed pt mother that pt could NOT be placed into SNF from the ED--HH would be able to assist with this.   Pt mother expressed concern over being able to buy pt's new medications. CSW consulted CM for medication assistance.  Pt mother stated a friend from home, Olivia Mackie, would be able to assist in caring for pt. Per mother, Olivia Mackie has a son with autism, and pt's mother has helped [place] care for her son so now "she is able to help me."   CSW provided supportive counseling.  Rochele Pages,     ED CSW  phone: [phone]
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
## 2013-11-25 NOTE — ED Notes (Signed)
Please call mother Cory [name](Alicia) once MD or Social work at bedside. [phone].

## 2013-11-25 NOTE — ED Notes (Signed)
Case worker is workimg on pts plan of care

## 2013-11-25 NOTE — Consult Note (Signed)
Spoke to nurse and will let SW know pt is a placement issue.  Mother reportedly took out of a care facility and discovered she cannot care for pt and needs help with placement  SW to get involved
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
## 2013-11-25 NOTE — Progress Notes (Signed)
   CARE MANAGEMENT NOTE 11/25/2013  Patient:  Cory [name],Cory [name]   Account Number:  [id number]  Date Initiated:  11/25/2013  Documentation [facility]:  [facility] Terr[facility]  Subjective/Objective Assessment:     Action/Plan:   lives at home with Mother, Tommi Rumpslicia Rodda   Anticipated DC Date:  11/25/2013   Anticipated DC Plan:  HOME W HOME HEALTH SERVICES      DC Planning Services  CM consult  MATCH Program  Medication Assistance      Choice offered to / List presented to:             Status of service:  Completed, signed off Medicare Important Message given?   (If response is "NO", the following Medicare IM given date fields will be blank) Date Medicare IM given:   Date Additional Medicare IM given:    Discharge Disposition:  HOME W HOME HEALTH SERVICES  Per UR Regulation:    If discussed at Long Length of Stay Meetings, dates discussed:    Comments:  11/25/2013 1230 NCM spoke to pt's mother, Tommi Rumpslicia Colan. Reports he lives at home with her and she is having difficulty with getting pt medication. Provided her MATCH letter and pt [facility]. They will be able to assist further with medications. Pt will receive a 30 day supply of meds for $3.00 copay. Mother states she can afford $3.00. Tried to speak with mother about meds and assistance at home. Mother states she was very tired and has not slept. Insisting on dc so she can go home to rest. [facility]. Isidoro DonningAlesia Vahe Pienta RN CCM Case Mgmt [phone]

## 2013-11-25 NOTE — ED Notes (Signed)
Mother calling to try to get help with getting pt's abilify rx.

## 2013-11-26 ENCOUNTER — Telehealth (HOSPITAL_BASED_OUTPATIENT_CLINIC_OR_DEPARTMENT_OTHER): Payer: Self-pay | Admitting: Emergency Medicine

## 2013-11-26 DEATH — deceased

## 2013-11-27 NOTE — Progress Notes (Signed)
11/27/2013 1125 [facility] [facility] and [facility] [place] terminated on 10/25/2014. Cory DonningAlesia Penelope Fittro RN CCM Case Mgmt phone [phone]

## 2013-11-27 NOTE — Progress Notes (Addendum)
   CARE MANAGEMENT NOTE 11/27/2013  Patient:  Cory [name],Cory [name]   Account Number:  [id number]  Date Initiated:  11/25/2013  Documentation initiated by:  [facility]Tameria Patti  Subjective/Objective Assessment:     Action/Plan:   lives at home with Mother, Cory [name]   Anticipated DC Date:  11/25/2013   Anticipated DC Plan:  HOME W HOME HEALTH SERVICES      DC Planning Services  CM consult  MATCH Program  Medication Assistance      Choice offered to / List presented to:             Status of service:  Completed, signed off Medicare Important Message given?   (If response is "NO", the following Medicare IM given date fields will be blank) Date Medicare IM given:   Date Additional Medicare IM given:    Discharge Disposition:  HOME W HOME HEALTH SERVICES  Per UR Regulation:    If discussed at Long Length of Stay Meetings, dates discussed:    Comments:  11/27/2013 1110 NCM contacted mother, Cory [name] and explained medication [facility] She can pick up from pharmacy. Explained [employer] has an assistance program that pt may get medication for free if he qualifies. She would need to complete application. NCM mailed application to mother's address and explained she will need to complete and take with her to his app[facility]. Explained to mother she needs to follow up with [place] Medicaid to see if it is still active and that [employer] may reject if he has insurance.    Isidoro DonningAlesia Otha Monical RN CCM Case Mgmt phone [phone]   11/26/2013 1100 NCM received a call from mother stating [facility] could not approve medication due to cost. Contacted [facility] # [phone]. Explained to mother NCM will follow up with her on 11/27/2013 for approval of medication through [facility]. Faxed corrected MATCH letter to [employer] [facility] with correct dates. Isidoro DonningAlesia Ninfa Giannelli RN CCM Case Mgmt phone [phone]  11/25/2013 1230 NCM spoke to pt's mother, Cory [name]. Reports he lives  at home with her and she is having difficulty with getting pt medication. Provided her MATCH letter and pt [facility]. They will be able to assist further with medications. Pt will receive a 30 day supply of meds for $3.00 copay. Mother states she can afford $3.00. Tried to speak with mother about meds and assistance at home. Mother states she was very tired and has not slept. Insisting on dc so she can go home to rest. [facility]. Isidoro DonningAlesia Jaivion Kingsley RN CCM Case Mgmt [phone]

## 2013-11-28 ENCOUNTER — Ambulatory Visit: Payer: Medicaid - Out of State

## 2013-12-13 ENCOUNTER — Encounter: Payer: Medicaid Other | Admitting: Physical Medicine & Rehabilitation

## 2013-12-18 ENCOUNTER — Ambulatory Visit: Payer: Medicaid - Out of State

## 2013-12-21 ENCOUNTER — Ambulatory Visit: Payer: Medicaid - Out of State

## 2013-12-26 ENCOUNTER — Ambulatory Visit: Payer: Medicaid Other

## 2013-12-27 ENCOUNTER — Ambulatory Visit: Payer: Medicaid Other | Attending: Internal Medicine | Admitting: Internal Medicine

## 2013-12-27 VITALS — BP 128/86 | HR 84 | Temp 98.1°F | Resp 16

## 2013-12-27 DIAGNOSIS — F17200 Nicotine dependence, unspecified, uncomplicated: Secondary | ICD-10-CM | POA: Insufficient documentation

## 2013-12-27 DIAGNOSIS — F329 Major depressive disorder, single episode, unspecified: Secondary | ICD-10-CM | POA: Insufficient documentation

## 2013-12-27 DIAGNOSIS — F3289 Other specified depressive episodes: Secondary | ICD-10-CM | POA: Insufficient documentation

## 2013-12-27 DIAGNOSIS — S069X9A Unspecified intracranial injury with loss of consciousness of unspecified duration, initial encounter: Secondary | ICD-10-CM | POA: Insufficient documentation

## 2013-12-27 DIAGNOSIS — F411 Generalized anxiety disorder: Secondary | ICD-10-CM | POA: Insufficient documentation

## 2013-12-27 DIAGNOSIS — G894 Chronic pain syndrome: Secondary | ICD-10-CM

## 2013-12-27 DIAGNOSIS — Z993 Dependence on wheelchair: Secondary | ICD-10-CM | POA: Insufficient documentation

## 2013-12-27 DIAGNOSIS — S069XAA Unspecified intracranial injury with loss of consciousness status unknown, initial encounter: Secondary | ICD-10-CM | POA: Insufficient documentation

## 2013-12-27 DIAGNOSIS — R32 Unspecified urinary incontinence: Secondary | ICD-10-CM | POA: Insufficient documentation

## 2013-12-27 DIAGNOSIS — Z87820 Personal history of traumatic brain injury: Secondary | ICD-10-CM | POA: Insufficient documentation

## 2013-12-27 MED ORDER — ALPRAZOLAM 0.25 MG PO TABS: 0.2500 mg | ORAL_TABLET | Freq: Every evening | ORAL | Status: DC | PRN

## 2013-12-27 MED ORDER — METHOCARBAMOL 500 MG PO TABS: 500.0000 mg | ORAL_TABLET | Freq: Three times a day (TID) | ORAL | Status: DC

## 2013-12-27 MED ORDER — ACETAMINOPHEN-CODEINE #3 300-30 MG PO TABS: 1.0000 | ORAL_TABLET | ORAL | Status: DC | PRN
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
## 2013-12-29 ENCOUNTER — Telehealth: Payer: Self-pay | Admitting: Internal Medicine

## 2013-12-29 NOTE — Telephone Encounter (Signed)
Pt's mom called regarding a refill of his medication, please contact pt as soon as possible

## 2013-12-29 NOTE — Telephone Encounter (Signed)
Pt's mom calling because they forgot to mention during office visit earlier this week that pt needs refill for Seroquel and Abilify.    Also pt needs order for home health care nurse, says Nicki Guadalajaraywan Lindsay has necessary paperwork.  Please f/u with pt's mom.

## 2013-12-29 NOTE — Telephone Encounter (Signed)
Can you please address? I can f/u and prescribe medication
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
## 2014-01-11 ENCOUNTER — Ambulatory Visit: Payer: Medicaid - Out of State | Admitting: Physical Medicine and Rehabilitation

## 2014-01-22 ENCOUNTER — Encounter: Payer: Self-pay | Admitting: Internal Medicine

## 2014-01-22 ENCOUNTER — Ambulatory Visit: Payer: Medicaid Other | Attending: Internal Medicine | Admitting: Internal Medicine

## 2014-01-22 VITALS — BP 104/66 | HR 63 | Temp 98.1°F | Wt 133.2 lb

## 2014-01-22 DIAGNOSIS — F329 Major depressive disorder, single episode, unspecified: Secondary | ICD-10-CM | POA: Insufficient documentation

## 2014-01-22 DIAGNOSIS — G894 Chronic pain syndrome: Secondary | ICD-10-CM

## 2014-01-22 DIAGNOSIS — G4700 Insomnia, unspecified: Secondary | ICD-10-CM

## 2014-01-22 DIAGNOSIS — F411 Generalized anxiety disorder: Secondary | ICD-10-CM

## 2014-01-22 DIAGNOSIS — S069X9A Unspecified intracranial injury with loss of consciousness of unspecified duration, initial encounter: Secondary | ICD-10-CM

## 2014-01-22 DIAGNOSIS — S069XAA Unspecified intracranial injury with loss of consciousness status unknown, initial encounter: Secondary | ICD-10-CM

## 2014-01-22 DIAGNOSIS — F3289 Other specified depressive episodes: Secondary | ICD-10-CM

## 2014-01-22 DIAGNOSIS — F32A Depression, unspecified: Secondary | ICD-10-CM

## 2014-01-22 DIAGNOSIS — S0990XA Unspecified injury of head, initial encounter: Secondary | ICD-10-CM

## 2014-01-22 MED ORDER — ACETAMINOPHEN-CODEINE #3 300-30 MG PO TABS: 1.0000 | ORAL_TABLET | ORAL | Status: DC | PRN

## 2014-01-22 MED ORDER — QUETIAPINE FUMARATE 25 MG PO TABS: 25.0000 mg | ORAL_TABLET | Freq: Every day | ORAL | Status: DC

## 2014-01-22 MED ORDER — CLONAZEPAM 0.5 MG PO TABS: 0.5000 mg | ORAL_TABLET | Freq: Every day | ORAL | Status: DC

## 2014-01-22 MED ORDER — ARIPIPRAZOLE 5 MG PO TABS: 5.0000 mg | ORAL_TABLET | Freq: Every day | ORAL | Status: DC

## 2014-01-22 MED ORDER — SERTRALINE HCL 50 MG PO TABS: 50.0000 mg | ORAL_TABLET | Freq: Every day | ORAL | Status: DC

## 2014-01-22 MED ORDER — METHOCARBAMOL 500 MG PO TABS: 500.0000 mg | ORAL_TABLET | Freq: Three times a day (TID) | ORAL | Status: DC
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
## 2014-01-24 ENCOUNTER — Other Ambulatory Visit: Payer: Self-pay | Admitting: *Deleted

## 2014-01-24 DIAGNOSIS — F329 Major depressive disorder, single episode, unspecified: Secondary | ICD-10-CM

## 2014-01-24 DIAGNOSIS — S069X9A Unspecified intracranial injury with loss of consciousness of unspecified duration, initial encounter: Secondary | ICD-10-CM

## 2014-01-24 DIAGNOSIS — S0990XA Unspecified injury of head, initial encounter: Principal | ICD-10-CM

## 2014-01-24 DIAGNOSIS — G894 Chronic pain syndrome: Secondary | ICD-10-CM

## 2014-01-24 DIAGNOSIS — S069XAA Unspecified intracranial injury with loss of consciousness status unknown, initial encounter: Secondary | ICD-10-CM

## 2014-01-24 DIAGNOSIS — F32A Depression, unspecified: Secondary | ICD-10-CM

## 2014-01-24 MED ORDER — SERTRALINE HCL 50 MG PO TABS: 50.0000 mg | ORAL_TABLET | Freq: Every day | ORAL | Status: DC

## 2014-01-24 MED ORDER — QUETIAPINE FUMARATE 25 MG PO TABS: 25.0000 mg | ORAL_TABLET | Freq: Every day | ORAL | Status: DC

## 2014-01-24 MED ORDER — METHOCARBAMOL 500 MG PO TABS: 500.0000 mg | ORAL_TABLET | Freq: Three times a day (TID) | ORAL | Status: DC

## 2014-02-06 ENCOUNTER — Encounter: Payer: Self-pay | Admitting: *Deleted

## 2014-02-06 ENCOUNTER — Other Ambulatory Visit: Payer: Self-pay | Admitting: Internal Medicine

## 2014-02-06 DIAGNOSIS — S069X9A Unspecified intracranial injury with loss of consciousness of unspecified duration, initial encounter: Secondary | ICD-10-CM

## 2014-02-06 DIAGNOSIS — S069XAA Unspecified intracranial injury with loss of consciousness status unknown, initial encounter: Secondary | ICD-10-CM

## 2014-02-16 ENCOUNTER — Telehealth: Payer: Self-pay | Admitting: Internal Medicine

## 2014-02-16 NOTE — Telephone Encounter (Signed)
Tresa EndoKelly calling regarding orders that were faxed and received on 02/09/14 that needed to be filled by PCP to show that pt needs incontinent supplies.  Please f/u with Tresa EndoKelly.

## 2014-02-26 ENCOUNTER — Other Ambulatory Visit: Payer: Self-pay

## 2014-02-27 NOTE — Telephone Encounter (Signed)
These documents were faxed yesterday.

## 2014-04-09 ENCOUNTER — Telehealth: Payer: Self-pay | Admitting: Emergency Medicine

## 2014-04-09 ENCOUNTER — Telehealth: Payer: Self-pay | Admitting: Internal Medicine

## 2014-04-09 ENCOUNTER — Encounter: Payer: Self-pay | Admitting: Emergency Medicine

## 2014-04-09 NOTE — Telephone Encounter (Signed)
Pt has called in today requesting an appointment with the PCP; pt's mother states that the pt has been experiencing loss of appetite over the past week; please f/u with pt about pt concerns [phone]
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
## 2014-04-09 NOTE — Telephone Encounter (Signed)
Left voice message with pt mother Helmut Musterlicia in regards to Call A nurse report over the weekend. Per Dr. Hyman HopesJegede, he can see pt this week.

## 2014-04-12 ENCOUNTER — Ambulatory Visit: Payer: Medicaid Other | Attending: Internal Medicine | Admitting: Internal Medicine

## 2014-04-12 ENCOUNTER — Encounter: Payer: Self-pay | Admitting: Internal Medicine

## 2014-04-12 VITALS — BP 121/70 | HR 92 | Temp 98.1°F | Resp 16

## 2014-04-12 DIAGNOSIS — Y999 Unspecified external cause status: Secondary | ICD-10-CM | POA: Insufficient documentation

## 2014-04-12 DIAGNOSIS — S069X0S Unspecified intracranial injury without loss of consciousness, sequela: Secondary | ICD-10-CM

## 2014-04-12 DIAGNOSIS — S069X9S Unspecified intracranial injury with loss of consciousness of unspecified duration, sequela: Secondary | ICD-10-CM | POA: Insufficient documentation

## 2014-04-12 DIAGNOSIS — F3289 Other specified depressive episodes: Secondary | ICD-10-CM | POA: Insufficient documentation

## 2014-04-12 DIAGNOSIS — S0990XA Unspecified injury of head, initial encounter: Secondary | ICD-10-CM

## 2014-04-12 DIAGNOSIS — F919 Conduct disorder, unspecified: Secondary | ICD-10-CM | POA: Insufficient documentation

## 2014-04-12 DIAGNOSIS — Y929 Unspecified place or not applicable: Secondary | ICD-10-CM | POA: Insufficient documentation

## 2014-04-12 DIAGNOSIS — F329 Major depressive disorder, single episode, unspecified: Secondary | ICD-10-CM | POA: Diagnosis not present

## 2014-04-12 DIAGNOSIS — G4700 Insomnia, unspecified: Secondary | ICD-10-CM | POA: Insufficient documentation

## 2014-04-12 DIAGNOSIS — F17200 Nicotine dependence, unspecified, uncomplicated: Secondary | ICD-10-CM | POA: Insufficient documentation

## 2014-04-12 DIAGNOSIS — Z87820 Personal history of traumatic brain injury: Secondary | ICD-10-CM | POA: Insufficient documentation

## 2014-04-12 DIAGNOSIS — S069XAS Unspecified intracranial injury with loss of consciousness status unknown, sequela: Secondary | ICD-10-CM | POA: Insufficient documentation

## 2014-04-12 DIAGNOSIS — G894 Chronic pain syndrome: Secondary | ICD-10-CM | POA: Diagnosis not present

## 2014-04-12 DIAGNOSIS — Z8781 Personal history of (healed) traumatic fracture: Secondary | ICD-10-CM | POA: Insufficient documentation

## 2014-04-12 MED ORDER — METHOCARBAMOL 500 MG PO TABS: 500.0000 mg | ORAL_TABLET | Freq: Three times a day (TID) | ORAL | Status: DC

## 2014-04-12 MED ORDER — MULTIVITAMINS PO CAPS: 1.0000 | ORAL_CAPSULE | Freq: Every day | ORAL | Status: DC

## 2014-04-12 NOTE — Progress Notes (Signed)
Pt is here today because he is not sleeping or eating.

## 2014-04-21 ENCOUNTER — Encounter (HOSPITAL_COMMUNITY): Payer: Self-pay | Admitting: Emergency Medicine

## 2014-04-21 ENCOUNTER — Emergency Department (HOSPITAL_COMMUNITY)
Admission: EM | Admit: 2014-04-21 | Discharge: 2014-04-22 | Disposition: A | Discharge status: Home / Self Care | Payer: Medicaid Other | Attending: Emergency Medicine | Admitting: Emergency Medicine

## 2014-04-21 DIAGNOSIS — W503XXA Accidental bite by another person, initial encounter: Secondary | ICD-10-CM | POA: Insufficient documentation

## 2014-04-21 DIAGNOSIS — Y939 Activity, unspecified: Secondary | ICD-10-CM | POA: Insufficient documentation

## 2014-04-21 DIAGNOSIS — S01511A Laceration without foreign body of lip, initial encounter: Secondary | ICD-10-CM

## 2014-04-21 DIAGNOSIS — F329 Major depressive disorder, single episode, unspecified: Secondary | ICD-10-CM | POA: Insufficient documentation

## 2014-04-21 DIAGNOSIS — Z79899 Other long term (current) drug therapy: Secondary | ICD-10-CM | POA: Insufficient documentation

## 2014-04-21 DIAGNOSIS — Y929 Unspecified place or not applicable: Secondary | ICD-10-CM | POA: Insufficient documentation

## 2014-04-21 DIAGNOSIS — F3289 Other specified depressive episodes: Secondary | ICD-10-CM | POA: Insufficient documentation

## 2014-04-21 DIAGNOSIS — R569 Unspecified convulsions: Secondary | ICD-10-CM

## 2014-04-21 DIAGNOSIS — R4182 Altered mental status, unspecified: Secondary | ICD-10-CM | POA: Insufficient documentation

## 2014-04-21 DIAGNOSIS — S01501A Unspecified open wound of lip, initial encounter: Secondary | ICD-10-CM | POA: Insufficient documentation

## 2014-04-21 DIAGNOSIS — Z8672 Personal history of thrombophlebitis: Secondary | ICD-10-CM | POA: Insufficient documentation

## 2014-04-21 DIAGNOSIS — F17200 Nicotine dependence, unspecified, uncomplicated: Secondary | ICD-10-CM | POA: Insufficient documentation

## 2014-04-21 MED ORDER — LEVETIRACETAM 500 MG PO TABS
500.0000 mg | ORAL_TABLET | Freq: Once | ORAL | Status: AC
  Administered 2014-04-22: 500 mg via ORAL
  Filled 2014-04-21 (×2): qty 1

## 2014-04-21 NOTE — ED Provider Notes (Signed)
CSN: [id number]     Arrival date & time 04/21/14  2202 History   First MD Initiated Contact with Patient 04/21/14 2318     Chief Complaint  Patient presents with  . Seizures     (Consider location/radiation/quality/duration/timing/severity/associated sxs/prior Treatment) Patient is a 33 y.o. male presenting with seizures. The history is provided by a parent. The history is limited by the condition of the patient (status post traumatic brain injury).  Seizures He was observed to have a generalized seizure at home. Mother heard a gurgling noise and went in and saw him shaking. She tried to put a spoon in his mouth 200 and injury and she actually had her thumb didn't. He did bite his lip. He wears a diaper so does not known if he had any incontinence. Mother estimates seizure lasted 5 minutes. There is a brief postictal state he is now back to his baseline mental status. He had a traumatic brain injury 8 months ago.  Past Medical History  Diagnosis Date  . Depression     Pt was planning to start taking abilify 10/20  . Traumatic brain injury    Past Surgical History  Procedure Laterality Date  . Fracture surgery  08/13/2013    L arm and L leg  . Orif humerus fracture Left 08/13/2013    Procedure: OPEN REDUCTION INTERNAL FIXATION (ORIF) HUMERAL SHAFT FRACTURE;  Surgeon: Harvie JuniorJohn L Graves, MD;  Location: [facility] OR;  Service: Orthopedics;  Laterality: Left;  . Orif tibia plateau Left 08/13/2013    Procedure: OPEN REDUCTION INTERNAL FIXATION (ORIF) TIBIAL PLATEAU;  Surgeon: Harvie JuniorJohn L Graves, MD;  Location: [facility] OR;  Service: Orthopedics;  Laterality: Left;  . Percutaneous tracheostomy  08/17/2013    Dr. Megan MansJ Wyatt  . Peg w/tracheostomy placement  08/17/2013    Dr. Megan MansJ Wyatt  . Percutaneous tracheostomy N/A 08/17/2013    Procedure: PERCUTANEOUS TRACHEOSTOMY;  Surgeon: Cherylynn RidgesJames O Wyatt, MD;  [facility];  Service: General;  Laterality: N/A;   No family history on file. History  Substance Use Topics  .  Smoking status: Current Every Day Smoker  . Smokeless tobacco: Not on file  . Alcohol Use: No    Review of Systems  Unable to perform ROS: Mental status change  Neurological: Positive for seizures.      Allergies  Review of patient's allergies indicates no known allergies.  Home Medications   Prior to Admission medications   Medication Sig Start Date End Date Taking? Authorizing Sayvion Vigen  docusate sodium (COLACE) 100 MG capsule Take 1 capsule (100 mg total) by mouth every 12 (twelve) hours. 11/25/13  Yes Enid SkeensJoshua M Zavitz, MD  QUEtiapine (SEROQUEL) 25 MG tablet Take 1 tablet (25 mg total) by mouth at bedtime. 01/24/14  Yes Jeanann Lewandowskylugbemiga Jegede, MD  traZODone (DESYREL) 100 MG tablet Take 100 mg by mouth at bedtime.   Yes Historical Clent Damore, MD  methocarbamol (ROBAXIN) 500 MG tablet Take 1 tablet (500 mg total) by mouth 3 (three) times daily. 04/12/14   Jeanann Lewandowskylugbemiga Jegede, MD  Multiple Vitamin (MULTIVITAMIN) capsule Take 1 capsule by mouth daily. 04/12/14   Jeanann Lewandowskylugbemiga Jegede, MD   BP 102/68  Pulse 69  Resp 14  SpO2 98% Physical Exam  Nursing note and vitals reviewed.  33 year old male, resting comfortably and in no acute distress. Vital signs are normal. Oxygen saturation is 98%, which is normal. Head is normocephalic PERRLA, EOMI. Oropharynx is clear. There is a laceration of the lower lip inferior to the vermian border  oriented transversely. This does not extend into the muscularis. There is no intraoral laceration. Neck is nontender and supple without adenopathy or JVD. Back is nontender and there is no CVA tenderness. Lungs are clear without rales, wheezes, or rhonchi. Chest is nontender. Heart has regular rate and rhythm without murmur. Abdomen is soft, flat, nontender without masses or hepatosplenomegaly and peristalsis is normoactive. Extremities have no cyanosis or edema, full range of motion is present. Skin is warm and dry without rash. Neurologic: He is awake but speech is  slightly slurred. He is oriented to person but not place or time. Per mother, this is his current baseline. Cranial nerves are intact, there are no motor or sensory deficits.  ED Course  Procedures (including critical care time) LACERATION REPAIR Performed by: ZOXWR,UEAVW Authorized by: UJWJX,BJYNW Consent: Verbal consent obtained. Risks and benefits: risks, benefits and alternatives were discussed Consent given by: patient Patient identity confirmed: provided demographic data Prepped and Draped in normal sterile fashion Wound explored  Laceration Location: lower lip  Laceration Length: 4.0 cm  No Foreign Bodies seen or palpated  Anesthesia: none  Amount of cleaning: standard  Skin closure: close  Technique: Dermabond  Patient tolerance: Patient tolerated the procedure well with no immediate complications.  Labs Review Results for orders placed during the hospital encounter of 04/21/14  CBC WITH DIFFERENTIAL      Result Value Ref Range   WBC 12.2 (*) 4.0 - 10.5 K/uL   RBC 4.92  4.22 - 5.81 MIL/uL   Hemoglobin 15.0  13.0 - 17.0 g/dL   HCT 29.5  62.1 - 30.8 %   MCV 87.8  78.0 - 100.0 fL   MCH 30.5  26.0 - 34.0 pg   MCHC 34.7  30.0 - 36.0 g/dL   RDW 65.7  84.6 - 96.2 %   Platelets 139 (*) 150 - 400 K/uL   Neutrophils Relative % 82 (*) 43 - 77 %   Neutro Abs 10.0 (*) 1.7 - 7.7 K/uL   Lymphocytes Relative 10 (*) 12 - 46 %   Lymphs Abs 1.2  0.7 - 4.0 K/uL   Monocytes Relative 7  3 - 12 %   Monocytes Absolute 0.8  0.1 - 1.0 K/uL   Eosinophils Relative 1  0 - 5 %   Eosinophils Absolute 0.1  0.0 - 0.7 K/uL   Basophils Relative 0  0 - 1 %   Basophils Absolute 0.0  0.0 - 0.1 K/uL  COMPREHENSIVE METABOLIC PANEL      Result Value Ref Range   Sodium 140  137 - 147 mEq/L   Potassium 4.1  3.7 - 5.3 mEq/L   Chloride 99  96 - 112 mEq/L   CO2 26  19 - 32 mEq/L   Glucose, Bld 110 (*) 70 - 99 mg/dL   BUN 6  6 - 23 mg/dL   Creatinine, Ser 9.52  0.50 - 1.35 mg/dL   Calcium 8.6   8.4 - 84.1 mg/dL   Total Protein 8.0  6.0 - 8.3 g/dL   Albumin 3.9  3.5 - 5.2 g/dL   AST 15  0 - 37 U/L   ALT 9  0 - 53 U/L   Alkaline Phosphatase 95  39 - 117 U/L   Total Bilirubin 0.3  0.3 - 1.2 mg/dL   GFR calc non Af Amer >90  >90 mL/min   GFR calc Af Amer >90  >90 mL/min   Imaging Review Ct Head Wo Contrast  04/22/2014  CLINICAL DATA:  Seizure.  EXAM: CT HEAD WITHOUT CONTRAST  TECHNIQUE: Contiguous axial images were obtained from the base of the skull through the vertex without intravenous contrast.  COMPARISON:  09/01/2013  FINDINGS: Skull and Sinuses:No acute fracture. Remote blowout fracture of the medial left orbit. There is patchy opacification of right mastoid air cells. There has been some trabecular loss within the mastoid tip, but there is absent complete opacification at this level arguing against coalescent mastoiditis currently. There is patchy sclerosis of right mastoid air cells. Previously seen left mastoid hemorrhage has cleared. Mild nasopharyngeal tissue thickening, usually reactive. Mild inflammatory mucosal thickening in the left maxillary antrum.  Orbits: No acute abnormality.  Brain: There is cortical and subcortical encephalomalacia and gliosis in the high right frontal lobe and in the superficial left temporal lobe, sites of the brain contusion. Deep white matter low density in the right periatrial region was a site of parenchymal hemorrhage noted on admission CT 08/11/2013. There is generalized brain atrophy with ex vacuo ventricular enlargement, likely posttraumatic. Punctate high-density areas in the white matter around the left lateral ventricle likely represents mineralization. No evidence of acute infarct, acute hemorrhage, hydrocephalus, or shift. Dense globus pallidus mineralization, unexpected for age. This is usually seen with calcium metabolism dysregulation, but can be inherited/syndromic.  IMPRESSION: 1. No acute intracranial findings. 2. Remote traumatic brain  injury with multi focal cortical and white matter gliosis. Cortical scarring could be a source of seizure. 3. Globus pallidus calcification, abnormal for age. This is usually seen with calcium metabolism dysregulation. 4. Right mastoiditis with chronic features.   Electronically Signed   By: Tiburcio PeaJonathan  Watts M.D.   On: 04/22/2014 01:18    MDM   Final diagnoses:  Seizure  Laceration of lower lip, initial encounter    Seizure in patient with history of traumatic brain injury. I reviewed his past records and he had several intra-cerebral hemorrhages related to the traumatic brain injury. Seizures most likely secondary to scarring from this so he will need to be started on anticonvulsant medication. CT scan and metabolic panel will be checked. He had received TDaP booster in January of 2015.  Workup is significant for sequelae of traumatic brain injury which is the likely source of his seizure. He is started on levetiracetam and is discharged with prescription for same. Laceration is closed with Dermabond. He is referred back to his PCP for to obtain outpatient EEG.  Dione Boozeavid Glick, MD 04/22/14 [phone]

## 2014-04-21 NOTE — ED Notes (Signed)
Patient here from home.  Patient does not have a seizure disorder, but does have a history of TBI last year.  Patient had seizure and looks like he bit the right side of lower lip. Patient has been out of Trazodone and Seroquel since last Friday to last Wednesday.  Started taking them again.  Patient was postictal upon EMS arrival.  Patient is slightly confused per mom.  Mom at bedside.

## 2014-04-21 NOTE — ED Notes (Signed)
derma bond at bedside

## 2014-04-22 ENCOUNTER — Emergency Department (HOSPITAL_COMMUNITY): Payer: Medicaid Other

## 2014-04-22 LAB — COMPREHENSIVE METABOLIC PANEL
ALT: 9 U/L (ref 0–53)
AST: 15 U/L (ref 0–37)
Albumin: 3.9 g/dL (ref 3.5–5.2)
Alkaline Phosphatase: 95 U/L (ref 39–117)
BILIRUBIN TOTAL: 0.3 mg/dL (ref 0.3–1.2)
BUN: 6 mg/dL (ref 6–23)
CHLORIDE: 99 meq/L (ref 96–112)
CO2: 26 mEq/L (ref 19–32)
Calcium: 8.6 mg/dL (ref 8.4–10.5)
Creatinine, Ser: 1.03 mg/dL (ref 0.50–1.35)
GFR calc Af Amer: >90 mL/min (ref >90)
GLUCOSE: 110 mg/dL — AB (ref 70–99)
POTASSIUM: 4.1 meq/L (ref 3.7–5.3)
Sodium: 140 mEq/L (ref 137–147)
Total Protein: 8 g/dL (ref 6.0–8.3)

## 2014-04-22 LAB — CBC WITH DIFFERENTIAL/PLATELET
Basophils Absolute: 0 10*3/uL (ref 0.0–0.1)
Basophils Relative: 0 % (ref 0–1)
EOS ABS: 0.1 10*3/uL (ref 0.0–0.7)
Eosinophils Relative: 1 % (ref 0–5)
HCT: 43.2 % (ref 39.0–52.0)
Hemoglobin: 15 g/dL (ref 13.0–17.0)
LYMPHS ABS: 1.2 10*3/uL (ref 0.7–4.0)
Lymphocytes Relative: 10 % — ABNORMAL LOW (ref 12–46)
MCH: 30.5 pg (ref 26.0–34.0)
MCHC: 34.7 g/dL (ref 30.0–36.0)
MCV: 87.8 fL (ref 78.0–100.0)
MONOS PCT: 7 % (ref 3–12)
Monocytes Absolute: 0.8 10*3/uL (ref 0.1–1.0)
NEUTROS ABS: 10 10*3/uL — AB (ref 1.7–7.7)
NEUTROS PCT: 82 % — AB (ref 43–77)
Platelets: 139 10*3/uL — ABNORMAL LOW (ref 150–400)
RBC: 4.92 MIL/uL (ref 4.22–5.81)
RDW: 13.5 % (ref 11.5–15.5)
WBC: 12.2 10*3/uL — ABNORMAL HIGH (ref 4.0–10.5)

## 2014-04-22 MED ORDER — LEVETIRACETAM 500 MG PO TABS: 500.0000 mg | ORAL_TABLET | Freq: Two times a day (BID) | ORAL | Status: DC

## 2014-04-22 NOTE — ED Notes (Signed)
To CT

## 2014-04-22 NOTE — ED Notes (Signed)
Patient returned from CT

## 2014-04-22 NOTE — Discharge Instructions (Signed)
Follow up with your doctor to obtain an EEG (brain wave test) as an outpatient.  Seizure, Adult A seizure is abnormal electrical activity in the brain. Seizures usually last from 30 seconds to 2 minutes. There are various types of seizures. Before a seizure, you may have a warning sensation (aura) that a seizure is about to occur. An aura may include the following symptoms:   Fear or anxiety.  Nausea.  Feeling like the room is spinning (vertigo).  Vision changes, such as seeing flashing lights or spots. Common symptoms during a seizure include:  A change in attention or behavior (altered mental status).  Convulsions with rhythmic jerking movements.  Drooling.  Rapid eye movements.  Grunting.  Loss of bladder and bowel control.  Bitter taste in the mouth.  Tongue biting. After a seizure, you may feel confused and sleepy. You may also have an injury resulting from convulsions during the seizure. HOME CARE INSTRUCTIONS   If you are given medicines, take them exactly as prescribed by your health care provider.  Keep all follow-up appointments as directed by your health care provider.  Do not swim or drive or engage in risky activity during which a seizure could cause further injury to you or others until your health care provider says it is OK.  Get adequate rest.  Teach friends and family what to do if you have a seizure. They should:  Lay you on the ground to prevent a fall.  Put a cushion under your head.  Loosen any tight clothing around your neck.  Turn you on your side. If vomiting occurs, this helps keep your airway clear.  Stay with you until you recover.  Know whether or not you need emergency care. SEEK IMMEDIATE MEDICAL CARE IF:  The seizure lasts longer than 5 minutes.  The seizure is severe or you do not wake up immediately after the seizure.  You have an altered mental status after the seizure.  You are having more frequent or worsening  seizures. Someone should drive you to the emergency department or call local emergency services (911 in [place] MAKE SURE YOU:  Understand these instructions.  Will watch your condition.  Will get help right away if you are not doing well or get worse. Document Released: 10/09/2000 Document Revised: 08/02/2013 Document Reviewed: 05/24/2013 [facility] Patient Information 2015 [place], [place]. This information is not intended to replace advice given to you by your health care provider. Make sure you discuss any questions you have with your health care provider.  Tissue Adhesive Wound Care Some cuts, wounds, lacerations, and incisions can be repaired by using tissue adhesive. Tissue adhesive is like glue. It holds the skin together, allowing for faster healing. It forms a strong bond on the skin in about 1 minute and reaches its full strength in about 2 or 3 minutes. The adhesive disappears naturally while the wound is healing. It is important to take proper care of your wound at home while it heals.  HOME CARE INSTRUCTIONS   Showers are allowed. Do not soak the area containing the tissue adhesive. Do not take baths, swim, or use hot tubs. Do not use any soaps or ointments on the wound. Certain ointments can weaken the glue.  If a bandage (dressing) has been applied, follow your health care provider's instructions for how often to change the dressing.   Keep the dressing dry if one has been applied.   Do not scratch, pick, or rub the adhesive.   Do not  place tape over the adhesive. The adhesive could come off when pulling the tape off.   Protect the wound from further injury until it is healed.   Protect the wound from sun and tanning bed exposure while it is healing and for several weeks after healing.   Only take over-the-counter or prescription medicines as directed by your health care provider.   Keep all follow-up appointments as directed by your health care provider. SEEK  IMMEDIATE MEDICAL CARE IF:   Your wound becomes red, swollen, hot, or tender.   You develop a rash after the glue is applied.  You have increasing pain in the wound.   You have a red streak that goes away from the wound.   You have pus coming from the wound.   You have increased bleeding.  You have a fever.  You have shaking chills.   You notice a bad smell coming from the wound.   Your wound or adhesive breaks open.  MAKE SURE YOU:   Understand these instructions.  Will watch your condition.  Will get help right away if you are not doing well or get worse. Document Released: 04/07/2001 Document Revised: 08/02/2013 Document Reviewed: 05/03/2013 [facility] Patient Information 2015 [place]. This information is not intended to replace advice given to you by your health care provider. Make sure you discuss any questions you have with your health care provider.  Levetiracetam tablets What is this medicine? LEVETIRACETAM (lee ve tye RA se tam) is an antiepileptic drug. It is used with other medicines to treat certain types of seizures. This medicine may be used for other purposes; ask your health care provider or pharmacist if you have questions. COMMON BRAND NAME(S): Keppra What should I tell my health care provider before I take this medicine? They need to know if you have any of these conditions: -kidney disease -suicidal thoughts, plans, or attempt; a previous suicide attempt by you or a family member -an unusual or allergic reaction to levetiracetam, other medicines, foods, dyes, or preservatives -pregnant or trying to get pregnant -breast-feeding How should I use this medicine? Take this medicine by mouth with a glass of water. Follow the directions on the prescription label. Swallow the tablets whole. Do not crush or chew this medicine. You may take this medicine with or without food. Take your doses at regular intervals. Do not take your medicine more often  than directed. Do not stop taking this medicine or any of your seizure medicines unless instructed by your doctor or health care professional. Stopping your medicine suddenly can increase your seizures or their severity. A special MedGuide will be given to you by the pharmacist with each prescription and refill. Be sure to read this information carefully each time. Contact your pediatrician or health care professional regarding the use of this medication in children. While this drug may be prescribed for children as young as 44 years of age for selected conditions, precautions do apply. Overdosage: If you think you have taken too much of this medicine contact a poison control center or emergency room at once. NOTE: This medicine is only for you. Do not share this medicine with others. What if I miss a dose? If you miss a dose, take it as soon as you can. If it is almost time for your next dose, take only that dose. Do not take double or extra doses. What may interact with this medicine? This medicine may interact with the following medications: -carbamazepine -colesevelam -probenecid -sevelamer This  list may not describe all possible interactions. Give your health care provider a list of all the medicines, herbs, non-prescription drugs, or dietary supplements you use. Also tell them if you smoke, drink alcohol, or use illegal drugs. Some items may interact with your medicine. What should I watch for while using this medicine? Visit your doctor or health care professional for a regular check on your progress. Wear a medical identification bracelet or chain to say you have epilepsy, and carry a card that lists all your medications. It is important to take this medicine exactly as instructed by your health care professional. When first starting treatment, your dose may need to be adjusted. It may take weeks or months before your dose is stable. You should contact your doctor or health care professional  if your seizures get worse or if you have any new types of seizures. You may get drowsy or dizzy. Do not drive, use machinery, or do anything that needs mental alertness until you know how this medicine affects you. Do not stand or sit up quickly, especially if you are an older patient. This reduces the risk of dizzy or fainting spells. Alcohol may interfere with the effect of this medicine. Avoid alcoholic drinks. The use of this medicine may increase the chance of suicidal thoughts or actions. Pay special attention to how you are responding while on this medicine. Any worsening of mood, or thoughts of suicide or dying should be reported to your health care professional right away. Women who become pregnant while using this medicine may enroll in the [place] Antiepileptic Drug Pregnancy Registry by calling 505 [phone]. This registry collects information about the safety of antiepileptic drug use during pregnancy. What side effects may I notice from receiving this medicine? Side effects you should report to your doctor or health care professional as soon as possible: -allergic reactions like skin rash, itching or hives, swelling of the face, lips, or tongue -breathing problems -dark urine -general ill feeling or flu-like symptoms -problems with balance, talking, walking -unusually weak or tired -worsening of mood, thoughts or actions of suicide or dying -yellowing of the eyes or skin Side effects that usually do not require medical attention (report to your doctor or health care professional if they continue or are bothersome): -diarrhea -dizzy, drowsy -headache -loss of appetite This list may not describe all possible side effects. Call your doctor for medical advice about side effects. You may report side effects to FDA at 1-800-FDA-1088. Where should I keep my medicine? Keep out of reach of children. Store at room temperature between 15 and 30 degrees C (59 and 86 degrees F). Throw  away any unused medicine after the expiration date. NOTE: This sheet is a summary. It may not cover all possible information. If you have questions about this medicine, talk to your doctor, pharmacist, or health care provider.  2015, [employer]. (2013-09-05 08:42:48)

## 2014-04-24 ENCOUNTER — Ambulatory Visit: Payer: Self-pay | Admitting: Internal Medicine

## 2014-04-30 ENCOUNTER — Ambulatory Visit: Payer: Medicaid Other | Attending: Internal Medicine | Admitting: Internal Medicine

## 2014-04-30 ENCOUNTER — Encounter: Payer: Self-pay | Admitting: Internal Medicine

## 2014-04-30 VITALS — BP 107/75 | HR 81 | Temp 98.0°F | Resp 15

## 2014-04-30 DIAGNOSIS — T148XXA Other injury of unspecified body region, initial encounter: Principal | ICD-10-CM

## 2014-04-30 DIAGNOSIS — L089 Local infection of the skin and subcutaneous tissue, unspecified: Secondary | ICD-10-CM | POA: Insufficient documentation

## 2014-04-30 DIAGNOSIS — X58XXXA Exposure to other specified factors, initial encounter: Secondary | ICD-10-CM | POA: Diagnosis not present

## 2014-04-30 DIAGNOSIS — Y929 Unspecified place or not applicable: Secondary | ICD-10-CM | POA: Diagnosis not present

## 2014-04-30 DIAGNOSIS — F3289 Other specified depressive episodes: Secondary | ICD-10-CM | POA: Diagnosis not present

## 2014-04-30 DIAGNOSIS — F329 Major depressive disorder, single episode, unspecified: Secondary | ICD-10-CM | POA: Diagnosis not present

## 2014-04-30 DIAGNOSIS — Z79899 Other long term (current) drug therapy: Secondary | ICD-10-CM | POA: Insufficient documentation

## 2014-04-30 DIAGNOSIS — S0990XA Unspecified injury of head, initial encounter: Secondary | ICD-10-CM | POA: Insufficient documentation

## 2014-04-30 DIAGNOSIS — R561 Post traumatic seizures: Secondary | ICD-10-CM | POA: Diagnosis not present

## 2014-04-30 DIAGNOSIS — Z720 Tobacco use: Secondary | ICD-10-CM

## 2014-04-30 DIAGNOSIS — S069X9S Unspecified intracranial injury with loss of consciousness of unspecified duration, sequela: Secondary | ICD-10-CM

## 2014-04-30 DIAGNOSIS — S069X0S Unspecified intracranial injury without loss of consciousness, sequela: Secondary | ICD-10-CM

## 2014-04-30 DIAGNOSIS — F17200 Nicotine dependence, unspecified, uncomplicated: Secondary | ICD-10-CM | POA: Insufficient documentation

## 2014-04-30 DIAGNOSIS — S069XAS Unspecified intracranial injury with loss of consciousness status unknown, sequela: Secondary | ICD-10-CM

## 2014-04-30 DIAGNOSIS — T798XXA Other early complications of trauma, initial encounter: Secondary | ICD-10-CM

## 2014-04-30 MED ORDER — NICOTINE 21 MG/24HR TD PT24: 21.0000 mg | MEDICATED_PATCH | Freq: Every day | TRANSDERMAL | Status: DC

## 2014-04-30 MED ORDER — SULFAMETHOXAZOLE-TMP DS 800-160 MG PO TABS: 1.0000 | ORAL_TABLET | Freq: Two times a day (BID) | ORAL | Status: DC

## 2014-04-30 NOTE — Progress Notes (Signed)
Pt is here following up after having a seizure that put him in the ED. Pt states that he has a sore on his left leg. 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

## 2014-04-30 NOTE — Progress Notes (Signed)
Patient ID: Cory [name], male   DOB: 1981/08/24, 33 y.o.   MRN: [id number]   Cory [name], is a 33 y.o. male  NWG:[id number]  [id number]  DOB - 1981/08/24  Chief Complaint  Patient presents with  . Follow-up        Subjective:   Cory [name] is a 33 y.o. male here today for a follow up visit. Patient was brought in by a family member who reported that patient was in the ER recently had to have a seizure thought to be related to his traumatic brain injury. He was started on Keppra and to followup with primary care physician. During the seizure activity he had a wound on the lips that required suture. For the last 3 days patient has also been discharging pus from the wound on the left leg was told that he had surgery in the past, the family member all his mother were not sure if he has any hardware in the leg that may be causing these discharge from being infected. There is no fever. Patient is verbally offensive, will not cause her any questions but constantly saying "I hate you"...... since starting Keppra he has not had another seizure activity, he constantly wants to smoke cigarette or when he's not given one, he flares up in abusive words. Patient has No headache, No chest pain, No abdominal pain - No Nausea, No new weakness tingling or numbness, No Cough - SOB.  Problem  Wound Infection  Post-Traumatic Seizures    ALLERGIES: No Known Allergies  PAST MEDICAL HISTORY: Past Medical History  Diagnosis Date  . Depression     Pt was planning to start taking abilify 10/20  . Traumatic brain injury     MEDICATIONS AT HOME: Prior to Admission medications   Medication Sig Start Date End Date Taking? Authorizing Provider  levETIRAcetam (KEPPRA) 500 MG tablet Take 1 tablet (500 mg total) by mouth 2 (two) times daily. 04/22/14  Yes Dione Boozeavid Glick, MD  QUEtiapine (SEROQUEL) 25 MG tablet Take 1 tablet (25 mg total) by mouth at bedtime. 01/24/14  Yes Jeanann Lewandowskylugbemiga Renata Gambino, MD  traZODone  (DESYREL) 100 MG tablet Take 100 mg by mouth at bedtime.   Yes Historical Provider, MD  docusate sodium (COLACE) 100 MG capsule Take 1 capsule (100 mg total) by mouth every 12 (twelve) hours. 11/25/13   Enid SkeensJoshua M Zavitz, MD  methocarbamol (ROBAXIN) 500 MG tablet Take 1 tablet (500 mg total) by mouth 3 (three) times daily. 04/12/14   Jeanann Lewandowskylugbemiga Jullian Clayson, MD  Multiple Vitamin (MULTIVITAMIN) capsule Take 1 capsule by mouth daily. 04/12/14   Jeanann Lewandowskylugbemiga Monette Omara, MD  nicotine (NICODERM CQ - DOSED IN MG/24 HOURS) 21 mg/24hr patch Place 1 patch (21 mg total) onto the skin daily. 04/30/14   Jeanann Lewandowskylugbemiga Helmut Hennon, MD  sulfamethoxazole-trimethoprim (BACTRIM DS) 800-160 MG per tablet Take 1 tablet by mouth 2 (two) times daily. 04/30/14   Jeanann Lewandowskylugbemiga Beula Joyner, MD     Objective:   Filed Vitals:   04/30/14 0938  BP: 107/75  Pulse: 81  Temp: 98 F (36.7 C)  TempSrc: Oral  Resp: 15  SpO2: 95%    Exam General appearance : Awake, alert, not in any distress. Speech Clear. Not toxic looking HEENT: Atraumatic and Normocephalic, pupils equally reactive to light and accomodation Neck: supple, no JVD. No cervical lymphadenopathy.  Chest:Good air entry bilaterally, no added sounds  CVS: S1 S2 regular, no murmurs.  Abdomen: Bowel sounds present, Non tender and not distended with no gaurding, rigidity or rebound. Extremities:  Discharging wound on left leg, near ankle joint laterally, there is an old healed surgical scar Neurology: Awake alert, and oriented X 3, CN II-XII intact, Non focal Skin:No Rash Wounds:N/A  Data Review No results found for this basename: HGBA1C     Assessment & Plan   1. TBI (traumatic brain injury), without loss of consciousness, sequela, abnormal behavior Ambulatory referral to neurologist  2. Wound infection, initial encounter  - sulfamethoxazole-trimethoprim (BACTRIM DS) 800-160 MG per tablet; Take 1 tablet by mouth 2 (two) times daily.  Dispense: 28 tablet; Refill: 0  - Wound  culture  3. Post-traumatic seizures Continue Keppra - Ambulatory referral to Neurology  4. Tobacco abuse  - nicotine (NICODERM CQ - DOSED IN MG/24 HOURS) 21 mg/24hr patch; Place 1 patch (21 mg total) onto the skin daily.  Dispense: 28 patch; Refill: 3  Patient is very offensive and rude, using profane languages and vulgarity.    Return in about 3 months (around 07/31/2014), or if symptoms worsen or fail to improve, for Generalized Anxiety Disorder, TBI, Seizure.  The patient was given clear instructions to go to ER or return to medical center if symptoms don't improve, worsen or new problems develop. The patient verbalized understanding. The patient was told to call to get lab results if they haven't heard anything in the next week.   This note has been created with Education officer, environmentalDragon speech recognition software and smart phrase technology. Any transcriptional errors are unintentional.    Jeanann LewandowskyJEGEDE, Berta Denson, MD, MHA, FACP, FAAP [facility] [place], [place] [phone]   04/30/2014, 10:22 AM

## 2014-05-03 LAB — WOUND CULTURE
Gram Stain: NONE SEEN
Gram Stain: NONE SEEN

## 2014-05-11 ENCOUNTER — Encounter: Payer: Self-pay | Admitting: Neurology

## 2014-05-11 ENCOUNTER — Ambulatory Visit (INDEPENDENT_AMBULATORY_CARE_PROVIDER_SITE_OTHER): Payer: Medicaid Other | Admitting: Neurology

## 2014-05-11 VITALS — BP 110/74 | HR 78 | Wt 137.0 lb

## 2014-05-11 DIAGNOSIS — R569 Unspecified convulsions: Secondary | ICD-10-CM | POA: Insufficient documentation

## 2014-05-11 DIAGNOSIS — IMO0001 Reserved for inherently not codable concepts without codable children: Secondary | ICD-10-CM

## 2014-05-11 DIAGNOSIS — S0990XS Unspecified injury of head, sequela: Secondary | ICD-10-CM

## 2014-05-11 MED ORDER — DIVALPROEX SODIUM 250 MG PO DR TAB: DELAYED_RELEASE_TABLET | ORAL | Status: DC

## 2014-05-11 NOTE — Patient Instructions (Signed)
1. Start Depakote DR 250mg : Take 1 tab twice a day for 3 days, then increase to 2 tabs twice a day 2. Continue Keppra 500mg  twice a day for 1 week, then start reducing Keppra to 1 tab daily for 1 week, then 1/2 tab daily for 1 week, then stop. 3. Follow-up in 1 month
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
## 2014-05-21 ENCOUNTER — Ambulatory Visit: Payer: Self-pay | Admitting: Neurology

## 2014-05-31 ENCOUNTER — Ambulatory Visit: Payer: Medicaid Other | Attending: Internal Medicine | Admitting: Internal Medicine

## 2014-05-31 ENCOUNTER — Encounter: Payer: Self-pay | Admitting: Internal Medicine

## 2014-05-31 VITALS — BP 111/75 | HR 80 | Resp 16

## 2014-05-31 DIAGNOSIS — X58XXXA Exposure to other specified factors, initial encounter: Secondary | ICD-10-CM | POA: Insufficient documentation

## 2014-05-31 DIAGNOSIS — S069X9S Unspecified intracranial injury with loss of consciousness of unspecified duration, sequela: Secondary | ICD-10-CM

## 2014-05-31 DIAGNOSIS — S069X3S Unspecified intracranial injury with loss of consciousness of 1 hour to 5 hours 59 minutes, sequela: Secondary | ICD-10-CM

## 2014-05-31 DIAGNOSIS — L089 Local infection of the skin and subcutaneous tissue, unspecified: Secondary | ICD-10-CM

## 2014-05-31 DIAGNOSIS — F411 Generalized anxiety disorder: Secondary | ICD-10-CM | POA: Diagnosis not present

## 2014-05-31 DIAGNOSIS — S81809A Unspecified open wound, unspecified lower leg, initial encounter: Secondary | ICD-10-CM | POA: Diagnosis present

## 2014-05-31 DIAGNOSIS — S069XAS Unspecified intracranial injury with loss of consciousness status unknown, sequela: Secondary | ICD-10-CM

## 2014-05-31 DIAGNOSIS — T798XXA Other early complications of trauma, initial encounter: Secondary | ICD-10-CM

## 2014-05-31 DIAGNOSIS — S069X9A Unspecified intracranial injury with loss of consciousness of unspecified duration, initial encounter: Secondary | ICD-10-CM | POA: Insufficient documentation

## 2014-05-31 DIAGNOSIS — T148XXA Other injury of unspecified body region, initial encounter: Secondary | ICD-10-CM

## 2014-05-31 MED ORDER — CLINDAMYCIN HCL 300 MG PO CAPS: 300.0000 mg | ORAL_CAPSULE | Freq: Three times a day (TID) | ORAL | Status: DC

## 2014-05-31 MED ORDER — ESCITALOPRAM OXALATE 10 MG PO TABS: 10.0000 mg | ORAL_TABLET | Freq: Every day | ORAL | Status: DC

## 2014-05-31 NOTE — Progress Notes (Signed)
Pt is here following up on his open wound on his left leg.

## 2014-06-03 ENCOUNTER — Encounter: Payer: Self-pay | Admitting: Internal Medicine

## 2014-06-03 NOTE — Progress Notes (Signed)
Patient ID: Cory [name], male   DOB: 1980/10/30, 33 y.o.   MRN: [id number]   Cory [name], is a 33 y.o. male  [id number]  [id number]  DOB - 1980/10/30  Chief Complaint  Patient presents with  . Follow-up        Subjective:   Cory [name] is a 33 y.o. male here today for a follow up visit. Patient continues to be combative and agitated, he was brought in to clinic today by his caregiver. The caregiver noticed that patient needs extended time with the caregiver instead of the current 2 hours per day. Patient also continues to have discharge from his left leg. Recent wound culture grew MRSA sensitive to clindamycin. Patient has no fever. Continue to smoke heavily. Recently seen by neurologist for his behavioral disorder following traumatic brain injury, neurologist think this may be due to side effects of Keppra, medication was changed to Depakote for mood stabilization and seizure control. Patient has had no seizure since the last visit. Patient has No headache, No chest pain, No abdominal pain - No Nausea, No new weakness tingling or numbness, No Cough - SOB.  No problems updated.  ALLERGIES: No Known Allergies  PAST MEDICAL HISTORY: Past Medical History  Diagnosis Date  . Depression     Pt was planning to start taking abilify 10/20  . Traumatic brain injury   . Seizures     MEDICATIONS AT HOME: Prior to Admission medications   Medication Sig Start Date End Date Taking? Authorizing Provider  clindamycin (CLEOCIN) 300 MG capsule Take 1 capsule (300 mg total) by mouth 3 (three) times daily. 05/31/14   Jeanann Lewandowskylugbemiga Joscelyne Renville, MD  divalproex (DEPAKOTE) 250 MG DR tablet Take 1 tab twice a day for 3 days, then increase to 2 tabs twice a day 05/11/14   Van ClinesKaren M Aquino, MD  docusate sodium (COLACE) 100 MG capsule Take 1 capsule (100 mg total) by mouth every 12 (twelve) hours. 11/25/13   Enid SkeensJoshua M Zavitz, MD  escitalopram (LEXAPRO) 10 MG tablet Take 1 tablet (10 mg total) by mouth daily.  05/31/14   Jeanann Lewandowskylugbemiga Sade Hollon, MD  levETIRAcetam (KEPPRA) 500 MG tablet Take 1 tablet (500 mg total) by mouth 2 (two) times daily. 04/22/14   Dione Boozeavid Glick, MD  methocarbamol (ROBAXIN) 500 MG tablet Take 1 tablet (500 mg total) by mouth 3 (three) times daily. 04/12/14   Jeanann Lewandowskylugbemiga Roxanne Orner, MD  Multiple Vitamin (MULTIVITAMIN) capsule Take 1 capsule by mouth daily. 04/12/14   Jeanann Lewandowskylugbemiga Tyrann Donaho, MD  nicotine (NICODERM CQ - DOSED IN MG/24 HOURS) 21 mg/24hr patch Place 1 patch (21 mg total) onto the skin daily. 04/30/14   Jeanann Lewandowskylugbemiga Makyia Erxleben, MD  QUEtiapine (SEROQUEL) 25 MG tablet Take 1 tablet (25 mg total) by mouth at bedtime. 01/24/14   Jeanann Lewandowskylugbemiga Benson Porcaro, MD  sulfamethoxazole-trimethoprim (BACTRIM DS) 800-160 MG per tablet Take 1 tablet by mouth 2 (two) times daily. 04/30/14   Jeanann Lewandowskylugbemiga Terrelle Ruffolo, MD  traZODone (DESYREL) 100 MG tablet Take 100 mg by mouth at bedtime.    Historical Provider, MD     Objective:   Filed Vitals:   05/31/14 0913  BP: 111/75  Pulse: 80  Resp: 16  SpO2: 98%    Exam General appearance : Awake, alert, not in any distress. Talkative, agitated, combative  HEENT: Atraumatic and Normocephalic, pupils equally reactive to light and accomodation Neck: supple, no JVD. No cervical lymphadenopathy.  Chest:Good air entry bilaterally, no added sounds  CVS: S1 S2 regular, no murmurs.  Abdomen: Bowel sounds present, Non  tender and not distended with no gaurding, rigidity or rebound. Extremities: Discharge no warmth or B/L Lower Ext shows no edema, both legs are warm to touch Neurology: Awake alert, and oriented X 3, CN II-XII intact, Non focal   Data Review No results found for this basename: HGBA1C     Assessment & Plan   1. Wound infection  - clindamycin (CLEOCIN) 300 MG capsule; Take 1 capsule (300 mg total) by mouth 3 (three) times daily.  Dispense: 30 capsule; Refill: 0  2. GAD (generalized anxiety disorder)  - escitalopram (LEXAPRO) 10 MG tablet; Take 1 tablet (10 mg total) by  mouth daily.  Dispense: 30 tablet; Refill: 3 - Ambulatory referral to Psychiatry  3. Traumatic brain injury, with loss of consciousness of 1 hour to 5 hours 59 minutes, sequelae  Patient is followed up with neurologist Presently on antiepileptic drug for new onset seizure Patient has been referred to a psychiatrist for behavior disorder following traumatic brain injury  Form filled for extended time with caregiver.  Return in about 3 months (around 08/31/2014), or if symptoms worsen or fail to improve, for Follow up Pain and comorbidities.  The patient was given clear instructions to go to ER or return to medical center if symptoms don't improve, worsen or new problems develop. The patient verbalized understanding. The patient was told to call to get lab results if they haven't heard anything in the next week.   This note has been created with Education officer, environmental. Any transcriptional errors are unintentional.    Jeanann Lewandowsky, MD, MHA, FACP, FAAP [facility] [place], [place] [phone]

## 2014-06-04 ENCOUNTER — Telehealth: Payer: Self-pay | Admitting: *Deleted

## 2014-06-04 ENCOUNTER — Other Ambulatory Visit: Payer: Self-pay | Admitting: *Deleted

## 2014-06-04 MED ORDER — E-Z LOCK RAISED TOILET SEAT MISC: Status: DC

## 2014-06-04 NOTE — Telephone Encounter (Signed)
Patient's mother called with a request and a medication concern.  Patient needs order for RAISED TOILET SEAT Patient's mother also stated that the medication that the patient has "to calm him down" is not working. The patient "is still cussing and balling his fist toward me."

## 2014-06-04 NOTE — Progress Notes (Signed)
I spoke to the pt. She was requesting a raised toilet seat. So I wrote her for the DME

## 2014-06-05 ENCOUNTER — Telehealth: Payer: Self-pay | Admitting: *Deleted

## 2014-06-05 NOTE — Telephone Encounter (Signed)
Patient's mother stated that patient 'has the runs' and she believes that it is possibly due to his new medication, escitalopram.  Please review and follow with patient.

## 2014-06-07 ENCOUNTER — Telehealth: Payer: Self-pay | Admitting: *Deleted

## 2014-06-07 NOTE — Telephone Encounter (Signed)
LCSW contacted patient's mother in order to follow up on call and psychiatry referral. LCSW left a detailed message encouraging patient's mother to follow up with patient's insurance provider who can assess patient by phone and link him to an appropriate referral source.  LCSW left his contact information for follow up as needed.  Beverly Sessionsywan J Mily Malecki MSW, LCSW

## 2014-06-08 ENCOUNTER — Ambulatory Visit: Payer: Self-pay | Admitting: Neurology

## 2014-06-08 NOTE — Telephone Encounter (Signed)
I spoke to MS. Clearance CootsHarper and discussed that it was probably the antibiotics but he needed to finish the antibiotics completely

## 2014-06-11 ENCOUNTER — Telehealth: Payer: Self-pay | Admitting: Neurology

## 2014-06-11 ENCOUNTER — Telehealth: Payer: Self-pay | Admitting: *Deleted

## 2014-06-11 NOTE — Telephone Encounter (Signed)
Patient called to follow up on DME orders for RAISED TOILET SEAT Once orders are complete LCSW can fax them DME provider.

## 2014-06-11 NOTE — Telephone Encounter (Signed)
Pt mother [phone] states she is trying to get him to a [facility] and needs us to fax the records to [employer] Jarrell at [phone]

## 2014-06-12 NOTE — Telephone Encounter (Signed)
OV note faxed

## 2014-06-22 ENCOUNTER — Telehealth: Payer: Self-pay | Admitting: *Deleted

## 2014-06-22 ENCOUNTER — Emergency Department (HOSPITAL_COMMUNITY)
Admission: EM | Admit: 2014-06-22 | Discharge: 2014-06-23 | Disposition: A | Discharge status: Home / Self Care | Payer: Federal, State, Local not specified - Other | Attending: Emergency Medicine | Admitting: Emergency Medicine

## 2014-06-22 ENCOUNTER — Encounter (HOSPITAL_COMMUNITY): Payer: Self-pay | Admitting: Emergency Medicine

## 2014-06-22 DIAGNOSIS — Z87891 Personal history of nicotine dependence: Secondary | ICD-10-CM | POA: Insufficient documentation

## 2014-06-22 DIAGNOSIS — Z79899 Other long term (current) drug therapy: Secondary | ICD-10-CM | POA: Insufficient documentation

## 2014-06-22 DIAGNOSIS — Z87820 Personal history of traumatic brain injury: Secondary | ICD-10-CM | POA: Insufficient documentation

## 2014-06-22 DIAGNOSIS — Z792 Long term (current) use of antibiotics: Secondary | ICD-10-CM | POA: Diagnosis not present

## 2014-06-22 DIAGNOSIS — F411 Generalized anxiety disorder: Secondary | ICD-10-CM

## 2014-06-22 DIAGNOSIS — S069XAA Unspecified intracranial injury with loss of consciousness status unknown, initial encounter: Secondary | ICD-10-CM | POA: Diagnosis present

## 2014-06-22 DIAGNOSIS — G40909 Epilepsy, unspecified, not intractable, without status epilepticus: Secondary | ICD-10-CM | POA: Insufficient documentation

## 2014-06-22 DIAGNOSIS — F911 Conduct disorder, childhood-onset type: Secondary | ICD-10-CM | POA: Insufficient documentation

## 2014-06-22 DIAGNOSIS — R4689 Other symptoms and signs involving appearance and behavior: Secondary | ICD-10-CM | POA: Diagnosis present

## 2014-06-22 DIAGNOSIS — S069X9A Unspecified intracranial injury with loss of consciousness of unspecified duration, initial encounter: Secondary | ICD-10-CM | POA: Diagnosis present

## 2014-06-22 DIAGNOSIS — F319 Bipolar disorder, unspecified: Secondary | ICD-10-CM | POA: Diagnosis not present

## 2014-06-22 HISTORY — DX: Bipolar disorder, unspecified: F31.9

## 2014-06-22 LAB — ETHANOL: Alcohol, Ethyl (B): <11 mg/dL (ref 0–11)

## 2014-06-22 LAB — CBC WITH DIFFERENTIAL/PLATELET
Basophils Absolute: 0.1 10*3/uL (ref 0.0–0.1)
Basophils Relative: 1 % (ref 0–1)
Eosinophils Absolute: 0.1 10*3/uL (ref 0.0–0.7)
Eosinophils Relative: 1 % (ref 0–5)
HCT: 45.8 % (ref 39.0–52.0)
Hemoglobin: 15.9 g/dL (ref 13.0–17.0)
LYMPHS ABS: 1.7 10*3/uL (ref 0.7–4.0)
Lymphocytes Relative: 27 % (ref 12–46)
MCH: 29.9 pg (ref 26.0–34.0)
MCHC: 34.7 g/dL (ref 30.0–36.0)
MCV: 86.3 fL (ref 78.0–100.0)
MONO ABS: 0.6 10*3/uL (ref 0.1–1.0)
Monocytes Relative: 10 % (ref 3–12)
Neutro Abs: 3.7 10*3/uL (ref 1.7–7.7)
Neutrophils Relative %: 61 % (ref 43–77)
PLATELETS: 100 10*3/uL — AB (ref 150–400)
RBC: 5.31 MIL/uL (ref 4.22–5.81)
RDW: 14.5 % (ref 11.5–15.5)
WBC: 6.2 10*3/uL (ref 4.0–10.5)

## 2014-06-22 LAB — RAPID URINE DRUG SCREEN, HOSP PERFORMED
Amphetamines: NOT DETECTED
BARBITURATES: NOT DETECTED
Benzodiazepines: POSITIVE — AB
Cocaine: NOT DETECTED
Opiates: NOT DETECTED
Tetrahydrocannabinol: NOT DETECTED

## 2014-06-22 LAB — COMPREHENSIVE METABOLIC PANEL
ALT: 11 U/L (ref 0–53)
ANION GAP: 14 (ref 5–15)
AST: 19 U/L (ref 0–37)
Albumin: 3.8 g/dL (ref 3.5–5.2)
Alkaline Phosphatase: 84 U/L (ref 39–117)
BILIRUBIN TOTAL: 0.5 mg/dL (ref 0.3–1.2)
BUN: 9 mg/dL (ref 6–23)
CHLORIDE: 99 meq/L (ref 96–112)
CO2: 27 meq/L (ref 19–32)
CREATININE: 0.99 mg/dL (ref 0.50–1.35)
Calcium: 8.6 mg/dL (ref 8.4–10.5)
GFR calc Af Amer: >90 mL/min (ref >90)
Glucose, Bld: 97 mg/dL (ref 70–99)
Potassium: 4.1 mEq/L (ref 3.7–5.3)
Sodium: 140 mEq/L (ref 137–147)
Total Protein: 8.2 g/dL (ref 6.0–8.3)

## 2014-06-22 LAB — VALPROIC ACID LEVEL: VALPROIC ACID LVL: 72.9 ug/mL (ref 50.0–100.0)

## 2014-06-22 MED ORDER — LORAZEPAM 2 MG/ML IJ SOLN
INTRAMUSCULAR | Status: AC
  Filled 2014-06-22: qty 1

## 2014-06-22 MED ORDER — LORAZEPAM 1 MG PO TABS
1.0000 mg | ORAL_TABLET | Freq: Three times a day (TID) | ORAL | Status: DC | PRN
  Administered 2014-06-23: 1 mg via ORAL
  Filled 2014-06-22: qty 1

## 2014-06-22 MED ORDER — DIVALPROEX SODIUM 500 MG PO DR TAB
500.0000 mg | DELAYED_RELEASE_TABLET | Freq: Once | ORAL | Status: AC
  Administered 2014-06-22: 500 mg via ORAL
  Filled 2014-06-22 (×2): qty 1

## 2014-06-22 MED ORDER — MIDAZOLAM HCL 2 MG/2ML IJ SOLN
3.0000 mg | Freq: Once | INTRAMUSCULAR | Status: AC
  Administered 2014-06-22: 3 mg via INTRAMUSCULAR

## 2014-06-22 MED ORDER — NICOTINE 21 MG/24HR TD PT24
21.0000 mg | MEDICATED_PATCH | Freq: Every day | TRANSDERMAL | Status: DC
  Administered 2014-06-22 – 2014-06-23 (×2): 21 mg via TRANSDERMAL
  Filled 2014-06-22 (×2): qty 1

## 2014-06-22 MED ORDER — TRAZODONE HCL 100 MG PO TABS
100.0000 mg | ORAL_TABLET | Freq: Every day | ORAL | Status: DC
  Administered 2014-06-22: 100 mg via ORAL
  Filled 2014-06-22: qty 1

## 2014-06-22 MED ORDER — QUETIAPINE FUMARATE 50 MG PO TABS
50.0000 mg | ORAL_TABLET | Freq: Every day | ORAL | Status: DC
  Administered 2014-06-22: 50 mg via ORAL
  Filled 2014-06-22: qty 1

## 2014-06-22 MED ORDER — MIDAZOLAM HCL 2 MG/2ML IJ SOLN
INTRAMUSCULAR | Status: AC
  Filled 2014-06-22: qty 4

## 2014-06-22 MED ORDER — DIVALPROEX SODIUM 250 MG PO DR TAB: 250.0000 mg | DELAYED_RELEASE_TABLET | Freq: Two times a day (BID) | ORAL | Status: DC

## 2014-06-22 MED ORDER — LEVETIRACETAM 500 MG PO TABS
500.0000 mg | ORAL_TABLET | Freq: Two times a day (BID) | ORAL | Status: DC
  Administered 2014-06-22 – 2014-06-23 (×3): 500 mg via ORAL
  Filled 2014-06-22 (×5): qty 1

## 2014-06-22 MED ORDER — QUETIAPINE FUMARATE 25 MG PO TABS: 25.0000 mg | ORAL_TABLET | Freq: Every day | ORAL | Status: DC

## 2014-06-22 MED ORDER — METHOCARBAMOL 500 MG PO TABS
500.0000 mg | ORAL_TABLET | Freq: Three times a day (TID) | ORAL | Status: DC
  Administered 2014-06-22 – 2014-06-23 (×3): 500 mg via ORAL
  Filled 2014-06-22 (×3): qty 1

## 2014-06-22 MED ORDER — DIVALPROEX SODIUM 500 MG PO DR TAB
500.0000 mg | DELAYED_RELEASE_TABLET | Freq: Two times a day (BID) | ORAL | Status: DC
  Administered 2014-06-22 – 2014-06-23 (×2): 500 mg via ORAL
  Filled 2014-06-22 (×2): qty 1

## 2014-06-22 MED ORDER — CLINDAMYCIN HCL 300 MG PO CAPS: 300.0000 mg | ORAL_CAPSULE | Freq: Three times a day (TID) | ORAL | Status: DC

## 2014-06-22 MED ORDER — SULFAMETHOXAZOLE-TMP DS 800-160 MG PO TABS
1.0000 | ORAL_TABLET | Freq: Two times a day (BID) | ORAL | Status: DC
  Administered 2014-06-22: 1 via ORAL
  Filled 2014-06-22: qty 1

## 2014-06-22 MED ORDER — ESCITALOPRAM OXALATE 10 MG PO TABS
10.0000 mg | ORAL_TABLET | Freq: Every day | ORAL | Status: DC
  Administered 2014-06-22 – 2014-06-23 (×2): 10 mg via ORAL
  Filled 2014-06-22 (×2): qty 1

## 2014-06-22 MED ORDER — DOCUSATE SODIUM 100 MG PO CAPS
100.0000 mg | ORAL_CAPSULE | Freq: Two times a day (BID) | ORAL | Status: DC
  Administered 2014-06-22: 100 mg via ORAL
  Filled 2014-06-22: qty 1

## 2014-06-22 MED ORDER — QUETIAPINE FUMARATE 25 MG PO TABS
25.0000 mg | ORAL_TABLET | Freq: Three times a day (TID) | ORAL | Status: DC
  Administered 2014-06-23: 25 mg via ORAL
  Filled 2014-06-22 (×2): qty 1

## 2014-06-22 MED ORDER — LORAZEPAM 2 MG/ML IJ SOLN
1.0000 mg | Freq: Once | INTRAMUSCULAR | Status: AC
  Administered 2014-06-22: 1 mg via INTRAVENOUS

## 2014-06-22 NOTE — BH Assessment (Signed)
[facility] Assessment Progress Note Spoke with Delorse Limber, Rn at [facility], who said that they cannot take care of him there due to his medical issues of active seizures.  Pt will need to be medically cleared and evaluated here.
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
## 2014-06-22 NOTE — ED Notes (Signed)
Patient showing his penis to the nursing staff. He has been told this is not appropriate and he stopped.
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
## 2014-06-22 NOTE — ED Notes (Signed)
Removed pt from restraint pt is cooperative

## 2014-06-22 NOTE — Telephone Encounter (Signed)
Patient's mother/caregiver called back to state that [facility] was transporting patient to [facility] for further assessment with high likelihood of psychiatric admission.  LCSW shared with patient's mother/caregiver that his PCP will follow as needed post discharge from psychiatric hospitalization.  Beverly Sessions MSW, LCSW

## 2014-06-22 NOTE — ED Notes (Signed)
Patient is incontinent of urine at times. Patient wears bladder briefs.Patient does not meet [facility] ED requirements at this time. Charge Nurse Social worker notified.
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
## 2014-06-22 NOTE — ED Notes (Signed)
Pt was given Versed  IM in left vasteslateralis.

## 2014-06-22 NOTE — ED Notes (Signed)
Pt has been changed into med clearance scrubs. Pt and belongings wanded, and kept by nurse station 1-8
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
## 2014-06-22 NOTE — ED Notes (Signed)
Pt. show his penis to the writer twice he was informed that was disrespectful and to stop

## 2014-06-23 ENCOUNTER — Encounter (HOSPITAL_COMMUNITY): Payer: Self-pay | Admitting: Psychiatry

## 2014-06-23 DIAGNOSIS — R4689 Other symptoms and signs involving appearance and behavior: Secondary | ICD-10-CM | POA: Diagnosis present

## 2014-06-23 DIAGNOSIS — F603 Borderline personality disorder: Secondary | ICD-10-CM

## 2014-06-23 MED ORDER — LORAZEPAM 1 MG PO TABS: 1.0000 mg | ORAL_TABLET | Freq: Three times a day (TID) | ORAL | Status: AC | PRN

## 2014-06-23 MED ORDER — ESCITALOPRAM OXALATE 10 MG PO TABS: 10.0000 mg | ORAL_TABLET | Freq: Every day | ORAL | Status: DC

## 2014-06-23 MED ORDER — QUETIAPINE FUMARATE 100 MG PO TABS: 100.0000 mg | ORAL_TABLET | Freq: Every day | ORAL | Status: DC

## 2014-06-23 MED ORDER — LORAZEPAM 2 MG/ML IJ SOLN
1.0000 mg | Freq: Once | INTRAMUSCULAR | Status: AC
  Administered 2014-06-23: 1 mg via INTRAMUSCULAR
  Filled 2014-06-23: qty 1

## 2014-06-23 MED ORDER — CENTRUM PO CHEW
1.0000 | CHEWABLE_TABLET | Freq: Every day | ORAL | Status: DC
Start: 2014-06-23 — End: 2014-07-09

## 2014-06-23 MED ORDER — TRAZODONE HCL 100 MG PO TABS: 100.0000 mg | ORAL_TABLET | Freq: Every day | ORAL | Status: DC

## 2014-06-23 MED ORDER — QUETIAPINE FUMARATE 50 MG PO TABS: 50.0000 mg | ORAL_TABLET | Freq: Three times a day (TID) | ORAL | Status: DC

## 2014-06-23 MED ORDER — DIVALPROEX SODIUM 500 MG PO DR TAB: 500.0000 mg | DELAYED_RELEASE_TABLET | Freq: Two times a day (BID) | ORAL | Status: DC

## 2014-06-23 NOTE — ED Notes (Signed)
IV removed per pt's request, he sts it's painful. Pt is asking for his pants, explained to him his bellogings will be given to him upon discharge

## 2014-06-23 NOTE — Discharge Instructions (Signed)
Aggression °Physically aggressive behavior is common among small children. When frustrated or angry, toddlers may act out. Often, they will push, bite, or hit. Most children show less physical aggression as they grow up. Their language and interpersonal skills improve, too. But continued aggressive behavior is a sign of a problem. This behavior can lead to aggression and delinquency in adolescence and adulthood. °Aggressive behavior can be psychological or physical. Forms of psychological aggression include threatening or bullying others. Forms of physical aggression include:  °· Pushing. °· Hitting. °· Slapping. °· Kicking. °· Stabbing. °· Shooting. °· Raping.  °PREVENTION  °Encouraging the following behaviors can help manage aggression: °· Respecting others and valuing differences. °· Participating in school and community functions, including sports, music, after-school programs, community groups, and volunteer work. °· Talking with an adult when they are sad, depressed, fearful, anxious, or angry. Discussions with a parent or other family member, counselor, teacher, or coach can help. °· Avoiding alcohol and drug use. °· Dealing with disagreements without aggression, such as conflict resolution. To learn this, children need parents and caregivers to model respectful communication and problem solving. °· Limiting exposure to aggression and violence, such as video games that are not age appropriate, violence in the media, or domestic violence. °Document Released: 08/09/2007 Document Revised: 01/04/2012 Document Reviewed: 12/18/2010 °ExitCare® Patient Information ©2015 [employer]. This information is not intended to replace advice given to you by your health care provider. Make sure you discuss any questions you have with your health care provider. ° °

## 2014-06-23 NOTE — [facility] Suicide Risk Assessment (Signed)
Suicide Risk Assessment  Discharge Assessment     Demographic Factors:  Male  Total Time spent with patient: 20 minutes   Psychiatric Specialty Exam:     Blood pressure 109/65, pulse 88, temperature 98.7 F (37.1 C), temperature source Oral, resp. rate 16, SpO2 100.00%.There is no weight on file to calculate BMI.  General Appearance: Casual  Eye Contact::  Good  Speech:  Normal Rate  Volume:  Normal  Mood:  Euthymic  Affect:  Congruent  Thought Process:  Coherent  Orientation:  Full (Time, Place, and Person)  Thought Content:  WDL  Suicidal Thoughts:  No  Homicidal Thoughts:  No  Memory:  Immediate;   Fair Recent;   Fair Remote;   Fair  Judgement:  Fair  Insight:  Lacking  Psychomotor Activity:  Normal  Concentration:  Fair  Recall:  [employer] of Knowledge:Fair  Language: Fair  Akathisia:  No  Handed:  Right  AIMS (if indicated):     Assets:  Health and safety inspector Housing Leisure Time Resilience Social Support  Sleep:      Musculoskeletal: Strength & Muscle Tone: within normal limits Gait & Station: normal Patient leans: N/A  Mental Status Per Nursing Assessment::   On Admission:   Aggression  Current Mental Status by Physician: NA  Loss Factors: NA  Historical Factors: Impulsivity  Risk Reduction Factors:   Sense of responsibility to family, Living with another person, especially a relative, Positive social support and Positive therapeutic relationship  Continued Clinical Symptoms:  None  Cognitive Features That Contribute To Risk:  None  Suicide Risk:  Minimal: No identifiable suicidal ideation.  Patients presenting with no risk factors but with morbid ruminations; may be classified as minimal risk based on the severity of the depressive symptoms  Discharge Diagnoses:   AXIS I:  Aggression AXIS II:  Deferred AXIS III:   Past Medical History  Diagnosis Date  . Depression     Pt was planning to start taking abilify 10/20  .  Traumatic brain injury   . Seizures   . Bipolar 1 disorder    AXIS IV:  seizures, chronic physical issues AXIS V:  61-70 mild symptoms  Plan Of Care/Follow-up recommendations:  Activity:  as tolerated Diet:  low-sodium heart healthy diet  Is patient on multiple antipsychotic therapies at discharge:  No   Has Patient had three or more failed trials of antipsychotic monotherapy by history:  No  Recommended Plan for Multiple Antipsychotic Therapies: NA    LORD, JAMISON, PMH-NP 06/23/2014, 1:50 PM

## 2014-06-23 NOTE — ED Notes (Signed)
Pt's belongings are still at the 1-8 nurses station.

## 2014-06-23 NOTE — ED Notes (Signed)
Family at bedside. Mother of the patient would like a call back from the doctor, with the plan for her son. She can be reached at [phone]

## 2014-06-23 NOTE — ED Notes (Signed)
Spoke with pt's family to inform them about discharge plans, however they stated they don't have any money to fill prescriptions and would like help from Child psychotherapist. Paged SW.

## 2014-06-23 NOTE — ED Notes (Signed)
Pt's mother visiting with pt.
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
## 2014-06-26 ENCOUNTER — Ambulatory Visit (INDEPENDENT_AMBULATORY_CARE_PROVIDER_SITE_OTHER): Payer: Medicaid Other | Admitting: Neurology

## 2014-06-26 ENCOUNTER — Encounter: Payer: Self-pay | Admitting: Neurology

## 2014-06-26 VITALS — BP 124/66 | HR 90 | Resp 18 | Wt 138.0 lb

## 2014-06-26 DIAGNOSIS — R569 Unspecified convulsions: Secondary | ICD-10-CM

## 2014-06-26 DIAGNOSIS — S0990XS Unspecified injury of head, sequela: Secondary | ICD-10-CM

## 2014-06-26 DIAGNOSIS — S0990XA Unspecified injury of head, initial encounter: Secondary | ICD-10-CM | POA: Insufficient documentation

## 2014-06-26 DIAGNOSIS — R269 Unspecified abnormalities of gait and mobility: Secondary | ICD-10-CM

## 2014-06-26 DIAGNOSIS — R2681 Unsteadiness on feet: Secondary | ICD-10-CM

## 2014-06-26 DIAGNOSIS — IMO0001 Reserved for inherently not codable concepts without codable children: Secondary | ICD-10-CM
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
## 2014-06-27 ENCOUNTER — Other Ambulatory Visit: Payer: Self-pay | Admitting: Family Medicine

## 2014-06-27 DIAGNOSIS — R269 Unspecified abnormalities of gait and mobility: Secondary | ICD-10-CM

## 2014-06-27 DIAGNOSIS — IMO0001 Reserved for inherently not codable concepts without codable children: Secondary | ICD-10-CM

## 2014-07-05 ENCOUNTER — Emergency Department (HOSPITAL_COMMUNITY)
Admission: EM | Admit: 2014-07-05 | Discharge: 2014-07-09 | Disposition: A | Discharge status: Home / Self Care | Payer: Federal, State, Local not specified - Other | Attending: Emergency Medicine | Admitting: Emergency Medicine

## 2014-07-05 ENCOUNTER — Encounter (HOSPITAL_COMMUNITY): Payer: Self-pay | Admitting: Emergency Medicine

## 2014-07-05 DIAGNOSIS — Z87891 Personal history of nicotine dependence: Secondary | ICD-10-CM | POA: Diagnosis not present

## 2014-07-05 DIAGNOSIS — Z87820 Personal history of traumatic brain injury: Secondary | ICD-10-CM | POA: Insufficient documentation

## 2014-07-05 DIAGNOSIS — S069XAA Unspecified intracranial injury with loss of consciousness status unknown, initial encounter: Secondary | ICD-10-CM | POA: Diagnosis present

## 2014-07-05 DIAGNOSIS — R4689 Other symptoms and signs involving appearance and behavior: Secondary | ICD-10-CM | POA: Diagnosis present

## 2014-07-05 DIAGNOSIS — F319 Bipolar disorder, unspecified: Secondary | ICD-10-CM | POA: Insufficient documentation

## 2014-07-05 DIAGNOSIS — Z046 Encounter for general psychiatric examination, requested by authority: Secondary | ICD-10-CM | POA: Insufficient documentation

## 2014-07-05 DIAGNOSIS — Z91199 Patient's noncompliance with other medical treatment and regimen due to unspecified reason: Secondary | ICD-10-CM | POA: Insufficient documentation

## 2014-07-05 DIAGNOSIS — Z9119 Patient's noncompliance with other medical treatment and regimen: Secondary | ICD-10-CM | POA: Insufficient documentation

## 2014-07-05 DIAGNOSIS — S069X0S Unspecified intracranial injury without loss of consciousness, sequela: Secondary | ICD-10-CM

## 2014-07-05 DIAGNOSIS — Z79899 Other long term (current) drug therapy: Secondary | ICD-10-CM | POA: Insufficient documentation

## 2014-07-05 DIAGNOSIS — F411 Generalized anxiety disorder: Secondary | ICD-10-CM

## 2014-07-05 DIAGNOSIS — F919 Conduct disorder, unspecified: Secondary | ICD-10-CM | POA: Insufficient documentation

## 2014-07-05 DIAGNOSIS — S42412K Displaced simple supracondylar fracture without intercondylar fracture of left humerus, subsequent encounter for fracture with nonunion: Secondary | ICD-10-CM

## 2014-07-05 DIAGNOSIS — S069X9A Unspecified intracranial injury with loss of consciousness of unspecified duration, initial encounter: Secondary | ICD-10-CM | POA: Diagnosis present

## 2014-07-05 LAB — ETHANOL: Alcohol, Ethyl (B): <11 mg/dL (ref 0–11)

## 2014-07-05 LAB — CBC
HCT: 42.1 % (ref 39.0–52.0)
Hemoglobin: 14.7 g/dL (ref 13.0–17.0)
MCH: 29.7 pg (ref 26.0–34.0)
MCHC: 34.9 g/dL (ref 30.0–36.0)
MCV: 85.1 fL (ref 78.0–100.0)
Platelets: 117 10*3/uL — ABNORMAL LOW (ref 150–400)
RBC: 4.95 MIL/uL (ref 4.22–5.81)
RDW: 15 % (ref 11.5–15.5)
WBC: 6.6 10*3/uL (ref 4.0–10.5)

## 2014-07-05 LAB — SALICYLATE LEVEL: Salicylate Lvl: <2 mg/dL — ABNORMAL LOW (ref 2.8–20.0)

## 2014-07-05 LAB — COMPREHENSIVE METABOLIC PANEL
ALK PHOS: 76 U/L (ref 39–117)
ALT: 35 U/L (ref 0–53)
AST: 46 U/L — AB (ref 0–37)
Albumin: 3.6 g/dL (ref 3.5–5.2)
Anion gap: 14 (ref 5–15)
BUN: 14 mg/dL (ref 6–23)
CO2: 25 meq/L (ref 19–32)
Calcium: 8.6 mg/dL (ref 8.4–10.5)
Chloride: 101 mEq/L (ref 96–112)
Creatinine, Ser: 0.84 mg/dL (ref 0.50–1.35)
Glucose, Bld: 94 mg/dL (ref 70–99)
POTASSIUM: 4.2 meq/L (ref 3.7–5.3)
SODIUM: 140 meq/L (ref 137–147)
Total Bilirubin: 0.4 mg/dL (ref 0.3–1.2)
Total Protein: 8.1 g/dL (ref 6.0–8.3)

## 2014-07-05 LAB — ACETAMINOPHEN LEVEL

## 2014-07-05 MED ORDER — ONDANSETRON HCL 4 MG PO TABS: 4.0000 mg | ORAL_TABLET | Freq: Three times a day (TID) | ORAL | Status: DC | PRN

## 2014-07-05 MED ORDER — ZOLPIDEM TARTRATE 5 MG PO TABS: 5.0000 mg | ORAL_TABLET | Freq: Every evening | ORAL | Status: DC | PRN

## 2014-07-05 MED ORDER — ACETAMINOPHEN 325 MG PO TABS: 650.0000 mg | ORAL_TABLET | ORAL | Status: DC | PRN

## 2014-07-05 MED ORDER — NICOTINE 21 MG/24HR TD PT24
21.0000 mg | MEDICATED_PATCH | Freq: Every day | TRANSDERMAL | Status: DC
  Administered 2014-07-05 – 2014-07-07 (×3): 21 mg via TRANSDERMAL
  Filled 2014-07-05 (×4): qty 1

## 2014-07-05 MED ORDER — ZIPRASIDONE MESYLATE 20 MG IM SOLR: 20.0000 mg | Freq: Once | INTRAMUSCULAR | Status: AC | PRN

## 2014-07-05 MED ORDER — LORAZEPAM 2 MG/ML IJ SOLN: 1.0000 mg | Freq: Once | INTRAMUSCULAR | Status: AC | PRN

## 2014-07-05 MED ORDER — IBUPROFEN 200 MG PO TABS: 600.0000 mg | ORAL_TABLET | Freq: Three times a day (TID) | ORAL | Status: DC | PRN

## 2014-07-05 NOTE — ED Notes (Signed)
Pt's caregiver called and she sts since discharged from here last time he was doing okay, until he ran out his meds, she sts pt supposed to be seen at [facility] on 28th, but he is getting agitated and violent. She also sts she thinks pt's mother might have gave him medications more then ordered "every time [name] would lash out".

## 2014-07-05 NOTE — ED Provider Notes (Signed)
Medical screening examination/treatment/procedure(s) were performed by non-physician practitioner and as supervising physician I was immediately available for consultation/collaboration.  Salihah Peckham, MD 07/05/14 2021 

## 2014-07-05 NOTE — ED Notes (Signed)
Introduced self to the pt and the pt yells "leave me alone." Pt verbally aggressive talking to hisself in his room. Pt in the bed at this time.
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
## 2014-07-05 NOTE — ED Provider Notes (Signed)
CSN: [id number]     Arrival date & time 07/05/14  1347 History  This chart was scribed for non-physician practitioner working with Toy Cookey, MD by Elveria Rising, ED Scribe. This patient was seen in room WA27/WA27 and the patient's care was started at 2:36 PM.   Chief Complaint  Patient presents with  . IVC'd     The history is provided by the patient and the police.   HPI Comments: Chritopher Coster is a 33 y.o. male with history of Bipolar 1 Disorder and Depression brought in by [facility], who presents to the Emergency Department after being involuntarily committed by [facility] for increased agitations and aggressive behavior. Patient has threatened to hurt his mother and himself.  Per [facility] is not taking his medications. During his exam, patient is non cooperative in answering questions and keeps repeating "Fuck you."    Past Medical History  Diagnosis Date  . Depression     Pt was planning to start taking abilify 10/20  . Traumatic brain injury   . Seizures   . Bipolar 1 disorder    Past Surgical History  Procedure Laterality Date  . Fracture surgery  08/13/2013    L arm and L leg  . Orif humerus fracture Left 08/13/2013    Procedure: OPEN REDUCTION INTERNAL FIXATION (ORIF) HUMERAL SHAFT FRACTURE;  Surgeon: Harvie Junior, MD;  Location: [facility] OR;  Service: Orthopedics;  Laterality: Left;  . Orif tibia plateau Left 08/13/2013    Procedure: OPEN REDUCTION INTERNAL FIXATION (ORIF) TIBIAL PLATEAU;  Surgeon: Harvie Junior, MD;  Location: [facility] OR;  Service: Orthopedics;  Laterality: Left;  . Percutaneous tracheostomy  08/17/2013    Dr. Megan Mans  . Peg w/tracheostomy placement  08/17/2013    Dr. Megan Mans  . Percutaneous tracheostomy N/A 08/17/2013    Procedure: PERCUTANEOUS TRACHEOSTOMY;  Surgeon: Cherylynn Ridges, MD;  Location: [facility] OR;  Service: General;  Laterality: N/A;   No family history on file. History  Substance Use Topics  . Smoking status: Former Games developer  . Smokeless tobacco: Not on file   . Alcohol Use: No    Review of Systems  Level 5 caveat  Allergies  Review of patient's allergies indicates no known allergies.  Home Medications   Prior to Admission medications   Medication Sig Start Date End Date Taking? Authorizing Provider  divalproex (DEPAKOTE) 500 MG DR tablet Take 1 tablet (500 mg total) by mouth every 12 (twelve) hours. 06/23/14   Nanine Means, NP  escitalopram (LEXAPRO) 10 MG tablet Take 1 tablet (10 mg total) by mouth daily. 06/23/14   Nanine Means, NP  LORazepam (ATIVAN) 1 MG tablet Take 1 tablet (1 mg total) by mouth every 8 (eight) hours as needed for anxiety (agitation). 06/23/14   Nanine Means, NP  multivitamin-iron-minerals-folic acid (CENTRUM) chewable tablet Chew 1 tablet by mouth daily. 06/23/14   Nanine Means, NP  QUEtiapine (SEROQUEL) 100 MG tablet Take 1 tablet (100 mg total) by mouth at bedtime. 06/23/14   Nanine Means, NP  QUEtiapine (SEROQUEL) 50 MG tablet Take 1 tablet (50 mg total) by mouth 3 (three) times daily. 06/23/14   Nanine Means, NP  traZODone (DESYREL) 100 MG tablet Take 1 tablet (100 mg total) by mouth at bedtime. 06/23/14   Nanine Means, NP   Triage Vitals: BP 113/67  Pulse 87  Temp(Src) 98.5 F (36.9 C) (Oral)  Resp 18  SpO2 98%  Physical Exam  Nursing note and vitals reviewed. Constitutional: He appears well-developed and  well-nourished. No distress.  HENT:  Head: Normocephalic and atraumatic.  Mouth/Throat: Oropharynx is clear and moist. No oropharyngeal exudate.  Eyes: Conjunctivae and EOM are normal. Pupils are equal, round, and reactive to light. Right eye exhibits no discharge. Left eye exhibits no discharge. No scleral icterus.  Neck: Normal range of motion. Neck supple. No JVD present. No thyromegaly present.  Cardiovascular: Normal rate, regular rhythm, normal heart sounds and intact distal pulses.  Exam reveals no gallop and no friction rub.   No murmur heard. Pulmonary/Chest: Effort normal and breath sounds normal.  No respiratory distress. He has no wheezes. He has no rales.  Abdominal: Soft. Bowel sounds are normal. He exhibits no distension and no mass. There is no tenderness. There is no rebound and no guarding.  Musculoskeletal: Normal range of motion.  Lymphadenopathy:    He has no cervical adenopathy.  Neurological: He is alert.  Skin: Skin is warm and dry. He is not diaphoretic.  Psychiatric: He has a normal mood and affect. His behavior is normal. Judgment and thought content normal.    ED Course  Procedures (including critical care time)  COORDINATION OF CARE: 2:39 PM- Discussed treatment plan with patient at bedside and patient agreed to plan.   Labs Review Labs Reviewed  CBC - Abnormal; Notable for the following:    Platelets 117 (*)    All other components within normal limits  COMPREHENSIVE METABOLIC PANEL - Abnormal; Notable for the following:    AST 46 (*)    All other components within normal limits  SALICYLATE LEVEL - Abnormal; Notable for the following:    Salicylate Lvl <2.0 (*)    All other components within normal limits  ACETAMINOPHEN LEVEL  ETHANOL  URINE RAPID DRUG SCREEN (HOSP PERFORMED)    Imaging Review No results found.   EKG Interpretation None      MDM   Final diagnoses:  Aggression  Bipolar 1 disorder   Patient is a 33 y.o. Male who has IVC papers which were taken out by the family.  Per report patient has been non-compliant with his medications.  Patient had basic labs drawn here.  Labs show no acute abnormalities.  Patient is medically cleared at this time.  Patient placed in a psych hold.  Currently awaiting TTS evaluation.  Appreciate psych input.    I personally performed the services described in this documentation, which was scribed in my presence. The recorded information has been reviewed and is accurate.    Eben Burow, PA-C 07/05/14 1650

## 2014-07-05 NOTE — ED Notes (Addendum)
Pt brought to room 27, he is cursing loudly, constantly repeating "fuck all". He is laying on the bed, watching TV, using foul language aimed at no one in particular. Pt given happy meal and soft drink.

## 2014-07-05 NOTE — ED Notes (Signed)
Unable to assess pt at this time, when asked questions pt only responds with "fuck all".

## 2014-07-05 NOTE — ED Notes (Signed)
Pt brought in by [employer], IVC'd by [facility]. Paperwork states pt with increased agitations and aggressive behavior. Threatening to hurt mother and self. Pt noncompliant with meds.

## 2014-07-05 NOTE — ED Notes (Signed)
Pt refusing vital signs. When asked by NT if vial signs, pt starts to curse at the NT, and states leave me alone.

## 2014-07-05 NOTE — ED Notes (Signed)
Pt observed laying in bed, loudly arguing with characters on TV.

## 2014-07-06 DIAGNOSIS — F911 Conduct disorder, childhood-onset type: Secondary | ICD-10-CM

## 2014-07-06 LAB — RAPID URINE DRUG SCREEN, HOSP PERFORMED
AMPHETAMINES: NOT DETECTED
BENZODIAZEPINES: NOT DETECTED
Barbiturates: NOT DETECTED
Cocaine: NOT DETECTED
Opiates: NOT DETECTED
TETRAHYDROCANNABINOL: NOT DETECTED

## 2014-07-06 LAB — VALPROIC ACID LEVEL: Valproic Acid Lvl: 11.5 ug/mL — ABNORMAL LOW (ref 50.0–100.0)

## 2014-07-06 MED ORDER — LORAZEPAM 2 MG/ML IJ SOLN
2.0000 mg | Freq: Once | INTRAMUSCULAR | Status: AC
  Administered 2014-07-06: 2 mg via INTRAMUSCULAR
  Filled 2014-07-06: qty 1

## 2014-07-06 MED ORDER — DIPHENHYDRAMINE HCL 50 MG/ML IJ SOLN
50.0000 mg | Freq: Once | INTRAMUSCULAR | Status: AC
  Administered 2014-07-06: 50 mg via INTRAMUSCULAR
  Filled 2014-07-06: qty 1

## 2014-07-06 MED ORDER — QUETIAPINE FUMARATE 100 MG PO TABS
100.0000 mg | ORAL_TABLET | Freq: Every day | ORAL | Status: DC
  Administered 2014-07-06 – 2014-07-08 (×3): 100 mg via ORAL
  Filled 2014-07-06 (×3): qty 1

## 2014-07-06 MED ORDER — ZIPRASIDONE MESYLATE 20 MG IM SOLR
20.0000 mg | Freq: Once | INTRAMUSCULAR | Status: AC
  Administered 2014-07-06: 20 mg via INTRAMUSCULAR
  Filled 2014-07-06: qty 20

## 2014-07-06 MED ORDER — DIVALPROEX SODIUM 500 MG PO DR TAB
500.0000 mg | DELAYED_RELEASE_TABLET | Freq: Two times a day (BID) | ORAL | Status: DC
  Administered 2014-07-06 – 2014-07-09 (×6): 500 mg via ORAL
  Filled 2014-07-06 (×7): qty 1

## 2014-07-06 MED ORDER — DIVALPROEX SODIUM 500 MG PO DR TAB
500.0000 mg | DELAYED_RELEASE_TABLET | Freq: Once | ORAL | Status: AC
  Administered 2014-07-06: 500 mg via ORAL

## 2014-07-06 MED ORDER — TRAZODONE HCL 100 MG PO TABS
100.0000 mg | ORAL_TABLET | Freq: Every day | ORAL | Status: DC
  Administered 2014-07-06 – 2014-07-08 (×3): 100 mg via ORAL
  Filled 2014-07-06 (×3): qty 1

## 2014-07-06 MED ORDER — FLUPHENAZINE HCL 2.5 MG/ML IJ SOLN
2.5000 mg | Freq: Four times a day (QID) | INTRAMUSCULAR | Status: DC | PRN
  Administered 2014-07-06 – 2014-07-07 (×2): 2.5 mg via INTRAMUSCULAR
  Filled 2014-07-06 (×4): qty 1

## 2014-07-06 MED ORDER — LORAZEPAM 2 MG/ML IJ SOLN: 2.0000 mg | Freq: Once | INTRAMUSCULAR | Status: DC

## 2014-07-06 MED ORDER — LORAZEPAM 2 MG/ML IJ SOLN
2.0000 mg | Freq: Four times a day (QID) | INTRAMUSCULAR | Status: DC | PRN
  Administered 2014-07-06 – 2014-07-07 (×2): 2 mg via INTRAMUSCULAR
  Filled 2014-07-06 (×2): qty 1

## 2014-07-06 MED ORDER — QUETIAPINE FUMARATE 50 MG PO TABS
50.0000 mg | ORAL_TABLET | Freq: Three times a day (TID) | ORAL | Status: DC
  Administered 2014-07-06 – 2014-07-09 (×9): 50 mg via ORAL
  Filled 2014-07-06 (×4): qty 1
  Filled 2014-07-06: qty 2
  Filled 2014-07-06 (×4): qty 1

## 2014-07-06 MED ORDER — ZIPRASIDONE MESYLATE 20 MG IM SOLR
INTRAMUSCULAR | Status: AC
  Filled 2014-07-06: qty 20

## 2014-07-06 MED ORDER — DIPHENHYDRAMINE HCL 50 MG/ML IJ SOLN: 50.0000 mg | Freq: Four times a day (QID) | INTRAMUSCULAR | Status: DC | PRN

## 2014-07-06 MED ORDER — LORAZEPAM 2 MG/ML IJ SOLN: 2.0000 mg | Freq: Four times a day (QID) | INTRAMUSCULAR | Status: DC | PRN

## 2014-07-06 MED ORDER — ESCITALOPRAM OXALATE 10 MG PO TABS
10.0000 mg | ORAL_TABLET | Freq: Every day | ORAL | Status: DC
  Administered 2014-07-06 – 2014-07-09 (×4): 10 mg via ORAL
  Filled 2014-07-06 (×5): qty 1

## 2014-07-06 NOTE — ED Notes (Signed)
Pt is in his room yelling and cursing at staff and security stating "F**k you," and yelling about his mother and father telling them "to get out of the room". When he saw security walk by he started to hit the glass window with hand becoming more agitated. Will continue to monitor the pt.

## 2014-07-06 NOTE — ED Notes (Addendum)
Pt alert  x 4, resting in bed watching TV, calm and cooperative, sitter at the bedside. Will monitor. Estill Dooms, RN 9:36 PM 07/06/2014

## 2014-07-06 NOTE — ED Notes (Signed)
Patient eat 100

## 2014-07-06 NOTE — ED Notes (Signed)
Bed: WA20 Expected date:  Expected time:  Means of arrival:  Comments: Hold for TCU 

## 2014-07-06 NOTE — ED Notes (Signed)
Sitter states pt is becoming more agitated. Pt cursing and stating he wants to leave.

## 2014-07-06 NOTE — ED Notes (Signed)
Patient ate 100% of his meal. 

## 2014-07-06 NOTE — ED Notes (Addendum)
Pt's mother, Athen Riel can be reached at [phone]. Pt's mother updated on pt's current condition and actions during the night. Pt's mother would like to be called for TTS consult.

## 2014-07-06 NOTE — BH Assessment (Addendum)
[facility] Assessment Progress Note  Pt has been submitted to the following facilities, all of which have declined him due to aggression:  [facility] by Thedore Mins, MD on 9/11 @ 10:00 *[facility] by Gearldine Bienenstock, MD on 9/11 @ 17:21 *[facility] by Robinette Haines on 9/11 @ 17:29 *[facility] by [place] on 9/11 @ 17:35  Doylene Canning, MA Triage Specialist 07/06/2014 @ 17:42

## 2014-07-06 NOTE — ED Notes (Signed)
Unable to obtain urine sample due to pt being incontinent. Pt became increasingly agitated and verbally aggressive with staff when trying to clean the pt. Pt attempting to punch staff and security called to help calm the pt so that peri care could be performed. Pt remains verbally aggressive at this time but is staying in the bed and not attempting to come out of his room.

## 2014-07-06 NOTE — Progress Notes (Signed)
7:45am. CSW called pt's mother to get collateral. Per mother, in past 2-3 days pt has been yelling and cursing at her more than baseline. She states that he has also been delusional, which is not his baseline. She states that he has been telling people that they are dead and that they should be in heaven. Mother states that his increased aggression and his delusional statements is why she brought him to the hospital.   Mother states that pt is compliant with medications and takes them as prescribed. She says she is worried about his sleep, saying that he "fights" his sleep medications--he will start to dose off then "suddenly jump up and start fidgeting."    Mariann Laster,     ED CSW  phone: [phone]

## 2014-07-06 NOTE — ED Notes (Signed)
Patient ate 100% of his lunch.

## 2014-07-06 NOTE — Consult Note (Signed)
[facility] Face-to-Face Psychiatry Consult   Reason for Consult:  Aggression Referring Physician:  EDP  Jacorion Klem is an 33 y.o. male. Total Time spent with patient: 20 minutes  Assessment: AXIS I:  Aggression, psychosis AXIS II:  Deferred AXIS III:   Past Medical History  Diagnosis Date  . Depression     Pt was planning to start taking abilify 10/20  . Traumatic brain injury   . Seizures   . Bipolar 1 disorder    AXIS IV:  other psychosocial or environmental problems, problems related to social environment and problems with primary support group AXIS V:  21-30 behavior considerably influenced by delusions or hallucinations OR serious impairment in judgment, communication OR inability to function in almost all areas  Plan:  Recommend psychiatric Inpatient admission when medically cleared.Dr. Darleene Cleaver assessed the patient and concurs with the plan.  Subjective:   Oryan Winterton is a 33 y.o. male patient admitted with psychosis.  HPI:   HPI Elements:   Location:  generalized. Quality:  acute. Severity:  severe. Timing:  constant. Duration:  few days. Context:  stressors.  Past Psychiatric History: Past Medical History  Diagnosis Date  . Depression     Pt was planning to start taking abilify 10/20  . Traumatic brain injury   . Seizures   . Bipolar 1 disorder     reports that he has quit smoking. He does not have any smokeless tobacco history on file. He reports that he does not drink alcohol or use illicit drugs. No family history on file.         Allergies:  No Known Allergies  ACT Assessment Complete:  Yes:    Educational Status    Risk to Self: Risk to self with the past 6 months Is patient at risk for suicide?: Yes Substance abuse history and/or treatment for substance abuse?: No  Risk to Others:    Abuse:    Prior Inpatient Therapy:    Prior Outpatient Therapy:    Additional Information:                    Objective: Blood pressure 117/67,  pulse 95, temperature 98.9 F (37.2 C), temperature source Oral, resp. rate 20, SpO2 98.00%.There is no weight on file to calculate BMI. Results for orders placed during the hospital encounter of 07/05/14 (from the past 72 hour(s))  ACETAMINOPHEN LEVEL     Status: None   Collection Time    07/05/14  2:14 PM      Result Value Ref Range   Acetaminophen (Tylenol), Serum <15.0  10 - 30 ug/mL   Comment:            THERAPEUTIC CONCENTRATIONS VARY     SIGNIFICANTLY. A RANGE OF 10-30     ug/mL MAY BE AN EFFECTIVE     CONCENTRATION FOR MANY PATIENTS.     HOWEVER, SOME ARE BEST TREATED     AT CONCENTRATIONS OUTSIDE THIS     RANGE.     ACETAMINOPHEN CONCENTRATIONS     >150 ug/mL AT 4 HOURS AFTER     INGESTION AND >50 ug/mL AT 12     HOURS AFTER INGESTION ARE     OFTEN ASSOCIATED WITH TOXIC     REACTIONS.  CBC     Status: Abnormal   Collection Time    07/05/14  2:14 PM      Result Value Ref Range   WBC 6.6  4.0 - 10.5 K/uL   RBC 4.95  4.22 - 5.81 MIL/uL   Hemoglobin 14.7  13.0 - 17.0 g/dL   HCT 42.1  39.0 - 52.0 %   MCV 85.1  78.0 - 100.0 fL   MCH 29.7  26.0 - 34.0 pg   MCHC 34.9  30.0 - 36.0 g/dL   RDW 15.0  11.5 - 15.5 %   Platelets 117 (*) 150 - 400 K/uL   Comment: REPEATED TO VERIFY     SPECIMEN CHECKED FOR CLOTS     PLATELET COUNT CONFIRMED BY SMEAR     LARGE PLATELETS PRESENT  COMPREHENSIVE METABOLIC PANEL     Status: Abnormal   Collection Time    07/05/14  2:14 PM      Result Value Ref Range   Sodium 140  137 - 147 mEq/L   Potassium 4.2  3.7 - 5.3 mEq/L   Chloride 101  96 - 112 mEq/L   CO2 25  19 - 32 mEq/L   Glucose, Bld 94  70 - 99 mg/dL   BUN 14  6 - 23 mg/dL   Creatinine, Ser 0.84  0.50 - 1.35 mg/dL   Calcium 8.6  8.4 - 10.5 mg/dL   Total Protein 8.1  6.0 - 8.3 g/dL   Albumin 3.6  3.5 - 5.2 g/dL   AST 46 (*) 0 - 37 U/L   ALT 35  0 - 53 U/L   Alkaline Phosphatase 76  39 - 117 U/L   Total Bilirubin 0.4  0.3 - 1.2 mg/dL   GFR calc non Af Amer >90  >90 mL/min    GFR calc Af Amer >90  >90 mL/min   Comment: (NOTE)     The eGFR has been calculated using the CKD EPI equation.     This calculation has not been validated in all clinical situations.     eGFR's persistently <90 mL/min signify possible Chronic Kidney     Disease.   Anion gap 14  5 - 15  ETHANOL     Status: None   Collection Time    07/05/14  2:14 PM      Result Value Ref Range   Alcohol, Ethyl (B) <11  0 - 11 mg/dL   Comment:            LOWEST DETECTABLE LIMIT FOR     SERUM ALCOHOL IS 11 mg/dL     FOR MEDICAL PURPOSES ONLY  SALICYLATE LEVEL     Status: Abnormal   Collection Time    07/05/14  2:14 PM      Result Value Ref Range   Salicylate Lvl <9.3 (*) 2.8 - 20.0 mg/dL   Labs are reviewed and are pertinent for no medical issues noted.  Current Facility-Administered Medications  Medication Dose Route Frequency Provider Last Rate Last Dose  . acetaminophen (TYLENOL) tablet 650 mg  650 mg Oral Q4H PRN Courtney A Forcucci, PA-C      . ibuprofen (ADVIL,MOTRIN) tablet 600 mg  600 mg Oral Q8H PRN Courtney A Forcucci, PA-C      . nicotine (NICODERM CQ - dosed in mg/24 hours) patch 21 mg  21 mg Transdermal Daily Courtney A Forcucci, PA-C   21 mg at 07/05/14 1730  . ondansetron (ZOFRAN) tablet 4 mg  4 mg Oral Q8H PRN Courtney A Forcucci, PA-C      . zolpidem (AMBIEN) tablet 5 mg  5 mg Oral QHS PRN Cherylann Parr, PA-C       Current Outpatient Prescriptions  Medication Sig Dispense  Refill  . divalproex (DEPAKOTE) 500 MG DR tablet Take 1 tablet (500 mg total) by mouth every 12 (twelve) hours.  60 tablet  0  . escitalopram (LEXAPRO) 10 MG tablet Take 1 tablet (10 mg total) by mouth daily.  30 tablet  3  . LORazepam (ATIVAN) 1 MG tablet Take 1 tablet (1 mg total) by mouth every 8 (eight) hours as needed for anxiety (agitation).  30 tablet  0  . multivitamin-iron-minerals-folic acid (CENTRUM) chewable tablet Chew 1 tablet by mouth daily.      . QUEtiapine (SEROQUEL) 100 MG tablet Take 1  tablet (100 mg total) by mouth at bedtime.  30 tablet  0  . QUEtiapine (SEROQUEL) 50 MG tablet Take 1 tablet (50 mg total) by mouth 3 (three) times daily.  90 tablet  0  . traZODone (DESYREL) 100 MG tablet Take 1 tablet (100 mg total) by mouth at bedtime.        Psychiatric Specialty Exam:     Blood pressure 117/67, pulse 95, temperature 98.9 F (37.2 C), temperature source Oral, resp. rate 20, SpO2 98.00%.There is no weight on file to calculate BMI.  General Appearance: Casual  Eye Contact::  Minimal  Speech:  Pressured  Volume:  Increased  Mood:  Angry, Anxious and Irritable  Affect:  Congruent  Thought Process:  Disorganized  Orientation:  Full (Time, Place, and Person)  Thought Content:  Delusions and Hallucinations: Auditory  Suicidal Thoughts:  No  Homicidal Thoughts:  No  Memory:  Immediate;   Fair Recent;   Poor Remote;   Fair  Judgement:  Poor  Insight:  Lacking  Psychomotor Activity:  Increased  Concentration:  Poor  Recall:  [place]: Fair  Akathisia:  No  Handed:  Right  AIMS (if indicated):     Assets:  Catering manager Housing Leisure Time Resilience Social Support  Sleep:      Musculoskeletal: Strength & Muscle Tone: within normal limits Gait & Station: normal Patient leans: N/A  Treatment Plan Summary: Daily contact with patient to assess and evaluate symptoms and progress in treatment Medication management; admit to inpatient psychiatry for stabilization  Waylan Boga, PMH-NP 07/06/2014 11:55 AM  Patient seen, evaluated and I agree with notes by Nurse Practitioner. Corena Pilgrim, MD

## 2014-07-07 ENCOUNTER — Encounter (HOSPITAL_COMMUNITY): Payer: Self-pay | Admitting: Psychiatry

## 2014-07-07 DIAGNOSIS — F911 Conduct disorder, childhood-onset type: Secondary | ICD-10-CM

## 2014-07-07 DIAGNOSIS — F29 Unspecified psychosis not due to a substance or known physiological condition: Secondary | ICD-10-CM

## 2014-07-07 NOTE — ED Notes (Signed)
Patient ate 25% of his meal 

## 2014-07-07 NOTE — Consult Note (Signed)
[facility] Face-to-Face Psychiatry Consult   Reason for Consult:  Aggression Referring Physician:  EDP  Cory [name] is an 33 y.o. male. Total Time spent with patient: 20 minutes  Assessment: AXIS I:  Aggression, psychosis AXIS II:  Deferred AXIS III:   Past Medical History  Diagnosis Date  . Depression     Pt was planning to start taking abilify 10/20  . Traumatic brain injury   . Seizures   . Bipolar 1 disorder    AXIS IV:  other psychosocial or environmental problems, problems related to social environment and problems with primary support group AXIS V:  21-30 behavior considerably influenced by delusions or hallucinations OR serious impairment in judgment, communication OR inability to function in almost all areas  Plan:  Recommend psychiatric Inpatient admission when medically cleared.Dr. Louretta Shorten assessed the patient and concurs with the plan.  Subjective:   Cory [name] is a 33 y.o. male patient admitted with psychosis.  HPI:  Patient had a quiet night but started getting agitated this am prior to his medications.  After his medications this am, he was calm and has remained calm.  The patient appears to be stabilizing on his medications.  Continue to observe for baseline. HPI Elements:   Location:  generalized. Quality:  acute. Severity:  severe. Timing:  constant. Duration:  few days. Context:  stressors.  Past Psychiatric History: Past Medical History  Diagnosis Date  . Depression     Pt was planning to start taking abilify 10/20  . Traumatic brain injury   . Seizures   . Bipolar 1 disorder     reports that he has quit smoking. He does not have any smokeless tobacco history on file. He reports that he does not drink alcohol or use illicit drugs. History reviewed. No pertinent family history.         Allergies:  No Known Allergies  ACT Assessment Complete:  Yes:    Educational Status    Risk to Self: Risk to self with the past 6 months Is patient at risk  for suicide?: Yes Substance abuse history and/or treatment for substance abuse?: No  Risk to Others:    Abuse:    Prior Inpatient Therapy:    Prior Outpatient Therapy:    Additional Information:                    Objective: Blood pressure 111/68, pulse 106, temperature 98.2 F (36.8 C), temperature source Oral, resp. rate 18, SpO2 97.00%.There is no weight on file to calculate BMI. Results for orders placed during the hospital encounter of 07/05/14 (from the past 72 hour(s))  ACETAMINOPHEN LEVEL     Status: None   Collection Time    07/05/14  2:14 PM      Result Value Ref Range   Acetaminophen (Tylenol), Serum <15.0  10 - 30 ug/mL   Comment:            THERAPEUTIC CONCENTRATIONS VARY     SIGNIFICANTLY. A RANGE OF 10-30     ug/mL MAY BE AN EFFECTIVE     CONCENTRATION FOR MANY PATIENTS.     HOWEVER, SOME ARE BEST TREATED     AT CONCENTRATIONS OUTSIDE THIS     RANGE.     ACETAMINOPHEN CONCENTRATIONS     >150 ug/mL AT 4 HOURS AFTER     INGESTION AND >50 ug/mL AT 12     HOURS AFTER INGESTION ARE     OFTEN ASSOCIATED WITH TOXIC  REACTIONS.  CBC     Status: Abnormal   Collection Time    07/05/14  2:14 PM      Result Value Ref Range   WBC 6.6  4.0 - 10.5 K/uL   RBC 4.95  4.22 - 5.81 MIL/uL   Hemoglobin 14.7  13.0 - 17.0 g/dL   HCT 42.1  39.0 - 52.0 %   MCV 85.1  78.0 - 100.0 fL   MCH 29.7  26.0 - 34.0 pg   MCHC 34.9  30.0 - 36.0 g/dL   RDW 15.0  11.5 - 15.5 %   Platelets 117 (*) 150 - 400 K/uL   Comment: REPEATED TO VERIFY     SPECIMEN CHECKED FOR CLOTS     PLATELET COUNT CONFIRMED BY SMEAR     LARGE PLATELETS PRESENT  COMPREHENSIVE METABOLIC PANEL     Status: Abnormal   Collection Time    07/05/14  2:14 PM      Result Value Ref Range   Sodium 140  137 - 147 mEq/L   Potassium 4.2  3.7 - 5.3 mEq/L   Chloride 101  96 - 112 mEq/L   CO2 25  19 - 32 mEq/L   Glucose, Bld 94  70 - 99 mg/dL   BUN 14  6 - 23 mg/dL   Creatinine, Ser 0.84  0.50 - 1.35 mg/dL    Calcium 8.6  8.4 - 10.5 mg/dL   Total Protein 8.1  6.0 - 8.3 g/dL   Albumin 3.6  3.5 - 5.2 g/dL   AST 46 (*) 0 - 37 U/L   ALT 35  0 - 53 U/L   Alkaline Phosphatase 76  39 - 117 U/L   Total Bilirubin 0.4  0.3 - 1.2 mg/dL   GFR calc non Af Amer >90  >90 mL/min   GFR calc Af Amer >90  >90 mL/min   Comment: (NOTE)     The eGFR has been calculated using the CKD EPI equation.     This calculation has not been validated in all clinical situations.     eGFR's persistently <90 mL/min signify possible Chronic Kidney     Disease.   Anion gap 14  5 - 15  ETHANOL     Status: None   Collection Time    07/05/14  2:14 PM      Result Value Ref Range   Alcohol, Ethyl (B) <11  0 - 11 mg/dL   Comment:            LOWEST DETECTABLE LIMIT FOR     SERUM ALCOHOL IS 11 mg/dL     FOR MEDICAL PURPOSES ONLY  SALICYLATE LEVEL     Status: Abnormal   Collection Time    07/05/14  2:14 PM      Result Value Ref Range   Salicylate Lvl <7.4 (*) 2.8 - 20.0 mg/dL  VALPROIC ACID LEVEL     Status: Abnormal   Collection Time    07/06/14 12:07 PM      Result Value Ref Range   Valproic Acid Lvl 11.5 (*) 50.0 - 100.0 ug/mL   Comment: Performed at [facility] ([place])     Status: None   Collection Time    07/06/14  2:29 PM      Result Value Ref Range   Opiates NONE DETECTED  NONE DETECTED   Cocaine NONE DETECTED  NONE DETECTED   Benzodiazepines NONE DETECTED  NONE DETECTED  Amphetamines NONE DETECTED  NONE DETECTED   Tetrahydrocannabinol NONE DETECTED  NONE DETECTED   Barbiturates NONE DETECTED  NONE DETECTED   Comment:            DRUG SCREEN FOR MEDICAL PURPOSES     ONLY.  IF CONFIRMATION IS NEEDED     FOR ANY PURPOSE, NOTIFY LAB     WITHIN 5 DAYS.                LOWEST DETECTABLE LIMITS     FOR URINE DRUG SCREEN     Drug Class       Cutoff (ng/mL)     Amphetamine      1000     Barbiturate      200     Benzodiazepine   557     Tricyclics       322     Opiates           300     Cocaine          300     THC              50   Labs are reviewed and are pertinent for no medical issues noted.  Current Facility-Administered Medications  Medication Dose Route Frequency Provider Last Rate Last Dose  . acetaminophen (TYLENOL) tablet 650 mg  650 mg Oral Q4H PRN Courtney A Forcucci, PA-C      . diphenhydrAMINE (BENADRYL) injection 50 mg  50 mg Intramuscular Q6H PRN Waylan Boga, NP      . divalproex (DEPAKOTE) DR tablet 500 mg  500 mg Oral Q12H Waylan Boga, NP   500 mg at 07/07/14 0803  . escitalopram (LEXAPRO) tablet 10 mg  10 mg Oral Daily Waylan Boga, NP   10 mg at 07/07/14 1011  . fluPHENAZine (PROLIXIN) injection 2.5 mg  2.5 mg Intramuscular Q6H PRN Waylan Boga, NP   2.5 mg at 07/07/14 1011  . ibuprofen (ADVIL,MOTRIN) tablet 600 mg  600 mg Oral Q8H PRN Courtney A Forcucci, PA-C      . LORazepam (ATIVAN) injection 2 mg  2 mg Intramuscular Q6H PRN Waylan Boga, NP   2 mg at 07/07/14 0300  . nicotine (NICODERM CQ - dosed in mg/24 hours) patch 21 mg  21 mg Transdermal Daily Courtney A Forcucci, PA-C   21 mg at 07/07/14 1015  . ondansetron (ZOFRAN) tablet 4 mg  4 mg Oral Q8H PRN Courtney A Forcucci, PA-C      . QUEtiapine (SEROQUEL) tablet 100 mg  100 mg Oral QHS Waylan Boga, NP   100 mg at 07/06/14 2124  . QUEtiapine (SEROQUEL) tablet 50 mg  50 mg Oral TID Waylan Boga, NP   50 mg at 07/07/14 1309  . traZODone (DESYREL) tablet 100 mg  100 mg Oral QHS Waylan Boga, NP   100 mg at 07/06/14 2124  . zolpidem (AMBIEN) tablet 5 mg  5 mg Oral QHS PRN Cherylann Parr, PA-C       Current Outpatient Prescriptions  Medication Sig Dispense Refill  . divalproex (DEPAKOTE) 500 MG DR tablet Take 1 tablet (500 mg total) by mouth every 12 (twelve) hours.  60 tablet  0  . escitalopram (LEXAPRO) 10 MG tablet Take 1 tablet (10 mg total) by mouth daily.  30 tablet  3  . LORazepam (ATIVAN) 1 MG tablet Take 1 tablet (1 mg total) by mouth every 8 (eight) hours as needed  for anxiety (agitation).  30 tablet  0  . multivitamin-iron-minerals-folic acid (CENTRUM) chewable tablet Chew 1 tablet by mouth daily.      . QUEtiapine (SEROQUEL) 100 MG tablet Take 1 tablet (100 mg total) by mouth at bedtime.  30 tablet  0  . QUEtiapine (SEROQUEL) 50 MG tablet Take 1 tablet (50 mg total) by mouth 3 (three) times daily.  90 tablet  0  . traZODone (DESYREL) 100 MG tablet Take 1 tablet (100 mg total) by mouth at bedtime.        Psychiatric Specialty Exam:     Blood pressure 111/68, pulse 106, temperature 98.2 F (36.8 C), temperature source Oral, resp. rate 18, SpO2 97.00%.There is no weight on file to calculate BMI.  General Appearance: Casual  Eye Contact::  Minimal  Speech:  Pressured  Volume:  Increased  Mood:  Angry, Anxious and Irritable at times  Affect:  Congruent  Thought Process:  Clear and coherent  Orientation:  Full (Time, Place, and Person)  Thought Content:  Improving  Suicidal Thoughts:  No  Homicidal Thoughts:  No  Memory:  Immediate;   Fair Recent;   Poor Remote;   Fair  Judgement:  Fair  Insight:  Lacking  Psychomotor Activity:  Increased  Concentration:  Poor  Recall:  [place]: Fair  Akathisia:  No  Handed:  Right  AIMS (if indicated):     Assets:  Catering manager Housing Leisure Time Resilience Social Support  Sleep:      Musculoskeletal: Strength & Muscle Tone: within normal limits Gait & Station: normal Patient leans: N/A  Treatment Plan Summary: Daily contact with patient to assess and evaluate symptoms and progress in treatment Medication management; admit to inpatient psychiatry for stabilization or discharge home if he becomes stabilized in the ED.  Waylan Boga, [place] 07/07/2014 2:29 PM  Patient is seen face-to-face for psychiatric evaluation and case discussed with physician extender and treatment team, and formulated treatment plan. Reviewed the information documented  and agree with the treatment plan.  Shakur Lembo,JANARDHAHA R. 07/07/2014 6:15 PM

## 2014-07-07 NOTE — ED Notes (Signed)
Pt's multivitamin (home med) placed in locker

## 2014-07-07 NOTE — ED Notes (Signed)
Pt has become increasingly restless, thrashing around in the bed, telling staff members to get out of his room. Taking to people that are not present. After many attempts to reorient the pt,  of Ativan has been giving. Sitter at the bedside will continue to monitor. Estill Dooms, RN 07/07/2014 4:35 AM

## 2014-07-07 NOTE — BH Assessment (Signed)
Pt has been declined at [facility] by Thedore Mins, MD due to aggression. Contacted the following facilities for placement:  AT CAPACITY: [facility], per [facility], per [facility], per [facility], per [facility], per [facility], per [facility], per [facility], per [facility], per [facility], per [facility], per [facility], per [facility], per [facility], per [facility].  MULTIPLE ATTEMPTS TO CONTACT WITH NO RESPONSE: [facility]  PT DECLINED: [facility] [address], [place], [facility] Triage Specialist [phone]

## 2014-07-07 NOTE — Progress Notes (Signed)
0833 Call placed to Sonya at [facility] to obtain authori1610ation number for [facility] referral.  Authorization number is [id number] good [id number].  [phone] Spoke with Brett Canales at [facility] and gave pt's demographic information.  [facility] application has been completed and faxed.    Tomi Bamberger Disposition MHT

## 2014-07-07 NOTE — ED Notes (Signed)
Please call  MS Deborah Dondero ( The Pt's Mother) at [phone]. To inform her of any information and or updates regarding the Pt's care. [facility]

## 2014-07-07 NOTE — ED Notes (Signed)
Pt starting to become agitated . Using profanity and making sexual comments. Pt assisted to use bathroom by tech. notifed pharmacy

## 2014-07-07 NOTE — ED Notes (Signed)
Patient resting in bed; no s/s of distress noted. Pt with sitter at bedside for safety. Pt cursing staff as he speaks. Pt labile and agitated.

## 2014-07-08 ENCOUNTER — Encounter (HOSPITAL_COMMUNITY): Payer: Self-pay | Admitting: Registered Nurse

## 2014-07-08 NOTE — BH Assessment (Signed)
Pt has been declined at [facility] by Thedore Mins, MD due to aggression. Contacted the following facilities for placement:  PT ON WAIT LIST: [facility], per [facility]  AT CAPACITY:  [facility], per Earl Lites, per [facility], per [facility], per [facility], per [facility], per [facility], per [facility], per [facility], per [facility], per [facility], per [facility], per [facility]  [facility], per [facility], per [facility], per [facility]   PT DECLINED:  [employer]  [facility] [address], [place], [facility]  Triage Specialist  [phone]

## 2014-07-08 NOTE — Consult Note (Signed)
[facility] Follow Up Psychiatry Consult   Reason for Consult:  Aggression Referring Physician:  EDP  Cory [name] is an 33 y.o. male. Total Time spent with patient: 20 minutes  Assessment: AXIS I:  Aggression, psychosis AXIS II:  Deferred AXIS III:   Past Medical History  Diagnosis Date  . Depression     Pt was planning to start taking abilify 10/20  . Traumatic brain injury   . Seizures   . Bipolar 1 disorder    AXIS IV:  other psychosocial or environmental problems, problems related to social environment and problems with primary support group AXIS V:  21-30 behavior considerably influenced by delusions or hallucinations OR serious impairment in judgment, communication OR inability to function in almost all areas  Plan:  Monitor over night reassess in morningDr. Valborg Friar assessed the patient and concurs with the plan.  Subjective:   Cory [name] is a 33 y.o. male patient admitted with psychosis.  HPI: Patient has calm and has not been aggressive.  Patient continues to say "Leave me alone."  Patient mother states that she only want patient stabilized but doesn't want him to go to the hospital.  Only wanted to get patient to calm down.  Informed that patient has been resting and continues to tell staff to leave him a lone but no violence      HPI Elements:   Location:  generalized. Quality:  acute. Severity:  severe. Timing:  constant. Duration:  few days. Context:  stressors.  Past Psychiatric History: Past Medical History  Diagnosis Date  . Depression     Pt was planning to start taking abilify 10/20  . Traumatic brain injury   . Seizures   . Bipolar 1 disorder     reports that he has quit smoking. He does not have any smokeless tobacco history on file. He reports that he does not drink alcohol or use illicit drugs. History reviewed. No pertinent family history.         Allergies:  No Known Allergies  ACT Assessment Complete:  Yes:    Educational Status     Risk to Self: Risk to self with the past 6 months Is patient at risk for suicide?: Yes Substance abuse history and/or treatment for substance abuse?: No  Risk to Others:    Abuse:    Prior Inpatient Therapy:    Prior Outpatient Therapy:    Additional Information:       Objective: Blood pressure 111/63, pulse 99, temperature 98.3 F (36.8 C), temperature source Oral, resp. rate 18, SpO2 97.00%.There is no weight on file to calculate BMI. Results for orders placed during the hospital encounter of 07/05/14 (from the past 72 hour(s))  VALPROIC ACID LEVEL     Status: Abnormal   Collection Time    07/06/14 12:07 PM      Result Value Ref Range   Valproic Acid Lvl 11.5 (*) 50.0 - 100.0 ug/mL   Comment: Performed at [facility]  URINE RAPID DRUG SCREEN (HOSP PERFORMED)     Status: None   Collection Time    07/06/14  2:29 PM      Result Value Ref Range   Opiates NONE DETECTED  NONE DETECTED   Cocaine NONE DETECTED  NONE DETECTED   Benzodiazepines NONE DETECTED  NONE DETECTED   Amphetamines NONE DETECTED  NONE DETECTED   Tetrahydrocannabinol NONE DETECTED  NONE DETECTED   Barbiturates NONE DETECTED  NONE DETECTED   Comment:  DRUG SCREEN FOR MEDICAL PURPOSES     ONLY.  IF CONFIRMATION IS NEEDED     FOR ANY PURPOSE, NOTIFY LAB     WITHIN 5 DAYS.                LOWEST DETECTABLE LIMITS     FOR URINE DRUG SCREEN     Drug Class       Cutoff (ng/mL)     Amphetamine      1000     Barbiturate      200     Benzodiazepine   200     Tricyclics       300     Opiates          300     Cocaine          300     THC              50   Labs are reviewed and are pertinent for no medical issues noted.  Current Facility-Administered Medications  Medication Dose Route Frequency Provider Last Rate Last Dose  . acetaminophen (TYLENOL) tablet 650 mg  650 mg Oral Q4H PRN Courtney A Forcucci, PA-C      . diphenhydrAMINE (BENADRYL) injection 50 mg  50 mg Intramuscular Q6H PRN Nanine Means, NP      . divalproex (DEPAKOTE) DR tablet 500 mg  500 mg Oral Q12H Nanine Means, NP   500 mg at 07/08/14 0903  . escitalopram (LEXAPRO) tablet 10 mg  10 mg Oral Daily Nanine Means, NP   10 mg at 07/08/14 0904  . fluPHENAZine (PROLIXIN) injection 2.5 mg  2.5 mg Intramuscular Q6H PRN Nanine Means, NP   2.5 mg at 07/07/14 1011  . ibuprofen (ADVIL,MOTRIN) tablet 600 mg  600 mg Oral Q8H PRN Courtney A Forcucci, PA-C      . LORazepam (ATIVAN) injection 2 mg  2 mg Intramuscular Q6H PRN Nanine Means, NP   2 mg at 07/07/14 0300  . nicotine (NICODERM CQ - dosed in mg/24 hours) patch 21 mg  21 mg Transdermal Daily Courtney A Forcucci, PA-C   21 mg at 07/07/14 1015  . ondansetron (ZOFRAN) tablet 4 mg  4 mg Oral Q8H PRN Courtney A Forcucci, PA-C      . QUEtiapine (SEROQUEL) tablet 100 mg  100 mg Oral QHS Nanine Means, NP   100 mg at 07/07/14 2101  . QUEtiapine (SEROQUEL) tablet 50 mg  50 mg Oral TID Nanine Means, NP   50 mg at 07/08/14 1214  . traZODone (DESYREL) tablet 100 mg  100 mg Oral QHS Nanine Means, NP   100 mg at 07/07/14 2100  . zolpidem (AMBIEN) tablet 5 mg  5 mg Oral QHS PRN Eben Burow, PA-C       Current Outpatient Prescriptions  Medication Sig Dispense Refill  . divalproex (DEPAKOTE) 500 MG DR tablet Take 1 tablet (500 mg total) by mouth every 12 (twelve) hours.  60 tablet  0  . escitalopram (LEXAPRO) 10 MG tablet Take 1 tablet (10 mg total) by mouth daily.  30 tablet  3  . LORazepam (ATIVAN) 1 MG tablet Take 1 tablet (1 mg total) by mouth every 8 (eight) hours as needed for anxiety (agitation).  30 tablet  0  . multivitamin-iron-minerals-folic acid (CENTRUM) chewable tablet Chew 1 tablet by mouth daily.      . QUEtiapine (SEROQUEL) 100 MG tablet Take 1 tablet (100 mg total) by mouth at bedtime.  30 tablet  0  . QUEtiapine (SEROQUEL) 50 MG tablet Take 1 tablet (50 mg total) by mouth 3 (three) times daily.  90 tablet  0  . traZODone (DESYREL) 100 MG tablet Take 1 tablet (100 mg  total) by mouth at bedtime.        Psychiatric Specialty Exam:     Blood pressure 111/63, pulse 99, temperature 98.3 F (36.8 C), temperature source Oral, resp. rate 18, SpO2 97.00%.There is no weight on file to calculate BMI.  General Appearance: Casual  Eye Contact::  Minimal  Speech:  Pressured  Volume:  Increased  Mood:  Irritable at times  Affect:  Congruent  Thought Process:  Clear and coherent  Orientation:  Full (Time, Place, and Person)  Thought Content:  Improving  Suicidal Thoughts:  No  Homicidal Thoughts:  No  Memory:  Immediate;   Fair Recent;   Poor Remote;   Fair  Judgement:  Fair  Insight:  Lacking  Psychomotor Activity:  Normal  Concentration:  Poor  Recall:  [employer] of Knowledge:Fair  Language: Fair  Akathisia:  No  Handed:  Right  AIMS (if indicated):     Assets:  Health and safety inspector Housing Leisure Time Resilience Social Support  Sleep:      Musculoskeletal: Strength & Muscle Tone: within normal limits Gait & Station: normal Patient leans: N/A  Treatment Plan Summary: Monitor patient overnight; if no violent episodes patient can be discharged home to mother with outpatient resources   Assunta Found, FNP-BC 07/08/2014 5:17 PM  Patient is in face-to-face for psychiatric evaluation, case discussed with the treatment team and a physician extender and formulated treatment plan.Reviewed the information documented and agree with the treatment plan.  Madsen Riddle,JANARDHAHA R. 07/08/2014 5:45 PM
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
## 2014-07-08 NOTE — ED Notes (Signed)
Pt mother named Franklyn Lor called and wanted to speak to the Child psychotherapist. Gave her the mothers phone number to tressa the Child psychotherapist.

## 2014-07-08 NOTE — Progress Notes (Signed)
1251 per [place] at [facility] pt on wait list.   Tomi Bamberger Disposition MHT

## 2014-07-08 NOTE — ED Notes (Signed)
Patient resting in bed; no s/s of distress noted at this time. Patient with sitter at bedside for safety.

## 2014-07-09 ENCOUNTER — Encounter (HOSPITAL_COMMUNITY): Payer: Self-pay | Admitting: Psychiatry

## 2014-07-09 DIAGNOSIS — F603 Borderline personality disorder: Secondary | ICD-10-CM

## 2014-07-09 DIAGNOSIS — F29 Unspecified psychosis not due to a substance or known physiological condition: Secondary | ICD-10-CM

## 2014-07-09 LAB — VALPROIC ACID LEVEL: Valproic Acid Lvl: 78.4 ug/mL (ref 50.0–100.0)

## 2014-07-09 MED ORDER — ESCITALOPRAM OXALATE 10 MG PO TABS: 10.0000 mg | ORAL_TABLET | Freq: Every day | ORAL | Status: AC

## 2014-07-09 MED ORDER — CENTRUM PO CHEW: 1.0000 | CHEWABLE_TABLET | Freq: Every day | ORAL | Status: AC

## 2014-07-09 MED ORDER — TRAZODONE HCL 100 MG PO TABS: 100.0000 mg | ORAL_TABLET | Freq: Every day | ORAL | Status: AC

## 2014-07-09 MED ORDER — DIVALPROEX SODIUM 500 MG PO DR TAB: 500.0000 mg | DELAYED_RELEASE_TABLET | Freq: Two times a day (BID) | ORAL | Status: AC

## 2014-07-09 MED ORDER — QUETIAPINE FUMARATE 50 MG PO TABS: 50.0000 mg | ORAL_TABLET | Freq: Four times a day (QID) | ORAL | Status: AC

## 2014-07-09 NOTE — Consult Note (Signed)
[facility] Face-to-Face Psychiatry Consult   Reason for Consult:  Aggression Referring Physician:  EDP  Cory [name] is an 33 y.o. male. Total Time spent with patient: 20 minutes  Assessment: AXIS I:  Aggression, psychosis AXIS II:  Deferred AXIS III:   Past Medical History  Diagnosis Date  . Depression     Pt was planning to start taking abilify 10/20  . Traumatic brain injury   . Seizures   . Bipolar 1 disorder    AXIS IV:  other psychosocial or environmental problems, problems related to social environment and problems with primary support group AXIS V:  70; mild symptoms  Plan:  Does not warrant admission at this time, Dr. Jannifer Franklin assessed the patient and concurs with the plan.  Subjective:   Cory [name] is a 33 y.o. male patient does not warrant admission.  HPI:  Patient had a quiet night and morning.  He curses at the nurses when they give him his medications but he takes it.  Crystian is at his baseline at this time and will return home to continue to live with his mother, who did not want him hospitalized--just stable to return home. HPI Elements:   Location:  generalized. Quality:  acute. Severity:  severe. Timing:  constant. Duration:  few days. Context:  stressors.  Past Psychiatric History: Past Medical History  Diagnosis Date  . Depression     Pt was planning to start taking abilify 10/20  . Traumatic brain injury   . Seizures   . Bipolar 1 disorder     reports that he has quit smoking. He does not have any smokeless tobacco history on file. He reports that he does not drink alcohol or use illicit drugs. History reviewed. No pertinent family history.         Allergies:  No Known Allergies  ACT Assessment Complete:  Yes:    Educational Status    Risk to Self: Risk to self with the past 6 months Is patient at risk for suicide?: Yes Substance abuse history and/or treatment for substance abuse?: No  Risk to Others:    Abuse:    Prior Inpatient Therapy:     Prior Outpatient Therapy:    Additional Information:                    Objective: Blood pressure 111/63, pulse 99, temperature 98.3 F (36.8 C), temperature source Oral, resp. rate 18, SpO2 97.00%.There is no weight on file to calculate BMI. Results for orders placed during the hospital encounter of 07/05/14 (from the past 72 hour(s))  URINE RAPID DRUG SCREEN (HOSP PERFORMED)     Status: None   Collection Time    07/06/14  2:29 PM      Result Value Ref Range   Opiates NONE DETECTED  NONE DETECTED   Cocaine NONE DETECTED  NONE DETECTED   Benzodiazepines NONE DETECTED  NONE DETECTED   Amphetamines NONE DETECTED  NONE DETECTED   Tetrahydrocannabinol NONE DETECTED  NONE DETECTED   Barbiturates NONE DETECTED  NONE DETECTED   Comment:            DRUG SCREEN FOR MEDICAL PURPOSES     ONLY.  IF CONFIRMATION IS NEEDED     FOR ANY PURPOSE, NOTIFY LAB     WITHIN 5 DAYS.                LOWEST DETECTABLE LIMITS     FOR URINE DRUG SCREEN     Drug  Class       Cutoff (ng/mL)     Amphetamine      1000     Barbiturate      200     Benzodiazepine   200     Tricyclics       300     Opiates          300     Cocaine          300     THC              50   Labs are reviewed and are pertinent for no medical issues noted.  Current Facility-Administered Medications  Medication Dose Route Frequency Provider Last Rate Last Dose  . acetaminophen (TYLENOL) tablet 650 mg  650 mg Oral Q4H PRN Courtney A Forcucci, PA-C      . diphenhydrAMINE (BENADRYL) injection 50 mg  50 mg Intramuscular Q6H PRN Nanine Means, NP      . divalproex (DEPAKOTE) DR tablet 500 mg  500 mg Oral Q12H Nanine Means, NP   500 mg at 07/09/14 0858  . escitalopram (LEXAPRO) tablet 10 mg  10 mg Oral Daily Nanine Means, NP   10 mg at 07/09/14 0857  . fluPHENAZine (PROLIXIN) injection 2.5 mg  2.5 mg Intramuscular Q6H PRN Nanine Means, NP   2.5 mg at 07/07/14 1011  . ibuprofen (ADVIL,MOTRIN) tablet 600 mg  600 mg Oral Q8H  PRN Courtney A Forcucci, PA-C      . LORazepam (ATIVAN) injection 2 mg  2 mg Intramuscular Q6H PRN Nanine Means, NP   2 mg at 07/07/14 0300  . nicotine (NICODERM CQ - dosed in mg/24 hours) patch 21 mg  21 mg Transdermal Daily Courtney A Forcucci, PA-C   21 mg at 07/07/14 1015  . ondansetron (ZOFRAN) tablet 4 mg  4 mg Oral Q8H PRN Courtney A Forcucci, PA-C      . QUEtiapine (SEROQUEL) tablet 100 mg  100 mg Oral QHS Nanine Means, NP   100 mg at 07/08/14 2104  . QUEtiapine (SEROQUEL) tablet 50 mg  50 mg Oral TID Nanine Means, NP   50 mg at 07/09/14 0858  . traZODone (DESYREL) tablet 100 mg  100 mg Oral QHS Nanine Means, NP   100 mg at 07/08/14 2103  . zolpidem (AMBIEN) tablet 5 mg  5 mg Oral QHS PRN Eben Burow, PA-C       Current Outpatient Prescriptions  Medication Sig Dispense Refill  . divalproex (DEPAKOTE) 500 MG DR tablet Take 1 tablet (500 mg total) by mouth every 12 (twelve) hours.  60 tablet  0  . escitalopram (LEXAPRO) 10 MG tablet Take 1 tablet (10 mg total) by mouth daily.  30 tablet  3  . LORazepam (ATIVAN) 1 MG tablet Take 1 tablet (1 mg total) by mouth every 8 (eight) hours as needed for anxiety (agitation).  30 tablet  0  . multivitamin-iron-minerals-folic acid (CENTRUM) chewable tablet Chew 1 tablet by mouth daily.      . QUEtiapine (SEROQUEL) 100 MG tablet Take 1 tablet (100 mg total) by mouth at bedtime.  30 tablet  0  . QUEtiapine (SEROQUEL) 50 MG tablet Take 1 tablet (50 mg total) by mouth 3 (three) times daily.  90 tablet  0  . traZODone (DESYREL) 100 MG tablet Take 1 tablet (100 mg total) by mouth at bedtime.        Psychiatric Specialty Exam:     Blood pressure 111/63,  pulse 99, temperature 98.3 F (36.8 C), temperature source Oral, resp. rate 18, SpO2 97.00%.There is no weight on file to calculate BMI.  General Appearance: Casual  Eye Contact::  Fair  Speech:  Normal   Volume:  Normal   Mood:  Irritable at times  Affect:  Congruent  Thought Process:  Clear  and coherent  Orientation:  Full (Time, Place, and Person)  Thought Content:  Coherent  Suicidal Thoughts:  No  Homicidal Thoughts:  No  Memory:  Fair  Judgement:  Fair  Insight:  Lacking  Psychomotor Activity:  Normal  Concentration:  Fair  Recall:  Fair  Fund of Knowledge:Fair  Language: Fair  Akathisia:  No  Handed:  Right  AIMS (if indicated):     Assets:  Health and safety inspector Housing Leisure Time Resilience Social Support  Sleep:      Musculoskeletal: Strength & Muscle Tone: within normal limits Gait & Station: normal Patient leans: N/A  Treatment Plan Summary: Discharge home with follow-up with his regular provider, Rx given.  Nanine Means, PMH-NP 07/09/2014 1:17 PM   Patient seen, evaluated and I agree with notes by Nurse Practitioner. Thedore Mins, MD

## 2014-07-09 NOTE — ED Notes (Signed)
Pt refuses vitals  

## 2014-07-09 NOTE — ED Notes (Signed)
Medications given to [facility] NP, w/ Psych team.  She is personally returning medications to Pt.

## 2014-07-09 NOTE — [facility] Suicide Risk Assessment (Signed)
Suicide Risk Assessment  Discharge Assessment     Demographic Factors:  Male and NA  Total Time spent with patient: 20 minutes  Psychiatric Specialty Exam:     Blood pressure 111/63, pulse 99, temperature 98.3 F (36.8 C), temperature source Oral, resp. rate 18, SpO2 97.00%.There is no weight on file to calculate BMI.  General Appearance: Casual  Eye Contact::  Fair  Speech:  Normal   Volume:  Normal   Mood:  Irritable at times  Affect:  Congruent  Thought Process:  Clear and coherent  Orientation:  Full (Time, Place, and Person)  Thought Content:  Coherent  Suicidal Thoughts:  No  Homicidal Thoughts:  No  Memory:  Fair  Judgement:  Fair  Insight:  Lacking  Psychomotor Activity:  Normal  Concentration:  Fair  Recall:  [employer] of Knowledge:Fair  Language: Fair  Akathisia:  No  Handed:  Right  AIMS (if indicated):     Assets:  Health and safety inspector Housing Leisure Time Resilience Social Support  Sleep:      Musculoskeletal: Strength & Muscle Tone: within normal limits Gait & Station: normal Patient leans: N/A   Mental Status Per Nursing Assessment::   On Admission:     Current Mental Status by Physician: NA  Loss Factors: NA  Historical Factors: NA  Risk Reduction Factors:   Sense of responsibility to family, Living with another person, especially a relative, Positive social support and Positive therapeutic relationship  Continued Clinical Symptoms:  Irritability at times  Cognitive Features That Contribute To Risk:  None  Suicide Risk:  Minimal: No identifiable suicidal ideation.  Patients presenting with no risk factors but with morbid ruminations; may be classified as minimal risk based on the severity of the depressive symptoms  Discharge Diagnoses:   AXIS I:  TBI AXIS II:  Deferred AXIS III:   Past Medical History  Diagnosis Date  . Depression     Pt was planning to start taking abilify 10/20  . Traumatic brain injury    . Seizures   . Bipolar 1 disorder    AXIS IV:  educational problems, occupational problems, other psychosocial or environmental problems and problems related to social environment AXIS V:  61-70 mild symptoms  Plan Of Care/Follow-up recommendations:  Activity:  Activity as tolerated Diet:  Low Sodium, Heart Healthy Diet  Is patient on multiple antipsychotic therapies at discharge:  No   Has Patient had three or more failed trials of antipsychotic monotherapy by history:  No  Recommended Plan for Multiple Antipsychotic Therapies: NA    LORD, JAMISON PMH-NP 07/09/2014, 1:21 PM

## 2014-07-09 NOTE — Clinical Social Work Note (Signed)
CSW has updated patient's mother of plans for dc home today. She is requesting EMS transport as she does not drive and has no one to pick him up. Will advise RN of this- Patient is to resume all RX's as well as take the Serequel 4 times daily and follow up with his primary community care team/pshysician. Mom is agreeable to this plan.   Reece Levy, MSW, [place] [facility]ors [phone]

## 2014-07-09 NOTE — ED Notes (Signed)
Pt's linen changed due to incontinence as well as scrubs.

## 2014-07-09 NOTE — Clinical Social Work Note (Signed)
CSW advised by Psychiatry team that patient was stable for d/c home with mom- I have advised patient and his mother- they are agreeable to this plan for d/c home. Patient's mother indicates she does not drive and will need to arrange for someone to pick up any new/changed RX's- will await final d/c orders to communicate this to mother.  CSW attempted to educate and remind patient's mother of the importance of patient taking his meds as scheduled/ordered.  Mother reports that he has a brain injury from an accident- she also asks that we make sure to send him home with all  His medications which, per her report, came with him to the ED.  At this time, we are awaiting final d/c order (depakote level to be checked prior to d/c).  Reece Levy, MSW, [place] [phone]

## 2014-07-09 NOTE — ED Notes (Signed)
Pt refused vital signs.

## 2014-07-09 NOTE — Progress Notes (Signed)
CSW received a call from the patient's mother concerned about the times to administer his medications and the whereabouts of her home medicines that was brought in on admission.  CSW spoke with Catha Nottingham, NP who typed up a diagram of the times to give his medications and printed it for the patient's mother.  The patient's medications are still in the pharmacy and will be dropped off to the mother by Jamision, NP therefore she will be able to give the mother clarity on the administer of medications.    Maryelizabeth Rowan, MSW, [place], 07/09/2014 Evening Clinical Social Worker [phone]

## 2014-07-17 ENCOUNTER — Emergency Department (HOSPITAL_COMMUNITY)
Admission: EM | Admit: 2014-07-17 | Discharge: 2014-07-18 | Disposition: A | Discharge status: Home / Self Care | Payer: Medicaid Other | Attending: Emergency Medicine | Admitting: Emergency Medicine

## 2014-07-17 ENCOUNTER — Encounter (HOSPITAL_COMMUNITY): Payer: Self-pay | Admitting: Emergency Medicine

## 2014-07-17 DIAGNOSIS — F319 Bipolar disorder, unspecified: Secondary | ICD-10-CM | POA: Diagnosis not present

## 2014-07-17 DIAGNOSIS — R451 Restlessness and agitation: Secondary | ICD-10-CM

## 2014-07-17 DIAGNOSIS — Z046 Encounter for general psychiatric examination, requested by authority: Secondary | ICD-10-CM | POA: Diagnosis present

## 2014-07-17 DIAGNOSIS — Z87891 Personal history of nicotine dependence: Secondary | ICD-10-CM | POA: Insufficient documentation

## 2014-07-17 DIAGNOSIS — R4689 Other symptoms and signs involving appearance and behavior: Secondary | ICD-10-CM | POA: Diagnosis present

## 2014-07-17 DIAGNOSIS — Z87820 Personal history of traumatic brain injury: Secondary | ICD-10-CM | POA: Insufficient documentation

## 2014-07-17 DIAGNOSIS — IMO0002 Reserved for concepts with insufficient information to code with codable children: Secondary | ICD-10-CM | POA: Diagnosis not present

## 2014-07-17 DIAGNOSIS — Z79899 Other long term (current) drug therapy: Secondary | ICD-10-CM | POA: Diagnosis not present

## 2014-07-17 LAB — COMPREHENSIVE METABOLIC PANEL
ALT: 35 U/L (ref 0–53)
AST: 36 U/L (ref 0–37)
Albumin: 3.5 g/dL (ref 3.5–5.2)
Alkaline Phosphatase: 73 U/L (ref 39–117)
Anion gap: 12 (ref 5–15)
BUN: 11 mg/dL (ref 6–23)
CALCIUM: 8.6 mg/dL (ref 8.4–10.5)
CO2: 24 meq/L (ref 19–32)
CREATININE: 0.89 mg/dL (ref 0.50–1.35)
Chloride: 103 mEq/L (ref 96–112)
GFR calc Af Amer: >90 mL/min (ref >90)
GLUCOSE: 83 mg/dL (ref 70–99)
Potassium: 4.2 mEq/L (ref 3.7–5.3)
Sodium: 139 mEq/L (ref 137–147)
Total Bilirubin: 0.5 mg/dL (ref 0.3–1.2)
Total Protein: 7.7 g/dL (ref 6.0–8.3)

## 2014-07-17 LAB — CBC
HCT: 42 % (ref 39.0–52.0)
HEMOGLOBIN: 14.6 g/dL (ref 13.0–17.0)
MCH: 30.3 pg (ref 26.0–34.0)
MCHC: 34.8 g/dL (ref 30.0–36.0)
MCV: 87.1 fL (ref 78.0–100.0)
PLATELETS: 100 10*3/uL — AB (ref 150–400)
RBC: 4.82 MIL/uL (ref 4.22–5.81)
RDW: 15 % (ref 11.5–15.5)
WBC: 8.7 10*3/uL (ref 4.0–10.5)

## 2014-07-17 LAB — RAPID URINE DRUG SCREEN, HOSP PERFORMED
Amphetamines: NOT DETECTED
Barbiturates: NOT DETECTED
Benzodiazepines: NOT DETECTED
Cocaine: NOT DETECTED
Opiates: NOT DETECTED
Tetrahydrocannabinol: NOT DETECTED

## 2014-07-17 LAB — SALICYLATE LEVEL: Salicylate Lvl: <2 mg/dL — ABNORMAL LOW (ref 2.8–20.0)

## 2014-07-17 LAB — ETHANOL

## 2014-07-17 LAB — VALPROIC ACID LEVEL: Valproic Acid Lvl: 58.4 ug/mL (ref 50.0–100.0)

## 2014-07-17 LAB — ACETAMINOPHEN LEVEL: Acetaminophen (Tylenol), Serum: <15 ug/mL (ref 10–30)

## 2014-07-17 MED ORDER — ONDANSETRON HCL 4 MG PO TABS: 4.0000 mg | ORAL_TABLET | Freq: Three times a day (TID) | ORAL | Status: DC | PRN

## 2014-07-17 MED ORDER — ACETAMINOPHEN 325 MG PO TABS: 650.0000 mg | ORAL_TABLET | ORAL | Status: DC | PRN

## 2014-07-17 MED ORDER — ZIPRASIDONE MESYLATE 20 MG IM SOLR
10.0000 mg | Freq: Once | INTRAMUSCULAR | Status: AC
  Administered 2014-07-17: 10 mg via INTRAMUSCULAR
  Filled 2014-07-17: qty 20

## 2014-07-17 MED ORDER — LORAZEPAM 2 MG/ML IJ SOLN
1.0000 mg | Freq: Once | INTRAMUSCULAR | Status: AC
  Administered 2014-07-17: 1 mg via INTRAVENOUS
  Filled 2014-07-17: qty 1

## 2014-07-17 MED ORDER — ESCITALOPRAM OXALATE 10 MG PO TABS
10.0000 mg | ORAL_TABLET | Freq: Every day | ORAL | Status: DC
  Administered 2014-07-18: 10 mg via ORAL
  Filled 2014-07-17 (×2): qty 1

## 2014-07-17 MED ORDER — IBUPROFEN 200 MG PO TABS: 600.0000 mg | ORAL_TABLET | Freq: Three times a day (TID) | ORAL | Status: DC | PRN

## 2014-07-17 MED ORDER — ZOLPIDEM TARTRATE 5 MG PO TABS: 5.0000 mg | ORAL_TABLET | Freq: Every evening | ORAL | Status: DC | PRN

## 2014-07-17 MED ORDER — TRAZODONE HCL 100 MG PO TABS
100.0000 mg | ORAL_TABLET | Freq: Every day | ORAL | Status: DC
  Administered 2014-07-17: 100 mg via ORAL
  Filled 2014-07-17: qty 1

## 2014-07-17 MED ORDER — NICOTINE 21 MG/24HR TD PT24: 21.0000 mg | MEDICATED_PATCH | Freq: Every day | TRANSDERMAL | Status: DC

## 2014-07-17 MED ORDER — STERILE WATER FOR INJECTION IJ SOLN
INTRAMUSCULAR | Status: AC
  Administered 2014-07-17: 16:00:00
  Filled 2014-07-17: qty 10

## 2014-07-17 MED ORDER — ALUM & MAG HYDROXIDE-SIMETH 200-200-20 MG/5ML PO SUSP: 30.0000 mL | ORAL | Status: DC | PRN

## 2014-07-17 MED ORDER — HALOPERIDOL LACTATE 5 MG/ML IJ SOLN
10.0000 mg | Freq: Once | INTRAMUSCULAR | Status: AC
  Administered 2014-07-17: 10 mg via INTRAMUSCULAR
  Filled 2014-07-17: qty 2

## 2014-07-17 MED ORDER — DIVALPROEX SODIUM 500 MG PO DR TAB
500.0000 mg | DELAYED_RELEASE_TABLET | Freq: Two times a day (BID) | ORAL | Status: DC
  Administered 2014-07-17 – 2014-07-18 (×2): 500 mg via ORAL
  Filled 2014-07-17 (×2): qty 1

## 2014-07-17 MED ORDER — LORAZEPAM 1 MG PO TABS
1.0000 mg | ORAL_TABLET | Freq: Three times a day (TID) | ORAL | Status: DC | PRN
  Administered 2014-07-17 – 2014-07-18 (×3): 1 mg via ORAL
  Filled 2014-07-17 (×3): qty 1

## 2014-07-17 MED ORDER — QUETIAPINE FUMARATE 50 MG PO TABS
50.0000 mg | ORAL_TABLET | Freq: Four times a day (QID) | ORAL | Status: DC
  Administered 2014-07-17 – 2014-07-18 (×4): 50 mg via ORAL
  Filled 2014-07-17 (×4): qty 1

## 2014-07-17 NOTE — ED Notes (Signed)
Per IVC/[employer]-patient here for violent behavior-was at [facility], sent here for medical clearance-patient cursing, in handcuffs and face guard

## 2014-07-17 NOTE — ED Provider Notes (Signed)
CSN: [id number]     Arrival date & time 07/17/14  1247 History   First MD Initiated Contact with Patient 07/17/14 1322     Chief Complaint  Patient presents with  . IVC      (Consider location/radiation/quality/duration/timing/severity/associated sxs/prior Treatment) Patient is a 33 y.o. male presenting with mental health disorder.  Mental Health Problem Presenting symptoms: aggressive behavior   Patient accompanied by:  Law enforcement Degree of incapacity (severity):  Severe Onset quality:  Sudden Timing:  Constant Progression:  Unchanged Chronicity:  Recurrent Context: not recent medication change and not stressful life event   Relieved by:  Nothing Worsened by:  Nothing tried   Past Medical History  Diagnosis Date  . Depression     Pt was planning to start taking abilify 10/20  . Traumatic brain injury   . Seizures   . Bipolar 1 disorder    Past Surgical History  Procedure Laterality Date  . Fracture surgery  08/13/2013    L arm and L leg  . Orif humerus fracture Left 08/13/2013    Procedure: OPEN REDUCTION INTERNAL FIXATION (ORIF) HUMERAL SHAFT FRACTURE;  Surgeon: Harvie Junior, MD;  Location: [facility] OR;  Service: Orthopedics;  Laterality: Left;  . Orif tibia plateau Left 08/13/2013    Procedure: OPEN REDUCTION INTERNAL FIXATION (ORIF) TIBIAL PLATEAU;  Surgeon: Harvie Junior, MD;  Location: [facility] OR;  Service: Orthopedics;  Laterality: Left;  . Percutaneous tracheostomy  08/17/2013    Dr. Megan Mans  . Peg w/tracheostomy placement  08/17/2013    Dr. Megan Mans  . Percutaneous tracheostomy N/A 08/17/2013    Procedure: PERCUTANEOUS TRACHEOSTOMY;  Surgeon: Cherylynn Ridges, MD;  Location: [facility] OR;  Service: General;  Laterality: N/A;   No family history on file. History  Substance Use Topics  . Smoking status: Former Games developer  . Smokeless tobacco: Not on file  . Alcohol Use: No    Review of Systems  Unable to perform ROS: Psychiatric disorder      Allergies  Review of  patient's allergies indicates no known allergies.  Home Medications   Prior to Admission medications   Medication Sig Start Date End Date Taking? Authorizing Provider  divalproex (DEPAKOTE) 500 MG DR tablet Take 1 tablet (500 mg total) by mouth every 12 (twelve) hours. 07/09/14   Nanine Means, NP  escitalopram (LEXAPRO) 10 MG tablet Take 1 tablet (10 mg total) by mouth daily. 07/09/14   Nanine Means, NP  LORazepam (ATIVAN) 1 MG tablet Take 1 tablet (1 mg total) by mouth every 8 (eight) hours as needed for anxiety (agitation). 06/23/14   Nanine Means, NP  multivitamin-iron-minerals-folic acid (CENTRUM) chewable tablet Chew 1 tablet by mouth daily. 07/09/14   Nanine Means, NP  QUEtiapine (SEROQUEL) 50 MG tablet Take 1 tablet (50 mg total) by mouth 4 (four) times daily. 07/09/14   Nanine Means, NP  traZODone (DESYREL) 100 MG tablet Take 1 tablet (100 mg total) by mouth at bedtime. 07/09/14   Nanine Means, NP   BP 125/52  Pulse 123  Resp 18  SpO2 98% Physical Exam  Constitutional: He appears well-developed and well-nourished. No distress.  HENT:  Head: Normocephalic and atraumatic.  Mouth/Throat: Oropharynx is clear and moist. No oropharyngeal exudate.  Eyes: EOM are normal. Pupils are equal, round, and reactive to light.  Neck: Normal range of motion. Neck supple.  Cardiovascular: Normal rate and regular rhythm.  Exam reveals no friction rub.   No murmur heard. Pulmonary/Chest: Effort normal and breath  sounds normal. No respiratory distress. He has no wheezes. He has no rales.  Abdominal: Soft. He exhibits no distension. There is no tenderness. There is no rebound.  Musculoskeletal: Normal range of motion. He exhibits no edema.  Neurological: He is alert. No cranial nerve deficit. He exhibits normal muscle tone. Coordination normal.  Skin: No rash noted. He is not diaphoretic.  Psychiatric: His affect is angry. He is agitated.    ED Course  Procedures (including critical care time) Labs  Review Labs Reviewed  CBC  COMPREHENSIVE METABOLIC PANEL  ETHANOL  URINE RAPID DRUG SCREEN (HOSP PERFORMED)  SALICYLATE LEVEL  ACETAMINOPHEN LEVEL    Imaging Review No results found.   EKG Interpretation None      MDM   Final diagnoses:  Agitation    59M with hx of TBI presents with agitation. IVC and First Impression taken out by Psychiatrist for increased agitation and for throwing his shoes. When I walked into the room he said, "Fuck you. Fuck you, you dumbass doctor."  Will check labs. TTS consulted. Severely agitated, continuing to curse and try and throw shoes at [place]. Geodon given.    Elwin Mocha, MD 07/17/14 [phone]

## 2014-07-17 NOTE — ED Notes (Signed)
Haldol administered, will wait to draw blood until Haldol medication has opportunity to take effect. Pt is currently too combative for staff to successfully draw blood.

## 2014-07-17 NOTE — ED Notes (Signed)
Pt calm and cooperative at this time. Removed from restraints so that patient could eat and pt is now calm and able to follow commands. Alert, oriented per norm, and ambulatory.

## 2014-07-17 NOTE — ED Notes (Addendum)
Per Annice Pih, pt's home aide, states that family hopes he can stay here or at a facility for long enough to get his medications in his system so he will continue to take them at home.  Call Annice Pih at: [phone] for any questions or needs.

## 2014-07-17 NOTE — ED Notes (Signed)
Unable to obtain oral temperature due to patient's violent behavior.

## 2014-07-18 ENCOUNTER — Encounter (HOSPITAL_COMMUNITY): Payer: Self-pay | Admitting: Registered Nurse

## 2014-07-18 DIAGNOSIS — R4689 Other symptoms and signs involving appearance and behavior: Secondary | ICD-10-CM | POA: Diagnosis present

## 2014-07-18 NOTE — ED Notes (Signed)
Pt sleeping. 

## 2014-07-18 NOTE — ED Notes (Signed)
Pt BM on self. Pt given new brief; assisted with cleaning; new linens placed on bed.

## 2014-07-18 NOTE — ED Provider Notes (Signed)
Psych has evaluated patient and has rescinded the patient's IVC. I feel his progression is coming from his traumatic brain injury. At this time the patient is calm and stable to go home with family.  Audree Camel, MD 07/18/14 [phone]

## 2014-07-18 NOTE — ED Notes (Signed)
Pt agitated with staff. Pt pacing and walking down hall. Pt cursing.

## 2014-07-18 NOTE — ED Notes (Signed)
Pt ambulated to restroom to void. Pt calm. Pt denies needs at present time.

## 2014-07-18 NOTE — ED Notes (Signed)
Pt appears not as agitated; not as loud when speaking to staff. Pt constantly seen walking from chair/bed to door of room. Pt denies needs.

## 2014-07-18 NOTE — ED Notes (Addendum)
Pt given ice water and sandwich. Pt given belongings and dressing at present time. Pt calm and eating.

## 2014-07-18 NOTE — Progress Notes (Signed)
CSW spoke with the patient's mother his primary support and provided with resources.  CSW will completed an APS report with Ileana Roup and someone with follow up on tomorrow.  CSW informed the mother that this APS report would be completed and she agreed.  CSW informed that mother that a referral for ACTT team with [facility] would be completed and she was given contact information for [facility] [place].     Maryelizabeth Rowan, MSW, [place], 07/18/2014 Evening Clinical Social Worker [phone]

## 2014-07-18 NOTE — ED Notes (Addendum)
No vital signs obtained due to pt sleeping. Pt was agitated earlier RN told staff to let pt sleep. Pt has adequatel rise and fall of chest. Pt resp 14.

## 2014-07-18 NOTE — ED Notes (Addendum)
Pt states "come here so I can punch you." Pt has fists swinging toward staff. Pt assisted to clean self by NT related to stool on bottom. Pt brief applied.

## 2014-07-18 NOTE — ED Notes (Addendum)
Waiting for mother to discharge pt per Child psychotherapist.

## 2014-07-18 NOTE — ED Notes (Signed)
Social worker being contacted to assist pt with discharge. Per TTS will update staff.

## 2014-07-18 NOTE — ED Notes (Signed)
Pt calm; laying in bed. Pt cooperative with staff; takes medication without difficulty.

## 2014-07-18 NOTE — ED Notes (Signed)
Pt sagging pants as walking to door; pt steps on pants resulting in unsteady gait; no fall. Pt assisted to pull pants up and tighten. Pt assisted into bed. Pt currently resting.

## 2014-07-18 NOTE — ED Notes (Signed)
Pt ambulatory to restroom. Mother at bedside at present time.

## 2014-07-18 NOTE — Discharge Instructions (Signed)
Aggression °Physically aggressive behavior is common among small children. When frustrated or angry, toddlers may act out. Often, they will push, bite, or hit. Most children show less physical aggression as they grow up. Their language and interpersonal skills improve, too. But continued aggressive behavior is a sign of a problem. This behavior can lead to aggression and delinquency in adolescence and adulthood. °Aggressive behavior can be psychological or physical. Forms of psychological aggression include threatening or bullying others. Forms of physical aggression include:  °· Pushing. °· Hitting. °· Slapping. °· Kicking. °· Stabbing. °· Shooting. °· Raping.  °PREVENTION  °Encouraging the following behaviors can help manage aggression: °· Respecting others and valuing differences. °· Participating in school and community functions, including sports, music, after-school programs, community groups, and volunteer work. °· Talking with an adult when they are sad, depressed, fearful, anxious, or angry. Discussions with a parent or other family member, counselor, teacher, or coach can help. °· Avoiding alcohol and drug use. °· Dealing with disagreements without aggression, such as conflict resolution. To learn this, children need parents and caregivers to model respectful communication and problem solving. °· Limiting exposure to aggression and violence, such as video games that are not age appropriate, violence in the media, or domestic violence. °Document Released: 08/09/2007 Document Revised: 01/04/2012 Document Reviewed: 12/18/2010 °ExitCare® Patient Information ©2015 [employer]. This information is not intended to replace advice given to you by your health care provider. Make sure you discuss any questions you have with your health care provider. ° °

## 2014-07-18 NOTE — Consult Note (Signed)
[place] [facility] Consult   Reason for Consult:  Aggressive Behavior Referring Physician:  EDP  Cory [name] is an 33 y.o. male. Total Time spent with patient: 30 minutes  Assessment: AXIS I:  Tramatic Brain Injury AXIS II:  Deferred AXIS III:   Past Medical History  Diagnosis Date  . Depression     Pt was planning to start taking abilify 10/20  . Traumatic brain injury   . Seizures   . Bipolar 1 disorder    AXIS IV:  other psychosocial or environmental problems AXIS V:  61-70 mild symptoms  Plan:  No evidence of imminent risk to self or others at present.   Patient does not meet criteria for psychiatric inpatient admission.  Subjective:   Cory [name] is a 33 y.o. male patient.  HPI:  Patient present to [facility] via IVC by mother/family aggressive behavior. Patient states that he doesn't know why he is here and that he wants to go home.  The brother of patient states that patient has some mental disabilities prior to the TBI.  Patient has been resting and complaint.  Patient denies suicidal/homicidal ideation, audio/visual hallucinations, and paranoia.    Patient is not present with any psychiatric issue.  Patient has a history of TBI and prior mental disability.  Consulted with patient brother and states that they would like information for group home placement.   HPI Elements:   Location:  agressive behavior. Quality:  cussing. Severity:  agression. Timing:  1 day.  Past Psychiatric History: Past Medical History  Diagnosis Date  . Depression     Pt was planning to start taking abilify 10/20  . Traumatic brain injury   . Seizures   . Bipolar 1 disorder     reports that he has quit smoking. He does not have any smokeless tobacco history on file. He reports that he does not drink alcohol or use illicit drugs. No family history on file.         Allergies:  No Known Allergies  ACT Assessment Complete:  Yes:    Educational Status    Risk to Self: Risk to  self with the past 6 months Is patient at risk for suicide?: No, but patient needs Medical Clearance Substance abuse history and/or treatment for substance abuse?: No  Risk to Others:    Abuse:    Prior Inpatient Therapy:    Prior Outpatient Therapy:    Additional Information:                    Objective: Blood pressure 99/83, pulse 96, temperature 98.4 F (36.9 C), temperature source Oral, resp. rate 18, SpO2 99.00%.There is no weight on file to calculate BMI. Results for orders placed during the hospital encounter of 07/17/14 (from the past 72 hour(s))  CBC     Status: Abnormal   Collection Time    07/17/14  2:28 PM      Result Value Ref Range   WBC 8.7  4.0 - 10.5 K/uL   RBC 4.82  4.22 - 5.81 MIL/uL   Hemoglobin 14.6  13.0 - 17.0 g/dL   HCT 42.0  39.0 - 52.0 %   MCV 87.1  78.0 - 100.0 fL   MCH 30.3  26.0 - 34.0 pg   MCHC 34.8  30.0 - 36.0 g/dL   RDW 15.0  11.5 - 15.5 %   Platelets 100 (*) 150 - 400 K/uL   Comment: REPEATED TO VERIFY     SPECIMEN  CHECKED FOR CLOTS     PLATELET COUNT CONFIRMED BY SMEAR  COMPREHENSIVE METABOLIC PANEL     Status: None   Collection Time    07/17/14  2:28 PM      Result Value Ref Range   Sodium 139  137 - 147 mEq/L   Potassium 4.2  3.7 - 5.3 mEq/L   Chloride 103  96 - 112 mEq/L   CO2 24  19 - 32 mEq/L   Glucose, Bld 83  70 - 99 mg/dL   BUN 11  6 - 23 mg/dL   Creatinine, Ser 0.89  0.50 - 1.35 mg/dL   Calcium 8.6  8.4 - 10.5 mg/dL   Total Protein 7.7  6.0 - 8.3 g/dL   Albumin 3.5  3.5 - 5.2 g/dL   AST 36  0 - 37 U/L   ALT 35  0 - 53 U/L   Alkaline Phosphatase 73  39 - 117 U/L   Total Bilirubin 0.5  0.3 - 1.2 mg/dL   GFR calc non Af Amer >90  >90 mL/min   GFR calc Af Amer >90  >90 mL/min   Comment: (NOTE)     The eGFR has been calculated using the CKD EPI equation.     This calculation has not been validated in all clinical situations.     eGFR's persistently <90 mL/min signify possible Chronic Kidney     Disease.    Anion gap 12  5 - 15  ETHANOL     Status: None   Collection Time    07/17/14  2:28 PM      Result Value Ref Range   Alcohol, Ethyl (B) <11  0 - 11 mg/dL   Comment:            LOWEST DETECTABLE LIMIT FOR     SERUM ALCOHOL IS 11 mg/dL     FOR MEDICAL PURPOSES ONLY  SALICYLATE LEVEL     Status: Abnormal   Collection Time    07/17/14  2:28 PM      Result Value Ref Range   Salicylate Lvl <2.4 (*) 2.8 - 20.0 mg/dL  ACETAMINOPHEN LEVEL     Status: None   Collection Time    07/17/14  2:28 PM      Result Value Ref Range   Acetaminophen (Tylenol), Serum <15.0  10 - 30 ug/mL   Comment:            THERAPEUTIC CONCENTRATIONS VARY     SIGNIFICANTLY. A RANGE OF 10-30     ug/mL MAY BE AN EFFECTIVE     CONCENTRATION FOR MANY PATIENTS.     HOWEVER, SOME ARE BEST TREATED     AT CONCENTRATIONS OUTSIDE THIS     RANGE.     ACETAMINOPHEN CONCENTRATIONS     >150 ug/mL AT 4 HOURS AFTER     INGESTION AND >50 ug/mL AT 12     HOURS AFTER INGESTION ARE     OFTEN ASSOCIATED WITH TOXIC     REACTIONS.  URINE RAPID DRUG SCREEN (HOSP PERFORMED)     Status: None   Collection Time    07/17/14  3:06 PM      Result Value Ref Range   Opiates NONE DETECTED  NONE DETECTED   Cocaine NONE DETECTED  NONE DETECTED   Benzodiazepines NONE DETECTED  NONE DETECTED   Amphetamines NONE DETECTED  NONE DETECTED   Tetrahydrocannabinol NONE DETECTED  NONE DETECTED   Barbiturates NONE DETECTED  NONE DETECTED  Comment:            DRUG SCREEN FOR MEDICAL PURPOSES     ONLY.  IF CONFIRMATION IS NEEDED     FOR ANY PURPOSE, NOTIFY LAB     WITHIN 5 DAYS.                LOWEST DETECTABLE LIMITS     FOR URINE DRUG SCREEN     Drug Class       Cutoff (ng/mL)     Amphetamine      1000     Barbiturate      200     Benzodiazepine   017     Tricyclics       793     Opiates          300     Cocaine          300     THC              50  VALPROIC ACID LEVEL     Status: None   Collection Time    07/17/14  3:11 PM      Result  Value Ref Range   Valproic Acid Lvl 58.4  50.0 - 100.0 ug/mL   Comment: Performed at [facility] are reviewed see above values.  Medications reviewed and no changes made  Current Facility-Administered Medications  Medication Dose Route Frequency Provider Last Rate Last Dose  . acetaminophen (TYLENOL) tablet 650 mg  650 mg Oral Q4H PRN Evelina Bucy, MD      . alum & mag hydroxide-simeth (MAALOX/MYLANTA) 200-200-20 MG/5ML suspension 30 mL  30 mL Oral PRN Evelina Bucy, MD      . divalproex (DEPAKOTE) DR tablet 500 mg  500 mg Oral Q12H Evelina Bucy, MD   500 mg at 07/18/14 0913  . escitalopram (LEXAPRO) tablet 10 mg  10 mg Oral Daily Evelina Bucy, MD   10 mg at 07/18/14 0913  . ibuprofen (ADVIL,MOTRIN) tablet 600 mg  600 mg Oral Q8H PRN Evelina Bucy, MD      . LORazepam (ATIVAN) tablet 1 mg  1 mg Oral Q8H PRN Evelina Bucy, MD   1 mg at 07/18/14 0746  . nicotine (NICODERM CQ - dosed in mg/24 hours) patch 21 mg  21 mg Transdermal Daily Evelina Bucy, MD      . ondansetron [facility]) tablet 4 mg  4 mg Oral Q8H PRN Evelina Bucy, MD      . QUEtiapine (SEROQUEL) tablet 50 mg  50 mg Oral QID Evelina Bucy, MD   50 mg at 07/18/14 0913  . traZODone (DESYREL) tablet 100 mg  100 mg Oral QHS Evelina Bucy, MD   100 mg at 07/17/14 2154  . zolpidem (AMBIEN) tablet 5 mg  5 mg Oral QHS PRN Evelina Bucy, MD       Current Outpatient Prescriptions  Medication Sig Dispense Refill  . divalproex (DEPAKOTE) 500 MG DR tablet Take 1 tablet (500 mg total) by mouth every 12 (twelve) hours.  60 tablet  0  . escitalopram (LEXAPRO) 10 MG tablet Take 1 tablet (10 mg total) by mouth daily.  30 tablet  3  . LORazepam (ATIVAN) 1 MG tablet Take 1 tablet (1 mg total) by mouth every 8 (eight) hours as needed for anxiety (agitation).  30 tablet  0  . multivitamin-iron-minerals-folic acid (CENTRUM) chewable tablet Chew 1 tablet by mouth daily.      . QUEtiapine (SEROQUEL)  50 MG tablet Take 1 tablet (50 mg total) by mouth 4  (four) times daily.  150 tablet  0  . traZODone (DESYREL) 100 MG tablet Take 1 tablet (100 mg total) by mouth at bedtime.        Psychiatric Specialty Exam:     Blood pressure 99/83, pulse 96, temperature 98.4 F (36.9 C), temperature source Oral, resp. rate 18, SpO2 99.00%.There is no weight on file to calculate BMI.  General Appearance: Casual and Disheveled  Eye Contact::  Minimal  Speech:  Slurred  Volume:  Decreased  Mood:  Irritable  Affect:  Congruent  Thought Process:  Circumstantial  Orientation:  Full (Time, Place, and Person)  Thought Content:  "I want to go home"  Suicidal Thoughts:  No  Homicidal Thoughts:  No  Memory:  Immediate;   Poor Recent;   Poor Remote;   Poor  Judgement:  Impaired  Insight:  Lacking  Psychomotor Activity:  Decreased  Concentration:  Poor  Recall:  [place] of Knowledge:Poor  Language: Poor  Akathisia:  No  Handed:  Right  AIMS (if indicated):     Assets:  Desire for Improvement Social Support  Sleep:      Musculoskeletal: Strength & Muscle Tone: within normal limits Gait & Station: Patient in bed did not see ambulate Patient leans: N/A  Treatment Plan Summary:  Patient has been cleared psychiatrically.  Patient can be discharged once cleared medically. Social Work will need to work with patient related to going home or group home placement.  Earleen Newport, FNP-BC 07/18/2014 3:07 PM

## 2014-07-18 NOTE — ED Notes (Signed)
TTS at bedside. 

## 2014-07-18 NOTE — ED Notes (Signed)
Patient home medications left at discharge. Patient mother notified via telephone that medications will be at registration and someone will need to bring an ID when they pick them up.

## 2014-07-19 NOTE — Consult Note (Signed)
Face to face evaluation and I agree with this note 

## 2014-07-20 NOTE — Progress Notes (Signed)
07/20/14  CSW received call from [facility] ACT team clinical director, Elvina Sidle. Burns stated she had received our referral from 9/23 and was had called pt's mother this morning to try to set up an interview. Per Lawerance Bach, mother was "suspcious" of ACT and stated that she did not want to ACT to call her house again--she would call ACT services if she became interested. This contradicts what CSW Olivia Mackie wrote on 9/23, when pt's mother stated she was interested in ACT services. Burns stated [facility] was still interested in working with pt, but would respect mothers wishes. Burns stated they would hang on to the referral, and she requested that ED keep in contact with team if anything changes.  CSW attempted to call pt's mother and provide psychoeducation and information on ACT services. Left message.  CSW called Shelly Coss, to request a care coordinator as pt has been in ED three times in past month. Put forth request with clinician [facility].   Mariann Laster,     ED CSW  phone: [phone]

## 2014-07-22 ENCOUNTER — Emergency Department (HOSPITAL_COMMUNITY)
Admission: EM | Admit: 2014-07-22 | Discharge: 2014-07-27 | Disposition: A | Discharge status: Home / Self Care | Payer: Medicaid Other | Attending: Emergency Medicine | Admitting: Emergency Medicine

## 2014-07-22 DIAGNOSIS — Z87820 Personal history of traumatic brain injury: Secondary | ICD-10-CM | POA: Insufficient documentation

## 2014-07-22 DIAGNOSIS — T43596A Underdosing of other antipsychotics and neuroleptics, initial encounter: Secondary | ICD-10-CM | POA: Insufficient documentation

## 2014-07-22 DIAGNOSIS — Z87891 Personal history of nicotine dependence: Secondary | ICD-10-CM | POA: Diagnosis not present

## 2014-07-22 DIAGNOSIS — F319 Bipolar disorder, unspecified: Secondary | ICD-10-CM | POA: Diagnosis not present

## 2014-07-22 DIAGNOSIS — G40909 Epilepsy, unspecified, not intractable, without status epilepticus: Secondary | ICD-10-CM | POA: Diagnosis not present

## 2014-07-22 DIAGNOSIS — S42412K Displaced simple supracondylar fracture without intercondylar fracture of left humerus, subsequent encounter for fracture with nonunion: Secondary | ICD-10-CM

## 2014-07-22 DIAGNOSIS — Z008 Encounter for other general examination: Secondary | ICD-10-CM | POA: Diagnosis present

## 2014-07-22 DIAGNOSIS — IMO0002 Reserved for concepts with insufficient information to code with codable children: Secondary | ICD-10-CM | POA: Diagnosis not present

## 2014-07-22 DIAGNOSIS — S069XAA Unspecified intracranial injury with loss of consciousness status unknown, initial encounter: Secondary | ICD-10-CM | POA: Diagnosis present

## 2014-07-22 DIAGNOSIS — R569 Unspecified convulsions: Secondary | ICD-10-CM | POA: Insufficient documentation

## 2014-07-22 DIAGNOSIS — S069X9A Unspecified intracranial injury with loss of consciousness of unspecified duration, initial encounter: Secondary | ICD-10-CM | POA: Diagnosis present

## 2014-07-22 DIAGNOSIS — Z79899 Other long term (current) drug therapy: Secondary | ICD-10-CM | POA: Diagnosis not present

## 2014-07-22 DIAGNOSIS — R4689 Other symptoms and signs involving appearance and behavior: Secondary | ICD-10-CM | POA: Diagnosis present

## 2014-07-22 DIAGNOSIS — R4189 Other symptoms and signs involving cognitive functions and awareness: Secondary | ICD-10-CM | POA: Diagnosis present

## 2014-07-22 DIAGNOSIS — R456 Violent behavior: Secondary | ICD-10-CM | POA: Insufficient documentation

## 2014-07-22 DIAGNOSIS — Z91128 Patient's intentional underdosing of medication regimen for other reason: Secondary | ICD-10-CM | POA: Insufficient documentation

## 2014-07-22 DIAGNOSIS — F911 Conduct disorder, childhood-onset type: Secondary | ICD-10-CM | POA: Diagnosis not present

## 2014-07-22 LAB — CBC WITH DIFFERENTIAL/PLATELET
BASOS ABS: 0.1 10*3/uL (ref 0.0–0.1)
Basophils Relative: 1 % (ref 0–1)
EOS ABS: 0.3 10*3/uL (ref 0.0–0.7)
Eosinophils Relative: 3 % (ref 0–5)
HEMATOCRIT: 41.1 % (ref 39.0–52.0)
Hemoglobin: 14.5 g/dL (ref 13.0–17.0)
LYMPHS ABS: 2.4 10*3/uL (ref 0.7–4.0)
Lymphocytes Relative: 26 % (ref 12–46)
MCH: 30.4 pg (ref 26.0–34.0)
MCHC: 35.3 g/dL (ref 30.0–36.0)
MCV: 86.2 fL (ref 78.0–100.0)
MONO ABS: 0.8 10*3/uL (ref 0.1–1.0)
Monocytes Relative: 9 % (ref 3–12)
NEUTROS ABS: 5.5 10*3/uL (ref 1.7–7.7)
Neutrophils Relative %: 61 % (ref 43–77)
Platelets: 112 10*3/uL — ABNORMAL LOW (ref 150–400)
RBC: 4.77 MIL/uL (ref 4.22–5.81)
RDW: 15 % (ref 11.5–15.5)
WBC: 9.1 10*3/uL (ref 4.0–10.5)

## 2014-07-22 LAB — COMPREHENSIVE METABOLIC PANEL
ALBUMIN: 3.5 g/dL (ref 3.5–5.2)
ALT: 30 U/L (ref 0–53)
ANION GAP: 14 (ref 5–15)
AST: 32 U/L (ref 0–37)
Alkaline Phosphatase: 80 U/L (ref 39–117)
BUN: 12 mg/dL (ref 6–23)
CALCIUM: 9.1 mg/dL (ref 8.4–10.5)
CO2: 27 mEq/L (ref 19–32)
CREATININE: 1.05 mg/dL (ref 0.50–1.35)
Chloride: 98 mEq/L (ref 96–112)
GFR calc Af Amer: >90 mL/min (ref >90)
GFR calc non Af Amer: >90 mL/min (ref >90)
Glucose, Bld: 89 mg/dL (ref 70–99)
Potassium: 4.2 mEq/L (ref 3.7–5.3)
Sodium: 139 mEq/L (ref 137–147)
TOTAL PROTEIN: 7.9 g/dL (ref 6.0–8.3)
Total Bilirubin: 0.4 mg/dL (ref 0.3–1.2)

## 2014-07-22 LAB — VALPROIC ACID LEVEL: VALPROIC ACID LVL: 60.6 ug/mL (ref 50.0–100.0)

## 2014-07-22 LAB — RAPID URINE DRUG SCREEN, HOSP PERFORMED
AMPHETAMINES: NOT DETECTED
Barbiturates: NOT DETECTED
Benzodiazepines: NOT DETECTED
Cocaine: NOT DETECTED
OPIATES: NOT DETECTED
TETRAHYDROCANNABINOL: NOT DETECTED

## 2014-07-22 LAB — ETHANOL

## 2014-07-22 LAB — SALICYLATE LEVEL: Salicylate Lvl: <2 mg/dL — ABNORMAL LOW (ref 2.8–20.0)

## 2014-07-22 LAB — ACETAMINOPHEN LEVEL: Acetaminophen (Tylenol), Serum: <15 ug/mL (ref 10–30)

## 2014-07-22 MED ORDER — ADULT MULTIVITAMIN W/MINERALS CH
1.0000 | ORAL_TABLET | Freq: Every day | ORAL | Status: DC
  Administered 2014-07-22 – 2014-07-27 (×6): 1 via ORAL
  Filled 2014-07-22 (×6): qty 1

## 2014-07-22 MED ORDER — IBUPROFEN 200 MG PO TABS: 600.0000 mg | ORAL_TABLET | Freq: Three times a day (TID) | ORAL | Status: DC | PRN

## 2014-07-22 MED ORDER — DIPHENHYDRAMINE HCL 50 MG/ML IJ SOLN
50.0000 mg | Freq: Once | INTRAMUSCULAR | Status: AC
  Administered 2014-07-22: 50 mg via INTRAMUSCULAR
  Filled 2014-07-22: qty 1

## 2014-07-22 MED ORDER — ESCITALOPRAM OXALATE 10 MG PO TABS
10.0000 mg | ORAL_TABLET | Freq: Every day | ORAL | Status: DC
  Administered 2014-07-22 – 2014-07-27 (×6): 10 mg via ORAL
  Filled 2014-07-22 (×6): qty 1

## 2014-07-22 MED ORDER — NICOTINE 21 MG/24HR TD PT24
21.0000 mg | MEDICATED_PATCH | Freq: Once | TRANSDERMAL | Status: AC
  Administered 2014-07-22 – 2014-07-23 (×2): 21 mg via TRANSDERMAL
  Filled 2014-07-22: qty 1

## 2014-07-22 MED ORDER — LORAZEPAM 2 MG/ML IJ SOLN
2.0000 mg | Freq: Once | INTRAMUSCULAR | Status: AC
  Administered 2014-07-22: 2 mg via INTRAMUSCULAR
  Filled 2014-07-22: qty 1

## 2014-07-22 MED ORDER — LORAZEPAM 1 MG PO TABS
1.0000 mg | ORAL_TABLET | Freq: Four times a day (QID) | ORAL | Status: DC | PRN
  Administered 2014-07-22 – 2014-07-27 (×10): 1 mg via ORAL
  Filled 2014-07-22 (×11): qty 1

## 2014-07-22 MED ORDER — TRAZODONE HCL 100 MG PO TABS
100.0000 mg | ORAL_TABLET | Freq: Every day | ORAL | Status: DC
  Administered 2014-07-22 – 2014-07-26 (×5): 100 mg via ORAL
  Filled 2014-07-22 (×5): qty 1

## 2014-07-22 MED ORDER — DIVALPROEX SODIUM 500 MG PO DR TAB
500.0000 mg | DELAYED_RELEASE_TABLET | Freq: Two times a day (BID) | ORAL | Status: DC
  Administered 2014-07-22 – 2014-07-27 (×10): 500 mg via ORAL
  Filled 2014-07-22 (×10): qty 1

## 2014-07-22 MED ORDER — ACETAMINOPHEN 325 MG PO TABS: 650.0000 mg | ORAL_TABLET | ORAL | Status: DC | PRN

## 2014-07-22 MED ORDER — QUETIAPINE FUMARATE 50 MG PO TABS
50.0000 mg | ORAL_TABLET | Freq: Four times a day (QID) | ORAL | Status: DC
  Administered 2014-07-22 – 2014-07-27 (×18): 50 mg via ORAL
  Filled 2014-07-22 (×18): qty 1

## 2014-07-22 MED ORDER — HALOPERIDOL LACTATE 5 MG/ML IJ SOLN
5.0000 mg | Freq: Once | INTRAMUSCULAR | Status: AC
  Administered 2014-07-22: 5 mg via INTRAMUSCULAR
  Filled 2014-07-22: qty 1

## 2014-07-22 NOTE — ED Notes (Signed)
Pt becoming very anxious.  Threatening to leave.  Pt is redirectable but escalating.  MD notified for meds.

## 2014-07-22 NOTE — ED Notes (Signed)
Bed: ZO10 Expected date:  Expected time:  Means of arrival:  Comments: hold

## 2014-07-22 NOTE — ED Notes (Signed)
Phlebotomy at bedside attempting to obtain labs after pt gave consent
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
## 2014-07-22 NOTE — ED Notes (Signed)
Pt belongings x1 bag of soiled clothing placed into locker # 26

## 2014-07-22 NOTE — ED Notes (Signed)
Unable to assess most triage questions and assessment due to pt currently being combative, in handcuffs, monitored by [employer], and only able to state "fuck you".
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
## 2014-07-22 NOTE — Progress Notes (Addendum)
4:14pm. CSW placed call to pt's mother, Cory [name] [phone]). Phone went straight to voicemail. CSW left message.  5:44pm. CSW placed another call to pt's mother and left message.   Mariann Laster,     ED CSW  phone: [phone]
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
## 2014-07-22 NOTE — ED Notes (Signed)
Blood to be collected after pt is less agitated

## 2014-07-23 ENCOUNTER — Encounter (HOSPITAL_COMMUNITY): Payer: Self-pay | Admitting: Psychiatry

## 2014-07-23 MED ORDER — NICOTINE 21 MG/24HR TD PT24
21.0000 mg | MEDICATED_PATCH | Freq: Once | TRANSDERMAL | Status: AC
  Administered 2014-07-24: 21 mg via TRANSDERMAL
  Filled 2014-07-23 (×2): qty 1

## 2014-07-23 NOTE — Progress Notes (Signed)
CSW completed FL2 and sent out referrals for facilities options because of the patient's level of care needs.  At this time it do not appear the patient would benefit from group home level placement and unless this changes we will continue with the current course.    Maryelizabeth Rowan, MSW, [place], 07/23/2014 Evening Clinical Social Worker [phone]

## 2014-07-23 NOTE — Progress Notes (Signed)
Clinical Social Work  Per chart review, patient has been cleared to DC home. CSW called and left a message with mom but has not received a return call. CSW called and spoke with brother Apolinar Junes) who reports he is at work but will try and get in touch with mom and inform her to contact CSW.  Previous CSW had been working on getting patient a Gaffer and referral to [facility] for ACT services. CSW spoke with [facility] representative who reports CSW needs to speak with Rennie Natter re: services. CSW left a message with Corrie Dandy and awaiting return call. CSW also spoke with Herbert Seta at [facility] who reports she needs to speak with ACT lead re: referral.   CSW will continue to follow and assist with DC needs.  Unk Lightning, [place] (ED Coverage [phone])
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
## 2014-07-23 NOTE — Progress Notes (Signed)
CSW called [facility] and spoke with the supervisor Hanley Ben [phone] to follow up on the report filed last week.  He reports that the information that was collected was incomplete and more information is needed.  He redirected me to [facility] [phone] who completed an updated report. She reports that the case will be followed up on with in the next 24 hours because of the severity.     Maryelizabeth Rowan, MSW, [place], 07/23/2014 Evening Clinical Social Worker [phone]

## 2014-07-23 NOTE — ED Notes (Signed)
Tried to call patients mother multiple times, she has now turned her phone off. Did call non-emergency [employer] to go and check on mother; awaiting to hear back

## 2014-07-23 NOTE — [facility] Suicide Risk Assessment (Signed)
Suicide Risk Assessment  Discharge Assessment     Demographic Factors:  Male and Adolescent or young adult  Total Time spent with patient: 20 minutes    Psychiatric Specialty Exam:     Blood pressure 111/67, pulse 94, temperature 97.7 F (36.5 C), temperature source Oral, resp. rate 18, SpO2 100.00%.There is no weight on file to calculate BMI.  General Appearance: Casual  Eye Contact::  Good  Speech:  Normal Rate  Volume:  Normal  Mood:  Euthymic  Affect:  Blunt  Thought Process:  Coherent  Orientation:  Full (Time, Place, and Person)  Thought Content:  WDL  Suicidal Thoughts:  No  Homicidal Thoughts:  No  Memory:  Immediate;   Fair Recent;   Fair Remote;   Fair  Judgement:  Impaired  Insight:  Fair  Psychomotor Activity:  Normal  Concentration:  Fair  Recall:  [employer] of Knowledge:Fair  Language: Fair  Akathisia:  No  Handed:  Right  AIMS (if indicated):     Assets:  Health and safety inspector Housing Leisure Time Resilience Social Support  Sleep:      Musculoskeletal: Strength & Muscle Tone: within normal limits Gait & Station: normal  Mental Status Per Nursing Assessment::   On Admission:   Agitation  Current Mental Status by Physician: NA  Loss Factors: NA  Historical Factors: Impulsivity  Risk Reduction Factors:   Sense of responsibility to family, Living with another person, especially a relative, Positive social support and Positive therapeutic relationship  Continued Clinical Symptoms:  None  Cognitive Features That Contribute To Risk:  Limited due to TBI  Suicide Risk:  Minimal: No identifiable suicidal ideation.  Patients presenting with no risk factors but with morbid ruminations; may be classified as minimal risk based on the severity of the depressive symptoms  Discharge Diagnoses:   AXIS I:  TBI, aggression AXIS II:  Deferred AXIS III:   Past Medical History  Diagnosis Date  . Depression     Pt was planning to  start taking abilify 10/20  . Traumatic brain injury   . Seizures   . Bipolar 1 disorder    AXIS IV:  other psychosocial or environmental problems, problems related to social environment and problems with primary support group AXIS V:  61-70 mild symptoms  Plan Of Care/Follow-up recommendations:  Activity:  as tolerated Diet:  low-sodium heart healthy diet  Is patient on multiple antipsychotic therapies at discharge:  No   Has Patient had three or more failed trials of antipsychotic monotherapy by history:  No  Recommended Plan for Multiple Antipsychotic Therapies: NA    Cory [name], PMH-NP 07/23/2014, 2:08 PM

## 2014-07-24 ENCOUNTER — Encounter (HOSPITAL_COMMUNITY): Payer: Self-pay | Admitting: Registered Nurse

## 2014-07-24 DIAGNOSIS — Z87820 Personal history of traumatic brain injury: Secondary | ICD-10-CM

## 2014-07-24 DIAGNOSIS — R4189 Other symptoms and signs involving cognitive functions and awareness: Secondary | ICD-10-CM | POA: Diagnosis present

## 2014-07-24 DIAGNOSIS — F603 Borderline personality disorder: Secondary | ICD-10-CM

## 2014-07-24 MED ORDER — LORAZEPAM 2 MG/ML IJ SOLN
2.0000 mg | Freq: Once | INTRAMUSCULAR | Status: AC
  Administered 2014-07-24: 2 mg via INTRAMUSCULAR
  Filled 2014-07-24: qty 1

## 2014-07-24 MED ORDER — OLANZAPINE 10 MG PO TBDP
10.0000 mg | ORAL_TABLET | Freq: Every day | ORAL | Status: DC
  Administered 2014-07-24 – 2014-07-26 (×3): 10 mg via ORAL
  Filled 2014-07-24 (×3): qty 1
  Filled 2014-07-24: qty 2
  Filled 2014-07-24: qty 1

## 2014-07-24 MED ORDER — OLANZAPINE 10 MG PO TBDP
10.0000 mg | ORAL_TABLET | Freq: Once | ORAL | Status: AC
  Administered 2014-07-24: 10 mg via ORAL

## 2014-07-24 MED ORDER — ZIPRASIDONE MESYLATE 20 MG IM SOLR: 10.0000 mg | Freq: Once | INTRAMUSCULAR | Status: DC

## 2014-07-24 MED ORDER — ZIPRASIDONE MESYLATE 20 MG IM SOLR: 20.0000 mg | Freq: Once | INTRAMUSCULAR | Status: DC
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
## 2014-07-24 NOTE — Progress Notes (Signed)
Clinical Social Work  Call received from [facility] Services[facility]ather Replogle from [facility] who reports she will come evaluate patient in ED tomorrow around 10:30am-11am.  Unk LightningHolly Gunhild Bautch, LCSW (ED Coverage)

## 2014-07-24 NOTE — ED Notes (Signed)
TTS at bedside. 

## 2014-07-24 NOTE — Evaluation (Signed)
Physical Therapy Evaluation Patient Details Name: Cory [name] MRN: [id number] DOB: Nov 22, 1980 Today's Date: 07/24/2014   History of Present Illness  33 year admit to ED with agressive behavior and history of TBI 10 months ago.   Clinical Impression  Pt with deficits from previous TBI 10 months ago. Difficult to know his baseline and abilities or challenges at home. He does present with an unsteady gait and seems to compensate for this pattern. He has extensor pattern on R side and has step to pattern and shuffled and weaving at times, but corrects with cues. May benefit from PT follow at next level at care where ever that may be in home or SNF to see if he can improve in his ability and gait at this time in his impairments.     Follow Up Recommendations  (may benefit from HHPT if not SNF)    Equipment Recommendations  None recommended by PT    Recommendations for Other Services       Precautions / Restrictions        Mobility  Bed Mobility                  Transfers                 General transfer comment: pt more of supervision for transfers , moved in his chair while I was there and stood to pivot and repostion chair, just does so with little movment on RLE and RUE.  Ambulation/Gait Ambulation/Gait assistance: Supervision;Min guard Ambulation Distance (Feet): 80 Feet (2x) Assistive device: None       General Gait Details: gait pattern inconsistent. At time with his increase speed, his body seems to get ahead of his feet and with cues he slows down to regain his balance and corrects himself. He states he has not ever fallen, but unsure if this is accurate.   Stairs            Wheelchair Mobility    Modified Rankin (Stroke Patients Only)       Balance                                             Pertinent Vitals/Pain      Home Living Family/patient expects to be discharged to:: Unsure Living Arrangements: Parent               Additional Comments: had been at [facility] in the past .     Prior Function           Comments: pt unable to state and no family around     Hand Dominance        Extremity/Trunk Assessment               Lower Extremity Assessment: Difficult to assess due to impaired cognition;RLE deficits/detail;LLE deficits/detail RLE Deficits / Details: pt with noticable deficits on Right side of body (UE and LE) than LLE. Pt in extension pattern on R in LLE with no ability to isolate DF , but moderate ability to perfrom Knee extension .  LLE Deficits / Details: generalized weakness and decreased AROM fo DF (but greater difficulties on RLE than LLE)     Communication   Communication:  (would answer in short one word answers, but focused on asking if I had a cigarette and if he could go outside to  smoke. )  Cognition Arousal/Alertness: Awake/alert Behavior During Therapy: Flat affect (perseverating on items (smoking and wanting to go to his grandmothers house, or outside  or to smoke )) Overall Cognitive Status: No family/caregiver present to determine baseline cognitive functioning (Likely at baseline)                      General Comments      Exercises        Assessment/Plan    PT Assessment All further PT needs can be met in the next venue of care  PT Diagnosis Abnormality of gait   PT Problem List Decreased balance;Decreased mobility;Impaired tone  PT Treatment Interventions     PT Goals (Current goals can be found in the Care Plan section) Acute Rehab PT Goals Patient Stated Goal: " I want to go to my grandmothers" PT Goal Formulation: No goals set, d/c therapy (pt discharging to next place of care from ED. )    Frequency     Barriers to discharge        Co-evaluation               End of Session   Activity Tolerance: Patient tolerated treatment well (couldn't get pt to follow detailed commands to assess further balance abilities  due to pt really focused on "going to his grandmothers " or "going out to smoke") Patient left: in chair;with nursing/sitter in room (in hallway CNA present while pt sitting at desk. ) Nurse Communication: Mobility status    Functional Assessment Tool Used: clinical judgement Functional Limitation: Mobility: Walking and moving around Mobility: Walking and Moving Around Current Status (P0141): At least 1 percent but less than 20 percent impaired, limited or restricted Mobility: Walking and Moving Around Goal Status [phone]): At least 1 percent but less than 20 percent impaired, limited or restricted Mobility: Walking and Moving Around Discharge Status [phone]): At least 1 percent but less than 20 percent impaired, limited or restricted    Time: 1700-1720 PT Time Calculation (min): 20 min   Charges:   PT Evaluation $Initial PT Evaluation Tier I: 1 Procedure PT Treatments $Gait Training: 8-22 mins   PT G Codes:   Functional Assessment Tool Used: clinical judgement Functional Limitation: Mobility: Walking and moving around    [place], [facility] 07/24/2014, 6:13 PM Clide Dales, PT Pager: [phone] 07/24/2014

## 2014-07-24 NOTE — Progress Notes (Signed)
Clinical Social Work  CSW received handoff re: 2nd shift ED CSW making APS referral. APS to follow up with patient as well. Per chart review, mother is refusing to take patient home so CSW is assisting with disposition planning.  CSW spoke with Lynetta MareDiana Burns [phone]) from [facility] who reports they received the referral from a previous admission and attempted to enroll patient to their ACT services but mom did not want services. [facility] reports they are aware of hospitalization and feel patient is a good candidate and will work with engaging mom for services again.  CSW will continue to follow and await to hear from APS.  Unk LightningHolly Gunnar [place]reford, [place] (ED Coverage)

## 2014-07-24 NOTE — ED Notes (Signed)
Pt becoming agitated. Telling nurses to shut up, cursing at staff at nurses station from in  room. not allowing nurses to steady his gait.
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
## 2014-07-24 NOTE — ED Notes (Signed)
Pt sitting at nurses station with sitter. Consistently asking if staff will call his mother and grandmother. Sts he is going to live with his grandmother. Pt attempted to get up and move toward unit door reporting he was leaving to go to grandmother's. Pt easily redirected back to chair.

## 2014-07-24 NOTE — Consult Note (Signed)
Face to face evaluation and I agree with this note 

## 2014-07-24 NOTE — Progress Notes (Addendum)
CSW left a message for [facility] [phone]/1-800-Helping.  This agency suppose to have residential housing for the TBI population specifically. CSW will leave pass off for the covering social worker to communicate with the care coordinator to follow up with housing/placement assistance during the day.    CSW left a message with the the TBI state manger [phone], [phone].   Maryelizabeth Rowanressa Lala Been, MSW, [place], 07/24/2014 Evening Clinical Social Worker [phone]
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
## 2014-07-24 NOTE — ED Notes (Signed)
Per Dr. Ladona Ridgelaylor and Derwood KaplanShavon, pt has been cleared by psych. Any additional medication orders will need to be from EDP. Waiting to hear from SW regarding pt's placement after dispo. Will continue to monitor. Sitter and security at bedside.

## 2014-07-24 NOTE — BH Assessment (Signed)
Patient evaluated by Assunta FoundShuvon Rankin, NP and Dr. Ladona Ridgelaylor. Patient is psychiatrically cleared from further psych services. Psych providers recommend SW follow up. Writer contacted LCSW-Holly who is involved/aware of patient's disposition. LCSW will continue to follow up on patient's disposition.

## 2014-07-24 NOTE — ED Notes (Signed)
Bed: WA26 Expected date:  Expected time:  Means of arrival:  Comments: Rm 12 

## 2014-07-24 NOTE — ED Notes (Signed)
Bed: [id number] Expected date:  Expected time:  Means of arrival:  Comments: Hold for TCU

## 2014-07-24 NOTE — Progress Notes (Signed)
Clinical Social Work  CSW received a return call from Lynetta Mareiana Burns at [facility] who reports that a team member (most likely [place]) will come to the ED in order to evaluate patient. CSW received a return call from Rennie NatterMary Shelton at [facility] who reports CSW needs to speak with Griffin DakinAshley Lucas re: care coordinator. CSW left Morrie Sheldonshley [phone]) a message and will continue to follow.  Unk LightningHolly Jamina Macbeth, [place] (ED Coverage)

## 2014-07-25 MED ORDER — LORAZEPAM 2 MG/ML IJ SOLN
1.0000 mg | Freq: Once | INTRAMUSCULAR | Status: AC
  Administered 2014-07-25: 1 mg via INTRAMUSCULAR
  Filled 2014-07-25: qty 1
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
## 2014-07-25 NOTE — BH Assessment (Addendum)
Received call from Park MeoJanice Reeds from [facility] [place], [place] called regarding placement for this patient. Sts that she has a group home with 4 open beds. Facility is willing to consider patient but asking that clinicals, labs, and FL2 are faxed to [phone]. The fax # is also the contact #. Writer has left 2 voicemail's @ [phone] for LCSW to assist and follow up with referral. Pt to remain in the ED until placed in a appropriate facility.

## 2014-07-25 NOTE — Progress Notes (Signed)
Clinical Social Work  CSW spoke with Morrie SheldonAshley at [facility] who reports that Ford Motor CompanyJennifer Gustasson [phone]) will be assisting with placement. Morrie Sheldonshley will update Victorino DikeJennifer and Victorino DikeJennifer will contact CSW with further information.  Unk LightningHolly Katerra Ingman, [place]CS[place]  (ED Coverage)

## 2014-07-25 NOTE — ED Notes (Signed)
Spoke to [place], Child psychotherapistsocial worker, pts is awaiting bed placement; trying to apply for a TBI program

## 2014-07-25 NOTE — Progress Notes (Signed)
Clinical Social Work  CSW left a message with care coordinator at [facility](Ashley Lucas [phone]) in order to discuss disposition plans for patient. PSI representative is supposed to come and evaluate patient in the ED today between 10:30am-11am. CSW spoke with [facility] who has a TBI program in [place]. CSW left a message with Silvio PateShelia [phone]) in order to obtain further information about program. CSW will await a return call from care coordinator and will continue to follow.  Unk LightningHolly Lorren Splawn, [place] (ED Coverage)

## 2014-07-25 NOTE — ED Notes (Signed)
ACT at bedside 

## 2014-07-25 NOTE — ED Notes (Signed)
D: Pt becoming increasingly verbally abusive, aggressive, and agitated. Unable to deescalate verbally. Pt using profane language.  A: Paged covering MD and requested orders. Received instructions to give 2mg  Ativan IM now and follow with Geodon 10mg  IM if no results in an hour. Once pt calmed, will need EKG. Ativan 2mg  given at 2334. EKG completed at 0049. R: At 0025 pt noted to be considerably less agitated and aggressive. Pt dozing at times. EKG showed NSR, QT 0.34. MD notified. Dr. Nicanor AlconPalumbo signed off.  Will cont to monitor pt status and follow up with dose of Geodon if needed.  Kathreen Devoidarolyn Laronn Devonshire RN
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
## 2014-07-25 NOTE — ED Notes (Signed)
Pt asleep, seroquel not administered at this time.

## 2014-07-25 NOTE — ED Notes (Signed)
Pt ambulated to bathroom with sitter, extra meal tray given to patient upon return to room

## 2014-07-25 NOTE — Progress Notes (Signed)
CSW met with the patient at bedside to explain the possibility of placement in a AFL.  Patient appears to accept the idea of not returning to his mother's home although asking why he could not go to his grandmother's home.  Patient was able to talk to his brother about the situation and this helped with understanding.  Patient appeared more focused on wanting to smoke a cigarette.  CSW spoke with the brother Venora Maples [phone] he will be able to communicate with the AFL provider after 3pm and his other brother Erlene Quan get off at 4pm.  CSW encouraged Venora Maples to assist the patient with transitioning in the AFL by visiting if possible and communicating with the provider.  Venora Maples reports that he would be able to have transportation after 6pm to go out to facility.    CSW spoke with Junius Finner with APS who with call CSW in the morning to clarify about any transitioning to [facility] and payee of funds.     Chesley Noon, MSW, [place], 07/25/2014 Evening Clinical Social Worker [phone]

## 2014-07-25 NOTE — Progress Notes (Signed)
Clinical Social Work   Information faxed to [facility]. CSW still awaiting return call from Care Coordinator and another message left on voicemail. CSW will continue to follow.  Unk LightningHolly Geovanny Sartin, [place] (ED Coverage)

## 2014-07-26 DIAGNOSIS — F319 Bipolar disorder, unspecified: Secondary | ICD-10-CM | POA: Diagnosis not present

## 2014-07-26 DIAGNOSIS — R456 Violent behavior: Secondary | ICD-10-CM | POA: Diagnosis not present

## 2014-07-26 DIAGNOSIS — T43596A Underdosing of other antipsychotics and neuroleptics, initial encounter: Secondary | ICD-10-CM | POA: Diagnosis not present

## 2014-07-26 DIAGNOSIS — Z87891 Personal history of nicotine dependence: Secondary | ICD-10-CM | POA: Diagnosis not present

## 2014-07-26 DIAGNOSIS — Z87820 Personal history of traumatic brain injury: Secondary | ICD-10-CM | POA: Diagnosis not present

## 2014-07-26 DIAGNOSIS — R569 Unspecified convulsions: Secondary | ICD-10-CM | POA: Diagnosis not present

## 2014-07-26 DIAGNOSIS — Z91128 Patient's intentional underdosing of medication regimen for other reason: Secondary | ICD-10-CM | POA: Diagnosis not present

## 2014-07-26 DIAGNOSIS — R451 Restlessness and agitation: Secondary | ICD-10-CM | POA: Diagnosis present

## 2014-07-26 MED ORDER — NICOTINE 21 MG/24HR TD PT24
21.0000 mg | MEDICATED_PATCH | Freq: Once | TRANSDERMAL | Status: AC
  Administered 2014-07-26: 21 mg via TRANSDERMAL
  Filled 2014-07-26: qty 1

## 2014-07-26 MED ORDER — TUBERCULIN PPD 5 UNIT/0.1ML ID SOLN
5.0000 [IU] | Freq: Once | INTRADERMAL | Status: DC
  Administered 2014-07-26: 5 [IU] via INTRADERMAL
  Filled 2014-07-26: qty 0.1
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
## 2014-07-26 NOTE — Progress Notes (Addendum)
Pt pending AFL placement. CSW spoke with APS who is assiting with patient transition, [name] at [phone]. CSW attempting to reach patient brother [name] who is the next of kin who will need to assist with patient transfer. Per discussion with APS, patient is unable to assit with patient care due to her own mental health needs and does not have capacity at this time.   Byrd HesselbachKristen Aundrey Elahi, LCSW [phone]  ED CSW 07/26/2014 1058am   CSW called and left message for patient brother [name] at [phone] CSW called and left message for patient brother [name] at [phone]   CSW spoke with Greig Castillandrew at [phone] who states that he can sign patient in however will not have transportation until after 6pm.   CSW spoke with [name] at [facility], who stated that patient brother will need to come and see facility and sign patient in, and patient could be admitted tomorrow 10/2. CSW received the forms for MD to sign. CSW to review with MD.   Olga CoasterKristen Brileigh Sevcik, LCSW [phone]  ED CSW 07/26/2014 1356pm

## 2014-07-26 NOTE — ED Notes (Signed)
Pt very agitated and will not allow me to get his vitals.  Pt states "get away from me and fuck you!.Marland Kitchen

## 2014-07-27 ENCOUNTER — Encounter (HOSPITAL_COMMUNITY): Payer: Self-pay | Admitting: Emergency Medicine

## 2014-07-27 MED ORDER — TUBERCULIN PPD 5 UNIT/0.1ML ID SOLN: 5.0000 [IU] | Freq: Once | INTRADERMAL | Status: DC

## 2014-07-27 NOTE — ED Notes (Signed)
Nursing and sitter required to keep Pt in bed who continually threaten staff with balled fist and verbal threats.  Pt is using foul language continually after being asked not to.

## 2014-07-27 NOTE — Progress Notes (Signed)
CSW communicated with the patient's brother Cory [name] who was in route to the AFL to sign the needed paperwork.  CSW spoke with Ms. Cory [name] at the AFL who has agreed to accept the patient tonight as long as the medications will be available.  CSW spoke with Cory [name] with [facility] who reports that the medications will be delivered tonight to the AFL as their last route.  CSW typed up a resource list for the AFL including the ACTT team , medical supplier, APS worker, Care Coordinator, Dr. Karel [name], and Dr. Hyman [name].  CSW included in an envelope the original of the patient's FL2, care plan, and resource list.     6:45pm- CSW called to inform Ms. Cory [name] with the AFL that the patient is discharged and on his way to her AFL.     Cory [name], MSW, [place], 07/27/2014 Evening Clinical Social Worker [phone]

## 2014-07-27 NOTE — Progress Notes (Signed)
Pt brother [name] unable to get to group home last night to sign patient in. Patient brother trying to find transportation. CSW competed papers for group home and faxed. Waiting for time to transition patient to group home.  Byrd HesselbachKristen Najee Manninen, LCSW [phone]  ED CSW 07/27/2014 1213pm

## 2014-07-27 NOTE — ED Notes (Signed)
[facility] called for transport. 
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
## 2014-07-28 ENCOUNTER — Emergency Department (HOSPITAL_COMMUNITY)
Admission: EM | Admit: 2014-07-28 | Discharge: 2014-10-05 | Disposition: A | Discharge status: Other Acute Inpatient Hospital | Payer: Medicaid Other | Attending: Emergency Medicine | Admitting: Emergency Medicine

## 2014-07-28 ENCOUNTER — Encounter (HOSPITAL_COMMUNITY): Payer: Self-pay | Admitting: Emergency Medicine

## 2014-07-28 DIAGNOSIS — R4689 Other symptoms and signs involving appearance and behavior: Secondary | ICD-10-CM | POA: Diagnosis present

## 2014-07-28 DIAGNOSIS — R569 Unspecified convulsions: Secondary | ICD-10-CM

## 2014-07-28 DIAGNOSIS — F919 Conduct disorder, unspecified: Secondary | ICD-10-CM | POA: Diagnosis present

## 2014-07-28 DIAGNOSIS — Y9389 Activity, other specified: Secondary | ICD-10-CM | POA: Insufficient documentation

## 2014-07-28 DIAGNOSIS — G40909 Epilepsy, unspecified, not intractable, without status epilepticus: Secondary | ICD-10-CM | POA: Diagnosis not present

## 2014-07-28 DIAGNOSIS — Z79899 Other long term (current) drug therapy: Secondary | ICD-10-CM | POA: Insufficient documentation

## 2014-07-28 DIAGNOSIS — Y92128 Other place in nursing home as the place of occurrence of the external cause: Secondary | ICD-10-CM | POA: Insufficient documentation

## 2014-07-28 DIAGNOSIS — Y998 Other external cause status: Secondary | ICD-10-CM | POA: Diagnosis not present

## 2014-07-28 DIAGNOSIS — R220 Localized swelling, mass and lump, head: Secondary | ICD-10-CM

## 2014-07-28 DIAGNOSIS — S01501A Unspecified open wound of lip, initial encounter: Secondary | ICD-10-CM | POA: Diagnosis not present

## 2014-07-28 DIAGNOSIS — Z87820 Personal history of traumatic brain injury: Secondary | ICD-10-CM | POA: Insufficient documentation

## 2014-07-28 DIAGNOSIS — Z87891 Personal history of nicotine dependence: Secondary | ICD-10-CM | POA: Diagnosis not present

## 2014-07-28 DIAGNOSIS — F319 Bipolar disorder, unspecified: Secondary | ICD-10-CM | POA: Insufficient documentation

## 2014-07-28 DIAGNOSIS — S069X0S Unspecified intracranial injury without loss of consciousness, sequela: Secondary | ICD-10-CM

## 2014-07-28 DIAGNOSIS — R4189 Other symptoms and signs involving cognitive functions and awareness: Secondary | ICD-10-CM | POA: Diagnosis present

## 2014-07-28 DIAGNOSIS — S069XAA Unspecified intracranial injury with loss of consciousness status unknown, initial encounter: Secondary | ICD-10-CM | POA: Diagnosis present

## 2014-07-28 DIAGNOSIS — S069X9A Unspecified intracranial injury with loss of consciousness of unspecified duration, initial encounter: Secondary | ICD-10-CM | POA: Diagnosis present

## 2014-07-28 LAB — COMPREHENSIVE METABOLIC PANEL
ALBUMIN: 3.7 g/dL (ref 3.5–5.2)
ALK PHOS: 87 U/L (ref 39–117)
ALT: 24 U/L (ref 0–53)
ANION GAP: 14 (ref 5–15)
AST: 32 U/L (ref 0–37)
BUN: 9 mg/dL (ref 6–23)
CHLORIDE: 102 meq/L (ref 96–112)
CO2: 26 mEq/L (ref 19–32)
Calcium: 8.6 mg/dL (ref 8.4–10.5)
Creatinine, Ser: 0.91 mg/dL (ref 0.50–1.35)
GFR calc Af Amer: >90 mL/min (ref >90)
GFR calc non Af Amer: >90 mL/min (ref >90)
Glucose, Bld: 128 mg/dL — ABNORMAL HIGH (ref 70–99)
POTASSIUM: 4 meq/L (ref 3.7–5.3)
SODIUM: 142 meq/L (ref 137–147)
Total Bilirubin: 0.3 mg/dL (ref 0.3–1.2)
Total Protein: 8.4 g/dL — ABNORMAL HIGH (ref 6.0–8.3)

## 2014-07-28 LAB — CBC WITH DIFFERENTIAL/PLATELET
BASOS PCT: 0 % (ref 0–1)
Basophils Absolute: 0 10*3/uL (ref 0.0–0.1)
EOS ABS: 0.2 10*3/uL (ref 0.0–0.7)
Eosinophils Relative: 2 % (ref 0–5)
HCT: 42.3 % (ref 39.0–52.0)
HEMOGLOBIN: 14.6 g/dL (ref 13.0–17.0)
Lymphocytes Relative: 18 % (ref 12–46)
Lymphs Abs: 1.8 10*3/uL (ref 0.7–4.0)
MCH: 30.3 pg (ref 26.0–34.0)
MCHC: 34.5 g/dL (ref 30.0–36.0)
MCV: 87.8 fL (ref 78.0–100.0)
MONOS PCT: 9 % (ref 3–12)
Monocytes Absolute: 1 10*3/uL (ref 0.1–1.0)
NEUTROS ABS: 7.1 10*3/uL (ref 1.7–7.7)
NEUTROS PCT: 70 % (ref 43–77)
PLATELETS: 112 10*3/uL — AB (ref 150–400)
RBC: 4.82 MIL/uL (ref 4.22–5.81)
RDW: 15.4 % (ref 11.5–15.5)
WBC: 10.1 10*3/uL (ref 4.0–10.5)

## 2014-07-28 LAB — ETHANOL

## 2014-07-28 MED ORDER — HALOPERIDOL 5 MG PO TABS
5.0000 mg | ORAL_TABLET | Freq: Two times a day (BID) | ORAL | Status: DC
  Administered 2014-07-28 – 2014-08-03 (×10): 5 mg via ORAL
  Filled 2014-07-28 (×11): qty 1

## 2014-07-28 MED ORDER — TRAZODONE HCL 100 MG PO TABS
100.0000 mg | ORAL_TABLET | Freq: Every day | ORAL | Status: DC
  Administered 2014-07-28 – 2014-08-01 (×4): 100 mg via ORAL
  Filled 2014-07-28 (×5): qty 1

## 2014-07-28 MED ORDER — QUETIAPINE FUMARATE 50 MG PO TABS
50.0000 mg | ORAL_TABLET | Freq: Four times a day (QID) | ORAL | Status: DC
  Administered 2014-07-28 – 2014-08-03 (×17): 50 mg via ORAL
  Filled 2014-07-28 (×18): qty 1

## 2014-07-28 MED ORDER — ACETAMINOPHEN 325 MG PO TABS: 650.0000 mg | ORAL_TABLET | ORAL | Status: DC | PRN

## 2014-07-28 MED ORDER — ZIPRASIDONE HCL 20 MG PO CAPS
20.0000 mg | ORAL_CAPSULE | Freq: Two times a day (BID) | ORAL | Status: DC
  Administered 2014-07-28 – 2014-08-03 (×9): 20 mg via ORAL
  Filled 2014-07-28 (×9): qty 1

## 2014-07-28 MED ORDER — ALUM & MAG HYDROXIDE-SIMETH 200-200-20 MG/5ML PO SUSP: 30.0000 mL | ORAL | Status: DC | PRN

## 2014-07-28 MED ORDER — ONDANSETRON HCL 4 MG PO TABS
4.0000 mg | ORAL_TABLET | Freq: Three times a day (TID) | ORAL | Status: DC | PRN
  Filled 2014-07-28: qty 1

## 2014-07-28 MED ORDER — NICOTINE 21 MG/24HR TD PT24
21.0000 mg | MEDICATED_PATCH | Freq: Every day | TRANSDERMAL | Status: DC
  Administered 2014-07-29 – 2014-10-04 (×45): 21 mg via TRANSDERMAL
  Filled 2014-07-28 (×50): qty 1

## 2014-07-28 MED ORDER — IBUPROFEN 200 MG PO TABS: 600.0000 mg | ORAL_TABLET | Freq: Three times a day (TID) | ORAL | Status: DC | PRN

## 2014-07-28 MED ORDER — LORAZEPAM 1 MG PO TABS
1.0000 mg | ORAL_TABLET | Freq: Three times a day (TID) | ORAL | Status: DC | PRN
  Administered 2014-07-28 – 2014-08-18 (×20): 1 mg via ORAL
  Filled 2014-07-28 (×6): qty 1
  Filled 2014-07-28: qty 2
  Filled 2014-07-28 (×15): qty 1

## 2014-07-28 MED ORDER — ZOLPIDEM TARTRATE 5 MG PO TABS
5.0000 mg | ORAL_TABLET | Freq: Every evening | ORAL | Status: DC | PRN
  Administered 2014-08-03 – 2014-08-06 (×3): 5 mg via ORAL
  Filled 2014-07-28 (×3): qty 1

## 2014-07-28 MED ORDER — DIVALPROEX SODIUM 500 MG PO DR TAB
500.0000 mg | DELAYED_RELEASE_TABLET | Freq: Two times a day (BID) | ORAL | Status: DC
  Administered 2014-07-28 – 2014-09-02 (×63): 500 mg via ORAL
  Filled 2014-07-28 (×65): qty 1

## 2014-07-28 MED ORDER — ESCITALOPRAM OXALATE 10 MG PO TABS
10.0000 mg | ORAL_TABLET | Freq: Every day | ORAL | Status: DC
  Administered 2014-07-28 – 2014-08-22 (×25): 10 mg via ORAL
  Filled 2014-07-28 (×25): qty 1

## 2014-07-28 NOTE — ED Notes (Signed)
Bed: WA16 Expected date:  Expected time:  Means of arrival:  Comments: 

## 2014-07-28 NOTE — ED Notes (Signed)
He is mildly agitated and in no distress.  He is speaking with his brother.  Our C.N. Has requested a sitter.  He is wanded by security, and he is dressed in wine scrubs.
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
## 2014-07-28 NOTE — ED Notes (Signed)
Pt's behavior continues to escalate, continues to come into hallway, telling at passing staff. Pt having to be redirected by sitter and staff to remain in room. Pt repeatedly asking to leave and showing passing staff his arms and legs.

## 2014-07-28 NOTE — ED Notes (Signed)
He remains in no distress and repetitively asks for a nicotine patch.  Our night nurse, Autumn will get him nicotine patch asap.

## 2014-07-28 NOTE — ED Notes (Signed)
Cory [name] [phone] Password: love

## 2014-07-28 NOTE — ED Notes (Signed)
Pt given 2 sandwiches, now sitting on stretcher eating. TTS will be over to assess patient shortly.

## 2014-07-29 ENCOUNTER — Encounter (HOSPITAL_COMMUNITY): Payer: Self-pay | Admitting: Registered Nurse

## 2014-07-29 DIAGNOSIS — S069X0S Unspecified intracranial injury without loss of consciousness, sequela: Secondary | ICD-10-CM

## 2014-07-29 MED ORDER — TUBERCULIN PPD 5 UNIT/0.1ML ID SOLN
5.0000 [IU] | Freq: Once | INTRADERMAL | Status: AC
  Administered 2014-07-29: 5 [IU] via INTRADERMAL
  Filled 2014-07-29: qty 0.1

## 2014-07-29 MED ORDER — NICOTINE 21 MG/24HR TD PT24
21.0000 mg | MEDICATED_PATCH | Freq: Once | TRANSDERMAL | Status: AC
  Administered 2014-07-29: 21 mg via TRANSDERMAL
  Filled 2014-07-29: qty 1
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
## 2014-07-29 NOTE — Consult Note (Signed)
[facility] Face-to-Face Psychiatry Consult   Reason for Consult:  Wandering Referring Physician:  EDP  Cory [name] is an 33 y.o. male. Total Time spent with patient: 45 minutes  Assessment: AXIS I:  Cognitive Deficits, Traumatic Brain Injury AXIS II:  Deferred AXIS III:   Past Medical History  Diagnosis Date  . Depression     Pt was planning to start taking abilify 10/20  . Traumatic brain injury   . Seizures   . Bipolar 1 disorder    AXIS IV:  housing problems, other psychosocial or environmental problems, problems related to social environment and problems with primary support group AXIS V:  61-70 mild symptoms  Plan:  No evidence of imminent risk to self or others at present.   Patient does not meet criteria for psychiatric inpatient admission.  Subjective:   HPI:  Cory [name] is a 33 y.o. male patient recently discharge from [facility] to an assisted living facility.  Patient was brought back to [facility] related to wandering and facility states that it not a locked facility and sitting close to road and feel that it is a danger to patient. Patient sitting in hall way stating that he wants to go home. "I don't like it here. Patient denies suicidal/homicidal ideation, psychosis, and paranoia.  Patient is easily agitated and curses. Patient is usually redirectable.  Patient behaviors are usually related to his TBI and cognitive deficit and his way of coping.  Patient is cleared psychiatrically.  Social Work will continue to seek placement for patient  HPI Elements:   Location:  TBI. Quality:  Cognitive deficit. Severity:  Wandering. Timing:  1 day. Review of Systems  HENT: Negative for congestion.   Respiratory: Negative for cough, shortness of breath and wheezing.   Gastrointestinal: Negative for nausea, vomiting, abdominal pain, diarrhea and constipation.       Incont.  bowel   Genitourinary:       Incont. bladder   Neurological: Positive for seizures. Negative for dizziness, tremors and  headaches.  Psychiatric/Behavioral: Negative for depression, suicidal ideas, hallucinations and memory loss. The patient is not nervous/anxious and does not have insomnia.   All other systems reviewed and are negative.   Past Psychiatric History: Past Medical History  Diagnosis Date  . Depression     Pt was planning to start taking abilify 10/20  . Traumatic brain injury   . Seizures   . Bipolar 1 disorder     reports that he has quit smoking. He does not have any smokeless tobacco history on file. He reports that he does not drink alcohol or use illicit drugs. No family history on file.         Allergies:  No Known Allergies  ACT Assessment Complete:  Yes:    Educational Status    Risk to Self: Risk to self with the past 6 months Is patient at risk for suicide?: No Substance abuse history and/or treatment for substance abuse?: No  Risk to Others:    Abuse:    Prior Inpatient Therapy:    Prior Outpatient Therapy:    Additional Information:          Objective: Blood pressure 91/60, pulse 70, temperature 98 F (36.7 C), temperature source Oral, resp. rate 17, SpO2 95.00%.There is no weight on file to calculate BMI. Results for orders placed during the hospital encounter of 07/28/14 (from the past 72 hour(s))  CBC WITH DIFFERENTIAL     Status: Abnormal   Collection Time  07/28/14  6:47 PM      Result Value Ref Range   WBC 10.1  4.0 - 10.5 K/uL   RBC 4.82  4.22 - 5.81 MIL/uL   Hemoglobin 14.6  13.0 - 17.0 g/dL   HCT 42.3  39.0 - 52.0 %   MCV 87.8  78.0 - 100.0 fL   MCH 30.3  26.0 - 34.0 pg   MCHC 34.5  30.0 - 36.0 g/dL   RDW 15.4  11.5 - 15.5 %   Platelets 112 (*) 150 - 400 K/uL   Comment: RESULT REPEATED AND VERIFIED     SPECIMEN CHECKED FOR CLOTS     CONSISTENT WITH PREVIOUS RESULT   Neutrophils Relative % 70  43 - 77 %   Neutro Abs 7.1  1.7 - 7.7 K/uL   Lymphocytes Relative 18  12 - 46 %   Lymphs Abs 1.8  0.7 - 4.0 K/uL   Monocytes Relative 9  3 - 12 %    Monocytes Absolute 1.0  0.1 - 1.0 K/uL   Eosinophils Relative 2  0 - 5 %   Eosinophils Absolute 0.2  0.0 - 0.7 K/uL   Basophils Relative 0  0 - 1 %   Basophils Absolute 0.0  0.0 - 0.1 K/uL  COMPREHENSIVE METABOLIC PANEL     Status: Abnormal   Collection Time    07/28/14  6:47 PM      Result Value Ref Range   Sodium 142  137 - 147 mEq/L   Potassium 4.0  3.7 - 5.3 mEq/L   Chloride 102  96 - 112 mEq/L   CO2 26  19 - 32 mEq/L   Glucose, Bld 128 (*) 70 - 99 mg/dL   BUN 9  6 - 23 mg/dL   Creatinine, Ser 0.91  0.50 - 1.35 mg/dL   Calcium 8.6  8.4 - 10.5 mg/dL   Total Protein 8.4 (*) 6.0 - 8.3 g/dL   Albumin 3.7  3.5 - 5.2 g/dL   AST 32  0 - 37 U/L   Comment: SLIGHT HEMOLYSIS     HEMOLYSIS AT THIS LEVEL MAY AFFECT RESULT   ALT 24  0 - 53 U/L   Alkaline Phosphatase 87  39 - 117 U/L   Total Bilirubin 0.3  0.3 - 1.2 mg/dL   GFR calc non Af Amer >90  >90 mL/min   GFR calc Af Amer >90  >90 mL/min   Comment: (NOTE)     The eGFR has been calculated using the CKD EPI equation.     This calculation has not been validated in all clinical situations.     eGFR's persistently <90 mL/min signify possible Chronic Kidney     Disease.   Anion gap 14  5 - 15  ETHANOL     Status: None   Collection Time    07/28/14  6:47 PM      Result Value Ref Range   Alcohol, Ethyl (B) <11  0 - 11 mg/dL   Comment:            LOWEST DETECTABLE LIMIT FOR     SERUM ALCOHOL IS 11 mg/dL     FOR MEDICAL PURPOSES ONLY   Labs are reviewed see above values.  Medications reviewed and no changes made.  Current Facility-Administered Medications  Medication Dose Route Frequency Provider Last Rate Last Dose  . acetaminophen (TYLENOL) tablet 650 mg  650 mg Oral Q4H PRN Domenic Moras, PA-C      .  alum & mag hydroxide-simeth (MAALOX/MYLANTA) 200-200-20 MG/5ML suspension 30 mL  30 mL Oral PRN Domenic Moras, PA-C      . divalproex (DEPAKOTE) DR tablet 500 mg  500 mg Oral Q12H Domenic Moras, PA-C   500 mg at 07/29/14 1227  .  escitalopram (LEXAPRO) tablet 10 mg  10 mg Oral Daily Domenic Moras, PA-C   10 mg at 07/29/14 1227  . haloperidol (HALDOL) tablet 5 mg  5 mg Oral BID Domenic Moras, PA-C   5 mg at 07/29/14 1227  . ibuprofen (ADVIL,MOTRIN) tablet 600 mg  600 mg Oral Q8H PRN Domenic Moras, PA-C      . LORazepam (ATIVAN) tablet 1 mg  1 mg Oral Q8H PRN Domenic Moras, PA-C   1 mg at 07/29/14 0732  . nicotine (NICODERM CQ - dosed in mg/24 hours) patch 21 mg  21 mg Transdermal Daily Domenic Moras, PA-C   21 mg at 07/29/14 1307  . ondansetron (ZOFRAN) tablet 4 mg  4 mg Oral Q8H PRN Domenic Moras, PA-C      . QUEtiapine (SEROQUEL) tablet 50 mg  50 mg Oral QID Domenic Moras, PA-C   50 mg at 07/29/14 1313  . traZODone (DESYREL) tablet 100 mg  100 mg Oral QHS Domenic Moras, PA-C   100 mg at 07/28/14 2108  . ziprasidone (GEODON) capsule 20 mg  20 mg Oral BID WC Domenic Moras, PA-C   20 mg at 07/29/14 0732  . zolpidem (AMBIEN) tablet 5 mg  5 mg Oral QHS PRN Domenic Moras, PA-C       Current Outpatient Prescriptions  Medication Sig Dispense Refill  . acetaminophen (TYLENOL) 325 MG tablet Take 650 mg by mouth every 6 (six) hours as needed for moderate pain.      . divalproex (DEPAKOTE) 500 MG DR tablet Take 1 tablet (500 mg total) by mouth every 12 (twelve) hours.  60 tablet  0  . escitalopram (LEXAPRO) 10 MG tablet Take 1 tablet (10 mg total) by mouth daily.  30 tablet  3  . ibuprofen (ADVIL,MOTRIN) 600 MG tablet Take 600 mg by mouth every 6 (six) hours as needed for moderate pain.      Marland Kitchen LORazepam (ATIVAN) 1 MG tablet Take 1 tablet (1 mg total) by mouth every 8 (eight) hours as needed for anxiety (agitation).  30 tablet  0  . multivitamin-iron-minerals-folic acid (CENTRUM) chewable tablet Chew 1 tablet by mouth daily.      . QUEtiapine (SEROQUEL) 50 MG tablet Take 1 tablet (50 mg total) by mouth 4 (four) times daily.  150 tablet  0  . traZODone (DESYREL) 100 MG tablet Take 1 tablet (100 mg total) by mouth at bedtime.        Psychiatric Specialty Exam:      Blood pressure 91/60, pulse 70, temperature 98 F (36.7 C), temperature source Oral, resp. rate 17, SpO2 95.00%.There is no weight on file to calculate BMI.  General Appearance: Casual and Disheveled  Eye Contact::  Good  Speech:  Clear and Coherent and Slurred  Volume:  Decreased  Mood:  Irritable  Affect:  Congruent  Thought Process:  "I'm ready to go home"  Orientation:  Other:  Person and place  Thought Content:  "I don't like it here"  Suicidal Thoughts:  No  Homicidal Thoughts:  No  Memory:  Immediate;   Poor Recent;   Poor Remote;   Poor  Judgement:  Impaired  Insight:  Lacking  Psychomotor Activity:  Decreased  Concentration:  Poor  Recall:  Poor  Fund of Knowledge:Poor  Language: Poor  Akathisia:  No  Handed:  Right  AIMS (if indicated):     Assets:  Social Support  Sleep:      Musculoskeletal: Strength & Muscle Tone: within normal limits Gait & Station: unsteady, shuffle Patient leans: Front  Treatment Plan Summary:   Patient has been cleared psychiatrically.  SW to continue working with the patient for placement.  EDP can discharge once placement has been established.  Earleen Newport, FNP-BC 07/29/2014 3:30 PM  I have personally seen the patient and agreed with the findings and involved in the treatment plan. Berniece Andreas, MD
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
## 2014-07-29 NOTE — ED Provider Notes (Signed)
Medical screening examination/treatment/procedure(s) were performed by non-physician practitioner and as supervising physician I was immediately available for consultation/collaboration.   EKG Interpretation None        Rolan BuccoMelanie Lizet Kelso, MD 07/29/14 1353

## 2014-07-29 NOTE — ED Notes (Signed)
Pt continues to call RN and sitter "you piece of shit. I want to go home." Pt has moved his chair to the doorway and tells sitter to "leave me alone." Pt agitated and refusing to move chair back into room. Will continue to monitor

## 2014-07-29 NOTE — ED Notes (Signed)
Notified sitter and patient that we need urine sample.

## 2014-07-29 NOTE — ED Notes (Signed)
Patient making aggressive moves towards staff and threats. Security and [employer] at bedside.

## 2014-07-29 NOTE — Progress Notes (Addendum)
12:07 pm. Winfield Rast with [facility] group [facility] [phone]) considering pt. Sending representative this afternoon to evaluate pt.   3:01pm. CSW met with  Mitzie Na from [place] [facility] ([facility] group home. [facility] has accepted pt. Will pick up tomorrow. CSW completed FL2 form and had MD Steinl sign. CSW faxed FL2 form to [facility].   CSW called pt's brother, Venora Maples [phone]), to inform of placement. Venora Maples in agreement. Venora Maples will come to hospital to meet with group home staff and sign papers.  Rochele Pages,     ED CSW  phone: [phone]

## 2014-07-29 NOTE — ED Notes (Signed)
Patient seen for possible placement tomorrow morning.

## 2014-07-29 NOTE — ED Notes (Signed)
Patient will not allow staff to do vital signs, this tech will try after patient eats breakfast.
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
## 2014-07-29 NOTE — ED Notes (Signed)
Pt sleeping quietly at this time with even, unlabored resp. Sitter at bedside. Will continue to monitor 

## 2014-07-29 NOTE — ED Notes (Signed)
Pt shoes placed into locker 31

## 2014-07-29 NOTE — ED Notes (Signed)
Pt walked with assistance from sitter back to bed.

## 2014-07-29 NOTE — ED Notes (Signed)
Pt ambulated to BR with standby assist from sitter

## 2014-07-30 LAB — RAPID URINE DRUG SCREEN, HOSP PERFORMED
Amphetamines: NOT DETECTED
BENZODIAZEPINES: NOT DETECTED
Barbiturates: NOT DETECTED
Cocaine: NOT DETECTED
Opiates: NOT DETECTED
TETRAHYDROCANNABINOL: NOT DETECTED

## 2014-07-30 NOTE — ED Notes (Signed)
Bed: [id number] Expected date:  Expected time:  Means of arrival:  Comments: Hold for 31

## 2014-07-30 NOTE — ED Notes (Signed)
Patient is resting comfortably. Transferred to TCU on stretcher. Breathing WNL, pt is arousable.

## 2014-07-30 NOTE — ED Notes (Signed)
I will update vitals once patient is awake.

## 2014-07-30 NOTE — Progress Notes (Signed)
CSW spoke with Asher MuirJamie at [facility] regarding  Placement. Asher MuirJamie was going to speak with supervisor and call csw back.   Patient brother [name] confirmed that he can come to the hosptail to sign paperwork and assist with transition later today around 5 or 6. CSW awaiting to confirm time with facility.    Byrd HesselbachKristen Vineet Kinney, LCSW [phone]  ED CSW 07/30/2014 1149am

## 2014-07-30 NOTE — ED Notes (Signed)
Per CSW,  Pt should be discharged to a new group home today.

## 2014-07-30 NOTE — ED Notes (Signed)
Ate lunch and breakfast

## 2014-07-30 NOTE — Progress Notes (Addendum)
CSW still working on placement, and wiating on [facility]. CSW updated pt brother.   Byrd HesselbachKristen Tyshana Nishida, LCSW [phone]  ED CSW 07/30/2014 1517pm

## 2014-07-30 NOTE — Consult Note (Signed)
[facility] Psychiatry Consult   Reason for Consult:  Agitation Referring Physician:  EDP  Cory [name] is an 33 y.o. male. Total Time spent with patient: 45 minutes  Assessment: AXIS I:  Cognitive Deficits, Traumatic Brain Injury, Agitation AXIS II:  Deferred AXIS III:   Past Medical History  Diagnosis Date  . Depression     Pt was planning to start taking abilify 10/20  . Traumatic brain injury   . Seizures   . Bipolar 1 disorder    AXIS IV:  housing problems, other psychosocial or environmental problems, problems related to social environment and problems with primary support group AXIS V:  severe  Plan:  Placement Needed Long-term. Dr. Darleene Cleaver assessed patient and concurs with the plan.  Subjective:   HPI:  Cory [name] is a 33 y.o. male patient recently discharge from [facility] to an assisted living facility.  Patient was brought back to [facility] related to wandering and facility states that it not a locked facility and sitting close to road and feel that it is a danger to patient. Patient sitting in hall way stating that he wants to go home. "I don't like it here. Patient denies suicidal/homicidal ideation, psychosis, and paranoia.  Patient is easily agitated and curses. Patient is usually redirectable.  Patient behaviors are usually related to his TBI and cognitive deficit and his way of coping.  Patient is cleared psychiatrically.  Social Work will continue to seek placement for patient. Patient's restless behaviors continue at this time.  HPI Elements:   Location:  TBI. Quality:  Cognitive deficit. Severity:  Wandering. Timing:  1 day. Review of Systems  HENT: Negative for congestion.   Respiratory: Negative for cough, shortness of breath and wheezing.   Gastrointestinal: Negative for nausea, vomiting, abdominal pain, diarrhea and constipation.       Incont.  bowel   Genitourinary:       Incont. bladder   Neurological: Positive for seizures. Negative for dizziness,  tremors and headaches.  Psychiatric/Behavioral: Negative for depression, suicidal ideas, hallucinations and memory loss. The patient is not nervous/anxious and does not have insomnia.   All other systems reviewed and are negative.   Past Psychiatric History: Past Medical History  Diagnosis Date  . Depression     Pt was planning to start taking abilify 10/20  . Traumatic brain injury   . Seizures   . Bipolar 1 disorder     reports that he has quit smoking. He does not have any smokeless tobacco history on file. He reports that he does not drink alcohol or use illicit drugs. No family history on file.         Allergies:  No Known Allergies  ACT Assessment Complete:  Yes:    Educational Status    Risk to Self: Risk to self with the past 6 months Is patient at risk for suicide?: No Substance abuse history and/or treatment for substance abuse?: No  Risk to Others:    Abuse:    Prior Inpatient Therapy:    Prior Outpatient Therapy:    Additional Information:          Objective: Blood pressure 122/78, pulse 107, temperature 98.2 F (36.8 C), temperature source Oral, resp. rate 18, SpO2 97.00%.There is no weight on file to calculate BMI. Results for orders placed during the hospital encounter of 07/28/14 (from the past 72 hour(s))  CBC WITH DIFFERENTIAL     Status: Abnormal   Collection Time    07/28/14  6:47 PM  Result Value Ref Range   WBC 10.1  4.0 - 10.5 K/uL   RBC 4.82  4.22 - 5.81 MIL/uL   Hemoglobin 14.6  13.0 - 17.0 g/dL   HCT 42.3  39.0 - 52.0 %   MCV 87.8  78.0 - 100.0 fL   MCH 30.3  26.0 - 34.0 pg   MCHC 34.5  30.0 - 36.0 g/dL   RDW 15.4  11.5 - 15.5 %   Platelets 112 (*) 150 - 400 K/uL   Comment: RESULT REPEATED AND VERIFIED     SPECIMEN CHECKED FOR CLOTS     CONSISTENT WITH PREVIOUS RESULT   Neutrophils Relative % 70  43 - 77 %   Neutro Abs 7.1  1.7 - 7.7 K/uL   Lymphocytes Relative 18  12 - 46 %   Lymphs Abs 1.8  0.7 - 4.0 K/uL   Monocytes  Relative 9  3 - 12 %   Monocytes Absolute 1.0  0.1 - 1.0 K/uL   Eosinophils Relative 2  0 - 5 %   Eosinophils Absolute 0.2  0.0 - 0.7 K/uL   Basophils Relative 0  0 - 1 %   Basophils Absolute 0.0  0.0 - 0.1 K/uL  COMPREHENSIVE METABOLIC PANEL     Status: Abnormal   Collection Time    07/28/14  6:47 PM      Result Value Ref Range   Sodium 142  137 - 147 mEq/L   Potassium 4.0  3.7 - 5.3 mEq/L   Chloride 102  96 - 112 mEq/L   CO2 26  19 - 32 mEq/L   Glucose, Bld 128 (*) 70 - 99 mg/dL   BUN 9  6 - 23 mg/dL   Creatinine, Ser 0.91  0.50 - 1.35 mg/dL   Calcium 8.6  8.4 - 10.5 mg/dL   Total Protein 8.4 (*) 6.0 - 8.3 g/dL   Albumin 3.7  3.5 - 5.2 g/dL   AST 32  0 - 37 U/L   Comment: SLIGHT HEMOLYSIS     HEMOLYSIS AT THIS LEVEL MAY AFFECT RESULT   ALT 24  0 - 53 U/L   Alkaline Phosphatase 87  39 - 117 U/L   Total Bilirubin 0.3  0.3 - 1.2 mg/dL   GFR calc non Af Amer >90  >90 mL/min   GFR calc Af Amer >90  >90 mL/min   Comment: (NOTE)     The eGFR has been calculated using the CKD EPI equation.     This calculation has not been validated in all clinical situations.     eGFR's persistently <90 mL/min signify possible Chronic Kidney     Disease.   Anion gap 14  5 - 15  ETHANOL     Status: None   Collection Time    07/28/14  6:47 PM      Result Value Ref Range   Alcohol, Ethyl (B) <11  0 - 11 mg/dL   Comment:            LOWEST DETECTABLE LIMIT FOR     SERUM ALCOHOL IS 11 mg/dL     FOR MEDICAL PURPOSES ONLY  URINE RAPID DRUG SCREEN (HOSP PERFORMED)     Status: None   Collection Time    07/30/14  8:34 AM      Result Value Ref Range   Opiates NONE DETECTED  NONE DETECTED   Cocaine NONE DETECTED  NONE DETECTED   Benzodiazepines NONE DETECTED  NONE DETECTED   Amphetamines  NONE DETECTED  NONE DETECTED   Tetrahydrocannabinol NONE DETECTED  NONE DETECTED   Barbiturates NONE DETECTED  NONE DETECTED   Comment:            DRUG SCREEN FOR MEDICAL PURPOSES     ONLY.  IF CONFIRMATION IS  NEEDED     FOR ANY PURPOSE, NOTIFY LAB     WITHIN 5 DAYS.                LOWEST DETECTABLE LIMITS     FOR URINE DRUG SCREEN     Drug Class       Cutoff (ng/mL)     Amphetamine      1000     Barbiturate      200     Benzodiazepine   161     Tricyclics       096     Opiates          300     Cocaine          300     THC              50   Labs are reviewed see above values.  Medications reviewed and no changes made.  Current Facility-Administered Medications  Medication Dose Route Frequency Provider Last Rate Last Dose  . acetaminophen (TYLENOL) tablet 650 mg  650 mg Oral Q4H PRN Domenic Moras, PA-C      . alum & mag hydroxide-simeth (MAALOX/MYLANTA) 200-200-20 MG/5ML suspension 30 mL  30 mL Oral PRN Domenic Moras, PA-C      . divalproex (DEPAKOTE) DR tablet 500 mg  500 mg Oral Q12H Domenic Moras, PA-C   500 mg at 07/30/14 0915  . escitalopram (LEXAPRO) tablet 10 mg  10 mg Oral Daily Domenic Moras, PA-C   10 mg at 07/30/14 0916  . haloperidol (HALDOL) tablet 5 mg  5 mg Oral BID Domenic Moras, PA-C   5 mg at 07/30/14 0915  . ibuprofen (ADVIL,MOTRIN) tablet 600 mg  600 mg Oral Q8H PRN Domenic Moras, PA-C      . LORazepam (ATIVAN) tablet 1 mg  1 mg Oral Q8H PRN Domenic Moras, PA-C   1 mg at 07/30/14 0454  . nicotine (NICODERM CQ - dosed in mg/24 hours) patch 21 mg  21 mg Transdermal Daily Domenic Moras, PA-C   21 mg at 07/30/14 1000  . nicotine (NICODERM CQ - dosed in mg/24 hours) patch 21 mg  21 mg Transdermal Once Orlie Dakin, MD   21 mg at 07/29/14 2124  . ondansetron (ZOFRAN) tablet 4 mg  4 mg Oral Q8H PRN Domenic Moras, PA-C      . QUEtiapine (SEROQUEL) tablet 50 mg  50 mg Oral QID Domenic Moras, PA-C   50 mg at 07/30/14 0915  . traZODone (DESYREL) tablet 100 mg  100 mg Oral QHS Domenic Moras, PA-C   100 mg at 07/29/14 2123  . tuberculin injection 5 Units  5 Units Intradermal Once [name] Rankin, NP   5 Units at 07/29/14 1953  . ziprasidone (GEODON) capsule 20 mg  20 mg Oral BID WC Domenic Moras, PA-C   20 mg at  07/30/14 0810  . zolpidem (AMBIEN) tablet 5 mg  5 mg Oral QHS PRN Domenic Moras, PA-C       Current Outpatient Prescriptions  Medication Sig Dispense Refill  . acetaminophen (TYLENOL) 325 MG tablet Take 650 mg by mouth every 6 (six) hours as needed for moderate pain.      Marland Kitchen  divalproex (DEPAKOTE) 500 MG DR tablet Take 1 tablet (500 mg total) by mouth every 12 (twelve) hours.  60 tablet  0  . escitalopram (LEXAPRO) 10 MG tablet Take 1 tablet (10 mg total) by mouth daily.  30 tablet  3  . ibuprofen (ADVIL,MOTRIN) 600 MG tablet Take 600 mg by mouth every 6 (six) hours as needed for moderate pain.      Marland Kitchen LORazepam (ATIVAN) 1 MG tablet Take 1 tablet (1 mg total) by mouth every 8 (eight) hours as needed for anxiety (agitation).  30 tablet  0  . multivitamin-iron-minerals-folic acid (CENTRUM) chewable tablet Chew 1 tablet by mouth daily.      . QUEtiapine (SEROQUEL) 50 MG tablet Take 1 tablet (50 mg total) by mouth 4 (four) times daily.  150 tablet  0  . traZODone (DESYREL) 100 MG tablet Take 1 tablet (100 mg total) by mouth at bedtime.        Psychiatric Specialty Exam:     Blood pressure 122/78, pulse 107, temperature 98.2 F (36.8 C), temperature source Oral, resp. rate 18, SpO2 97.00%.There is no weight on file to calculate BMI.  General Appearance: Casual and Disheveled  Eye Contact::  Good  Speech:  Clear and Coherent and Slurred  Volume:  Decreased  Mood:  Irritable  Affect:  Congruent  Thought Process:  "I'm ready to go home"  Orientation:  Other:  Person and place  Thought Content:  "I don't like it here"  Suicidal Thoughts:  No  Homicidal Thoughts:  No  Memory:  Immediate;   Poor Recent;   Poor Remote;   Poor  Judgement:  Impaired  Insight:  Lacking  Psychomotor Activity:  Decreased  Concentration:  Poor  Recall:  Poor  Fund of Knowledge:Poor  Language: Poor  Akathisia:  No  Handed:  Right  AIMS (if indicated):     Assets:  Social Support  Sleep:       Musculoskeletal: Strength & Muscle Tone: within normal limits Gait & Station: unsteady, shuffle Patient leans: Front  Treatment Plan Summary:   Patient has been cleared psychiatrically.  SW to continue working with the patient for placement.  EDP can discharge once placement has been established.  Waylan Boga, [place] 07/30/2014 11:35 AM  Patient seen, evaluated and I agree with notes by Nurse Practitioner. Corena Pilgrim, MD

## 2014-07-31 ENCOUNTER — Encounter (HOSPITAL_COMMUNITY): Payer: Self-pay | Admitting: Registered Nurse

## 2014-07-31 DIAGNOSIS — S069X3S Unspecified intracranial injury with loss of consciousness of 1 hour to 5 hours 59 minutes, sequela: Secondary | ICD-10-CM

## 2014-07-31 NOTE — Consult Note (Signed)
Face to face evaluation and I agree with this note 

## 2014-07-31 NOTE — Progress Notes (Signed)
Pt family and facility unable to coordinator admission time. Patient family to assits with admission tomorrow.   Byrd HesselbachKristen Sharline Lehane, LCSW [phone]  ED CSW 07/31/2014 1514pm

## 2014-07-31 NOTE — BHH Counselor (Addendum)
TC from pt's brother [name] [phone]. Writer told brother that one of brothers would have to come to ED to sign papers for group home. [name] reports that he is working two jobs today. TC from brother [name] [phone] who reports he has no transportation to get to ED.  Evette Cristalaroline Paige Ednamae Schiano, [place] Assessment Counselor

## 2014-07-31 NOTE — ED Notes (Addendum)
TB Test needs to be read at 1950 on 07/31/14

## 2014-07-31 NOTE — Consult Note (Signed)
[facility] Follow Up Psychiatry Consult   Reason for Consult:  Agitation Referring Physician:  EDP  Cory [name] is an 33 y.o. male. Total Time spent with patient: 20 minutes  Assessment: AXIS I:  Cognitive Deficits, Traumatic Brain Injury, Agitation AXIS II:  Deferred AXIS III:   Past Medical History  Diagnosis Date  . Depression     Pt was planning to start taking abilify 10/20  . Traumatic brain injury   . Seizures   . Bipolar 1 disorder    AXIS IV:  housing problems, other psychosocial or environmental problems, problems related to social environment and problems with primary support group AXIS V:  severe  Plan:  Placement Needed Long-term. Dr. Lovena Le assessed patient and concurs with the plan.  Subjective:   HPI:  Cory [name] is a 33 y.o. male.  Patient continues to wait on placement at an assisted living facility.  There is a possibility that patient may go to [facility] today.  Patient states that he is ready to go home. Patient is agitated this morning because he wants to live.  Asked patient if he understood that he would be going to an assisted living facility and patient stated "I don't care; I'm ready to go.  I want a cigarette."  Patient sitting in hall outside of room with sitter.  Patient doesn't like to be confined to room.  During initial confrontation patient has states "Leave me alone but was easily redirected back into conversation.  Patient denies that he wants to hurt himself or others.    HPI Elements:   Location:  TBI. Quality:  Cognitive deficit. Severity:  Wandering. Timing:  1 day. Review of Systems  Gastrointestinal: Negative for nausea, vomiting, abdominal pain, diarrhea and constipation.       Incont.  bowel   Genitourinary:       Incont. bladder   Musculoskeletal: Negative for back pain, falls, joint pain, myalgias and neck pain.       Shuffle gait. Denies any pains  Neurological: Positive for seizures. Negative for dizziness and tremors.   Psychiatric/Behavioral: Negative for depression, suicidal ideas and hallucinations. The patient is not nervous/anxious.   All other systems reviewed and are negative.   Past Psychiatric History: Past Medical History  Diagnosis Date  . Depression     Pt was planning to start taking abilify 10/20  . Traumatic brain injury   . Seizures   . Bipolar 1 disorder     reports that he has quit smoking. He does not have any smokeless tobacco history on file. He reports that he does not drink alcohol or use illicit drugs. No family history on file.         Allergies:  No Known Allergies  ACT Assessment Complete:  Yes:    Educational Status    Risk to Self: Risk to self with the past 6 months Is patient at risk for suicide?: No Substance abuse history and/or treatment for substance abuse?: No  Risk to Others:    Abuse:    Prior Inpatient Therapy:    Prior Outpatient Therapy:    Additional Information:          Objective: Blood pressure 109/81, pulse 97, temperature 98 F (36.7 C), temperature source Oral, resp. rate 18, SpO2 98.00%.There is no weight on file to calculate BMI. Results for orders placed during the hospital encounter of 07/28/14 (from the past 72 hour(s))  CBC WITH DIFFERENTIAL     Status: Abnormal  Collection Time    07/28/14  6:47 PM      Result Value Ref Range   WBC 10.1  4.0 - 10.5 K/uL   RBC 4.82  4.22 - 5.81 MIL/uL   Hemoglobin 14.6  13.0 - 17.0 g/dL   HCT 42.3  39.0 - 52.0 %   MCV 87.8  78.0 - 100.0 fL   MCH 30.3  26.0 - 34.0 pg   MCHC 34.5  30.0 - 36.0 g/dL   RDW 15.4  11.5 - 15.5 %   Platelets 112 (*) 150 - 400 K/uL   Comment: RESULT REPEATED AND VERIFIED     SPECIMEN CHECKED FOR CLOTS     CONSISTENT WITH PREVIOUS RESULT   Neutrophils Relative % 70  43 - 77 %   Neutro Abs 7.1  1.7 - 7.7 K/uL   Lymphocytes Relative 18  12 - 46 %   Lymphs Abs 1.8  0.7 - 4.0 K/uL   Monocytes Relative 9  3 - 12 %   Monocytes Absolute 1.0  0.1 - 1.0 K/uL    Eosinophils Relative 2  0 - 5 %   Eosinophils Absolute 0.2  0.0 - 0.7 K/uL   Basophils Relative 0  0 - 1 %   Basophils Absolute 0.0  0.0 - 0.1 K/uL  COMPREHENSIVE METABOLIC PANEL     Status: Abnormal   Collection Time    07/28/14  6:47 PM      Result Value Ref Range   Sodium 142  137 - 147 mEq/L   Potassium 4.0  3.7 - 5.3 mEq/L   Chloride 102  96 - 112 mEq/L   CO2 26  19 - 32 mEq/L   Glucose, Bld 128 (*) 70 - 99 mg/dL   BUN 9  6 - 23 mg/dL   Creatinine, Ser 0.91  0.50 - 1.35 mg/dL   Calcium 8.6  8.4 - 10.5 mg/dL   Total Protein 8.4 (*) 6.0 - 8.3 g/dL   Albumin 3.7  3.5 - 5.2 g/dL   AST 32  0 - 37 U/L   Comment: SLIGHT HEMOLYSIS     HEMOLYSIS AT THIS LEVEL MAY AFFECT RESULT   ALT 24  0 - 53 U/L   Alkaline Phosphatase 87  39 - 117 U/L   Total Bilirubin 0.3  0.3 - 1.2 mg/dL   GFR calc non Af Amer >90  >90 mL/min   GFR calc Af Amer >90  >90 mL/min   Comment: (NOTE)     The eGFR has been calculated using the CKD EPI equation.     This calculation has not been validated in all clinical situations.     eGFR's persistently <90 mL/min signify possible Chronic Kidney     Disease.   Anion gap 14  5 - 15  ETHANOL     Status: None   Collection Time    07/28/14  6:47 PM      Result Value Ref Range   Alcohol, Ethyl (B) <11  0 - 11 mg/dL   Comment:            LOWEST DETECTABLE LIMIT FOR     SERUM ALCOHOL IS 11 mg/dL     FOR MEDICAL PURPOSES ONLY  URINE RAPID DRUG SCREEN (HOSP PERFORMED)     Status: None   Collection Time    07/30/14  8:34 AM      Result Value Ref Range   Opiates NONE DETECTED  NONE DETECTED   Cocaine NONE DETECTED  NONE DETECTED   Benzodiazepines NONE DETECTED  NONE DETECTED   Amphetamines NONE DETECTED  NONE DETECTED   Tetrahydrocannabinol NONE DETECTED  NONE DETECTED   Barbiturates NONE DETECTED  NONE DETECTED   Comment:            DRUG SCREEN FOR MEDICAL PURPOSES     ONLY.  IF CONFIRMATION IS NEEDED     FOR ANY PURPOSE, NOTIFY LAB     WITHIN 5 DAYS.                 LOWEST DETECTABLE LIMITS     FOR URINE DRUG SCREEN     Drug Class       Cutoff (ng/mL)     Amphetamine      1000     Barbiturate      200     Benzodiazepine   852     Tricyclics       778     Opiates          300     Cocaine          300     THC              50   Labs are reviewed see above values.  Medications reviewed and no changes made.  TB skin test will need to be read today.  Current Facility-Administered Medications  Medication Dose Route Frequency Provider Last Rate Last Dose  . acetaminophen (TYLENOL) tablet 650 mg  650 mg Oral Q4H PRN Domenic Moras, PA-C      . alum & mag hydroxide-simeth (MAALOX/MYLANTA) 200-200-20 MG/5ML suspension 30 mL  30 mL Oral PRN Domenic Moras, PA-C      . divalproex (DEPAKOTE) DR tablet 500 mg  500 mg Oral Q12H Domenic Moras, PA-C   500 mg at 07/31/14 0930  . escitalopram (LEXAPRO) tablet 10 mg  10 mg Oral Daily Domenic Moras, PA-C   10 mg at 07/31/14 0930  . haloperidol (HALDOL) tablet 5 mg  5 mg Oral BID Domenic Moras, PA-C   5 mg at 07/31/14 0930  . ibuprofen (ADVIL,MOTRIN) tablet 600 mg  600 mg Oral Q8H PRN Domenic Moras, PA-C      . LORazepam (ATIVAN) tablet 1 mg  1 mg Oral Q8H PRN Domenic Moras, PA-C   1 mg at 07/30/14 1506  . nicotine (NICODERM CQ - dosed in mg/24 hours) patch 21 mg  21 mg Transdermal Daily Domenic Moras, PA-C   21 mg at 07/31/14 0931  . ondansetron (ZOFRAN) tablet 4 mg  4 mg Oral Q8H PRN Domenic Moras, PA-C      . QUEtiapine (SEROQUEL) tablet 50 mg  50 mg Oral QID Domenic Moras, PA-C   50 mg at 07/31/14 0930  . traZODone (DESYREL) tablet 100 mg  100 mg Oral QHS Domenic Moras, PA-C   100 mg at 07/29/14 2123  . tuberculin injection 5 Units  5 Units Intradermal Once [name] Rankin, NP   5 Units at 07/29/14 1953  . ziprasidone (GEODON) capsule 20 mg  20 mg Oral BID WC Domenic Moras, PA-C   20 mg at 07/31/14 0753  . zolpidem (AMBIEN) tablet 5 mg  5 mg Oral QHS PRN Domenic Moras, PA-C       Current Outpatient Prescriptions  Medication Sig Dispense Refill  .  acetaminophen (TYLENOL) 325 MG tablet Take 650 mg by mouth every 6 (six) hours as needed for moderate pain.      . divalproex (DEPAKOTE)  500 MG DR tablet Take 1 tablet (500 mg total) by mouth every 12 (twelve) hours.  60 tablet  0  . escitalopram (LEXAPRO) 10 MG tablet Take 1 tablet (10 mg total) by mouth daily.  30 tablet  3  . ibuprofen (ADVIL,MOTRIN) 600 MG tablet Take 600 mg by mouth every 6 (six) hours as needed for moderate pain.      Marland Kitchen LORazepam (ATIVAN) 1 MG tablet Take 1 tablet (1 mg total) by mouth every 8 (eight) hours as needed for anxiety (agitation).  30 tablet  0  . multivitamin-iron-minerals-folic acid (CENTRUM) chewable tablet Chew 1 tablet by mouth daily.      . QUEtiapine (SEROQUEL) 50 MG tablet Take 1 tablet (50 mg total) by mouth 4 (four) times daily.  150 tablet  0  . traZODone (DESYREL) 100 MG tablet Take 1 tablet (100 mg total) by mouth at bedtime.        Psychiatric Specialty Exam:     Blood pressure 109/81, pulse 97, temperature 98 F (36.7 C), temperature source Oral, resp. rate 18, SpO2 98.00%.There is no weight on file to calculate BMI.  General Appearance: Casual and Disheveled  Eye Contact::  Good  Speech:  Clear and Coherent and Slurred  Volume:  Decreased  Mood:  Irritable and Patient is easily redirect when becomes agitated      Affect:  Congruent  Thought Process:  "I want to go home"  Orientation:  Other:  Person and place  Thought Content:  "I don't like it here"  Suicidal Thoughts:  No  Homicidal Thoughts:  No  Memory:  Immediate;   Poor Recent;   Poor Remote;   Poor  Judgement:  Impaired  Insight:  Lacking  Psychomotor Activity:  Decreased  Concentration:  Poor  Recall:  Poor  Fund of Knowledge:Poor  Language: Poor  Akathisia:  No  Handed:  Right  AIMS (if indicated):     Assets:  Social Support  Sleep:      Musculoskeletal: Strength & Muscle Tone: within normal limits Gait & Station: unsteady, shuffle Patient leans:  Front  Treatment Plan Summary:   Patient has been cleared psychiatrically.  SW to continue working with the patient for placement.  EDP can discharge once placement has been established.  Will continue with current treatment plan with SW seeking placement in assisted living facility.  Possibility that patient will be discharged today to [facility].  Rankin, [name], FNP-BC 07/31/2014 9:40 AM

## 2014-07-31 NOTE — ED Notes (Signed)
Patient is resting comfortably. Breathing WNL, sitter at bedside.

## 2014-07-31 NOTE — ED Notes (Signed)
Patient is resting comfortably. 

## 2014-07-31 NOTE — Progress Notes (Signed)
  CARE MANAGEMENT ED NOTE 07/31/2014  Patient:  Pablo LawrenceHARPER,Quade   Account Number:  [id number]  Date Initiated:  07/31/2014  Documentation initiated by:  Radford PaxFERRERO,Glynda Soliday  Subjective/Objective Assessment:     Subjective/Objective Assessment Detail:     Action/Plan:   Action/Plan Detail:   Anticipated DC Date:  07/31/2014     Status Recommendation to Physician:   Result of Recommendation:    Other ED Services  Consult Working Plan    DC Planning Services  Other    Choice offered to / List presented to:            Status of service:  Completed, signed off  ED Comments:   ED Comments Detail:  EDCM called [facility] at 1707pm and spoke to AldieGinnie who reports they have received the referral for incontinence supplies and gloves but it has not been shipped yet.  EDCM informed Ginnie that [facility] not [facility].  [facility]  [facility]  As per Francoise CeoGinnie they will ship supplies to new address.  No further EDCM needs at this time.

## 2014-07-31 NOTE — ED Notes (Signed)
Bed: WA18 Expected date:  Expected time:  Means of arrival:  Comments: TCU 

## 2014-07-31 NOTE — Progress Notes (Signed)
[facility]. CSW attempted to reach patient brother, and waiting return call.   Byrd HesselbachKristen Kimari Coudriet, LCSW [phone]  ED CSW 07/31/2014 1138am

## 2014-08-01 NOTE — Progress Notes (Signed)
CSW spoke with [name] at [facility] [place] at [phone] regarding placement. Patient to be interviewed tomorrow approximately around 2pm to determine admission. CSW faxed fl2 to [phone].   CSW updated patient brother [name]. [name] stated he could [place] to come and meet group home owner and complete paperwork if patient is accepted.   Byrd HesselbachKristen Deklen Popelka, LCSW [phone]  ED CSW 08/01/2014 1132am

## 2014-08-01 NOTE — ED Notes (Signed)
Sitter had to leave.  No sitter available for patient.  Pt resting peacefully

## 2014-08-01 NOTE — ED Notes (Signed)
According to psych, pt has a bed a facility, but still needs family to sign.

## 2014-08-01 NOTE — ED Notes (Signed)
1 pt belonging bag in locker #26 

## 2014-08-01 NOTE — Progress Notes (Addendum)
CSW spoke with patient brother Cory [name] who stated he will try to get a ride to the hospital after work (approx. 2:30) to complete admission paperwork for group home. CSW called [facility] [phone]  to check available time for admission and awaiting return call.   Cory HesselbachKristen Erlin Gardella, LCSW [phone]  ED CSW 08/01/2014 1025am   CSW called group home owner at home as requested number and left message. CSW received call back and unfortunatley the bed is no longer available at the group home.   Cory HesselbachKristen Jahkeem Kurka, LCSW [phone]  ED CSW 08/01/2014 1101am

## 2014-08-01 NOTE — ED Notes (Signed)
Pt is awake.  Floor tech taken off floor and used for a sitter.  Pt belligerent towards staff. Pt using foul language towards staff.  Pt making mock punches at staff.

## 2014-08-02 NOTE — ED Notes (Signed)
Juventino [place] Tuner from [employer] services came in and spoke with patient. SW paged and Tia also spoke with her on the phone. Vw, rn.

## 2014-08-02 NOTE — Progress Notes (Signed)
CSW confirmed with [name] of [facility] that she plans to interview patient at 2pm today. [name] states that after her visit and discussion with her staff she will be able to give a decision regarding admission tomorrow.   CSW updated patient brother Cory [name] [phone].  CSW updated care cooridnator Cory [name] [phone] CSW unable to reach Cory [name] at [phone] and unable to leave voicemail. CSW left message at [phone] for Cory [name].   Byrd HesselbachKristen Vayda Dungee, LCSW [phone]  ED CSW 08/02/2014 954am

## 2014-08-03 MED ORDER — ZIPRASIDONE HCL 20 MG PO CAPS
40.0000 mg | ORAL_CAPSULE | Freq: Two times a day (BID) | ORAL | Status: DC
  Administered 2014-08-03 – 2014-08-06 (×4): 40 mg via ORAL
  Filled 2014-08-03 (×4): qty 2

## 2014-08-03 NOTE — Consult Note (Signed)
[facility] Face-to[facility]Face [facility] Consult   Reason for Consult:  Wandering Referring Physician:  EDP  Cory [name] is an 33 y.o. male. Total Time spent with patient: 45 minutes  Assessment: AXIS I:  Cognitive Deficits, Traumatic Brain Injury AXIS II:  Deferred AXIS III:   Past Medical History  Diagnosis Date  . Depression     Pt was planning to start taking abilify 10/20  . Traumatic brain injury   . Seizures   . Bipolar 1 disorder    AXIS IV:  housing problems, other psychosocial or environmental problems, problems related to social environment and problems with primary support group AXIS V:  61-70 mild symptoms  Plan:  No evidence of imminent risk to self or others at present.   Patient does not meet criteria for psychiatric inpatient admission.  Subjective:   HPI:  Patient was not eating or walking yesterday, lethargic.  Haldol, Seroquel, and Trazodone discontinued and a PT consult placed to assist with his ambulation, unsteady.  He started eating today and more alert and active.  HPI Elements:   Location:  TBI. Quality:  Cognitive deficit. Severity:  Wandering. Timing:  1 day. Review of Systems  HENT: Negative for congestion.   Respiratory: Negative for cough, shortness of breath and wheezing.   Gastrointestinal: Negative for nausea, vomiting, abdominal pain, diarrhea and constipation.       Incont.  bowel   Genitourinary:       Incont. bladder   Neurological: Positive for seizures. Negative for dizziness, tremors and headaches.  Psychiatric/Behavioral: Negative for depression, suicidal ideas, hallucinations and memory loss. The patient is not nervous/anxious and does not have insomnia.   All other systems reviewed and are negative.   Past Psychiatric History: Past Medical History  Diagnosis Date  . Depression     Pt was planning to start taking abilify 10/20  . Traumatic brain injury   . Seizures   . Bipolar 1 disorder     reports that he has quit smoking. He does  not have any smokeless tobacco history on file. He reports that he does not drink alcohol or use illicit drugs. No family history on file.         Allergies:  No Known Allergies  ACT Assessment Complete:  Yes:    Educational Status    Risk to Self: Risk to self with the past 6 months Is patient at risk for suicide?: No Substance abuse history and/or treatment for substance abuse?: No  Risk to Others:    Abuse:    Prior Inpatient Therapy:    Prior Outpatient Therapy:    Additional Information:          Objective: Blood pressure 110/65, pulse 101, temperature 98.4 F (36.9 C), temperature source Oral, resp. rate 18, SpO2 98.00%.There is no weight on file to calculate BMI. No results found for this or any previous visit (from the past 72 hour(s)). Labs are reviewed see above values.  Medications reviewed and no changes made.  Current Facility-Administered Medications  Medication Dose Route Frequency Provider Last Rate Last Dose  . acetaminophen (TYLENOL) tablet 650 mg  650 mg Oral Q4H PRN [name], PA-C      . alum & mag hydroxide-simeth (MAALOX/MYLANTA) 200-200-20 MG/5ML suspension 30 mL  30 mL Oral PRN [name], PA-C      . divalproex (DEPAKOTE) DR tablet 500 mg  500 mg Oral Q12H [name], PA-C   500 mg at 08/03/14 16100821  . escitalopram (LEXAPRO) tablet 10  mg  10 mg Oral Daily [name], PA-C   10 mg at 08/03/14 16100822  . ibuprofen (ADVIL,MOTRIN) tablet 600 mg  600 mg Oral Q8H PRN [name], PA-C      . LORazepam (ATIVAN) tablet 1 mg  1 mg Oral Q8H PRN [name], NP   1 mg at 07/30/14 1506  . nicotine (NICODERM CQ - dosed in mg/24 hours) patch 21 mg  21 mg Transdermal Daily [name], PA-C   21 mg at 08/03/14 [id number]  . ondansetron (ZOFRAN) tablet 4 mg  4 mg Oral Q8H PRN [name], PA-C      . ziprasidone (GEODON) capsule 40 mg  40 mg Oral BID WC [name], NP   40 mg at 08/03/14 1624  . zolpidem (AMBIEN) tablet 5 mg  5 mg Oral QHS PRN [name], PA-C        Current Outpatient Prescriptions  Medication Sig Dispense Refill  . acetaminophen (TYLENOL) 325 MG tablet Take 650 mg by mouth every 6 (six) hours as needed for moderate pain.      . divalproex (DEPAKOTE) 500 MG DR tablet Take 1 tablet (500 mg total) by mouth every 12 (twelve) hours.  60 tablet  0  . escitalopram (LEXAPRO) 10 MG tablet Take 1 tablet (10 mg total) by mouth daily.  30 tablet  3  . ibuprofen (ADVIL,MOTRIN) 600 MG tablet Take 600 mg by mouth every 6 (six) hours as needed for moderate pain.      Marland Kitchen. LORazepam (ATIVAN) 1 MG tablet Take 1 tablet (1 mg total) by mouth every 8 (eight) hours as needed for anxiety (agitation).  30 tablet  0  . multivitamin-iron-minerals-folic acid (CENTRUM) chewable tablet Chew 1 tablet by mouth daily.      . QUEtiapine (SEROQUEL) 50 MG tablet Take 1 tablet (50 mg total) by mouth 4 (four) times daily.  150 tablet  0  . traZODone (DESYREL) 100 MG tablet Take 1 tablet (100 mg total) by mouth at bedtime.        Psychiatric Specialty Exam:     Blood pressure 110/65, pulse 101, temperature 98.4 F (36.9 C), temperature source Oral, resp. rate 18, SpO2 98.00%.There is no weight on file to calculate BMI.  General Appearance: Casual and Disheveled  Eye Contact::  Good  Speech:  Clear and Coherent and Slurred  Volume:  Decreased  Mood:  Irritable  Affect:  Congruent  Thought Process:  "I'm ready to go home"  Orientation:  Other:  Person and place  Thought Content:  "I don't like it here"  Suicidal Thoughts:  No  Homicidal Thoughts:  No  Memory:  Immediate;   Poor Recent;   Poor Remote;   Poor  Judgement:  Impaired  Insight:  Lacking  Psychomotor Activity:  Decreased  Concentration:  Poor  Recall:  Poor  Fund of Knowledge:Poor  Language: Poor  Akathisia:  No  Handed:  Right  AIMS (if indicated):     Assets:  Social Support  Sleep:      Musculoskeletal: Strength & Muscle Tone: within normal limits Gait & Station: unsteady, shuffle Patient  leans: Front  Treatment Plan Summary:   Patient has been cleared psychiatrically.  SW to continue working with the patient for placement.  EDP can discharge once placement has been established.  Nanine MeansLORD, JAMISON, PMH-NP 08/03/2014 5:16 PM   Patient seen, evaluated and I agree with notes by Nurse Practitioner. Thedore MinsMojeed Barnaby Rippeon, MD

## 2014-08-03 NOTE — Progress Notes (Addendum)
[facility] unable to offer bed.   CSW [facility] requesting to review clinicals and will return call if able to offer bed. CSW faxed information to [phone]. Contact number [phone] Jenene SlickerEmma [facility].   CSW sent updated lcinicals to APS as requested.   CSW left message for patient brother [name] at [phone]  Byrd HesselbachKristen Rey Fors, LCSW [phone]  ED CSW 08/03/2014 1538pm

## 2014-08-03 NOTE — Progress Notes (Addendum)
CSW left message with [name] [phone] regarding placement.  Pt to be visited by APS worker Tia [name].   Byrd HesselbachKristen Kedarius Aloisi, LCSW [phone]  ED CSW 08/03/2014 939am

## 2014-08-03 NOTE — ED Notes (Signed)
Psych MD and NP at the bedside with pt.

## 2014-08-03 NOTE — ED Notes (Signed)
Pt attempting to get out of bed. Pt states he wants to go home. Sitter at bedside. Pt redirected and is now sitting in chair at bedside. Security redirected pt back to chair.

## 2014-08-03 NOTE — ED Notes (Signed)
[facility] while. Will continue to monitor.

## 2014-08-04 NOTE — Evaluation (Signed)
Physical Therapy Evaluation Patient Details Name: Cory [name] MRN: [id number] DOB: 1981-10-15 Today's Date: 08/04/2014   History of Present Illness  33 year admit to ED 07/28/14 after 1 day of admission to group home, with agressive behavior and history of TBI 10 months ago.   Clinical Impression  Patient is very lethargic and sluggish today but did ambulate with a RW. Patient will benefit from PT  If patient is able to participate. Per PT note and nursing reports, patient is able to ambulate  Without AD when not medicated for behavior.    Follow Up Recommendations Supervision/Assistance - 24 hour (appropriated facility for his level. patient  has been able to ambulate when medication not affecting  motor function)    Equipment Recommendations  None recommended by PT    Recommendations for Other Services       Precautions / Restrictions Precautions Precautions: Fall Precaution Comments: can be verbally and physically abusive      Mobility  Bed Mobility Overal bed mobility: Needs Assistance Bed Mobility: Supine to Sit     Supine to sit: Min guard     General bed mobility comments: extra time , tends to be flexed in trunk, R leg tends to posture in extension with effort of sitting up.  Transfers Overall transfer level: Needs assistance Equipment used: Rolling walker (2 wheeled) Transfers: Sit to/from Stand Sit to Stand: Mod assist         General transfer comment: pt is so sluggish that he requires asistance.  Extra time to rise,  Ambulation/Gait Ambulation/Gait assistance: +2 safety/equipment;Mod assist Ambulation Distance (Feet): 60 Feet Assistive device: Rolling walker (2 wheeled) Gait Pattern/deviations: Step-to pattern;Decreased step length - right;Shuffle;Drifts right/left;Trunk flexed     General Gait Details: very slow but does advance  each leg, leans forward on RW, RW tends to be out in front.   Stairs            Wheelchair Mobility     Modified Rankin (Stroke Patients Only)       Balance Overall balance assessment: Needs assistance Sitting-balance support: Bilateral upper extremity supported;Feet supported Sitting balance-Leahy Scale: Poor Sitting balance - Comments: leaning forward, support to prevent further forward lean   Standing balance support: Bilateral upper extremity supported;During functional activity Standing balance-Leahy Scale: Poor Standing balance comment: leans forward.                             Pertinent Vitals/Pain Pain Assessment: No/denies pain    Home Living Family/patient expects to be discharged to:: Unsure                      Prior Function           Comments: pt is able to walk with spastic gait when not meduicated per last PT note     Hand Dominance        Extremity/Trunk Assessment   Upper Extremity Assessment: RUE deficits/detail RUE Deficits / Details: gross grasp on RW , stiffness with AROM, slower than L           RLE Deficits / Details: R knee hyperextends during stance, drags R leg  , decreased dorsiflexion,  abnormal tone pattern        Communication   Communication:  (speech slurred)  Cognition Arousal/Alertness: Lethargic;Suspect due to medications Behavior During Therapy: Flat affect (tardiive, sluggish) Overall Cognitive Status: Impaired/Different from baseline Area of Impairment: Attention;Following commands;Safety/judgement;Awareness  Following Commands: Follows one step commands consistently       General Comments: pt is very sluggish, stated "Ashby cone. ", not agitated at this time    General Comments      Exercises        Assessment/Plan    PT Assessment Patient needs continued PT services  PT Diagnosis Abnormality of gait;Altered mental status   PT Problem List Decreased strength;Decreased range of motion;Decreased activity tolerance;Decreased mobility;Decreased balance;Decreased  cognition;Decreased safety awareness;Impaired tone  PT Treatment Interventions DME instruction;Gait training;Functional mobility training;Therapeutic activities;Patient/family education   PT Goals (Current goals can be found in the Care Plan section) Acute Rehab PT Goals Patient Stated Goal: i want to go home PT Goal Formulation: Patient unable to participate in goal setting Time For Goal Achievement: 08/18/14 Potential to Achieve Goals: Fair    Frequency Min 2X/week   Barriers to discharge Decreased caregiver support      Co-evaluation               End of Session Equipment Utilized During Treatment: Gait belt Activity Tolerance: Patient tolerated treatment well Patient left: with nursing/sitter in room (on toilet) Nurse Communication: Mobility status    Functional Assessment Tool Used: clinical judgement Functional Limitation: Mobility: Walking and moving around Mobility: Walking and Moving Around Current Status ([id number](G8978): At least 60 percent but less than 80 percent impaired, limited or restricted Mobility: Walking and Moving Around Goal Status [phone]([id number]): At least 1 percent but less than 20 percent impaired, limited or restricted    Time: 0930-0955 PT Time Calculation (min): 25 min   Charges:   PT Evaluation $Initial PT Evaluation Tier I: 1 Procedure PT Treatments $Gait Training: 23-37 mins   PT G Codes:   Functional Assessment Tool Used: clinical judgement Functional Limitation: Mobility: Walking and moving around    [place], Athziry Millican Elizabeth 08/04/2014, 10:24 AM Blanchard KelchKaren Jadalyn Oliveri PT [phone]

## 2014-08-04 NOTE — ED Notes (Signed)
Pt alert and oriented x 2. Person and place. Pt  Breathing WNL. Follows commands presently.

## 2014-08-04 NOTE — Consult Note (Signed)
[facility] Face-to[facility]Face [facility] Consult   Reason for Consult:  Wandering Referring Physician:  EDP  Cory [name] is an 33 y.o. male. Total Time spent with patient: 45 minutes  Assessment: AXIS I:  Cognitive Deficits, Traumatic Brain Injury AXIS II:  Deferred AXIS III:   Past Medical History  Diagnosis Date  . Depression     Pt was planning to start taking abilify 10/20  . Traumatic brain injury   . Seizures   . Bipolar 1 disorder    AXIS IV:  housing problems, other psychosocial or environmental problems, problems related to social environment and problems with primary support group AXIS V:  61-70 mild symptoms  Plan:  No evidence of imminent risk to self or others at present.   Patient does not meet criteria for psychiatric inpatient admission.  Subjective:   HPI:  Patient is more alert and sitting watching television, has been eating meals since medication adjustments. Requests to "leave me alone", forwards little information, placement being sought.  Physical therapy did work with him on his ambulation.  HPI Elements:   Location:  TBI. Quality:  Cognitive deficit. Severity:  Wandering. Timing:  1 day. Review of Systems  HENT: Negative for congestion.   Respiratory: Negative for cough, shortness of breath and wheezing.   Gastrointestinal: Negative for nausea, vomiting, abdominal pain, diarrhea and constipation.       Incont.  bowel   Genitourinary:       Incont. bladder   Neurological: Positive for seizures. Negative for dizziness, tremors and headaches.  Psychiatric/Behavioral: Negative for depression, suicidal ideas, hallucinations and memory loss. The patient is not nervous/anxious and does not have insomnia.   All other systems reviewed and are negative.   Past Psychiatric History: Past Medical History  Diagnosis Date  . Depression     Pt was planning to start taking abilify 10/20  . Traumatic brain injury   . Seizures   . Bipolar 1 disorder     reports that  he has quit smoking. He does not have any smokeless tobacco history on file. He reports that he does not drink alcohol or use illicit drugs. No family history on file.         Allergies:  No Known Allergies  ACT Assessment Complete:  Yes:    Educational Status    Risk to Self: Risk to self with the past 6 months Is patient at risk for suicide?: No Substance abuse history and/or treatment for substance abuse?: No  Risk to Others:    Abuse:    Prior Inpatient Therapy:    Prior Outpatient Therapy:    Additional Information:          Objective: Blood pressure 117/70, pulse 94, temperature 98.3 F (36.8 C), temperature source Oral, resp. rate 18, SpO2 98.00%.There is no weight on file to calculate BMI. No results found for this or any previous visit (from the past 72 hour(s)). Labs are reviewed see above values.  Medications reviewed and no changes made.  Current Facility-Administered Medications  Medication Dose Route Frequency Provider Last Rate Last Dose  . acetaminophen (TYLENOL) tablet 650 mg  650 mg Oral Q4H PRN [name], PA-C      . alum & mag hydroxide-simeth (MAALOX/MYLANTA) 200-200-20 MG/5ML suspension 30 mL  30 mL Oral PRN [name], PA-C      . divalproex (DEPAKOTE) DR tablet 500 mg  500 mg Oral Q12H [name], PA-C   500 mg at 08/04/14 0912  . escitalopram (LEXAPRO) tablet  10 mg  10 mg Oral Daily [name], PA-C   10 mg at 08/04/14 0912  . ibuprofen (ADVIL,MOTRIN) tablet 600 mg  600 mg Oral Q8H PRN [name], PA-C      . LORazepam (ATIVAN) tablet 1 mg  1 mg Oral Q8H PRN [name], NP   1 mg at 08/03/14 2300  . nicotine (NICODERM CQ - dosed in mg/24 hours) patch 21 mg  21 mg Transdermal Daily [name], PA-C   21 mg at 08/04/14 0917  . ondansetron (ZOFRAN) tablet 4 mg  4 mg Oral Q8H PRN [name], PA-C      . ziprasidone (GEODON) capsule 40 mg  40 mg Oral BID WC [name], NP   40 mg at 08/04/14 0912  . zolpidem (AMBIEN) tablet 5 mg  5 mg Oral QHS PRN  [name], PA-C   5 mg at 08/03/14 2141   Current Outpatient Prescriptions  Medication Sig Dispense Refill  . acetaminophen (TYLENOL) 325 MG tablet Take 650 mg by mouth every 6 (six) hours as needed for moderate pain.      . divalproex (DEPAKOTE) 500 MG DR tablet Take 1 tablet (500 mg total) by mouth every 12 (twelve) hours.  60 tablet  0  . escitalopram (LEXAPRO) 10 MG tablet Take 1 tablet (10 mg total) by mouth daily.  30 tablet  3  . ibuprofen (ADVIL,MOTRIN) 600 MG tablet Take 600 mg by mouth every 6 (six) hours as needed for moderate pain.      Marland Kitchen. LORazepam (ATIVAN) 1 MG tablet Take 1 tablet (1 mg total) by mouth every 8 (eight) hours as needed for anxiety (agitation).  30 tablet  0  . multivitamin-iron-minerals-folic acid (CENTRUM) chewable tablet Chew 1 tablet by mouth daily.      . QUEtiapine (SEROQUEL) 50 MG tablet Take 1 tablet (50 mg total) by mouth 4 (four) times daily.  150 tablet  0  . traZODone (DESYREL) 100 MG tablet Take 1 tablet (100 mg total) by mouth at bedtime.        Psychiatric Specialty Exam:     Blood pressure 117/70, pulse 94, temperature 98.3 F (36.8 C), temperature source Oral, resp. rate 18, SpO2 98.00%.There is no weight on file to calculate BMI.  General Appearance: Casual and Disheveled  Eye Contact::  Good  Speech:  Clear and Coherent and Slurred  Volume:  Decreased  Mood:  Irritable  Affect:  Congruent  Thought Process:  "I'm ready to go home"  Orientation:  Other:  Person and place  Thought Content:  "I don't like it here"  Suicidal Thoughts:  No  Homicidal Thoughts:  No  Memory:  Immediate;   Poor Recent;   Poor Remote;   Poor  Judgement:  Impaired  Insight:  Lacking  Psychomotor Activity:  Decreased  Concentration:  Poor  Recall:  Poor  Fund of Knowledge:Poor  Language: Poor  Akathisia:  No  Handed:  Right  AIMS (if indicated):     Assets:  Social Support  Sleep:      Musculoskeletal: Strength & Muscle Tone: within normal  limits Gait & Station: unsteady, shuffle Patient leans: Front  Treatment Plan Summary:   Patient has been cleared psychiatrically.  SW to continue working with the patient for placement.  EDP can discharge once placement has been established.   Nanine MeansLORD, JAMISON, PMH-NP 08/04/2014 2:25 PM Patient seen and I agree with treatment and plan Diannia Rudereborah Mykenzie Ebanks MD

## 2014-08-04 NOTE — ED Notes (Signed)
Bed: WA12 Expected date:  Expected time:  Means of arrival:  Comments: TCU 

## 2014-08-04 NOTE — ED Notes (Signed)
[name], pt's mom, at [phone] but no answer. Message left for mom. Vwilliams,rn.

## 2014-08-04 NOTE — ED Notes (Signed)
Pt refused to eat lunch at this time.

## 2014-08-04 NOTE — ED Notes (Signed)
Pt refused to eat his dinner at this time.

## 2014-08-04 NOTE — ED Notes (Signed)
Pt ate 10% of his breakfast.

## 2014-08-05 NOTE — Progress Notes (Signed)
LATE ENTRY  09/04/14 11:00am CSW left message for [facility] to follow up on referral sent yesterday. Left message x2.  Mariann LasterAlexandra Surya Schroeter LCSWA,     ED CSW  phone: [phone]

## 2014-08-05 NOTE — ED Notes (Signed)
Pt is incontinent of bowel and bladder, sitter notified this RN that pt smelled as if he needed his depend changed however pt adamantly refused to let her check or intervene.

## 2014-08-05 NOTE — Progress Notes (Signed)
Patient ID: Cory [name], male   DOB: 1981/02/28, 33 y.o.   MRN: [id number] Cory Pattenykwan [id number]  SUBJECTIVE Patient was seen with Dr Tenny CrawossPablo Lawrence this morning on rounds.  Patient was more alert and participated in the assessment interview.  He denied feeling nervous, stated that he is not scared today and denies auditory hallucination.  He reported good sleep but did not eat his breakfast because he was sleeping.  Patient denies SI/HI/AVH.  We are still waiting for for placement for the patient meanwhile we will continue to take care of his needs.  Mental Status Examination Psychiatric Specialty Exam: Physical Exam  ROS  Blood pressure 108/65, pulse 89, temperature 97.8 F (36.6 C), temperature source Oral, resp. rate 16, SpO2 98.00%.There is no weight on file to calculate BMI.  General Appearance: Casual  Eye Contact::  Fair  Speech:  Clear and Coherent and Normal Rate  Volume:  Normal  Mood:  Depressed  Affect:  Congruent, Depressed and Flat  Thought Process:  Coherent and Intact  Orientation:  Full (Time, Place, and Person)  Thought Content:  WDL  Suicidal Thoughts:  No  Homicidal Thoughts:  No  Memory:  Immediate;   Fair Recent;   Fair Remote;   Fair  Judgement:  Fair  Insight:  Fair  Psychomotor Activity:  Normal  Concentration:  Good  Recall:  NA  Akathisia:  NA  Handed:  Right  AIMS (if indicated):     Assets:  Desire for Improvement  Sleep:        Current MedicationCurrent facility-administered medications:acetaminophen (TYLENOL) tablet 650 mg, 650 mg, Oral, Q4H PRN, [name], PA-C;  alum & mag hydroxide-simeth (MAALOX/MYLANTA) 200-200-20 MG/5ML suspension 30 mL, 30 mL, Oral, PRN, [name], PA-C;  divalproex (DEPAKOTE) DR tablet 500 mg, 500 mg, Oral, Q12H, [name], PA-C, 500 mg at 08/05/14 1007;  escitalopram (LEXAPRO) tablet 10 mg, 10 mg, Oral, Daily, [name], PA-C, 10 mg at 08/05/14 1007 ibuprofen (ADVIL,MOTRIN) tablet 600 mg, 600 mg, Oral, Q8H PRN,  [name], PA-C;  LORazepam (ATIVAN) tablet 1 mg, 1 mg, Oral, Q8H PRN, [name], NP, 1 mg at 08/04/14 2233;  nicotine (NICODERM CQ - dosed in mg/24 hours) patch 21 mg, 21 mg, Transdermal, Daily, [name], PA-C, 21 mg at 08/05/14 1008;  ondansetron (ZOFRAN) tablet 4 mg, 4 mg, Oral, Q8H PRN, [name], PA-C ziprasidone (GEODON) capsule 40 mg, 40 mg, Oral, BID WC, [name], NP, 40 mg at 08/05/14 1007;  zolpidem (AMBIEN) tablet 5 mg, 5 mg, Oral, QHS PRN, [name], PA-C, 5 mg at 08/04/14 2233 Current outpatient prescriptions:acetaminophen (TYLENOL) 325 MG tablet, Take 650 mg by mouth every 6 (six) hours as needed for moderate pain., Disp: , Rfl: ;  divalproex (DEPAKOTE) 500 MG DR tablet, Take 1 tablet (500 mg total) by mouth every 12 (twelve) hours., Disp: 60 tablet, Rfl: 0;  escitalopram (LEXAPRO) 10 MG tablet, Take 1 tablet (10 mg total) by mouth daily., Disp: 30 tablet, Rfl: 3 ibuprofen (ADVIL,MOTRIN) 600 MG tablet, Take 600 mg by mouth every 6 (six) hours as needed for moderate pain., Disp: , Rfl: ;  LORazepam (ATIVAN) 1 MG tablet, Take 1 tablet (1 mg total) by mouth every 8 (eight) hours as needed for anxiety (agitation)., Disp: 30 tablet, Rfl: 0;  multivitamin-iron-minerals-folic acid (CENTRUM) chewable tablet, Chew 1 tablet by mouth daily., Disp: , Rfl:  QUEtiapine (SEROQUEL) 50 MG tablet, Take 1 tablet (50 mg total) by mouth 4 (four) times daily., Disp: 150 tablet, Rfl: 0;  traZODone (DESYREL) 100 MG tablet, Take 1 tablet (100 mg total) by mouth at bedtime., Disp: , Rfl:    Assessment Axis 1: Cognitive Deficits, Traumatic Brain Injury  Plan: Waiting for social worker placement.   Continue to offer medications as prescribed  Dahlia ByesJosephine Onuoha   PMHNP-BC Patient seen and I agree with treatment and plan Diannia Rudereborah Olsen Mccutchan MD

## 2014-08-06 ENCOUNTER — Encounter (HOSPITAL_COMMUNITY): Payer: Self-pay | Admitting: Psychiatry

## 2014-08-06 DIAGNOSIS — F068 Other specified mental disorders due to known physiological condition: Secondary | ICD-10-CM

## 2014-08-06 LAB — VALPROIC ACID LEVEL: VALPROIC ACID LVL: 55.3 ug/mL (ref 50.0–100.0)

## 2014-08-06 MED ORDER — ZIPRASIDONE HCL 20 MG PO CAPS
20.0000 mg | ORAL_CAPSULE | Freq: Two times a day (BID) | ORAL | Status: DC
  Administered 2014-08-06 – 2014-08-17 (×22): 20 mg via ORAL
  Filled 2014-08-06 (×22): qty 1

## 2014-08-06 NOTE — ED Notes (Signed)
Pt very lethargic this morning.  Reported that pt stayed awake in the night.

## 2014-08-06 NOTE — Progress Notes (Addendum)
Pt still pending placement to new group home or assisted living.   Pt being followed by APS [name] [phone])  for placement, guardianship, and payee needs. CSW left message for  Ms. [name], to provide updates. Patient does not currently have a guardian or a payee. Patient was living with patient mother, however due to patient mother's situation, APS recommends that patient mother is not appropriate to continue to be primary caregiver for patient.   Pt being followed by care coordinator Vaughan Sinehedosia Williams [phone]). CSW left message for updates, and to inquire about any placements care coordinator was able to locate.   [facility] by Jenene SlickerEmma [facility] [phone]: [phone]  Due to patient being 2+ assist with physical therapy, patient may need skilled nursing. Patient is sluggish and requiring  to use a walker. [facility]    Pt [facility] and [facility] : [facility], [facility], [facility] (placement failed).    Byrd HesselbachKristen Lakesa Coste, LCSW [phone]  ED CSW 08/06/2014

## 2014-08-06 NOTE — Progress Notes (Signed)
Patient still pending placement. [facility]. [facility]. Patient to be evaluated by [facility] on Wednesday (not able to evalute sooner). Patient also being considered at [facility] facilities.   Byrd HesselbachKristen Baylin Gamblin, LCSW [phone]  ED CSW 08/06/2014 1334pm

## 2014-08-06 NOTE — Consult Note (Addendum)
[facility]   Reason for Consult:  Wandering Referring Physician:  EDP  Cory [name] is an 33 y.o. male. Total Time spent with patient: 45 minutes  Assessment: AXIS I:  Cognitive Deficits, Traumatic Brain Injury AXIS II:  Deferred AXIS III:   Past Medical History  Diagnosis Date  . Depression     Pt was planning to start taking abilify 10/20  . Traumatic brain injury   . Seizures   . Bipolar 1 disorder    AXIS IV:  housing problems, other psychosocial or environmental problems, problems related to social environment and problems with primary support group AXIS V: Moderate to severe  Plan:  No evidence of imminent risk to self or others at present.   Patient does not meet criteria for psychiatric inpatient admission.  Subjective:   HPI:  Patient awaits placement.  His motor skills have improved but he is now having to use a walker to ambulate.  His Geodon was decreased from 40 mg BID to 20 mg BID and his Ambien 5 mg at bedtime, Depakote level ordered.  HPI Elements:   Location:  TBI. Quality:  Cognitive deficit. Severity:  Wandering. Timing:  1 day. Review of Systems  HENT: Negative for congestion.   Respiratory: Negative for cough, shortness of breath and wheezing.   Gastrointestinal: Negative for nausea, vomiting, abdominal pain, diarrhea and constipation.       Incont.  bowel   Genitourinary:       Incont. bladder   Neurological: Positive for seizures. Negative for dizziness, tremors and headaches.  Psychiatric/Behavioral: Negative for depression, suicidal ideas, hallucinations and memory loss. The patient is not nervous/anxious and does not have insomnia.   All other systems reviewed and are negative.   Past Psychiatric History: Past Medical History  Diagnosis Date  . Depression     Pt was planning to start taking abilify 10/20  . Traumatic brain injury   . Seizures   . Bipolar 1 disorder     reports that he has quit smoking. He does  not have any smokeless tobacco history on file. He reports that he does not drink alcohol or use illicit drugs. History reviewed. No pertinent family history.         Allergies:  No Known Allergies  ACT Assessment Complete:  Yes:    Educational Status    Risk to Self: Risk to self with the past 6 months Is patient at risk for suicide?: No Substance abuse history and/or treatment for substance abuse?: No  Risk to Others:    Abuse:    Prior Inpatient Therapy:    Prior Outpatient Therapy:    Additional Information:          Objective: Blood pressure 106/57, pulse 87, temperature 98.5 F (36.9 C), temperature source Oral, resp. rate 17, SpO2 99.00%.There is no weight on file to calculate BMI. No results found for this or any previous visit (from the past 72 hour(s)). Labs are reviewed see above values.  Medications reviewed and no changes made.  Current Facility-Administered Medications  Medication Dose Route Frequency Provider Last Rate Last Dose  . acetaminophen (TYLENOL) tablet 650 mg  650 mg Oral Q4H PRN [name], PA-C      . alum & mag hydroxide-simeth (MAALOX/MYLANTA) 200-200-20 MG/5ML suspension 30 mL  30 mL Oral PRN [name], PA-C      . divalproex (DEPAKOTE) DR tablet 500 mg  500 mg Oral Q12H [name], PA-C   500 mg at  08/05/14 2150  . escitalopram (LEXAPRO) tablet 10 mg  10 mg Oral Daily [name], PA-C   10 mg at 08/05/14 1007  . ibuprofen (ADVIL,MOTRIN) tablet 600 mg  600 mg Oral Q8H PRN [name], PA-C      . LORazepam (ATIVAN) tablet 1 mg  1 mg Oral Q8H PRN [name], NP   1 mg at 08/06/14 0005  . nicotine (NICODERM CQ - dosed in mg/24 hours) patch 21 mg  21 mg Transdermal Daily [name], PA-C   21 mg at 08/05/14 1008  . ondansetron (ZOFRAN) tablet 4 mg  4 mg Oral Q8H PRN [name], PA-C      . ziprasidone (GEODON) capsule 20 mg  20 mg Oral BID WC [name], NP      . zolpidem (AMBIEN) tablet 5 mg  5 mg Oral QHS PRN [name], PA-C   5 mg at  08/06/14 0005   Current Outpatient Prescriptions  Medication Sig Dispense Refill  . acetaminophen (TYLENOL) 325 MG tablet Take 650 mg by mouth every 6 (six) hours as needed for moderate pain.      . divalproex (DEPAKOTE) 500 MG DR tablet Take 1 tablet (500 mg total) by mouth every 12 (twelve) hours.  60 tablet  0  . escitalopram (LEXAPRO) 10 MG tablet Take 1 tablet (10 mg total) by mouth daily.  30 tablet  3  . ibuprofen (ADVIL,MOTRIN) 600 MG tablet Take 600 mg by mouth every 6 (six) hours as needed for moderate pain.      Marland Kitchen. LORazepam (ATIVAN) 1 MG tablet Take 1 tablet (1 mg total) by mouth every 8 (eight) hours as needed for anxiety (agitation).  30 tablet  0  . multivitamin-iron-minerals-folic acid (CENTRUM) chewable tablet Chew 1 tablet by mouth daily.      . QUEtiapine (SEROQUEL) 50 MG tablet Take 1 tablet (50 mg total) by mouth 4 (four) times daily.  150 tablet  0  . traZODone (DESYREL) 100 MG tablet Take 1 tablet (100 mg total) by mouth at bedtime.        Psychiatric Specialty Exam:     Blood pressure 106/57, pulse 87, temperature 98.5 F (36.9 C), temperature source Oral, resp. rate 17, SpO2 99.00%.There is no weight on file to calculate BMI.  General Appearance: Casual and Disheveled  Eye Contact::  Good  Speech:  Clear and Coherent and Slurred  Volume:  Decreased  Mood:  Irritable  Affect:  Congruent  Thought Process:  "I'm ready to go home"  Orientation:  Other:  Person and place  Thought Content:  "I don't like it here"  Suicidal Thoughts:  No  Homicidal Thoughts:  No  Memory:  Immediate;   Poor Recent;   Poor Remote;   Poor  Judgement:  Impaired  Insight:  Lacking  Psychomotor Activity:  Decreased  Concentration:  Poor  Recall:  Poor  Fund of Knowledge:Poor  Language: Poor  Akathisia:  No  Handed:  Right  AIMS (if indicated):     Assets:  Social Support  Sleep:      Musculoskeletal: Strength & Muscle Tone: within normal limits Gait & Station: unsteady,  shuffle Patient leans: Front  Treatment Plan Summary: Psychiatrist to monitor his medications, placement being sought.  Nanine MeansLORD, JAMISON, PMH-NP 08/06/2014 8:28 AM   Patient seen, evaluated and I agree with notes by Nurse Practitioner. Thedore MinsMojeed Shaleta Ruacho, MD

## 2014-08-07 NOTE — Progress Notes (Signed)
[facility] and they plan to evaluate patient for placement tomorrow. As patient has begun to improve physically with medication adjustments, pt disposition remains for a group home instead of a skilled nursing facility. CSW to follow up with Mr. [facility]al [facility] at [phone].   Byrd HesselbachKristen Eirik Schueler, LCSW [phone]  ED CSW 08/07/2014 11:55am

## 2014-08-07 NOTE — ED Notes (Signed)
Denies SI/HI; denies AH/VH

## 2014-08-07 NOTE — Progress Notes (Signed)
Pt reamins waiting for placement. [facility]. Pt to be evaluated by PT today as well. Patient previously needing 2+ assist, patient seen standing in doorway independently.   Byrd HesselbachKristen Gavan Nordby, LCSW [phone]  ED CSW 08/07/2014 922am

## 2014-08-07 NOTE — Progress Notes (Signed)
08/07/2014 A. Shakesha Soltau RNCM 1938pm EDCM called [facility] at [employer] and spoke to Lauderdale LakesJonathan.  EDCM informed Christiane HaJonathan that [facility]  As per Christiane HaJonathan, incontinence supplies have not been sent out.  Christiane HaJonathan reports [facility] will not send out incontinence supplies until they have spoken to patient's pcp and have the correct adress and contact information of caregiver.  EDCM to update [facility] of this information when patient has disposition.  No further EDCM needs at this time.

## 2014-08-08 NOTE — ED Notes (Signed)
Psych MD at the bedside

## 2014-08-08 NOTE — Progress Notes (Signed)
CSW received call from Burnell BlanksJacklyn Deese, patient aunt who is inquiring about patient care and wanting to assist with pt placement. CSW explained that pt brothers have been assisting with patient care and would need to have permission from either [place] or [place]. CSW left message with [name]. Pt aunt states she is willing to assist in anyway possible to help find patient an appropriate group home or facility. Pt aunt states that pt brothers have expressed to her not being able to help or understand what to do for patient and she would like to step in and help.   Byrd HesselbachKristen Jaquanna Ballentine, LCSW [phone]  ED CSW 08/08/2014 1030am

## 2014-08-08 NOTE — Progress Notes (Signed)
Pt brother returned csw call. CSW provided pt brother with updates on pending placement. Patient brother gave permission to speak with pt aunt Marshia LyJackyn Deese.   Byrd HesselbachKristen Levon Penning, LCSW [phone]  ED CSW 08/08/2014 1117am

## 2014-08-08 NOTE — ED Notes (Addendum)
Pt attempting to sit on the side of the bed but due to his slick bed linen and pad on his bed he began to slide and then guided himself to the floor. Pt denies that he hit his head, also denies any pain, VSS, MD and charge RN made aware, no new orders given and fall huddle called. Will continue to monitor.

## 2014-08-08 NOTE — Progress Notes (Signed)
PT Cancellation Note  ___Treatment cancelled today due to medical issues with patient which prohibited therapy  ___ Treatment cancelled today due to patient receiving procedure or test   ___ Treatment cancelled today due to patient's refusal to participate   _X_ Treatment cancelled due to nursing request to allow pt to sleep.  Will return as schedule permits.   Felecia ShellingLori Kayon Dozier  PTA [facility]  Acute  Rehab Pager      [phone]

## 2014-08-08 NOTE — ED Notes (Signed)
Bed: [id number] Expected date:  Expected time:  Means of arrival:  Comments: Hold for TCU 26

## 2014-08-08 NOTE — Progress Notes (Signed)
CSW spoke with PT who states that they will be coming to see patient first this morning. Yesterday patient was sleeping and nursing did not want to wake him.   Byrd HesselbachKristen Jamear Carbonneau, LCSW [phone]  ED CSW 08/08/2014 836am

## 2014-08-08 NOTE — Progress Notes (Signed)
Physical Therapy Treatment Patient Details Name: Pablo Lawrenceykwan Scroggins MRN: [id number] DOB: Aug 13, 1981 Today's Date: 08/08/2014    History of Present Illness 33 year admit to ED 07/28/14 after 1 day of admission to group home, with agressive behavior and history of TBI 10 months ago.     PT Comments    Patient is more alert today and less obtunded than previous session. Speaking and not druelling. Patient  Appears to best with F/C when his sitter is "In  Charge" and provides instruction as he is familiar with them.Patient did ambulate with poor balance. Recommend that nursing use RW (which is in pt's room) to ambulate .   Follow Up Recommendations  Supervision/Assistance - 24 hour (uncertain if patient would cooperate with HHPT or in SNF.)     Equipment Recommendations  Rolling walker with 5" wheels (does better with UE support)    Recommendations for Other Services       Precautions / Restrictions Precautions Precautions: Fall Precaution Comments: can be verbally and physically abusive    Mobility  Bed Mobility Overal bed mobility: Needs Assistance Bed Mobility: Supine to Sit;Sit to Supine     Supine to sit: Min assist Sit to supine: Min assist   General bed mobility comments: pt rolls over in  flexed posture  to sit up and to lie down, sat to very edge of bed,  redirected to  get onto bed more to prevent sliding off as pt just sat and did not move. CNA gave  redirection as appeared too many people in room giving directions, Does best when his assisgned sitter/CNA   gives pt. direction.  Transfers Overall transfer level: Needs assistance   Transfers: Sit to/from Stand Sit to Stand: Mod assist         General transfer comment: pt stands up from bed, Noted posturing R UE?LE, trunk remanis flexed and slightly rotated to R.  Ambulation/Gait Ambulation/Gait assistance: Mod assist;+2 physical assistance;+2 safety/equipment Ambulation Distance (Feet): 160 Feet Assistive device:  1 person hand held assist Gait Pattern/deviations: Step-to pattern;Step-through pattern;Festinating;Staggering right;Drifts right/left;Trunk flexed;Decreased weight shift to left;Decreased step length - right   Gait velocity interpretation: <1.8 ft/sec, indicative of risk for recurrent falls General Gait Details: balance is poor when moving forward. Momentum tends to cause loss of balance, regains when stops walking then starts again.RW was not available at beginning of session. Later found behind the door. Gait presents with   Stairs            Wheelchair Mobility    Modified Rankin (Stroke Patients Only)       Balance Overall balance assessment: Needs assistance Sitting-balance support: Feet supported;No upper extremity supported Sitting balance-Leahy Scale: Poor Sitting balance - Comments: leaning forward, support to prevent further forward lean   Standing balance support: During functional activity;No upper extremity supported;Single extremity supported Standing balance-Leahy Scale: Poor                      Cognition Arousal/Alertness: Awake/alert Behavior During Therapy: Restless   Area of Impairment: Attention;Safety/judgement   Current Attention Level: Selective   Following Commands: Follows one step commands inconsistently Safety/Judgement: Decreased awareness of deficits;Decreased awareness of safety     General Comments: constantly stating " Shut UP" , "Want to watch TV" CNA' redirecting patient, verbal,and tactile cues for mobility OOB, back to bed    Exercises      General Comments        Pertinent Vitals/Pain Pain Assessment: Faces Faces Pain  Scale: No hurt    Home Living                      Prior Function            PT Goals (current goals can now be found in the care plan section) Progress towards PT goals: Progressing toward goals    Frequency  Min 2X/week    PT Plan Current plan remains appropriate     Co-evaluation             End of Session Equipment Utilized During Treatment: Gait belt Activity Tolerance: Treatment limited secondary to agitation Patient left: in bed;with call bell/phone within reach;with nursing/sitter in room     Time: 0850-0858 PT Time Calculation (min): 8 min  Charges:  $Gait Training: 8-22 mins                    G Codes:      Rada HayHill, Anuja Manka Elizabeth 08/08/2014, 9:12 AM Blanchard KelchKaren Jaiyana Canale PT [phone]

## 2014-08-08 NOTE — Progress Notes (Signed)
CSW [facility], and Mr. Oneal [facility] stated he would not be able to come to evaluate patient today, due to an emergency but could come tomorrow at 12pm.   Olga CoasterKristen Valery Amedee, Alexander MtLCSW [phone]  ED CSW 08/08/2014 832am

## 2014-08-09 NOTE — ED Notes (Signed)
Patient ambulating in hallways with use of walker, assisted by sitter Patient in NAD

## 2014-08-09 NOTE — Progress Notes (Signed)
Patient still pending placement.  [facility]  CSW referring patient to [facility] for possible placement.   Byrd HesselbachKristen Eulogio Requena, LCSW [phone]  ED CSW 08/09/2014 1150am

## 2014-08-09 NOTE — ED Notes (Signed)
Bed: [id number] Expected date:  Expected time:  Means of arrival:  Comments: For ed12

## 2014-08-10 NOTE — Progress Notes (Signed)
PHYSICAL THERAPY  RN paged me around 15:42 that was ready for PT to take him for a walk.  Arrived on unit 5 min later.  Pt in bed watching cartoons with sitter.  Despite several coaxing attemps, pt just repeated "I don't want to".  At one point he said "okay" and sat up to EOB but then laid back down and declined again.  I thank nursing for trying and ambulating pt.  Felecia ShellingLori Dexter Sauser  PTA [facility]  Acute  Rehab Pager      [phone]

## 2014-08-10 NOTE — ED Notes (Signed)
Pt asked to breath some fresh air and to sit outside for a bit.  Pt was walked to the ambulance bay and allowed to sit outside for 15 minutes.  Pt calm and cooperative.  Pt talkative and didn't use foul language.  It is this Nurse's suggestion that this activity be done regularly.

## 2014-08-10 NOTE — ED Notes (Signed)
rn to page Lattie CornsLori K, when pt is ready for PT  [phone]

## 2014-08-10 NOTE — Progress Notes (Signed)
PHYSICAL THERAPY   Per chart review pt is ambulating with sitter a great distance.  Pt resting on arrival to unit around 9 am.  Gave the nurse my pager # to call me when pt would like to walk again before 4pm.  Felecia ShellingLori Basim Bartnik  PTA [facility]  Acute  Rehab Pager      [phone]

## 2014-08-10 NOTE — ED Notes (Signed)
Pt continues to yell out obscenities.  Pt attempted to be redirected without a positive result.

## 2014-08-10 NOTE — Consult Note (Addendum)
[facility]   Reason for Consult:  Wandering Referring Physician:  EDP  Pablo Lawrenceykwan Reagle is an 33 y.o. male. Total Time spent with patient: 20 minutes  Assessment: AXIS I:  Cognitive Deficits, Traumatic Brain Injury AXIS II:  Deferred AXIS III:   Past Medical History  Diagnosis Date  . Depression     Pt was planning to start taking abilify 10/20  . Traumatic brain injury   . Seizures   . Bipolar 1 disorder    AXIS IV:  housing problems, other psychosocial or environmental problems, problems related to social environment and problems with primary support group AXIS V:  61-70 mild symptoms  Plan:  No evidence of imminent risk to self or others at present.   Patient does not meet criteria for psychiatric inpatient admission.  Subjective:   HPI:  Patient was walking around the ED with his sitter this am after eating breakfast.  He is much clearer and alert.  [name] smiled at me and responded well when I told him he was doing well. HPI Elements:   Location:  TBI. Quality:  Cognitive deficit. Severity:  Wandering. Timing:  1 day. Review of Systems  HENT: Negative for congestion.   Respiratory: Negative for cough, shortness of breath and wheezing.   Gastrointestinal: Negative for nausea, vomiting, abdominal pain, diarrhea and constipation.       Incont.  bowel   Genitourinary:       Incont. bladder   Neurological: Positive for seizures. Negative for dizziness, tremors and headaches.  Psychiatric/Behavioral: Negative for depression, suicidal ideas, hallucinations and memory loss. The patient is not nervous/anxious and does not have insomnia.   All other systems reviewed and are negative.   Past Psychiatric History: Past Medical History  Diagnosis Date  . Depression     Pt was planning to start taking abilify 10/20  . Traumatic brain injury   . Seizures   . Bipolar 1 disorder     reports that he has quit smoking. He does not have any smokeless  tobacco history on file. He reports that he does not drink alcohol or use illicit drugs. History reviewed. No pertinent family history.         Allergies:  No Known Allergies  ACT Assessment Complete:  Yes:    Educational Status    Risk to Self: Risk to self with the past 6 months Is patient at risk for suicide?: No Substance abuse history and/or treatment for substance abuse?: No  Risk to Others:    Abuse:    Prior Inpatient Therapy:    Prior Outpatient Therapy:    Additional Information:          Objective: Blood pressure 103/52, pulse 70, temperature 98.7 F (37.1 C), temperature source Oral, resp. rate 16, SpO2 98.00%.There is no weight on file to calculate BMI. No results found for this or any previous visit (from the past 72 hour(s)). Labs are reviewed see above values.  Medications reviewed and no changes made.  Current Facility-Administered Medications  Medication Dose Route Frequency Provider Last Rate Last Dose  . acetaminophen (TYLENOL) tablet 650 mg  650 mg Oral Q4H PRN [name], PA-C      . alum & mag hydroxide-simeth (MAALOX/MYLANTA) 200-200-20 MG/5ML suspension 30 mL  30 mL Oral PRN [name], PA-C      . divalproex (DEPAKOTE) DR tablet 500 mg  500 mg Oral Q12H [name], PA-C   500 mg at 08/10/14 1037  . escitalopram (  LEXAPRO) tablet 10 mg  10 mg Oral Daily [name], PA-C   10 mg at 08/10/14 1037  . ibuprofen (ADVIL,MOTRIN) tablet 600 mg  600 mg Oral Q8H PRN [name], PA-C      . LORazepam (ATIVAN) tablet 1 mg  1 mg Oral Q8H PRN [name], NP   1 mg at 08/10/14 0353  . nicotine (NICODERM CQ - dosed in mg/24 hours) patch 21 mg  21 mg Transdermal Daily [name], PA-C   21 mg at 08/10/14 1037  . ondansetron (ZOFRAN) tablet 4 mg  4 mg Oral Q8H PRN [name], PA-C      . ziprasidone (GEODON) capsule 20 mg  20 mg Oral BID WC [name], NP   20 mg at 08/10/14 [id number]   Current Outpatient Prescriptions  Medication Sig Dispense Refill  . acetaminophen  (TYLENOL) 325 MG tablet Take 650 mg by mouth every 6 (six) hours as needed for moderate pain.      . divalproex (DEPAKOTE) 500 MG DR tablet Take 1 tablet (500 mg total) by mouth every 12 (twelve) hours.  60 tablet  0  . escitalopram (LEXAPRO) 10 MG tablet Take 1 tablet (10 mg total) by mouth daily.  30 tablet  3  . ibuprofen (ADVIL,MOTRIN) 600 MG tablet Take 600 mg by mouth every 6 (six) hours as needed for moderate pain.      Marland Kitchen. LORazepam (ATIVAN) 1 MG tablet Take 1 tablet (1 mg total) by mouth every 8 (eight) hours as needed for anxiety (agitation).  30 tablet  0  . multivitamin-iron-minerals-folic acid (CENTRUM) chewable tablet Chew 1 tablet by mouth daily.      . QUEtiapine (SEROQUEL) 50 MG tablet Take 1 tablet (50 mg total) by mouth 4 (four) times daily.  150 tablet  0  . traZODone (DESYREL) 100 MG tablet Take 1 tablet (100 mg total) by mouth at bedtime.        Psychiatric Specialty Exam:     Blood pressure 103/52, pulse 70, temperature 98.7 F (37.1 C), temperature source Oral, resp. rate 16, SpO2 98.00%.There is no weight on file to calculate BMI.  General Appearance: Casual  Eye Contact::  Good  Speech:  Clear and Coherent and Slurred  Volume:  Normal  Mood:  Euthymic  Affect:  Congruent  Thought Process:  "I'm ready to go home"  Orientation:  Other:  Person and place  Thought Content:  "I don't like it here"  Suicidal Thoughts:  No  Homicidal Thoughts:  No  Memory:  Immediate;   Poor Recent;   Poor Remote;   Poor  Judgement:  Impaired  Insight:  Lacking  Psychomotor Activity:  Decreased  Concentration:  Poor  Recall:  Poor  Fund of Knowledge:Poor  Language: Poor  Akathisia:  No  Handed:  Right  AIMS (if indicated):     Assets:  Social Support  Sleep:      Musculoskeletal: Strength & Muscle Tone: within normal limits Gait & Station: unsteady, shuffle Patient leans: Front  Treatment Plan Summary: Patient's medications and labs reviewed, stable at this time from a  psychiatric perspective.  Nanine MeansLORD, JAMISON, PMH-NP 08/10/2014 3:11 PM    Patient seen, evaluated and I agree with notes by Nurse Practitioner. Thedore MinsMojeed Wauneta Silveria, MD

## 2014-08-10 NOTE — ED Notes (Signed)
Pt being ambulated around the department by sitter.

## 2014-08-10 NOTE — Progress Notes (Addendum)
Pt pending placement   [facility] [phone] pending evaluation on Monday at 10 am.  [facility] fl2 to [phone].   CSW called and left message at [facility] [phone]   Byrd HesselbachKristen Adaeze Better, LCSW [phone]  ED CSW 08/10/2014 1327pm

## 2014-08-10 NOTE — ED Notes (Signed)
Pt has become increasingly vocal and agitated.  Sitter moved to door.  Interactions limited.  Agitation continually increasing.

## 2014-08-10 NOTE — ED Notes (Signed)
rn dudley took pt to ambulate outside. Pt telling staff to shut up and die. Pt was still cooperative. wheelchaired to outside. rn dudley stated pt was to drowsy to walk very much. Brought back to room. pts scrubs being changed.

## 2014-08-10 NOTE — ED Notes (Signed)
PT paged for physical therapy

## 2014-08-11 NOTE — ED Notes (Signed)
Pt given a drink.

## 2014-08-11 NOTE — ED Notes (Addendum)
Pt resting quietly and watching tv. Pt requests a Malawiturkey sandwich and a soft drink which was provided. Pt denies pain and in NAD. Sitter at bedside

## 2014-08-11 NOTE — Consult Note (Signed)
[facility]   Reason for Consult:  Wandering Referring Physician:  EDP  Cory [name] is an 33 y.o. male. Total Time spent with patient: 20 minutes  Assessment: AXIS I:  Cognitive Deficits, Traumatic Brain Injury AXIS II:  Deferred AXIS III:   Past Medical History  Diagnosis Date  . Depression     Pt was planning to start taking abilify 10/20  . Traumatic brain injury   . Seizures   . Bipolar 1 disorder    AXIS IV:  housing problems, other psychosocial or environmental problems, problems related to social environment and problems with primary support group AXIS V:  61-70 mild symptoms  Plan:  No evidence of imminent risk to self or others at present.   Patient does not meet criteria for psychiatric inpatient admission. Dr. Jama Flavorsobos assessed the patient and concurs with the plan.  Subjective:   HPI:  Patient remains stable at this time.  He still uses profanity often but mood stable.  Cory [name] is eating and walking with some assistance.  He is at his baseline at this time. HPI Elements:   Location:  TBI. Quality:  Cognitive deficit. Severity:  Wandering. Timing:  1 day. Review of Systems  HENT: Negative for congestion.   Respiratory: Negative for cough, shortness of breath and wheezing.   Gastrointestinal: Negative for nausea, vomiting, abdominal pain, diarrhea and constipation.       Incont.  bowel   Genitourinary:       Incont. bladder   Neurological: Positive for seizures. Negative for dizziness, tremors and headaches.  Psychiatric/Behavioral: Negative for depression, suicidal ideas, hallucinations and memory loss. The patient is not nervous/anxious and does not have insomnia.   All other systems reviewed and are negative.   Past Psychiatric History: Past Medical History  Diagnosis Date  . Depression     Pt was planning to start taking abilify 10/20  . Traumatic brain injury   . Seizures   . Bipolar 1 disorder     reports that he has quit  smoking. He does not have any smokeless tobacco history on file. He reports that he does not drink alcohol or use illicit drugs. History reviewed. No pertinent family history.         Allergies:  No Known Allergies  ACT Assessment Complete:  Yes:    Educational Status    Risk to Self: Risk to self with the past 6 months Is patient at risk for suicide?: No Substance abuse history and/or treatment for substance abuse?: Yes  Risk to Others:    Abuse:    Prior Inpatient Therapy:    Prior Outpatient Therapy:    Additional Information:          Objective: Blood pressure 123/68, pulse 66, temperature 97.9 F (36.6 C), temperature source Oral, resp. rate 18, SpO2 97.00%.There is no weight on file to calculate BMI. No results found for this or any previous visit (from the past 72 hour(s)). Labs are reviewed see above values.  Medications reviewed and no changes made.  Current Facility-Administered Medications  Medication Dose Route Frequency Provider Last Rate Last Dose  . acetaminophen (TYLENOL) tablet 650 mg  650 mg Oral Q4H PRN [name], PA-C      . alum & mag hydroxide-simeth (MAALOX/MYLANTA) 200-200-20 MG/5ML suspension 30 mL  30 mL Oral PRN [name], PA-C      . divalproex (DEPAKOTE) DR tablet 500 mg  500 mg Oral Q12H [name], PA-C   500 mg at  08/11/14 0917  . escitalopram (LEXAPRO) tablet 10 mg  10 mg Oral Daily [name], PA-C   10 mg at 08/11/14 0917  . ibuprofen (ADVIL,MOTRIN) tablet 600 mg  600 mg Oral Q8H PRN [name], PA-C      . LORazepam (ATIVAN) tablet 1 mg  1 mg Oral Q8H PRN [name], NP   1 mg at 08/10/14 0353  . nicotine (NICODERM CQ - dosed in mg/24 hours) patch 21 mg  21 mg Transdermal Daily [name], PA-C   21 mg at 08/11/14 0918  . ondansetron (ZOFRAN) tablet 4 mg  4 mg Oral Q8H PRN [name], PA-C      . ziprasidone (GEODON) capsule 20 mg  20 mg Oral BID WC [name], NP   20 mg at 08/11/14 [id number]   Current Outpatient Prescriptions   Medication Sig Dispense Refill  . acetaminophen (TYLENOL) 325 MG tablet Take 650 mg by mouth every 6 (six) hours as needed for moderate pain.      . divalproex (DEPAKOTE) 500 MG DR tablet Take 1 tablet (500 mg total) by mouth every 12 (twelve) hours.  60 tablet  0  . escitalopram (LEXAPRO) 10 MG tablet Take 1 tablet (10 mg total) by mouth daily.  30 tablet  3  . ibuprofen (ADVIL,MOTRIN) 600 MG tablet Take 600 mg by mouth every 6 (six) hours as needed for moderate pain.      Marland Kitchen. LORazepam (ATIVAN) 1 MG tablet Take 1 tablet (1 mg total) by mouth every 8 (eight) hours as needed for anxiety (agitation).  30 tablet  0  . multivitamin-iron-minerals-folic acid (CENTRUM) chewable tablet Chew 1 tablet by mouth daily.      . QUEtiapine (SEROQUEL) 50 MG tablet Take 1 tablet (50 mg total) by mouth 4 (four) times daily.  150 tablet  0  . traZODone (DESYREL) 100 MG tablet Take 1 tablet (100 mg total) by mouth at bedtime.        Psychiatric Specialty Exam:     Blood pressure 123/68, pulse 66, temperature 97.9 F (36.6 C), temperature source Oral, resp. rate 18, SpO2 97.00%.There is no weight on file to calculate BMI.  General Appearance: Casual  Eye Contact::  Good  Speech:  Clear and Coherent and Slurred  Volume:  Normal  Mood:  Euthymic  Affect:  Congruent  Thought Process:  "I'm ready to go home"  Orientation:  Other:  Person and place  Thought Content:  "I don't like it here"  Suicidal Thoughts:  No  Homicidal Thoughts:  No  Memory:  Immediate;   Poor Recent;   Poor Remote;   Poor  Judgement:  Impaired  Insight:  Lacking  Psychomotor Activity:  Decreased  Concentration:  Poor  Recall:  Poor  Fund of Knowledge:Poor  Language: Poor  Akathisia:  No  Handed:  Right  AIMS (if indicated):     Assets:  Social Support  Sleep:      Musculoskeletal: Strength & Muscle Tone: within normal limits Gait & Station: unsteady, shuffle Patient leans: Front  Treatment Plan Summary: Patient's  medications and labs reviewed, stable at this time from a psychiatric perspective.  Nanine MeansLORD, JAMISON, PMH-NP 08/11/2014 9:22 AM  Patient seen and assessed with NP as above

## 2014-08-11 NOTE — ED Notes (Signed)
Pt was given a sandwich and a drink.

## 2014-08-11 NOTE — ED Notes (Addendum)
Pt is yelling loudly in room to sitter," Fuck You." Security called and outside room to monitor for behavior. Pt agitated and told to please be quiet but continues to be disruptive to other pts and visitors.

## 2014-08-12 NOTE — ED Notes (Signed)
Pt ate 50% of his dinner 

## 2014-08-12 NOTE — ED Notes (Signed)
Patient did not eat lunch, because he was asleep.

## 2014-08-12 NOTE — ED Notes (Signed)
Pt diaper change x2

## 2014-08-12 NOTE — ED Notes (Signed)
Patient is throwing pillows and spoons at sitter and verbally abusive. Sitter has informed patients nurse of his behavior.

## 2014-08-12 NOTE — ED Notes (Addendum)
Reported by Ophelia CharterSitter that pt. Has been throwing pillows and spoons and yelling at her. Pt. Received his PRN antianxiety/ agitation  Medicine at 0100 this morning, somehow ineffective. Charge Nurse made aware and was in pt.'s room . Pt. Reoriented, assured, safety measures applied, verbally redirected  . Kept monitored , environment kept secured.

## 2014-08-12 NOTE — ED Notes (Signed)
Patient ate 25% of his breakfast.

## 2014-08-13 MED ORDER — IBUPROFEN 200 MG PO TABS: 600.0000 mg | ORAL_TABLET | Freq: Three times a day (TID) | ORAL | Status: DC | PRN

## 2014-08-13 NOTE — Consult Note (Signed)
[facility]   Reason for Consult:  Wandering Referring Physician:  EDP  Pablo Lawrenceykwan Willets is an 33 y.o. male. Total Time spent with patient: 25 minutes  Assessment: AXIS I:  Cognitive Deficits, Traumatic Brain Injury AXIS II:  Deferred AXIS III:   Past Medical History  Diagnosis Date  . Depression     Pt was planning to start taking abilify 10/20  . Traumatic brain injury   . Seizures   . Bipolar 1 disorder    AXIS IV:  housing problems, other psychosocial or environmental problems, problems related to social environment and problems with primary support group AXIS V:  51-60 moderate symptoms  Plan:  Refer to group home for placement. Dr. Jannifer FranklinAkintayo concurs with this plan.   Subjective:   HPI:  Patient remains stable at this time.  He still uses profanity often but mood stable.  Delane Gingerkywan is eating and walking with some assistance.  He is at his baseline at this time but cannot care for himself independently.  HPI Elements:   Location:  TBI. Quality:  Cognitive deficit. Severity:  Wandering. Timing:  1 day. Review of Systems  HENT: Negative for congestion.   Respiratory: Negative for cough, shortness of breath and wheezing.   Gastrointestinal: Negative for nausea, vomiting, abdominal pain, diarrhea and constipation.       Incont.  bowel   Genitourinary:       Incont. bladder   Neurological: Positive for seizures. Negative for dizziness, tremors and headaches.  Psychiatric/Behavioral: Negative for depression, suicidal ideas, hallucinations and memory loss. The patient is not nervous/anxious and does not have insomnia.   All other systems reviewed and are negative.   Past Psychiatric History: Past Medical History  Diagnosis Date  . Depression     Pt was planning to start taking abilify 10/20  . Traumatic brain injury   . Seizures   . Bipolar 1 disorder     reports that he has quit smoking. He does not have any smokeless tobacco history on file. He  reports that he does not drink alcohol or use illicit drugs. History reviewed. No pertinent family history.         Allergies:  No Known Allergies  ACT Assessment Complete:  Yes:    Educational Status    Risk to Self: Risk to self with the past 6 months Is patient at risk for suicide?: No Substance abuse history and/or treatment for substance abuse?: No  Risk to Others:    Abuse:    Prior Inpatient Therapy:    Prior Outpatient Therapy:    Additional Information:          Objective: Blood pressure 103/59, pulse 79, temperature 98.3 F (36.8 C), temperature source Oral, resp. rate 16, SpO2 99.00%.There is no weight on file to calculate BMI. No results found for this or any previous visit (from the past 72 hour(s)). Labs are reviewed see above values.  Medications reviewed and no changes made.  Current Facility-Administered Medications  Medication Dose Route Frequency Provider Last Rate Last Dose  . acetaminophen (TYLENOL) tablet 650 mg  650 mg Oral Q4H PRN [name], PA-C      . alum & mag hydroxide-simeth (MAALOX/MYLANTA) 200-200-20 MG/5ML suspension 30 mL  30 mL Oral PRN [name], PA-C      . divalproex (DEPAKOTE) DR tablet 500 mg  500 mg Oral Q12H [name], PA-C   500 mg at 08/13/14 1015  . escitalopram (LEXAPRO) tablet 10 mg  10  mg Oral Daily [name], PA-C   10 mg at 08/13/14 1016  . ibuprofen (ADVIL,MOTRIN) tablet 600 mg  600 mg Oral Q8H PRN [name], NP      . LORazepam (ATIVAN) tablet 1 mg  1 mg Oral Q8H PRN [name], NP   1 mg at 08/13/14 0217  . nicotine (NICODERM CQ - dosed in mg/24 hours) patch 21 mg  21 mg Transdermal Daily [name], PA-C   21 mg at 08/13/14 1015  . ondansetron (ZOFRAN) tablet 4 mg  4 mg Oral Q8H PRN [name], PA-C      . ziprasidone (GEODON) capsule 20 mg  20 mg Oral BID WC [name], NP   20 mg at 08/13/14 [id number]   Current Outpatient Prescriptions  Medication Sig Dispense Refill  . acetaminophen (TYLENOL) 325 MG tablet Take  650 mg by mouth every 6 (six) hours as needed for moderate pain.      . divalproex (DEPAKOTE) 500 MG DR tablet Take 1 tablet (500 mg total) by mouth every 12 (twelve) hours.  60 tablet  0  . escitalopram (LEXAPRO) 10 MG tablet Take 1 tablet (10 mg total) by mouth daily.  30 tablet  3  . ibuprofen (ADVIL,MOTRIN) 600 MG tablet Take 600 mg by mouth every 6 (six) hours as needed for moderate pain.      Marland Kitchen. LORazepam (ATIVAN) 1 MG tablet Take 1 tablet (1 mg total) by mouth every 8 (eight) hours as needed for anxiety (agitation).  30 tablet  0  . multivitamin-iron-minerals-folic acid (CENTRUM) chewable tablet Chew 1 tablet by mouth daily.      . QUEtiapine (SEROQUEL) 50 MG tablet Take 1 tablet (50 mg total) by mouth 4 (four) times daily.  150 tablet  0  . traZODone (DESYREL) 100 MG tablet Take 1 tablet (100 mg total) by mouth at bedtime.        Psychiatric Specialty Exam:     Blood pressure 103/59, pulse 79, temperature 98.3 F (36.8 C), temperature source Oral, resp. rate 16, SpO2 99.00%.There is no weight on file to calculate BMI.  General Appearance: Casual  Eye Contact::  Good  Speech:  Clear and Coherent and Slurred  Volume:  Normal  Mood:  Euthymic  Affect:  Congruent  Thought Process:  "I'm Ok with the group home"  Orientation:  Other:  Person and place  Thought Content:  Goal Directed  Suicidal Thoughts:  No  Homicidal Thoughts:  No  Memory:  Immediate;   Poor Recent;   Poor Remote;   Poor  Judgement:  Impaired  Insight:  Lacking  Psychomotor Activity:  Decreased  Concentration:  Poor  Recall:  Poor  Fund of Knowledge:Poor  Language: Poor  Akathisia:  No  Handed:  Right  AIMS (if indicated):     Assets:  Social Support  Sleep:      Musculoskeletal: Strength & Muscle Tone: within normal limits Gait & Station: unsteady, shuffle Patient leans: Front  Treatment Plan Summary: -Discharge to group home. [facility] TTS staff to place pt in group home due to cognitive deficits and  inability to care for self.   Beau FannyWithrow, John C, FNP-BC 08/13/2014 11:36 AM    Patient seen, evaluated and I agree with notes by Nurse Practitioner. Thedore MinsMojeed Chealsea Paske, MD

## 2014-08-13 NOTE — ED Notes (Signed)
I walked pt around the ED unit 2x pt seem to be very weak since I was with him on past Fri.

## 2014-08-13 NOTE — ED Notes (Signed)
Walked pt around ED unit 3x pt walking slowly improving with walker ast.

## 2014-08-13 NOTE — Progress Notes (Addendum)
CSW spoke with [name] [phone]) at [facility] who stated that there is only one room available and the patient would have to be able to do stairs independently without any difficulty. Per chart review, physical therapy evaluations, and observation patient gait is abnormal, needs minimal assistance, and sometimes a Rolling walker on flat surfaces. At this time, it is not advised for patient to use stairs on a consistent basis.   CSW received resource of [employer] [place].   [facility] that patient to be evaluated this morning before 12pm noon.   CSW left message with [facility] at [phone]  CSW spok ewith [employer], who stated there program was a day program and independent living and pt is not a candidate for them.  [facility] with [name] at [phone] and no beds available.  CSW spoke with [name] who referred CSW to DaleMichelle from [facility]. Unfortunately services are not covered by medicaid. Per [name], the evaluator can call CSW to provide possible residential options [name] [phone].  CSW called TBI apartment program [phone] and was directed to Director of [employer] [facility] Shelia at [phone], unfortunately no openings available at group home   CSW called [facility], patient not elligibile due to lack of innovation funding.  CSW called [facility], Director Jamelle HaringSnow stated patient is not elligible due to not accepting medicaid.  [facility] sp[facility]with [facility], and patient unelligible as patient unable to be unsupervised from 9-4pm daily.   Byrd HesselbachKristen Lemonte Al, LCSW [phone]  ED CSW 08/13/2014 943am     Addendum 11:36 am  [facility]. Per owner, patient must be able to independently navigate to day program on the city bus, and be able to have short periods of time unsupervised.     Addendum 1350pm  CS[facility] with [facility], who previously worked with patient  who states that patient is not elligible for innovation funding because patient TBI happened after the age of 33.    Byrd HesselbachKristen Reymond Maynez, LCSW [phone]  ED CSW 08/13/2014 1350pm   Addendum:  CSW spoke with Ms. [place], [phone] from [facility], regarding placement. Ms. [name] is interested however pt would be going to a day program, with transportation provided. Ms. [name] concern is patient incontinence during the time he is at the day program. Ms. [name] plans to discuss patient needs with day program, [facility]. CSW to fax clinical information and fl2 to Ms. [name] [phone].   Byrd HesselbachKristen Alfard Cochrane, LCSW [phone]  ED CSW 08/13/2014 1421pm

## 2014-08-13 NOTE — ED Notes (Signed)
Walked pt around unit 2x and went outside for 20 mins for sunshine Vit. D and fresh air.

## 2014-08-13 NOTE — ED Notes (Addendum)
MD at bedside. TTS 

## 2014-08-13 NOTE — ED Notes (Signed)
Holding scheduled Geodon. Pt did not eat lunch due to sedation and been asleep since  1130 Am. Will notify next shift RN for reevaluation.

## 2014-08-13 NOTE — ED Notes (Signed)
Pt woke up after 7 hrs of sleep I walked pt 2x around ED unit with walker ast.

## 2014-08-13 NOTE — Progress Notes (Signed)
PT Cancellation Note  Patient Details Name: Cory [name] MRN: [id number] DOB: 1981/10/05   Cancelled Treatment:     patient observed ambulating with CNA, not using RW, appears to have min guard to min assist, gait is abnormal. Discussed with RN, role of PT for this patient. Recommended  That nursing staff ambulate with RW as needed , depending on patient's participation and capability. Will discuss further with MSW role of PT for this patient in this venue.   Rada HayHill, Armentha Branagan Elizabeth 08/13/2014, 8:48 AM Blanchard KelchKaren Caillou Minus PT [phone]

## 2014-08-14 NOTE — ED Notes (Signed)
Pt. Ate 10% of his dinner tray.

## 2014-08-14 NOTE — ED Notes (Signed)
Upon entering room for shift change pt started using vulgar language toward this sitter. Then pt started throwing pillows at sitter. Both pillows were removed from out of pts reach.

## 2014-08-14 NOTE — ED Notes (Signed)
Pt ate 100% of his lunch tray

## 2014-08-14 NOTE — Progress Notes (Addendum)
[facility], patient to be evaluated tomorrow by Joselyn GlassmanKelly [facility] [phone].  [facility] pending reivew. [phone] [facility]. [phone].   [facility] at [facility], csw to follow up after 1pm today.    Byrd HesselbachKristen Kathrina Crosley, LCSW [phone]  ED CSW 08/14/2014 948am    Addendum: [name] APS worker [phone] to be coming to evaluate patient and assist with placement. She is currently working with family to encouraging other family members to becoming guardian or pursue guardianship by dss.  CSW called and left message for Vaughan Sinehedosia Williams, patient [facility] cooridnator [phone].   Byrd HesselbachKristen Myrick Mcnairy, LCSW [phone]  ED CSW 08/14/2014 1221pm

## 2014-08-14 NOTE — ED Notes (Addendum)
Patient walked around ED station x2 with walker. Sitter by his side. Patient had several breaks in between laps with complaints of being tired. Had moments where language was vulgar but redirected.

## 2014-08-14 NOTE — ED Notes (Signed)
Patient ate 0% of breakfast tray.

## 2014-08-14 NOTE — ED Notes (Signed)
Pt. Ate 100% of a Malawiturkey sandwich and soda.

## 2014-08-14 NOTE — ED Notes (Signed)
Pt had TB test read by Coralee Rududley, RN 48 hours after TB test administered. Result was negative. Documentation with other paperwork.

## 2014-08-14 NOTE — Consult Note (Signed)
  Cory [name] was sleeping this am when I checked on him.  He has been sleeping during the day so the morning dose of Geodon might be switched to the evening as his behavior has been more appropriate for him in that he seems happier, swears less, somewhat more easily redirected.Marland Kitchen.  He remains unable to process even simple situations, very limited in how he interacts, no verbal skills at all to maneuver daily situations, requires supervision to follow directions.  He has responded to consistency as much as it can be provided in the ED.  He seems to have responded to medications.  He definitely lacks capacity to make decisions for himself.  Current medications seem appropriate.  He needs a group home to help him maintain his behavior in a safe way.

## 2014-08-14 NOTE — ED Notes (Signed)
Sitter and NT changed pt incontinent of bowel/bladder. Pt using vulgar language towards staff.

## 2014-08-15 NOTE — ED Notes (Signed)
Took pt for a walk around unit 2x with walker ast.

## 2014-08-15 NOTE — ED Notes (Signed)
Pt ambulating in hall with sitter. Pt tolerating well.

## 2014-08-15 NOTE — ED Notes (Signed)
Pt resting quietly and sleeping with equal rise and fall of chest. Sitter at bedside.

## 2014-08-15 NOTE — ED Notes (Signed)
As I enter our unit I observe our sitter with pt. Who is ambulating in our hallway with a walker. His sitter is in constant attendance. His skin is normal, warm and dry and he is breathing normally.

## 2014-08-15 NOTE — Progress Notes (Signed)
Pt still pending placement at a group home.  Pt not elligible for innovation funding due to TBI taking place after age of 33 years old.  Pt mother not able to continue to provide care for patient per APS due to her own needs.  [facility]  [facility]. CSW awaiting to hear what time.  Pt to be followed by physical therapy.  Pt being followed by psychiatry for medication management. Byrd HesselbachKristen Kazuto Sevey, LCSW [phone]  ED CSW 08/15/2014 1224pm

## 2014-08-15 NOTE — ED Notes (Signed)
Pt is ambulating at this time with sitter assistance

## 2014-08-15 NOTE — ED Notes (Signed)
Pt has been awake since 2200. Using vulgar language and banging on the bed.

## 2014-08-15 NOTE — Consult Note (Signed)
  Cory [name] remains calm today.  He seems to be more awake.  He was appropriate in saying "hello" rather than "get out of here".  Will meet with another group home today for consideration for placement.

## 2014-08-15 NOTE — ED Notes (Signed)
Sitter and NT changed pt. Pt continues to use vulgar language towards staff

## 2014-08-15 NOTE — ED Notes (Signed)
I walked pt 3x around ED uni with walker ast.

## 2014-08-15 NOTE — ED Notes (Signed)
I walked pt 2x around ED unit. Pt has improved since I had him 2 days ago. Pt still using walker for ast.

## 2014-08-15 NOTE — ED Notes (Signed)
Pt continues to yell using vulgar language and threatening this sitter.

## 2014-08-16 NOTE — Progress Notes (Signed)
Pt still pending placement.  [facility] at 930[facility].  Pt under review at [facility]  [facility].   Pt APS Work [name] still actively working with patient.  Pt care coordinator Vaughan Sinehedosia Williams [phone] still actively working with patient.  Pt brothers still willing to sign patient in, however unable to be caregiver for patients.  Pt mother still unable to resume care of patient per recommendations of APS.   Byrd HesselbachKristen Shilah Hefel, LCSW [phone]  ED CSW 08/16/2014 858am

## 2014-08-16 NOTE — ED Notes (Signed)
Since assuming care of Pt at 0300, Pt has been given several cups of water upon request. Pt's brief was changed, peri care was performed, and an entire linen change was done. The bed was also disinfected.   There has been a Recruitment consultantsafety sitter at the bedside for the entirety of this time. Sitters were offered breaks and both Pt and sitter were checked on frequently.

## 2014-08-16 NOTE — ED Notes (Signed)
Sitter at bedside.

## 2014-08-16 NOTE — ED Notes (Signed)
I walked pt 2x around ED unit, with some ast with walker some without.

## 2014-08-16 NOTE — ED Notes (Signed)
Took pt to the bathroom then walked 2x around ED unit. Pt did well using walker half of the time.

## 2014-08-16 NOTE — ED Notes (Signed)
Took pt to bathroom then walked pt 1x around Ed unit. Pt seems to be getting better every time walked. Used walker most of the walk.

## 2014-08-16 NOTE — ED Notes (Signed)
Patient ambulated in hall with sitter

## 2014-08-16 NOTE — ED Notes (Signed)
Pt has been(for the large part of the evening) seemingly agitated more than usual, constantly using profanities, threatening to hit staff and refusing to remain calm in his room. Pt also states he wants to go home. Only ate his dinner partially. Pt has been medicated including his PRN medication for agitation(please see MAR) without notable change.  Of note, adjacently roomed pt's are expressing their displeasure at having to hear pt loud profanities. Charge nurse notified.

## 2014-08-16 NOTE — ED Notes (Signed)
Patient resting in position of comfort with eyes closed RR WNL--even and unlabored with equal rise and fall of chest Patient in NAD Side rails up and sitter remains at bedside

## 2014-08-17 MED ORDER — ZIPRASIDONE HCL 20 MG PO CAPS
20.0000 mg | ORAL_CAPSULE | Freq: Every day | ORAL | Status: DC
  Administered 2014-08-18 – 2014-08-19 (×2): 20 mg via ORAL
  Filled 2014-08-17 (×2): qty 1

## 2014-08-17 NOTE — ED Notes (Signed)
Pt ambulating around dept using walker at this time accompanied by sitter. Pt is ambulating w/o difficulty and in NAD. Will continue to monitor

## 2014-08-17 NOTE — ED Notes (Signed)
I walked pt 2x around ED unit. Hadn't walked since yesterday when I walked him. Pt still using walker for ast.

## 2014-08-17 NOTE — ED Notes (Signed)
I got Pt a recliner to sit in instead of the bed seem to be doing good with sitting in the recliner.

## 2014-08-17 NOTE — ED Notes (Signed)
While changing patient and bed linens with the assistance of 2 staff members patient is very agitated, using profanity, attempting to hit staff and spitting at staff.

## 2014-08-17 NOTE — ED Notes (Signed)
Pt back to bed.

## 2014-08-17 NOTE — ED Notes (Signed)
Pt attempted to strike this RN several times during med admin. Pt was advised that that he cannot hit and to stop acting out.

## 2014-08-17 NOTE — Progress Notes (Signed)
08/17/14 1429 ED CM consulted by sitter, Kizzy, who generally sits with pt.  She voiced concern that pt noted to "have his days and nights reversed" She discussed wanting to get pt walking more during the day but noted pt has been sleeping more. Cm reviewed pt's MAR to find pt with Geodon 20 mg with bid with meals. CM reviewed MAR report to also note use of ativan prn dose generally between 2300-0700.  ED CM [facility] with [facility] NP/PA, Catha NottinghamJamison about voiced concerns with concern to get pt mobile during day and on a better sleeping schedule.  Note order changes. ED RN, Darl PikesSusan updated

## 2014-08-17 NOTE — ED Notes (Signed)
Pt yelling. Staying in bed

## 2014-08-17 NOTE — ED Notes (Signed)
PT Cory [name], sts that she is releasing pt because she believes that pt is unable to retain new information. She sts that she will put all notes in chart and will also talk to SW

## 2014-08-17 NOTE — ED Notes (Signed)
Sitter walking pt in hallway with standby assist using walker. Pt is calm and cooperative. Pt in NAD

## 2014-08-17 NOTE — ED Notes (Signed)
Sitter continues to be present at the bedside. Pt does not appear to be in any acute distress. Pt appears to be at baseline.

## 2014-08-17 NOTE — ED Notes (Signed)
Pt back asleep.

## 2014-08-17 NOTE — Progress Notes (Signed)
PT Cancellation Note  Patient Details Name: Cory [name] MRN: [id number] DOB: [birth year]   Cancelled Treatment:    Reason Eval/Treat Not Completed: Other (comment) After chart review, speaking with evaluating and treatment therapists, as well as nursing staff and CNA, pt has not further skilled PT needs due to he is unable to retain  or learn new strategies, his mood and our timing depends on his ability, and he has been ambulating with a RW with minimal to supervision levels with staff and tolerating well. PT to sign off at this time and nursing aware.    Marella BileBRITT, [name] 08/17/2014, 12:04 PM Marella BileSharron Desean Heemstra, PT Pager: [phone] 08/17/2014

## 2014-08-18 NOTE — ED Notes (Signed)
Pt's belongings placed in locker #30

## 2014-08-18 NOTE — ED Notes (Signed)
Pt yelling at sitter, "Shut up. Leave me alone." Pt denies pain and tells RN to leave him alone. Pt in NAD. Will monitor

## 2014-08-18 NOTE — ED Notes (Signed)
Pt's care assumed at 0800. Pt is cursing at staff, yelling "fuck you all, leave me alone". Pt is also attempting to inappropriately touch sitter. Pt told to behave himself and that is unacceptable to touch staff. Will continue to reorient.

## 2014-08-18 NOTE — ED Provider Notes (Signed)
Filed Vitals:   08/17/14 2302  BP: 110/67  Pulse: 75  Temp: 98.5 F (36.9 C)  Resp: 18   Pt laying in bed, greets me with "fuck you".  Waiting for breakfast.  No specific complaints.  Still awaiting placement in group home.  Rolan BuccoMelanie Mikhayla Phillis, MD 08/18/14 [phone]

## 2014-08-18 NOTE — ED Notes (Signed)
Patient continues to yell obscenities to nurse.  Patient balling up his fist and saying, "I'll hit you in your face."  Sitter at bedside and seems to have developed a trusting relationship with patient.  He is allowing her to assist him without threats.

## 2014-08-19 ENCOUNTER — Encounter (HOSPITAL_COMMUNITY): Payer: Self-pay | Admitting: Registered Nurse

## 2014-08-19 MED ORDER — HALOPERIDOL LACTATE 5 MG/ML IJ SOLN
INTRAMUSCULAR | Status: AC
  Administered 2014-08-19: 5 mg via INTRAMUSCULAR
  Filled 2014-08-19: qty 1

## 2014-08-19 MED ORDER — LORAZEPAM 2 MG/ML IJ SOLN
2.0000 mg | Freq: Once | INTRAMUSCULAR | Status: AC
  Administered 2014-08-19: 2 mg via INTRAMUSCULAR

## 2014-08-19 MED ORDER — HALOPERIDOL LACTATE 5 MG/ML IJ SOLN
5.0000 mg | Freq: Once | INTRAMUSCULAR | Status: AC
  Administered 2014-08-19: 5 mg via INTRAMUSCULAR

## 2014-08-19 MED ORDER — LORAZEPAM 2 MG/ML IJ SOLN
INTRAMUSCULAR | Status: AC
  Administered 2014-08-19: 2 mg via INTRAMUSCULAR
  Filled 2014-08-19: qty 1

## 2014-08-19 MED ORDER — DIPHENHYDRAMINE HCL 50 MG/ML IJ SOLN
50.0000 mg | Freq: Once | INTRAMUSCULAR | Status: AC
  Administered 2014-08-19: 50 mg via INTRAMUSCULAR

## 2014-08-19 MED ORDER — NICOTINE POLACRILEX 2 MG MT GUM
2.0000 mg | CHEWING_GUM | OROMUCOSAL | Status: DC | PRN
  Administered 2014-08-19 – 2014-10-04 (×2): 2 mg via ORAL
  Filled 2014-08-19 (×3): qty 1

## 2014-08-19 MED ORDER — DIPHENHYDRAMINE HCL 50 MG/ML IJ SOLN
INTRAMUSCULAR | Status: AC
  Administered 2014-08-19: 50 mg via INTRAMUSCULAR
  Filled 2014-08-19: qty 1

## 2014-08-19 NOTE — Consult Note (Signed)
[facility]   Reason for Consult:  Wandering Referring Physician:  EDP  Cory [name] is an 33 y.o. male. Total Time spent with patient: 25 minutes  Assessment: AXIS I:  Cognitive Deficits, Traumatic Brain Injury AXIS II:  Deferred AXIS III:   Past Medical History  Diagnosis Date  . Depression     Pt was planning to start taking abilify 10/20  . Traumatic brain injury   . Seizures   . Bipolar 1 disorder    AXIS IV:  housing problems, other psychosocial or environmental problems, problems related to social environment and problems with primary support group AXIS V:  51-60 moderate symptoms  Plan:  Group Home placement  Subjective:   HPI: Patient has been cleared psychiatrically.  Stopped to check on patient and was informed that patient had been agitated, not wanting to stay in his room, cursing, and asking for [employer]izzy Tax adviser(nurse tech) who he has bonded with since his stay here in emergency room.  Discussed with stay to try other behavior interventions and to minimize any injections if possible.  Discussed ways to deescalate patient such as patient likes to sit in the door way of his room, daily routine, if cursing or yelling at staff have patient sit in his room out of door way no greater than 10 minutes.  Informed patient usually does well when redirected.  Patient having to get use to new faces/people caring for him today.  Understanding voiced.  Patient continues to sleep at this time and unable to speak with patient.      HPI Elements:   Location:  TBI. Quality:  Cognitive deficit. Severity:  Wandering. Timing:  1 day. Review of Systems  Unable to perform ROS: other  Patient sleeping at this time unable to do ROS  Past Psychiatric History: Past Medical History  Diagnosis Date  . Depression     Pt was planning to start taking abilify 10/20  . Traumatic brain injury   . Seizures   . Bipolar 1 disorder     reports that he has quit smoking. He does  not have any smokeless tobacco history on file. He reports that he does not drink alcohol or use illicit drugs. History reviewed. No pertinent family history.         Allergies:  No Known Allergies     Objective: Blood pressure 120/73, pulse 94, temperature 98.1 F (36.7 C), temperature source Tympanic, resp. rate 16, SpO2 100.00%.There is no weight on file to calculate BMI. No results found for this or any previous visit (from the past 72 hour(s)). Labs are reviewed see above values.  Medications reviewed and no changes made.  Current Facility-Administered Medications  Medication Dose Route Frequency Provider Last Rate Last Dose  . acetaminophen (TYLENOL) tablet 650 mg  650 mg Oral Q4H PRN [name], PA-C      . alum & mag hydroxide-simeth (MAALOX/MYLANTA) 200-200-20 MG/5ML suspension 30 mL  30 mL Oral PRN [name], PA-C      . divalproex (DEPAKOTE) DR tablet 500 mg  500 mg Oral Q12H [name], PA-C   500 mg at 08/18/14 2235  . escitalopram (LEXAPRO) tablet 10 mg  10 mg Oral Daily [name], PA-C   10 mg at 08/18/14 1136  . ibuprofen (ADVIL,MOTRIN) tablet 600 mg  600 mg Oral Q8H PRN [name], NP      . nicotine (NICODERM CQ - dosed in mg/24 hours) patch 21 mg  21 mg  Transdermal Daily [name], PA-C   21 mg at 08/19/14 0745  . nicotine polacrilex (NICORETTE) gum 2 mg  2 mg Oral PRN [name], PA-C   2 mg at 08/19/14 0535  . ondansetron (ZOFRAN) tablet 4 mg  4 mg Oral Q8H PRN [name], PA-C      . ziprasidone (GEODON) capsule 20 mg  20 mg Oral Daily [name], NP   20 mg at 08/18/14 1711   Current Outpatient Prescriptions  Medication Sig Dispense Refill  . acetaminophen (TYLENOL) 325 MG tablet Take 650 mg by mouth every 6 (six) hours as needed for moderate pain.      . divalproex (DEPAKOTE) 500 MG DR tablet Take 1 tablet (500 mg total) by mouth every 12 (twelve) hours.  60 tablet  0  . escitalopram (LEXAPRO) 10 MG tablet Take 1 tablet (10 mg total) by mouth  daily.  30 tablet  3  . ibuprofen (ADVIL,MOTRIN) 600 MG tablet Take 600 mg by mouth every 6 (six) hours as needed for moderate pain.      Marland Kitchen. LORazepam (ATIVAN) 1 MG tablet Take 1 tablet (1 mg total) by mouth every 8 (eight) hours as needed for anxiety (agitation).  30 tablet  0  . multivitamin-iron-minerals-folic acid (CENTRUM) chewable tablet Chew 1 tablet by mouth daily.      . QUEtiapine (SEROQUEL) 50 MG tablet Take 1 tablet (50 mg total) by mouth 4 (four) times daily.  150 tablet  0  . traZODone (DESYREL) 100 MG tablet Take 1 tablet (100 mg total) by mouth at bedtime.       Treatment Plan Summary: Social Work placement into a group home.  Patient remains stable at this time and continues to wait on placement into a group home.     Rankin, [name], FNP-BC 08/19/2014 11:46 AM  I have personally seen the patient and agreed with the findings and involved in the treatment plan. Kathryne SharperSyed Kentrell Guettler, MD

## 2014-08-19 NOTE — ED Notes (Signed)
Pt is in this facility involuntarily. He has been agitated, anxious and labile this am. He has been unable to follow directions and has been unsafe. He has an unsteady gait and is unaware of environmental dangers. RN obtained an order for ativan, benadryl and haldol per dr MD. It was administered and the pt is resting comfortable in his bed. He has no voiced any complaints and has no made any stmts regarding si/hi/av. RN will continues to monitor and pt remains on a 1:1.

## 2014-08-20 MED ORDER — ZIPRASIDONE HCL 20 MG PO CAPS
20.0000 mg | ORAL_CAPSULE | Freq: Two times a day (BID) | ORAL | Status: DC
  Administered 2014-08-20 – 2014-08-24 (×6): 20 mg via ORAL
  Filled 2014-08-20 (×6): qty 1

## 2014-08-20 NOTE — Progress Notes (Addendum)
Pt evaluated by Joselyn GlassmanKelly Bennet, for [facility]. Due to patient only staying "Go away, F** you, I'm good, I dont need anyone" [facility]. Per Mr. Wardell HeathBennet, due to patient being unwilling to even have conversation, he does not feel that patient would be willing to work with staff or reside with other residents. At this time, Mr. Willeen CassBennett feels that with this presentation, it would be difficult finding a [facility] that would be able to work with patient and help patient to fit into group home lifestyle and expectations.    CSW continuing to work on placement.   [facility]   [facility], a home away from home, [facility], [facility], [address], [facility].  Byrd HesselbachKristen Charde Macfarlane, LCSW [phone]  ED CSW 08/20/2014 836am     CSW updated Vaughan Sinehedosia Williams, pt care coordinator [phone]. She [facility] [place]. CSW to contact. Pt care cooridnator to assit with placement and to call back with updates before 10 am.   Byrd HesselbachKristen Briannia Laba, LCSW [phone]  ED CSW 08/20/2014

## 2014-08-20 NOTE — Progress Notes (Signed)
Pt evaluated by Joselyn GlassmanKelly Bennet, for [facility]. Due to patient only staying "Go away, F** you, I'm good, I dont need anyone" Patient [facility] a [facility]. Per Mr. Wardell HeathBennet, due to patient being unwilling to even have conversation, he does not feel that patient would be willing to work with staff or reside with other residents. At this time, Mr. Willeen CassBennett feels that with this presentation, it would be difficult finding a [facility] that would be able to work with patient and help patient to fit into group home lifestyle and expectations.    CSW continuing to work on placement.   Pt fl[facility]   Pt [facility] from [facility], a home away from home, [facility], [facility], [address] [facility], [facility].  Byrd HesselbachKristen Prateek Knipple, LCSW [phone]  ED CSW 08/20/2014 836am     CSW updated Vaughan Sinehedosia Williams, pt care coordinator [phone]. She [facility] [place]. CSW to contact. Pt care cooridnator to assit with placement and to call back with updates before 10 am.   Byrd HesselbachKristen Alaira Level, LCSW [phone]  ED CSW 08/20/2014   CSW spoke with [name], patient adult protective services social worker who is petitioning for guardianship.   Olga CoasterKristen Adonay Scheier, [place]CS[place] [phone]  ED CSW

## 2014-08-21 MED ORDER — ESCITALOPRAM OXALATE 10 MG PO TABS
ORAL_TABLET | ORAL | Status: AC
  Administered 2014-08-21: 10 mg
  Filled 2014-08-21: qty 1

## 2014-08-21 MED ORDER — DIVALPROEX SODIUM 500 MG PO DR TAB
DELAYED_RELEASE_TABLET | ORAL | Status: AC
  Filled 2014-08-21: qty 1

## 2014-08-21 NOTE — ED Notes (Signed)
Pt back to bed.

## 2014-08-21 NOTE — ED Notes (Addendum)
I walked pt 3x around TCU unit pt did well with little ast.with walker; pt now in chair.

## 2014-08-21 NOTE — ED Notes (Signed)
Pt cussing at RN while administrating medications to pt, pt redirected by NT/sitter, pt took medications.

## 2014-08-21 NOTE — ED Notes (Addendum)
Pt giving a ham sandwich and a coke

## 2014-08-21 NOTE — Progress Notes (Signed)
CSW spoke with patient care coordinator Vaughan Sinehedosia Williams at [phone] who shared that she still did not have any residential options for patient. Patient continues to need residential placement. Patient continues to be verbally abusive verbal hyper sexuality, incontinent, needing assistance with walker with walking, and has times of threatening aggressive behavior with balling up fists. Patient is able to be reidrected, at times needs medications to help calm patient down. However patient medications have been adjusted by psychiatry to help stabilize patient.   [place] group homes. CSW informed care cooridnator who is aware. CSW called care coordiantor supervisor Ashely lucas at [phone] and left message. Pt discussed at Long length of stay. Hospital administrators wanting to discuss with [facility] to help facilitate patient disposition.   Byrd HesselbachKristen Zakhari Fogel, LCSW [phone]  ED CSW 08/21/2014 1141am

## 2014-08-22 MED ORDER — ZIPRASIDONE HCL 20 MG PO CAPS
ORAL_CAPSULE | ORAL | Status: AC
  Administered 2014-08-22: 17:00:00
  Filled 2014-08-22: qty 1

## 2014-08-22 MED ORDER — ZIPRASIDONE MESYLATE 20 MG IM SOLR
10.0000 mg | Freq: Once | INTRAMUSCULAR | Status: AC
  Administered 2014-08-22: 10 mg via INTRAMUSCULAR
  Filled 2014-08-22: qty 20

## 2014-08-22 MED ORDER — ZIPRASIDONE MESYLATE 20 MG IM SOLR
INTRAMUSCULAR | Status: AC
  Filled 2014-08-22: qty 20

## 2014-08-22 MED ORDER — ZIPRASIDONE MESYLATE 20 MG IM SOLR
10.0000 mg | Freq: Once | INTRAMUSCULAR | Status: AC
  Administered 2014-08-22: 10 mg via INTRAMUSCULAR

## 2014-08-22 NOTE — Consult Note (Signed)
Face to face evaluation and I agree with this note 

## 2014-08-22 NOTE — ED Notes (Signed)
Pt given Depakote after much encouragement. Pt then began to curse staff again and yell loudly on the unit.

## 2014-08-22 NOTE — ED Notes (Signed)
Patient awake, began to yell and curse staff and demanding others to leave. Pt unable to be redirected at this time. Pt labile and threatening others. MD aware, new orders placed.

## 2014-08-22 NOTE — ED Notes (Signed)
Pt giving cracker and chesse

## 2014-08-22 NOTE — ED Notes (Signed)
Patient with large, loose incontinent stool, security in for assistance as staff changed pt and linens. Patient screaming and attempting to punch staff during care.

## 2014-08-22 NOTE — ED Notes (Signed)
Patient currently asleep; no s/s of distress noted. Respirations regular and unlabored. Skin warm and dry. Sitter at bedside for safety.

## 2014-08-22 NOTE — ED Notes (Signed)
Sitter said she will get his vital when the patient wakes up

## 2014-08-22 NOTE — ED Notes (Signed)
Patient resting with eyes closed; no s/s of distress noted. Medication effective.

## 2014-08-22 NOTE — ED Notes (Signed)
Staff attempting to redirect patient after he continually curses and screams "Fuck you bitch! I will kill you bitch! Shut the fuck up!" Redirection not effective at this time. Pt scaring other patients on the unit.

## 2014-08-22 NOTE — Progress Notes (Signed)
CSW received call back from [place] [facility] at [phone] to assist with pt case.   Byrd HesselbachKristen Lera Gaines, LCSW [phone]  ED CSW 08/22/2014 1030am

## 2014-08-22 NOTE — ED Notes (Addendum)
Patient screaming and punching at staff. Pt screaming "Fuck you bitches! Get out of my house! Fuck you!" repeatedly. Pt then throwing his walker across the room at staff and hitting the glass window. Pt also spitting multiple times in the faces of staff. Security and [employer] in room to assist staff. Pt unable to be redirected. Spit mask placed on patient's face, but he continues to spit aggressively. Dr. Denton LankSteinl notified; new orders obtained.

## 2014-08-22 NOTE — Progress Notes (Signed)
CSW spoke with Hermine MessickNarissa at [facility] Care Coordinator for I/DD. Per Hermine MessickNarissa they are actively working on placement for patient however are unaware of any vacancies. Per Hermine MessickNarissa they will be working on placement as a team effort and also reaching out to mental health care coordinators as well.   CSW also received a call from Eben BurowAl Gainey [phone] who stated that they are looking at [facility] again in order to see if they can make room for patient by moving a patient to the second floor. Per Mr. Pricilla LovelessGainey they are also [facility] for TBI patients.   APS continuing to follow patient for guardianship.  Vaughan Sinehedosia Williams care coordinator also continuing to follow patient.   Byrd HesselbachKristen Antavius Sperbeck, LCSW [phone]  ED CSW 08/22/2014 1215pm

## 2014-08-22 NOTE — Consult Note (Signed)
Patient was acting out this morning cursing and was informed that patient spit on [employer] officer.  Patient was easily redirected by nurse tech Irene Limbo(Kizzy).  Patient is now in his room sitting on bed.  Patient was made to go to room and not look at TV because of his behavior.  Patient asking to look at TV.   When approached patient; patient stated "Leave me a lone; get out."  I then asked patient who are you talking to like that; who you telling to get out; Patient then stated "I didn't say that" Patient is at his base line; he will curse.  Patient doesn't like to have a man to get into his face and responds to women better than men.  Patient would not give a reason why he spit on the police officer other that he get in my face.    Constantly try to speak with staff (nurses and tech) that patient is redirectable.  Patient has to be approached calmly and no authoritative. He responds better with a calm voice and not a demanding voice that is ordering him to do something.  Redirection has to be direct as to what you want him to do but not demanding.    Also encouraged staff to attempt redirecting patient prior to any medication for agitation.  Medication on the agitation protocol has been discontinued at this time so that nurse will need to approach doctor prior to given.    Face to face interview with patient with Dr. Ladona Ridgelaylor who agrees with the above plan to limit injectable agitation mediation.  Try redirection patient first with calm voice but being direct in what it is you wish patient to do.  Have patience with patient.   [name] B. Rankin FNP-BC

## 2014-08-22 NOTE — ED Notes (Signed)
After giving pt his meal tray his anger and aggression quiclkly began to escalate and he began to throw items in his room including trying to throw the bedside table out into the hall, throwing food from his tray and swinging and almost hitting myself. All unattached items removed from pts room. Will continue to monitor. There are no PRN orders at this time that we can give.

## 2014-08-23 MED ORDER — ZIPRASIDONE HCL 20 MG PO CAPS
ORAL_CAPSULE | ORAL | Status: AC
  Administered 2014-08-23: 16:00:00
  Filled 2014-08-23: qty 1

## 2014-08-23 MED ORDER — NICOTINE 21 MG/24HR TD PT24
MEDICATED_PATCH | TRANSDERMAL | Status: AC
  Administered 2014-08-23: 10:00:00
  Filled 2014-08-23: qty 1

## 2014-08-23 MED ORDER — ESCITALOPRAM OXALATE 10 MG PO TABS
ORAL_TABLET | ORAL | Status: AC
  Administered 2014-08-23: 10:00:00
  Filled 2014-08-23: qty 1

## 2014-08-23 MED ORDER — DIVALPROEX SODIUM 500 MG PO DR TAB
DELAYED_RELEASE_TABLET | ORAL | Status: AC
  Administered 2014-08-23: 10:00:00
  Filled 2014-08-23: qty 1

## 2014-08-23 NOTE — ED Notes (Addendum)
After giving pt a shower he did not want to put back on a diaper. I explain to him that if I didn't put it on that he was going to have to go to the bathroom and he agree.

## 2014-08-23 NOTE — Progress Notes (Signed)
CSW called [facility] care coordinator [phone] in reference to the pt to check if any there were any updates regarding placement. The care coordinator did not answer the phone. However, a voicemail was left informing the coordinator call back.      Trish MageBrittney Hamza Empson, LCSWA [phone] ED CSW 08/23/2014

## 2014-08-23 NOTE — ED Notes (Signed)
Pt out in hall sitting I have explain to him as long as he sit outside his room he will not talk about sex or curse at the staff.

## 2014-08-23 NOTE — Progress Notes (Signed)
08/23/14 1429 Clinton GallantKimberly L Tabbatha Bordelon, RN, CCM [phone]   Late entry for 08/22/14 1215 [facility] ED CM spoke with 2 sitters, NT who work frequently with pt Inquired if pt understood that [facility]. NTs confirmed that pt's response to being discharged varies from moment to moment.  NTs (Kizzy and Katrina) reports pt makes various statements about going "home", then may state he is going home with one of them or his family.  Pt wearing adult incontinence diaper at intervals. Kizzy, NT has been encouraging q 2 hr restroom visits vs diaper application. CNAs report pt has voiced increased "hunger"  and has been noted to be satisfied with snacks between meals. Pt continues desire to "smoke" but unable to smoke. Please refer to NT notes  Discussed pt sleeping pattern related his Geodon administration & [facility], [facility]    08/23/14 0953 ED CM spoke with Ida [phone] when she called about DME (diaper supply) for pt.  1) discussed paper work MD signature needed from pcp Provided pcp fax information plus TCU fax number as needed Malachi Bondsda preference is to send to pcp, [facility] 2) confirmed size of diaper used as [facility] medium adult diapers 3) Discussed remove of [employer] home address for processing of order, provided pt home address until pending placement available. Malachi Bondsda states she will be in contact for an updated address in future with [facility] ED AM or PM CM.

## 2014-08-23 NOTE — ED Notes (Signed)
I walked PT around ED unit 1x pt did good he walked halfway without the walker. Pt was giving water and orange juice.

## 2014-08-24 MED ORDER — NICOTINE 21 MG/24HR TD PT24
MEDICATED_PATCH | TRANSDERMAL | Status: AC
  Administered 2014-08-24: 10:00:00
  Filled 2014-08-24: qty 1

## 2014-08-24 MED ORDER — DIVALPROEX SODIUM 500 MG PO DR TAB
DELAYED_RELEASE_TABLET | ORAL | Status: AC
  Administered 2014-08-24: 500 mg
  Filled 2014-08-24: qty 1

## 2014-08-24 MED ORDER — ZIPRASIDONE HCL 20 MG PO CAPS
20.0000 mg | ORAL_CAPSULE | Freq: Once | ORAL | Status: AC
  Administered 2014-08-24: 20 mg via ORAL
  Filled 2014-08-24: qty 1

## 2014-08-24 MED ORDER — ZIPRASIDONE HCL 20 MG PO CAPS
40.0000 mg | ORAL_CAPSULE | Freq: Every day | ORAL | Status: DC
  Administered 2014-08-24 – 2014-10-04 (×42): 40 mg via ORAL
  Filled 2014-08-24 (×45): qty 2

## 2014-08-24 MED ORDER — ESCITALOPRAM OXALATE 10 MG PO TABS
ORAL_TABLET | ORAL | Status: AC
  Administered 2014-08-24: 10 mg
  Filled 2014-08-24: qty 1

## 2014-08-24 NOTE — ED Notes (Signed)
Pt. Is now Sleeping.

## 2014-08-24 NOTE — Progress Notes (Addendum)
Pt care coordinator Vaughan Sinehedosia Williams [phone] and Page SpiroMartha Davidson [phone] assisting with placement.  Pt referred to:  [facility] to review next week. .  [facility].    Byrd HesselbachKristen Hudson Majkowski, LCSW [phone]  ED CSW 08/24/2014 2:45 PM

## 2014-08-24 NOTE — ED Notes (Signed)
D:Pt has been agitated coming into the nurses station twice swinging and threatening to hit staff. Pt was loud and cursing repetitive on the unit. Gave order when received from MD for Geodon. Pt was later able to rest. He is currently resting in his room. Respirations are even and unlabored. Safety maintained.

## 2014-08-24 NOTE — ED Notes (Signed)
Patient yelling and agitated, and hitting at staff on approach. Pt cursing and unable to be redirected. Sitter at bedside for safety. No s/s of distress noted.

## 2014-08-24 NOTE — ED Notes (Signed)
Pt.  Keeps coming out of the room behind the nursing station to sit in the chairs and refuses to move. Pt. also keeps getting out of bed very unsteady on his feet and proceeds to sit on the floor. Tech redirected him several times. (repeated this many times) Pt. has been very aggressive towards the nurse when it is time to give meds. Pt. Tried to spit and hit nurse.

## 2014-08-25 LAB — VALPROIC ACID LEVEL: VALPROIC ACID LVL: 43.7 ug/mL — AB (ref 50.0–100.0)

## 2014-08-25 MED ORDER — ZIPRASIDONE HCL 20 MG PO CAPS
ORAL_CAPSULE | ORAL | Status: AC
  Filled 2014-08-25: qty 2

## 2014-08-25 MED ORDER — OLANZAPINE 10 MG PO TBDP
10.0000 mg | ORAL_TABLET | Freq: Once | ORAL | Status: AC
  Administered 2014-08-25: 10 mg via ORAL

## 2014-08-25 MED ORDER — ZIPRASIDONE MESYLATE 20 MG IM SOLR
20.0000 mg | Freq: Once | INTRAMUSCULAR | Status: AC
  Administered 2014-08-25: 20 mg via INTRAMUSCULAR

## 2014-08-25 MED ORDER — ZIPRASIDONE MESYLATE 20 MG IM SOLR
INTRAMUSCULAR | Status: AC
  Administered 2014-08-25: 20 mg via INTRAMUSCULAR
  Filled 2014-08-25: qty 20

## 2014-08-25 MED ORDER — OLANZAPINE 10 MG PO TBDP
10.0000 mg | ORAL_TABLET | Freq: Every day | ORAL | Status: DC
  Filled 2014-08-25: qty 1

## 2014-08-25 NOTE — ED Notes (Signed)
Patient attempting to get OOB multiple times, punching RN in abdomen and arm while having brief changed. Pt spitting on staff and screaming curse words, unable to be redirected. MD notified, awaiting orders.

## 2014-08-25 NOTE — ED Notes (Signed)
During medication administration, patient spit on security staff and attempted to hit RN. Medication successfully given. Will continue to monitor.

## 2014-08-25 NOTE — ED Notes (Signed)
Dr Patria Maneampos notified of pt behavior and disruption to other pts and their visitors

## 2014-08-25 NOTE — ED Notes (Signed)
Patient screaming loudly towards staff "Shut the fuck up! Get the hell out my house! Stupid ass bitch! Fuck you! Bitch!". Pt combative and unable to be redirected. No s/s of distress at this time. Sitter at bedside for safety.
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
## 2014-08-25 NOTE — ED Notes (Signed)
Pyxis not allowing meds to be removed. Pharmacy notified to send meds
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
## 2014-08-26 DIAGNOSIS — F39 Unspecified mood [affective] disorder: Secondary | ICD-10-CM

## 2014-08-26 LAB — URINALYSIS, ROUTINE W REFLEX MICROSCOPIC
Bilirubin Urine: NEGATIVE
GLUCOSE, UA: NEGATIVE mg/dL
Hgb urine dipstick: NEGATIVE
Ketones, ur: NEGATIVE mg/dL
LEUKOCYTES UA: NEGATIVE
Nitrite: NEGATIVE
PH: 7 (ref 5.0–8.0)
Protein, ur: NEGATIVE mg/dL
Specific Gravity, Urine: 1.016 (ref 1.005–1.030)
Urobilinogen, UA: 1 mg/dL (ref 0.0–1.0)

## 2014-08-26 MED ORDER — LORAZEPAM 2 MG/ML IJ SOLN
INTRAMUSCULAR | Status: AC
  Filled 2014-08-26: qty 1

## 2014-08-26 MED ORDER — HALOPERIDOL LACTATE 5 MG/ML IJ SOLN
10.0000 mg | Freq: Once | INTRAMUSCULAR | Status: AC
  Administered 2014-08-26: 10 mg via INTRAMUSCULAR
  Filled 2014-08-26: qty 2

## 2014-08-26 MED ORDER — DIPHENHYDRAMINE HCL 50 MG/ML IJ SOLN
INTRAMUSCULAR | Status: AC
  Administered 2014-08-26: 02:00:00
  Filled 2014-08-26: qty 1

## 2014-08-26 MED ORDER — LORAZEPAM 2 MG/ML IJ SOLN
2.0000 mg | Freq: Once | INTRAMUSCULAR | Status: AC
  Administered 2014-08-26: 02:00:00 via INTRAMUSCULAR
  Filled 2014-08-26: qty 1

## 2014-08-26 MED ORDER — OLANZAPINE 10 MG IM SOLR
10.0000 mg | Freq: Once | INTRAMUSCULAR | Status: AC
  Administered 2014-08-26: 10 mg via INTRAMUSCULAR
  Filled 2014-08-26: qty 10

## 2014-08-26 MED ORDER — LORAZEPAM 2 MG/ML IJ SOLN
2.0000 mg | Freq: Once | INTRAMUSCULAR | Status: AC
  Administered 2014-08-26: 2 mg via INTRAMUSCULAR

## 2014-08-26 NOTE — Consult Note (Addendum)
[facility] Face-to[facility]Face [facility]iat[facility] Consult   Reason for referral:  Agitation  Referring Physician:  EDP  Cory [name] is an 33 y.o. male. 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 Depression     Pt was planning to start taking abilify 10/20  . Traumatic brain injury   . Seizures   . Bipolar 1 disorder    AXIS IV:  housing problems, other psychosocial or environmental problems, problems related to social environment and problems with primary support group AXIS V:  Serious symptoms  Plan:  Admit to inpatient psychiatry for stabilization.  Cory [name] assessed the patient and concurs with the plan.  Subjective:    HPI:  Patient was started on Depakote.  Patient sitting in hall smiling and talking to staff.  Patient appears to be calm at this time. Patient did get up to walk the hall using walker.     Review of Systems  Gastrointestinal:       Incont.  bowel at time  Genitourinary:       Incont urine at time  Musculoskeletal:       Ambulation with walker  Neurological: Positive for seizures.       Patient has a history of TBI  Psychiatric/Behavioral: Negative for depression, suicidal ideas, hallucinations and memory loss. The patient is not nervous/anxious and does not have insomnia.   All other systems reviewed and are negative.   Past Psychiatric History: Past Medical History  Diagnosis Date  . Depression     Pt was planning to start taking abilify 10/20  . Traumatic brain injury   . Seizures   . Bipolar 1 disorder     reports that he has quit smoking. He does not have any smokeless tobacco history on file. He reports that he does not drink alcohol or use illicit drugs. History reviewed. No pertinent family history.         Allergies:  No Known Allergies  ACT Assessment Complete:   Yes:    Educational Status    Risk to Self: Risk to self with the past 6 months Is patient at risk for suicide?: No Substance abuse history and/or treatment for substance abuse?: No  Risk to Others:    Abuse:    Prior Inpatient Therapy:    Prior Outpatient Therapy:    Additional Information:          Objective: Blood pressure 120/48, pulse 68, temperature 98.1 F (36.7 C), temperature source Oral, resp. rate 20, SpO2 95 %.There is no weight on file to calculate BMI. Results for orders placed or performed during the hospital encounter of 07/28/14 (from the past 72 hour(s))  Valproic acid level     Status: Abnormal   Collection Time: 08/25/14 12:14 PM  Result Value Ref Range   Valproic Acid Lvl 43.7 (L) 50.0 - 100.0 ug/mL    Comment: [facility]   Labs are reviewed see above values.  Medications reviewed and no changes made.  Current Facility-Administered Medications  Medication Dose Route Frequency Provider Last Rate Last Dose  . acetaminophen (TYLENOL) tablet 650 mg  650 mg Oral Q4H PRN Cory HelperBowie Tran, PA-C      . alum & mag hydroxide-simeth (MAALOX/MYLANTA) 200-200-20 MG/5ML suspension 30 mL  30 mL Oral PRN Cory HelperBowie Tran, PA-C      .  divalproex (DEPAKOTE) DR tablet 500 mg  500 mg Oral Q12H Cory [name]   500 mg at 08/26/14 1003  . ibuprofen (ADVIL,MOTRIN) tablet 600 mg  600 mg Oral Q8H PRN Cory MeansJamison Lord, NP      . nicotine (NICODERM CQ - dosed in mg/24 hours) patch 21 mg  21 mg Transdermal Daily Cory HelperBowie Tran, PA-C   21 mg at 08/26/14 1022  . nicotine polacrilex (NICORETTE) gum 2 mg  2 mg Oral PRN Cory Joyharles E Kober, PA-C   2 mg at 08/19/14 0535  . ondansetron (ZOFRAN) tablet 4 mg  4 mg Oral Q8H PRN Cory HelperBowie Tran, PA-C      . ziprasidone (GEODON) capsule 40 mg  40 mg Oral QPC supper Cory [name]   40 mg at 08/25/14 1756   Current Outpatient Prescriptions  Medication Sig Dispense Refill  . acetaminophen (TYLENOL) 325 MG tablet Take 650 mg by mouth every 6 (six)  hours as needed for moderate pain.    . divalproex (DEPAKOTE) 500 MG DR tablet Take 1 tablet (500 mg total) by mouth every 12 (twelve) hours. 60 tablet 0  . escitalopram (LEXAPRO) 10 MG tablet Take 1 tablet (10 mg total) by mouth daily. 30 tablet 3  . ibuprofen (ADVIL,MOTRIN) 600 MG tablet Take 600 mg by mouth every 6 (six) hours as needed for moderate pain.    Marland Kitchen. LORazepam (ATIVAN) 1 MG tablet Take 1 tablet (1 mg total) by mouth every 8 (eight) hours as needed for anxiety (agitation). 30 tablet 0  . multivitamin-iron-minerals-folic acid (CENTRUM) chewable tablet Chew 1 tablet by mouth daily.    . QUEtiapine (SEROQUEL) 50 MG tablet Take 1 tablet (50 mg total) by mouth 4 (four) times daily. 150 tablet 0  . traZODone (DESYREL) 100 MG tablet Take 1 tablet (100 mg total) by mouth at bedtime.      Psychiatric Specialty Exam:      Blood pressure 120/48, pulse 68, temperature 98.1 F (36.7 C), temperature source Oral, resp. rate 20, SpO2 95 %.There is no weight on file to calculate BMI.  General Appearance: Casual  Eye Contact::  Fair  Speech:  Clear, slow  Volume:  Normal  Mood:  Irritable  Affect:  Blunt  Thought Process:  Coherent  Orientation:  Other:  Person and place  Thought Content:  "I'm good"  Suicidal Thoughts:  No  Homicidal Thoughts:  No  Memory:  Immediate;   Poor Recent;   Poor Remote;   Poor  Judgement:  Impaired  Insight:  Fair and Lacking  Psychomotor Activity:  Normal  Concentration:  Poor  Recall:  Poor  Fund of Knowledge:Poor  Language: Poor  Akathisia:  No  Handed:  Right  AIMS (if indicated):     Assets:  Social Support  Sleep:      Musculoskeletal: Strength & Muscle Tone: within normal limits Gait & Station: unsteady, shuffle Patient leans: Front  Treatment Plan Summary: Admit to inpatient psychiatry for stability of mood[name]   Will continue with current treatment plan.   [name], [name], FNP-BC  08/26/2014 11:12  AM  I have personally seen the patient and agreed with the findings and involved in the treatment plan. Kathryne SharperSyed Arfeen, MD

## 2014-08-26 NOTE — ED Notes (Addendum)
Patient continually yelling and cursing at staff "Shut the fuck up you dumb ass bitch!" Pt also constantly attempting to get OOB unassisted. Pt remains delusional and believes that staff is in his house. Zyprexa not effective.

## 2014-08-26 NOTE — BH Assessment (Signed)
[facility] Assessment Progress Note  Per [facility] request, most recent neurology note was sent with background information on pt's brain injury and most recent seizure. [facility] and ordered by Denice BorsShuvon, NP.

## 2014-08-26 NOTE — ED Notes (Signed)
Patient labile, screaming and delusional. Pt repeatedly states "Get out my fucking momma's house!" Staff unable to redirect or reorient patient at this time. Drenda FreezeFran, NP notified; new orders placed.

## 2014-08-26 NOTE — ED Notes (Signed)
Patient combative, cursing, screaming loudly on the unit and quickly jumping out of bed nearly falling. Pt unable to be redirected by staff. Pt attempting to spit on staff. MD aware, new orders placed.

## 2014-08-26 NOTE — ED Notes (Signed)
Patient currently asleep. Medications effective. EKG completed at 2009. No s/s of distress noted; respirations regular and unlabored, skin warm and dry.

## 2014-08-26 NOTE — BH Assessment (Signed)
[facility] current urinalysis results.    Clista BernhardtNancy Abyan Cadman, [facility] Triage Specialist 08/26/2014 9:04 PM

## 2014-08-26 NOTE — ED Provider Notes (Signed)
Pt agitated. Out of bed. Pacing. Attempting to leave. Convinced to get back into bed. Was given Toradol 10, Ativan 2 mg IM. Much more calm. On recheck is calm and resting. EKG was obtained because of multiple medications with potential QT prolongation. QTC 0.445.  Cory PorterMark Cheri Ayotte, MD 08/26/14 2306

## 2014-08-26 NOTE — BH Assessment (Signed)
[facility] 08[facility]26/14 Coralee [place] [facility], auth # is [id number].  Told her that Dr. Jacky KindleAronson, our medical director had talked with [facility]ook [facility], and that we request him to be put on fast track.

## 2014-08-27 DIAGNOSIS — Z87820 Personal history of traumatic brain injury: Secondary | ICD-10-CM

## 2014-08-27 DIAGNOSIS — R4189 Other symptoms and signs involving cognitive functions and awareness: Secondary | ICD-10-CM
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
## 2014-08-27 NOTE — ED Notes (Signed)
Pt now awake, yelling out loudly and cursing. Pt restless at this time. No obvious distress noted currently.

## 2014-08-27 NOTE — BH Assessment (Signed)
[facility] Assessment Progress Note   Update:  [facility] @ [phone], [facility]  Updated ED and TTS staff.  Casimer LaniusKristen Ponciano Shealy, MS, [facility] Licensed Professional Counselor Therapeutic Triage Specialist [facility] Phone: [phone] Fax: [phone]

## 2014-08-27 NOTE — Consult Note (Signed)
[facility]   Reason for referral:  Agitation  Referring Physician:  EDP  Pablo Lawrenceykwan Zeigler is an 33 y.o. male. Total Time spent with patient: 30 minutes  Assessment: AXIS I:  Cognitive Deficits, Traumatic Brain Injury AXIS II:  Deferred AXIS III:   Past Medical History  Diagnosis Date  . Depression     Pt was planning to start taking abilify 10/20  . Traumatic brain injury   . Seizures   . Bipolar 1 disorder    AXIS IV:  housing problems, other psychosocial or environmental problems, problems related to social environment and problems with primary support group AXIS V:  Serious symptoms  Plan:  Admit to inpatient psychiatry for stabilization.  Dr. Jannifer FranklinAkintayo assessed the patient and concurs with the plan.  Subjective:   HPI:  Patient required PRN Haldol last night because of his aggression and agitation.  He slept about five hours and was irritable this am, cursing and threatening.   Review of Systems  HENT: Negative for congestion.   Respiratory: Negative for cough, shortness of breath and wheezing.   Gastrointestinal: Negative for nausea, vomiting, abdominal pain, diarrhea and constipation.       Incont.  bowel   Genitourinary:       Incont. bladder   Neurological: Positive for seizures. Negative for dizziness, tremors and headaches.  Psychiatric/Behavioral: Negative for depression, suicidal ideas, hallucinations and memory loss. The patient is not nervous/anxious and does not have insomnia.   All other systems reviewed and are negative.   Past Psychiatric History: Past Medical History  Diagnosis Date  . Depression     Pt was planning to start taking abilify 10/20  . Traumatic brain injury   . Seizures   . Bipolar 1 disorder     reports that he has quit smoking. He does not have any smokeless tobacco history on file. He reports that he does not drink alcohol or use illicit drugs. History reviewed. No pertinent family history.          Allergies:  No Known Allergies  ACT Assessment Complete:  Yes:    Educational Status    Risk to Self: Risk to self with the past 6 months Is patient at risk for suicide?: No Substance abuse history and/or treatment for substance abuse?: No  Risk to Others:    Abuse:    Prior Inpatient Therapy:    Prior Outpatient Therapy:    Additional Information:          Objective: Blood pressure 104/69, pulse 71, temperature 98.2 F (36.8 C), temperature source Oral, resp. rate 18, SpO2 96 %.There is no weight on file to calculate BMI. Results for orders placed or performed during the hospital encounter of 07/28/14 (from the past 72 hour(s))  Valproic acid level     Status: Abnormal   Collection Time: 08/25/14 12:14 PM  Result Value Ref Range   Valproic Acid Lvl 43.7 (L) 50.0 - 100.0 ug/mL    Comment: [facility]  Urinalysis, Routine w reflex microscopic     Status: None   Collection Time: 08/26/14  7:51 PM  Result Value Ref Range   Color, Urine YELLOW YELLOW   APPearance CLEAR CLEAR   Specific Gravity, Urine 1.016 1.005 - 1.030   pH 7.0 5.0 - 8.0   Glucose, UA NEGATIVE NEGATIVE mg/dL   Hgb urine dipstick NEGATIVE NEGATIVE   Bilirubin Urine NEGATIVE NEGATIVE   Ketones, ur NEGATIVE NEGATIVE mg/dL   Protein, ur NEGATIVE  NEGATIVE mg/dL   Urobilinogen, UA 1.0 0.0 - 1.0 mg/dL   Nitrite NEGATIVE NEGATIVE   Leukocytes, UA NEGATIVE NEGATIVE    Comment: MICROSCOPIC NOT DONE ON URINES WITH NEGATIVE PROTEIN, BLOOD, LEUKOCYTES, NITRITE, OR GLUCOSE <1000 mg/dL.   Labs are reviewed see above values.  Medications reviewed and no changes made.  Current Facility-Administered Medications  Medication Dose Route Frequency Provider Last Rate Last Dose  . acetaminophen (TYLENOL) tablet 650 mg  650 mg Oral Q4H PRN Fayrene Helper, PA-C      . alum & mag hydroxide-simeth (MAALOX/MYLANTA) 200-200-20 MG/5ML suspension 30 mL  30 mL Oral PRN Fayrene Helper, PA-C      . divalproex (DEPAKOTE) DR  tablet 500 mg  500 mg Oral Q12H Reeda Soohoo   500 mg at 08/27/14 0917  . ibuprofen (ADVIL,MOTRIN) tablet 600 mg  600 mg Oral Q8H PRN Nanine Means, NP      . nicotine (NICODERM CQ - dosed in mg/24 hours) patch 21 mg  21 mg Transdermal Daily Fayrene Helper, PA-C   21 mg at 08/26/14 1022  . nicotine polacrilex (NICORETTE) gum 2 mg  2 mg Oral PRN Court Joy, PA-C   2 mg at 08/19/14 0535  . ondansetron (ZOFRAN) tablet 4 mg  4 mg Oral Q8H PRN Fayrene Helper, PA-C      . ziprasidone (GEODON) capsule 40 mg  40 mg Oral QPC supper Darreld Hoffer   40 mg at 08/26/14 1610   Current Outpatient Prescriptions  Medication Sig Dispense Refill  . acetaminophen (TYLENOL) 325 MG tablet Take 650 mg by mouth every 6 (six) hours as needed for moderate pain.    . divalproex (DEPAKOTE) 500 MG DR tablet Take 1 tablet (500 mg total) by mouth every 12 (twelve) hours. 60 tablet 0  . escitalopram (LEXAPRO) 10 MG tablet Take 1 tablet (10 mg total) by mouth daily. 30 tablet 3  . ibuprofen (ADVIL,MOTRIN) 600 MG tablet Take 600 mg by mouth every 6 (six) hours as needed for moderate pain.    Marland Kitchen LORazepam (ATIVAN) 1 MG tablet Take 1 tablet (1 mg total) by mouth every 8 (eight) hours as needed for anxiety (agitation). 30 tablet 0  . multivitamin-iron-minerals-folic acid (CENTRUM) chewable tablet Chew 1 tablet by mouth daily.    . QUEtiapine (SEROQUEL) 50 MG tablet Take 1 tablet (50 mg total) by mouth 4 (four) times daily. 150 tablet 0  . traZODone (DESYREL) 100 MG tablet Take 1 tablet (100 mg total) by mouth at bedtime.      Psychiatric Specialty Exam:     Blood pressure 104/69, pulse 71, temperature 98.2 F (36.8 C), temperature source Oral, resp. rate 18, SpO2 96 %.There is no weight on file to calculate BMI.  General Appearance: Casual  Eye Contact::  Fair  Speech:  Slurred  Volume:  Normal  Mood:  Irritable  Affect:  Blunt  Thought Process:  Coherent  Orientation:  Other:  Person and place  Thought Content:  "I  don't like it here" Shut up, Fuck you"  Suicidal Thoughts:  No  Homicidal Thoughts:  No  Memory:  Immediate;   Poor Recent;   Poor Remote;   Poor  Judgement:  Impaired  Insight:  Lacking  Psychomotor Activity:  Normal  Concentration:  Poor  Recall:  Poor  Fund of Knowledge:Poor  Language: Poor  Akathisia:  No  Handed:  Right  AIMS (if indicated):     Assets:  Social Support  Sleep:  Musculoskeletal: Strength & Muscle Tone: within normal limits Gait & Station: unsteady, shuffle Patient leans: Front  Treatment Plan Summary: Admit to inpatient psychiatry for stability of mood[facility]  Nanine MeansLORD, JAMISON, PMH-NP 08/27/2014 3:31 PM   Patient seen, evaluated and I agree with notes by Nurse Practitioner. Thedore MinsMojeed Danaisha Celli, MD

## 2014-08-27 NOTE — ED Notes (Signed)
Patient resting in bed with his eyes closed; no s/s of distress noted. Sitter at bedside for safety. Respirations regular and unlabored.

## 2014-08-27 NOTE — ED Notes (Signed)
Patient's linen changed, brief changed and peri care completed. Pt combative and attempting to spit on RN during care.

## 2014-08-28 MED ORDER — LORAZEPAM 2 MG/ML IJ SOLN
2.0000 mg | Freq: Once | INTRAMUSCULAR | Status: AC
  Administered 2014-08-28: 2 mg via INTRAMUSCULAR
  Filled 2014-08-28: qty 1

## 2014-08-28 MED ORDER — ZIPRASIDONE MESYLATE 20 MG IM SOLR: 20.0000 mg | Freq: Once | INTRAMUSCULAR | Status: DC

## 2014-08-28 MED ORDER — HALOPERIDOL LACTATE 5 MG/ML IJ SOLN
5.0000 mg | Freq: Once | INTRAMUSCULAR | Status: AC
  Administered 2014-08-28: 5 mg via INTRAMUSCULAR
  Filled 2014-08-28: qty 1

## 2014-08-28 MED ORDER — ZIPRASIDONE MESYLATE 20 MG IM SOLR
10.0000 mg | Freq: Once | INTRAMUSCULAR | Status: AC
  Administered 2014-08-28: 10 mg via INTRAMUSCULAR
  Filled 2014-08-28: qty 20

## 2014-08-28 NOTE — ED Notes (Signed)
Patient awake, yelling, cursing and continues to attempt to get out of bed. Pt with incontinent urine and large bowel movement. Pt combative with care and spit at staff while cleaning him. Pt remains labile; staff unable to redirect behaviors.

## 2014-08-28 NOTE — ED Notes (Signed)
I walked pt around ER unit pt did good without walker.

## 2014-08-28 NOTE — Consult Note (Signed)
[facility]   Reason for referral:  Agitation  Referring Physician:  EDP  Cory [name] is an 33 y.o. male. Total Time spent with patient: 30 minutes  Assessment: AXIS I:  Cognitive Deficits, Traumatic Brain Injury AXIS II:  Deferred AXIS III:   Past Medical History  Diagnosis Date  . Depression     Pt was planning to start taking abilify 10/20  . Traumatic brain injury   . Seizures   . Bipolar 1 disorder    AXIS IV:  housing problems, other psychosocial or environmental problems, problems related to social environment and problems with primary support group AXIS V:  Serious symptoms  Plan:  Admit to inpatient psychiatry for stabilization.  Dr. Jannifer FranklinAkintayo assessed the patient and concurs with the plan.  Subjective:   HPI:  Patient sitting in room.  Patient cursed male doctor; stating that he did not like him. Patient was calm and cooperative with male nurse practitioner.  Patient pleasant and answered all question.  Patient states that he is ready to go home and wants to go outside to smoke.    Review of Systems  HENT: Negative for congestion.   Respiratory: Negative for cough, shortness of breath and wheezing.   Gastrointestinal: Negative for nausea, vomiting, abdominal pain, diarrhea and constipation.       Incont.  bowel   Genitourinary:       Incont. bladder   Neurological: Positive for seizures. Negative for dizziness, tremors and headaches.  Psychiatric/Behavioral: Negative for depression, suicidal ideas, hallucinations and memory loss. The patient is not nervous/anxious and does not have insomnia.   All other systems reviewed and are negative.   Past Psychiatric History: Past Medical History  Diagnosis Date  . Depression     Pt was planning to start taking abilify 10/20  . Traumatic brain injury   . Seizures   . Bipolar 1 disorder     reports that he has quit smoking. He does not have any smokeless tobacco history on file. He reports  that he does not drink alcohol or use illicit drugs. History reviewed. No pertinent family history.         Allergies:  No Known Allergies  ACT Assessment Complete:  Yes:    Educational Status    Risk to Self: Risk to self with the past 6 months Is patient at risk for suicide?: No Substance abuse history and/or treatment for substance abuse?: No  Risk to Others:    Abuse:    Prior Inpatient Therapy:    Prior Outpatient Therapy:    Additional Information:          Objective: Blood pressure 110/68, pulse 82, temperature 98.5 F (36.9 C), temperature source Oral, resp. rate 17, SpO2 97 %.There is no weight on file to calculate BMI. Results for orders placed or performed during the hospital encounter of 07/28/14 (from the past 72 hour(s))  Urinalysis, Routine w reflex microscopic     Status: None   Collection Time: 08/26/14  7:51 PM  Result Value Ref Range   Color, Urine YELLOW YELLOW   APPearance CLEAR CLEAR   Specific Gravity, Urine 1.016 1.005 - 1.030   pH 7.0 5.0 - 8.0   Glucose, UA NEGATIVE NEGATIVE mg/dL   Hgb urine dipstick NEGATIVE NEGATIVE   Bilirubin Urine NEGATIVE NEGATIVE   Ketones, ur NEGATIVE NEGATIVE mg/dL   Protein, ur NEGATIVE NEGATIVE mg/dL   Urobilinogen, UA 1.0 0.0 - 1.0 mg/dL   Nitrite NEGATIVE NEGATIVE  Leukocytes, UA NEGATIVE NEGATIVE    Comment: MICROSCOPIC NOT DONE ON URINES WITH NEGATIVE PROTEIN, BLOOD, LEUKOCYTES, NITRITE, OR GLUCOSE <1000 mg/dL.   Labs are reviewed see above values.  Medications reviewed and no changes made.  Current Facility-Administered Medications  Medication Dose Route Frequency Provider Last Rate Last Dose  . acetaminophen (TYLENOL) tablet 650 mg  650 mg Oral Q4H PRN Fayrene Helper, PA-C      . alum & mag hydroxide-simeth (MAALOX/MYLANTA) 200-200-20 MG/5ML suspension 30 mL  30 mL Oral PRN Fayrene Helper, PA-C      . divalproex (DEPAKOTE) DR tablet 500 mg  500 mg Oral Q12H Carmon Sahli   500 mg at 08/28/14 0953  . ibuprofen  (ADVIL,MOTRIN) tablet 600 mg  600 mg Oral Q8H PRN Nanine Means, NP      . nicotine (NICODERM CQ - dosed in mg/24 hours) patch 21 mg  21 mg Transdermal Daily Fayrene Helper, PA-C   21 mg at 08/26/14 1022  . nicotine polacrilex (NICORETTE) gum 2 mg  2 mg Oral PRN Court Joy, PA-C   2 mg at 08/19/14 0535  . ondansetron (ZOFRAN) tablet 4 mg  4 mg Oral Q8H PRN Fayrene Helper, PA-C      . ziprasidone (GEODON) capsule 40 mg  40 mg Oral QPC supper Ceniya Fowers   40 mg at 08/27/14 1833   Current Outpatient Prescriptions  Medication Sig Dispense Refill  . acetaminophen (TYLENOL) 325 MG tablet Take 650 mg by mouth every 6 (six) hours as needed for moderate pain.    . divalproex (DEPAKOTE) 500 MG DR tablet Take 1 tablet (500 mg total) by mouth every 12 (twelve) hours. 60 tablet 0  . escitalopram (LEXAPRO) 10 MG tablet Take 1 tablet (10 mg total) by mouth daily. 30 tablet 3  . ibuprofen (ADVIL,MOTRIN) 600 MG tablet Take 600 mg by mouth every 6 (six) hours as needed for moderate pain.    Marland Kitchen LORazepam (ATIVAN) 1 MG tablet Take 1 tablet (1 mg total) by mouth every 8 (eight) hours as needed for anxiety (agitation). 30 tablet 0  . multivitamin-iron-minerals-folic acid (CENTRUM) chewable tablet Chew 1 tablet by mouth daily.    . QUEtiapine (SEROQUEL) 50 MG tablet Take 1 tablet (50 mg total) by mouth 4 (four) times daily. 150 tablet 0  . traZODone (DESYREL) 100 MG tablet Take 1 tablet (100 mg total) by mouth at bedtime.      Psychiatric Specialty Exam:     Blood pressure 110/68, pulse 82, temperature 98.5 F (36.9 C), temperature source Oral, resp. rate 17, SpO2 97 %.There is no weight on file to calculate BMI.  General Appearance: Casual  Eye Contact::  Fair  Speech:  Slurred but understandable  Volume:  Normal  Mood:  Calm  Affect:  Blunt  Thought Process:  Coherent  Orientation:  Other:  Person and place  Thought Content:  "I don't like it here"   Suicidal Thoughts:  No  Homicidal Thoughts:  No   Memory:  Immediate;   Poor Recent;   Poor Remote;   Poor  Judgement:  Impaired  Insight:  Lacking  Psychomotor Activity:  Normal  Concentration:  Poor  Recall:  Poor  Fund of Knowledge:Poor  Language: Poor  Akathisia:  No  Handed:  Right  AIMS (if indicated):     Assets:  Social Support  Sleep:      Musculoskeletal: Strength & Muscle Tone: within normal limits Gait & Station: unsteady, shuffle Patient leans: Front  Treatment  Plan Summary: Admit to inpatient psychiatry for stability of mood, [facility].   Will continue to look for inpatient placement for stabilization of mood.   [name], [name], FNP-BC 08/28/2014 4:03 PM   Patient seen, evaluated and I agree with notes by Nurse Practitioner. Thedore MinsMojeed Prithvi Kooi, MD

## 2014-08-28 NOTE — ED Notes (Signed)
Pt awake watching tv in bed. Pt restless and irritable but easily redirected at present. Will continue to monitor closely and evaluate for stabilization.

## 2014-08-28 NOTE — ED Notes (Addendum)
Patient currently awake; no s/s of distress noted. Respirations regular and unlabored.

## 2014-08-28 NOTE — ED Notes (Signed)
Medication not effective. Pt continues to yell, curse, threaten staff, attempt to get out of bed and spit at staff. Staff unable to redirect patient's behaviors.

## 2014-08-28 NOTE — ED Notes (Signed)
Charge rn spoke with AC at [facility], still working on placement for pt.  

## 2014-08-28 NOTE — Progress Notes (Signed)
Per Junious Dresseronnie[facility].   Byrd HesselbachKristen Dorn Hartshorne, LCSW [phone]  ED CSW 08/28/2014 1157am

## 2014-08-28 NOTE — ED Notes (Signed)
Patient awake, screaming, cursing and threatening staff. Pt unable to be redirected at this time. Dr. Rhunette CroftNanavati notified; awaiting orders.

## 2014-08-28 NOTE — ED Notes (Signed)
Patient given meds as ordered by MD. Pt had urinated the bed; pt's brief changed and new linens provided. Pt attempting to strike staff and spit during care. Pt continued to curse staff and yell threats loudly. Pt unable to be redirected.

## 2014-08-29 NOTE — Progress Notes (Signed)
Pt awake watching tv in bed. Pt continues to be restless, irritable and loudly yelling out at intervals but easily redirected at present. Will continue to monitor closely and evaluate for stabilization.

## 2014-08-29 NOTE — ED Notes (Signed)
Pt. had breakfast, then a shower now he is sleeping.

## 2014-08-29 NOTE — Consult Note (Signed)
[facility]   Reason for referral:  Agitation  Referring Physician:  EDP  Cory [name] is an 33 y.o. male. Total Time spent with patient: 30 minutes  Assessment: AXIS I:  Cognitive Deficits, Traumatic Brain Injury AXIS II:  Deferred AXIS III:   Past Medical History  Diagnosis Date  . Depression     Pt was planning to start taking abilify 10/20  . Traumatic brain injury   . Seizures   . Bipolar 1 disorder    AXIS IV:  housing problems, other psychosocial or environmental problems, problems related to social environment and problems with primary support group AXIS V:  Serious symptoms  Plan:  Admit to inpatient psychiatry for stabilization.  Dr. Jannifer FranklinAkintayo assessed the patient and concurs with the plan.  Subjective:   HPI:  No recent changes.  Patient continues to curse men who come in room but talked calmly with women.  Patient has rested today no know outburst at this time.     Review of Systems  HENT: Negative for congestion.   Respiratory: Negative for cough, shortness of breath and wheezing.   Gastrointestinal: Negative for nausea, vomiting, abdominal pain, diarrhea and constipation.       Incont.  bowel   Genitourinary:       Incont. bladder   Neurological: Positive for seizures. Negative for dizziness, tremors and headaches.  Psychiatric/Behavioral: Negative for depression, suicidal ideas, hallucinations and memory loss. The patient is not nervous/anxious and does not have insomnia.   All other systems reviewed and are negative.   Past Psychiatric History: Past Medical History  Diagnosis Date  . Depression     Pt was planning to start taking abilify 10/20  . Traumatic brain injury   . Seizures   . Bipolar 1 disorder     reports that he has quit smoking. He does not have any smokeless tobacco history on file. He reports that he does not drink alcohol or use illicit drugs. History reviewed. No pertinent family history.          Allergies:  No Known Allergies  ACT Assessment Complete:  Yes:    Educational Status    Risk to Self: Risk to self with the past 6 months Is patient at risk for suicide?: No Substance abuse history and/or treatment for substance abuse?: No  Risk to Others:    Abuse:    Prior Inpatient Therapy:    Prior Outpatient Therapy:    Additional Information:          Objective: Blood pressure 96/65, pulse 73, temperature 98.7 F (37.1 C), temperature source Oral, resp. rate 18, SpO2 100 %.There is no weight on file to calculate BMI. Results for orders placed or performed during the hospital encounter of 07/28/14 (from the past 72 hour(s))  Urinalysis, Routine w reflex microscopic     Status: None   Collection Time: 08/26/14  7:51 PM  Result Value Ref Range   Color, Urine YELLOW YELLOW   APPearance CLEAR CLEAR   Specific Gravity, Urine 1.016 1.005 - 1.030   pH 7.0 5.0 - 8.0   Glucose, UA NEGATIVE NEGATIVE mg/dL   Hgb urine dipstick NEGATIVE NEGATIVE   Bilirubin Urine NEGATIVE NEGATIVE   Ketones, ur NEGATIVE NEGATIVE mg/dL   Protein, ur NEGATIVE NEGATIVE mg/dL   Urobilinogen, UA 1.0 0.0 - 1.0 mg/dL   Nitrite NEGATIVE NEGATIVE   Leukocytes, UA NEGATIVE NEGATIVE    Comment: MICROSCOPIC NOT DONE ON URINES WITH NEGATIVE PROTEIN, BLOOD,  LEUKOCYTES, NITRITE, OR GLUCOSE <1000 mg/dL.   Labs are reviewed see above values.  Medications reviewed and no changes made.  Current Facility-Administered Medications  Medication Dose Route Frequency Provider Last Rate Last Dose  . acetaminophen (TYLENOL) tablet 650 mg  650 mg Oral Q4H PRN Fayrene Helper, PA-C      . alum & mag hydroxide-simeth (MAALOX/MYLANTA) 200-200-20 MG/5ML suspension 30 mL  30 mL Oral PRN Fayrene Helper, PA-C      . divalproex (DEPAKOTE) DR tablet 500 mg  500 mg Oral Q12H Cory [name]   500 mg at 08/29/14 1035  . ibuprofen (ADVIL,MOTRIN) tablet 600 mg  600 mg Oral Q8H PRN Nanine Means, NP      . nicotine (NICODERM CQ - dosed in  mg/24 hours) patch 21 mg  21 mg Transdermal Daily Fayrene Helper, PA-C   21 mg at 08/26/14 1022  . nicotine polacrilex (NICORETTE) gum 2 mg  2 mg Oral PRN Court Joy, PA-C   2 mg at 08/19/14 0535  . ondansetron (ZOFRAN) tablet 4 mg  4 mg Oral Q8H PRN Fayrene Helper, PA-C      . ziprasidone (GEODON) capsule 40 mg  40 mg Oral QPC supper Estephania Licciardi   40 mg at 08/28/14 1841   Current Outpatient Prescriptions  Medication Sig Dispense Refill  . acetaminophen (TYLENOL) 325 MG tablet Take 650 mg by mouth every 6 (six) hours as needed for moderate pain.    . divalproex (DEPAKOTE) 500 MG DR tablet Take 1 tablet (500 mg total) by mouth every 12 (twelve) hours. 60 tablet 0  . escitalopram (LEXAPRO) 10 MG tablet Take 1 tablet (10 mg total) by mouth daily. 30 tablet 3  . ibuprofen (ADVIL,MOTRIN) 600 MG tablet Take 600 mg by mouth every 6 (six) hours as needed for moderate pain.    Marland Kitchen LORazepam (ATIVAN) 1 MG tablet Take 1 tablet (1 mg total) by mouth every 8 (eight) hours as needed for anxiety (agitation). 30 tablet 0  . multivitamin-iron-minerals-folic acid (CENTRUM) chewable tablet Chew 1 tablet by mouth daily.    . QUEtiapine (SEROQUEL) 50 MG tablet Take 1 tablet (50 mg total) by mouth 4 (four) times daily. 150 tablet 0  . traZODone (DESYREL) 100 MG tablet Take 1 tablet (100 mg total) by mouth at bedtime.      Psychiatric Specialty Exam:     Blood pressure 96/65, pulse 73, temperature 98.7 F (37.1 C), temperature source Oral, resp. rate 18, SpO2 100 %.There is no weight on file to calculate BMI.  General Appearance: Casual  Eye Contact::  Fair  Speech:  Slurred but understandable  Volume:  Normal  Mood:  Calm  Affect:  Blunt  Thought Process:  Coherent  Orientation:  Other:  Person and place  Thought Content:  "I don't like it here"   Suicidal Thoughts:  No  Homicidal Thoughts:  No  Memory:  Immediate;   Poor Recent;   Poor Remote;   Poor  Judgement:  Impaired  Insight:  Lacking   Psychomotor Activity:  Normal  Concentration:  Poor  Recall:  Poor  Fund of Knowledge:Poor  Language: Poor  Akathisia:  No  Handed:  Right  AIMS (if indicated):     Assets:  Social Support  Sleep:      Musculoskeletal: Strength & Muscle Tone: within normal limits Gait & Station: unsteady, shuffle Patient leans: Front  Treatment Plan Summary: Admit to inpatient psychiatry for stability of mood, place on [facility] wait list for violence.  Will continue to look for inpatient placement for stabilization of mood.   [name], [name], FNP-BC 08/29/2014 5:18 PM   Patient seen, evaluated and I agree with notes by Nurse Practitioner. Thedore MinsMojeed Chele Cornell, MD

## 2014-08-29 NOTE — ED Notes (Signed)
Changed pt. Wet Brief.

## 2014-08-29 NOTE — ED Notes (Signed)
Pt. Is woke and has been given a sandwich and drink

## 2014-08-29 NOTE — ED Notes (Signed)
Pt. Ate 75% of lunch meal

## 2014-08-29 NOTE — ED Notes (Signed)
Pt. Ate 50% dinner

## 2014-08-29 NOTE — Progress Notes (Signed)
Per Junious Dresseronnie[facility].   Byrd HesselbachKristen Mylinda Brook, LCSW [phone]  ED CSW 08/29/2014 1103am

## 2014-08-30 NOTE — ED Notes (Signed)
Security speaking with sitter. Pt verbally aggressive and spits at security.
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
## 2014-08-30 NOTE — Consult Note (Signed)
[facility]   Reason for referral:  Agitation  Referring Physician:  EDP  Cory [name] is an 33 y.o. male. Total Time spent with patient: 30 minutes  Assessment: AXIS I:  Cognitive Deficits, Traumatic Brain Injury AXIS II:  Deferred AXIS III:   Past Medical History  Diagnosis Date  . Depression     Pt was planning to start taking abilify 10/20  . Traumatic brain injury   . Seizures   . Bipolar 1 disorder    AXIS IV:  housing problems, other psychosocial or environmental problems, problems related to social environment and problems with primary support group AXIS V:  Serious symptoms  Plan:  Admit to inpatient psychiatry for stabilization.  Dr. Jannifer FranklinAkintayo assessed the patient and concurs with the plan.  Subjective:   HPI:  Patient resting.  Patient in a good mood this morning sitting in hallway with staff.  Patient states that he feels good.  Patient still states that he doesn't like men.       Review of Systems  HENT: Negative for congestion.   Respiratory: Negative for cough, shortness of breath and wheezing.   Gastrointestinal: Negative for nausea, vomiting, abdominal pain, diarrhea and constipation.       Incont.  bowel   Genitourinary:       Incont. bladder   Neurological: Positive for seizures. Negative for dizziness, tremors and headaches.  Psychiatric/Behavioral: Negative for depression, suicidal ideas, hallucinations and memory loss. The patient is not nervous/anxious and does not have insomnia.   All other systems reviewed and are negative.   Past Psychiatric History: Past Medical History  Diagnosis Date  . Depression     Pt was planning to start taking abilify 10/20  . Traumatic brain injury   . Seizures   . Bipolar 1 disorder     reports that he has quit smoking. He does not have any smokeless tobacco history on file. He reports that he does not drink alcohol or use illicit drugs. History reviewed. No pertinent family  history.         Allergies:  No Known Allergies  ACT Assessment Complete:  Yes:    Educational Status    Risk to Self: Risk to self with the past 6 months Is patient at risk for suicide?: No Substance abuse history and/or treatment for substance abuse?: No  Risk to Others:    Abuse:    Prior Inpatient Therapy:    Prior Outpatient Therapy:    Additional Information:          Objective: Blood pressure 127/85, pulse 90, temperature 98.5 F (36.9 C), temperature source Oral, resp. rate 18, SpO2 100 %.There is no weight on file to calculate BMI. No results found for this or any previous visit (from the past 72 hour(s)). Labs are reviewed see above values.  Medications reviewed and no changes made.  Current Facility-Administered Medications  Medication Dose Route Frequency Provider Last Rate Last Dose  . acetaminophen (TYLENOL) tablet 650 mg  650 mg Oral Q4H PRN [name], PA-C      . alum & mag hydroxide-simeth (MAALOX/MYLANTA) 200-200-20 MG/5ML suspension 30 mL  30 mL Oral PRN [name], PA-C      . divalproex (DEPAKOTE) DR tablet 500 mg  500 mg Oral Q12H [name]   500 mg at 08/30/14 0937  . ibuprofen (ADVIL,MOTRIN) tablet 600 mg  600 mg Oral Q8H PRN [name], NP      . nicotine (NICODERM  CQ - dosed in mg/24 hours) patch 21 mg  21 mg Transdermal Daily [name], PA-C   21 mg at 08/26/14 1022  . nicotine polacrilex (NICORETTE) gum 2 mg  2 mg Oral PRN [name], PA-C   2 mg at 08/19/14 0535  . ondansetron (ZOFRAN) tablet 4 mg  4 mg Oral Q8H PRN [name], PA-C      . ziprasidone (GEODON) capsule 40 mg  40 mg Oral QPC supper [name]   40 mg at 08/29/14 1907   Current Outpatient Prescriptions  Medication Sig Dispense Refill  . acetaminophen (TYLENOL) 325 MG tablet Take 650 mg by mouth every 6 (six) hours as needed for moderate pain.    . divalproex (DEPAKOTE) 500 MG DR tablet Take 1 tablet (500 mg total) by mouth every 12 (twelve) hours. 60 tablet 0   . escitalopram (LEXAPRO) 10 MG tablet Take 1 tablet (10 mg total) by mouth daily. 30 tablet 3  . ibuprofen (ADVIL,MOTRIN) 600 MG tablet Take 600 mg by mouth every 6 (six) hours as needed for moderate pain.    Marland Kitchen. LORazepam (ATIVAN) 1 MG tablet Take 1 tablet (1 mg total) by mouth every 8 (eight) hours as needed for anxiety (agitation). 30 tablet 0  . multivitamin-iron-minerals-folic acid (CENTRUM) chewable tablet Chew 1 tablet by mouth daily.    . QUEtiapine (SEROQUEL) 50 MG tablet Take 1 tablet (50 mg total) by mouth 4 (four) times daily. 150 tablet 0  . traZODone (DESYREL) 100 MG tablet Take 1 tablet (100 mg total) by mouth at bedtime.      Psychiatric Specialty Exam:     Blood pressure 127/85, pulse 90, temperature 98.5 F (36.9 C), temperature source Oral, resp. rate 18, SpO2 100 %.There is no weight on file to calculate BMI.  General Appearance: Casual  Eye Contact::  Fair  Speech:  Slurred but understandable  Volume:  Normal  Mood:  Calm  Affect:  Blunt  Thought Process:  Coherent  Orientation:  Other:  Person and place  Thought Content:  "I don't like it here"   Suicidal Thoughts:  No  Homicidal Thoughts:  No  Memory:  Immediate;   Poor Recent;   Poor Remote;   Poor  Judgement:  Impaired  Insight:  Lacking  Psychomotor Activity:  Normal  Concentration:  Poor  Recall:  Poor  Fund of Knowledge:Poor  Language: Poor  Akathisia:  No  Handed:  Right  AIMS (if indicated):     Assets:  Social Support  Sleep:      Musculoskeletal: Strength & Muscle Tone: within normal limits Gait & Station: unsteady, shuffle Patient leans: Front  Treatment Plan Summary: Admit to inpatient psychiatry for stability of mood[facility]   Continue with current treatment plan for inpatient treatment  [name], [name], FNP-BC 08/30/2014 5:14 PM    Patient seen, evaluated and I agree with notes by Nurse Practitioner. Thedore MinsMojeed Quierra Silverio, MD

## 2014-08-30 NOTE — ED Notes (Signed)
Pt calm at present time watching tv. Pt request staff to braid hair. Pt made aware his hair is too short to braid.
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
## 2014-08-30 NOTE — Progress Notes (Signed)
SW [facility] [place] [facility] who reported [facility] pt continues to be on wait list and should be accepted next week.  Derrell Lollingoris Adaley Kiene, MSW Social Worker [phone]
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
## 2014-08-31 MED ORDER — LORAZEPAM 2 MG/ML IJ SOLN
2.0000 mg | Freq: Once | INTRAMUSCULAR | Status: AC
  Administered 2014-08-31: 2 mg via INTRAMUSCULAR
  Filled 2014-08-31: qty 1

## 2014-08-31 MED ORDER — OLANZAPINE 10 MG PO TBDP
10.0000 mg | ORAL_TABLET | Freq: Every day | ORAL | Status: DC
  Administered 2014-08-31 – 2014-09-14 (×13): 10 mg via ORAL
  Filled 2014-08-31 (×4): qty 1
  Filled 2014-08-31: qty 2
  Filled 2014-08-31 (×10): qty 1

## 2014-08-31 MED ORDER — HALOPERIDOL LACTATE 5 MG/ML IJ SOLN: 5.0000 mg | Freq: Once | INTRAMUSCULAR | Status: DC

## 2014-08-31 NOTE — ED Notes (Signed)
Pt resting quietly. Sitter remains at bedside.  

## 2014-08-31 NOTE — ED Notes (Signed)
Patient sitting up in chair in doorway. Pt calm and cooperative with staff, pleasant at this time. No s/s of distress noted. Pt denies pain currently.

## 2014-08-31 NOTE — Consult Note (Signed)
[facility]   Reason for referral:  Agitation  Referring Physician:  EDP  Cory [name] is an 33 y.o. male. Total Time spent with patient: 30 minutes  Assessment: AXIS I:  Cognitive Deficits, Traumatic Brain Injury AXIS II:  Deferred AXIS III:   Past Medical History  Diagnosis Date  . Depression     Pt was planning to start taking abilify 10/20  . Traumatic brain injury   . Seizures   . Bipolar 1 disorder    AXIS IV:  housing problems, other psychosocial or environmental problems, problems related to social environment and problems with primary support group AXIS V:  Serious symptoms  Plan:  Admit to inpatient psychiatry for stabilization.  Dr. Jannifer FranklinAkintayo assessed the patient and concurs with the plan.  Subjective:   HPI:  Patient remains volatile with hitting staff at times, unprovoked and unpredictable.  [facility]. Review of Systems  HENT: Negative for congestion.   Respiratory: Negative for cough, shortness of breath and wheezing.   Gastrointestinal: Negative for nausea, vomiting, abdominal pain, diarrhea and constipation.       Incont.  bowel   Genitourinary:       Incont. bladder   Neurological: Positive for seizures. Negative for dizziness, tremors and headaches.  Psychiatric/Behavioral: Negative for depression, suicidal ideas, hallucinations and memory loss. The patient is not nervous/anxious and does not have insomnia.   All other systems reviewed and are negative.   Past Psychiatric History: Past Medical History  Diagnosis Date  . Depression     Pt was planning to start taking abilify 10/20  . Traumatic brain injury   . Seizures   . Bipolar 1 disorder     reports that he has quit smoking. He does not have any smokeless tobacco history on file. He reports that he does not drink alcohol or use illicit drugs. History reviewed. No pertinent family history.         Allergies:  No Known Allergies  ACT Assessment Complete:  Yes:     Educational Status    Risk to Self: Risk to self with the past 6 months Is patient at risk for suicide?: No Substance abuse history and/or treatment for substance abuse?: No  Risk to Others:    Abuse:    Prior Inpatient Therapy:    Prior Outpatient Therapy:    Additional Information:          Objective: Blood pressure 100/64, pulse 98, temperature 98 F (36.7 C), temperature source Oral, resp. rate 16, SpO2 100 %.There is no weight on file to calculate BMI. No results found for this or any previous visit (from the past 72 hour(s)). Labs are reviewed see above values.  Medications reviewed and no changes made.  Current Facility-Administered Medications  Medication Dose Route Frequency Provider Last Rate Last Dose  . acetaminophen (TYLENOL) tablet 650 mg  650 mg Oral Q4H PRN [name], PA-C      . alum & mag hydroxide-simeth (MAALOX/MYLANTA) 200-200-20 MG/5ML suspension 30 mL  30 mL Oral PRN [name], PA-C      . divalproex (DEPAKOTE) DR tablet 500 mg  500 mg Oral Q12H Reia Viernes   500 mg at 08/31/14 1009  . ibuprofen (ADVIL,MOTRIN) tablet 600 mg  600 mg Oral Q8H PRN [name], NP      . nicotine (NICODERM CQ - dosed in mg/24 hours) patch 21 mg  21 mg Transdermal Daily [name], PA-C   21 mg at 08/26/14 1022  .  nicotine polacrilex (NICORETTE) gum 2 mg  2 mg Oral PRN [name], PA-C   2 mg at 08/19/14 0535  . OLANZapine zydis (ZYPREXA) disintegrating tablet 10 mg  10 mg Oral QHS Lyanne CoKevin M Campos, MD   10 mg at 08/31/14 0014  . ondansetron (ZOFRAN) tablet 4 mg  4 mg Oral Q8H PRN [name], PA-C      . ziprasidone (GEODON) capsule 40 mg  40 mg Oral QPC supper Erryn Dickison   40 mg at 08/30/14 1802   Current Outpatient Prescriptions  Medication Sig Dispense Refill  . acetaminophen (TYLENOL) 325 MG tablet Take 650 mg by mouth every 6 (six) hours as needed for moderate pain.    . divalproex (DEPAKOTE) 500 MG DR tablet Take 1 tablet (500 mg total) by mouth every 12  (twelve) hours. 60 tablet 0  . escitalopram (LEXAPRO) 10 MG tablet Take 1 tablet (10 mg total) by mouth daily. 30 tablet 3  . ibuprofen (ADVIL,MOTRIN) 600 MG tablet Take 600 mg by mouth every 6 (six) hours as needed for moderate pain.    Marland Kitchen. LORazepam (ATIVAN) 1 MG tablet Take 1 tablet (1 mg total) by mouth every 8 (eight) hours as needed for anxiety (agitation). 30 tablet 0  . multivitamin-iron-minerals-folic acid (CENTRUM) chewable tablet Chew 1 tablet by mouth daily.    . QUEtiapine (SEROQUEL) 50 MG tablet Take 1 tablet (50 mg total) by mouth 4 (four) times daily. 150 tablet 0  . traZODone (DESYREL) 100 MG tablet Take 1 tablet (100 mg total) by mouth at bedtime.      Psychiatric Specialty Exam:     Blood pressure 100/64, pulse 98, temperature 98 F (36.7 C), temperature source Oral, resp. rate 16, SpO2 100 %.There is no weight on file to calculate BMI.  General Appearance: Casual  Eye Contact::  Fair  Speech:  Slurred  Volume:  Normal  Mood:  Irritable  Affect:  Blunt  Thought Process:  Coherent  Orientation:  Other:  Person and place  Thought Content:  "I don't like it here" Shut up, Fuck you"  Suicidal Thoughts:  No  Homicidal Thoughts:  No  Memory:  Immediate;   Poor Recent;   Poor Remote;   Poor  Judgement:  Impaired  Insight:  Lacking  Psychomotor Activity:  Normal  Concentration:  Poor  Recall:  Poor  Fund of Knowledge:Poor  Language: Poor  Akathisia:  No  Handed:  Right  AIMS (if indicated):     Assets:  Social Support  Sleep:      Musculoskeletal: Strength & Muscle Tone: within normal limits Gait & Station: unsteady, shuffle Patient leans: Front  Treatment Plan Summary: Admit to inpatient psychiatry for stability of mood[facility]  Nanine MeansLORD, JAMISON, PMH-NP 08/31/2014 4:43 PM  I   Patient seen, evaluated and I agree with notes by Nurse Practitioner. Cory MinsMojeed Keneshia Tena, MD

## 2014-08-31 NOTE — ED Notes (Signed)
Patient yelling and cursing staff during care. Pt attempting to spit and punch staff while turning. Staff unable to redirect behaviors. No s/s of distress noted at this time.

## 2014-08-31 NOTE — ED Notes (Signed)
Pt repeatedly calling out for sitter.

## 2014-08-31 NOTE — ED Notes (Signed)
Pt getting out of bed. Pt redirected to bed by staff.

## 2014-08-31 NOTE — Progress Notes (Signed)
Per Junious Dresseronnie, [facility]. CSW continues to look into placement. Pt not elligible for ICF or innovation funding as patient TBI resulted after the age of 33 (at age 33).   Pt continues to become combative and spit especially related to changing brief per notes.   Byrd HesselbachKristen Destry Bezdek, LCSW [phone]  ED CSW 08/31/2014 1254pm

## 2014-09-01 MED ORDER — DIPHENHYDRAMINE HCL 50 MG/ML IJ SOLN
50.0000 mg | Freq: Once | INTRAMUSCULAR | Status: AC
  Administered 2014-09-01: 50 mg via INTRAMUSCULAR

## 2014-09-01 MED ORDER — HALOPERIDOL LACTATE 5 MG/ML IJ SOLN
5.0000 mg | Freq: Once | INTRAMUSCULAR | Status: AC
  Administered 2014-09-01: 13:00:00 via INTRAMUSCULAR

## 2014-09-01 MED ORDER — LORAZEPAM 2 MG/ML IJ SOLN
2.0000 mg | Freq: Once | INTRAMUSCULAR | Status: AC
  Administered 2014-09-01: 2 mg via INTRAMUSCULAR
  Filled 2014-09-01: qty 1

## 2014-09-01 MED ORDER — DIPHENHYDRAMINE HCL 50 MG/ML IJ SOLN
INTRAMUSCULAR | Status: AC
  Administered 2014-09-01: 50 mg via INTRAMUSCULAR
  Filled 2014-09-01: qty 1

## 2014-09-01 MED ORDER — HALOPERIDOL LACTATE 5 MG/ML IJ SOLN
INTRAMUSCULAR | Status: AC
  Filled 2014-09-01: qty 1

## 2014-09-01 NOTE — ED Notes (Signed)
Patient urinating bed and clothing. Pt's scrubs changed; new linens placed on bed. Patient labile and holding up fist and attempting to strike RN during care. Patient cursing and repeatedly calling RN a "bitch". Pt back into bed after care completed.

## 2014-09-01 NOTE — Progress Notes (Signed)
Shift assessment note: this pt has been verbally aggressive and threatened staff but has no actually hit anyone. He does not appear or state that he is suicidal. He has a hx of a TBI and repeats himself frequently with the words "fuck you" and mumbles obsenities.he has a Comptrollersitter who is assisting him on a 1:1 and helping with his incontinence and setting him up for meals and helping him with bathing. He has anxiety and ativan 2 mg im was ordered. Staff continues to monitor.

## 2014-09-01 NOTE — ED Notes (Signed)
Pt giving crackers and coke

## 2014-09-01 NOTE — Consult Note (Signed)
[facility]   Reason for referral:  Agitation  Referring Physician:  EDP  Cory [name] is an 33 y.o. male. Total Time spent with patient: 30 minutes  Assessment: AXIS I:  Cognitive Deficits, Traumatic Brain Injury AXIS II:  Deferred AXIS III:   Past Medical History  Diagnosis Date  . Depression     Pt was planning to start taking abilify 10/20  . Traumatic brain injury   . Seizures   . Bipolar 1 disorder    AXIS IV:  housing problems, other psychosocial or environmental problems, problems related to social environment and problems with primary support group AXIS V:  Serious symptoms  Plan:  Admit to inpatient psychiatry for stabilization.    Subjective:   HPI:  Patient remains volatile with hitting staff at times, unprovoked and unpredictable.  He frequently tells people to "go away; shut up; f@%$ you."  [facility]waiting [facility]. Review of Systems  HENT: Negative for congestion.   Respiratory: Negative for cough, shortness of breath and wheezing.   Gastrointestinal: Negative for nausea, vomiting, abdominal pain, diarrhea and constipation.       Incont.  bowel   Genitourinary:       Incont. bladder   Neurological: Positive for seizures. Negative for dizziness, tremors and headaches.  Psychiatric/Behavioral: Negative for depression, suicidal ideas, hallucinations and memory loss. The patient is not nervous/anxious and does not have insomnia.   All other systems reviewed and are negative.   Past Psychiatric History: Past Medical History  Diagnosis Date  . Depression     Pt was planning to start taking abilify 10/20  . Traumatic brain injury   . Seizures   . Bipolar 1 disorder     reports that he has quit smoking. He does not have any smokeless tobacco history on file. He reports that he does not drink alcohol or use illicit drugs. History reviewed. No pertinent family history.         Allergies:  No Known Allergies  ACT Assessment Complete:  Yes:     Educational Status    Risk to Self: Risk to self with the past 6 months Is patient at risk for suicide?: No Substance abuse history and/or treatment for substance abuse?: No  Risk to Others:    Abuse:    Prior Inpatient Therapy:    Prior Outpatient Therapy:    Additional Information:          Objective: Blood pressure 102/65, pulse 82, temperature 98.1 F (36.7 C), temperature source Oral, resp. rate 20, SpO2 99 %.There is no weight on file to calculate BMI. No results found for this or any previous visit (from the past 72 hour(s)). Labs are reviewed see above values.  Medications reviewed and no changes made.  Current Facility-Administered Medications  Medication Dose Route Frequency Provider Last Rate Last Dose  . acetaminophen (TYLENOL) tablet 650 mg  650 mg Oral Q4H PRN [name], PA-C      . alum & mag hydroxide-simeth (MAALOX/MYLANTA) 200-200-20 MG/5ML suspension 30 mL  30 mL Oral PRN [name], PA-C      . divalproex (DEPAKOTE) DR tablet 500 mg  500 mg Oral Q12H Mojeed Akintayo   500 mg at 09/01/14 0750  . ibuprofen (ADVIL,MOTRIN) tablet 600 mg  600 mg Oral Q8H PRN Nanine MeansJamison Avira Tillison, NP      . nicotine (NICODERM CQ - dosed in mg/24 hours) patch 21 mg  21 mg Transdermal Daily [name], PA-C   21 mg at 08/26/14  1022  . nicotine polacrilex (NICORETTE) gum 2 mg  2 mg Oral PRN [name], PA-C   2 mg at 08/19/14 0535  . OLANZapine zydis (ZYPREXA) disintegrating tablet 10 mg  10 mg Oral QHS Lyanne CoKevin M Campos, MD   10 mg at 08/31/14 2049  . ondansetron (ZOFRAN) tablet 4 mg  4 mg Oral Q8H PRN [name], PA-C      . ziprasidone (GEODON) capsule 40 mg  40 mg Oral QPC supper Mojeed Akintayo   40 mg at 08/31/14 1830   Current Outpatient Prescriptions  Medication Sig Dispense Refill  . acetaminophen (TYLENOL) 325 MG tablet Take 650 mg by mouth every 6 (six) hours as needed for moderate pain.    . divalproex (DEPAKOTE) 500 MG DR tablet Take 1 tablet (500 mg total) by mouth every 12  (twelve) hours. 60 tablet 0  . escitalopram (LEXAPRO) 10 MG tablet Take 1 tablet (10 mg total) by mouth daily. 30 tablet 3  . ibuprofen (ADVIL,MOTRIN) 600 MG tablet Take 600 mg by mouth every 6 (six) hours as needed for moderate pain.    Marland Kitchen. LORazepam (ATIVAN) 1 MG tablet Take 1 tablet (1 mg total) by mouth every 8 (eight) hours as needed for anxiety (agitation). 30 tablet 0  . multivitamin-iron-minerals-folic acid (CENTRUM) chewable tablet Chew 1 tablet by mouth daily.    . QUEtiapine (SEROQUEL) 50 MG tablet Take 1 tablet (50 mg total) by mouth 4 (four) times daily. 150 tablet 0  . traZODone (DESYREL) 100 MG tablet Take 1 tablet (100 mg total) by mouth at bedtime.      Psychiatric Specialty Exam:     Blood pressure 102/65, pulse 82, temperature 98.1 F (36.7 C), temperature source Oral, resp. rate 20, SpO2 99 %.There is no weight on file to calculate BMI.  General Appearance: Casual  Eye Contact::  Fair  Speech:  Slurred  Volume:  Normal  Mood:  Irritable  Affect:  Blunt  Thought Process:  Coherent  Orientation:  Other:  Person and place  Thought Content:  "I don't like it here" Shut up, F*&% you, go away"  Suicidal Thoughts:  No  Homicidal Thoughts:  No  Memory:  Immediate;   Poor Recent;   Poor Remote;   Poor  Judgement:  Impaired  Insight:  Lacking  Psychomotor Activity:  Normal  Concentration:  Poor  Recall:  Poor  Fund of Knowledge:Poor  Language: Poor  Akathisia:  No  Handed:  Right  AIMS (if indicated):     Assets:  Social Support  Sleep:      Musculoskeletal: Strength & Muscle Tone: within normal limits Gait & Station: unsteady, shuffle Patient leans: Front  Treatment Plan Summary: Admit to inpatient psychiatry for stability of mood[facility]  Nanine MeansLORD, Tifanie Gardiner, PMH-NP 09/01/2014 8:35 AM  I

## 2014-09-01 NOTE — ED Notes (Signed)
Pt is watching TV (sponge bob) at times he yells out shut up and go home to the staff out at the front desk because he thinks this is his house. I redirect him and assure him that the people at the desk is working and will not mess with him. I have the door shut and the curtains pulled but he hears people talking and he yells out. Pt just need redirecting and assuring. When pt is hungry or restless he tends to yell out more. I give pt a sandwich in between every meal. He does not get along with males at all. Just there presents seems to set him off.

## 2014-09-02 MED ORDER — ZIPRASIDONE MESYLATE 20 MG IM SOLR
INTRAMUSCULAR | Status: AC
  Administered 2014-09-02: 20 mg via INTRAMUSCULAR
  Filled 2014-09-02: qty 20

## 2014-09-02 MED ORDER — ZIPRASIDONE MESYLATE 20 MG IM SOLR: 20.0000 mg | Freq: Once | INTRAMUSCULAR | Status: AC

## 2014-09-02 MED ORDER — ZIPRASIDONE MESYLATE 20 MG IM SOLR
20.0000 mg | Freq: Once | INTRAMUSCULAR | Status: AC
  Administered 2014-09-02 (×2): 20 mg via INTRAMUSCULAR
  Filled 2014-09-02: qty 20

## 2014-09-02 NOTE — ED Notes (Signed)
Pt continuously calling for "Kizzie" and "mom" and getting out of bed. Pt asked not to get out of bed because of unsteadiness. He also continues to swing and hit this RN when being redirected.

## 2014-09-02 NOTE — ED Notes (Signed)
Dressing applied to old scar on lf leg. Cleansed with NS and non stick dressing applied.

## 2014-09-02 NOTE — ED Notes (Signed)
Medication effective; pt sleeping at this time.

## 2014-09-02 NOTE — ED Notes (Signed)
Pt. Continues to shout out and swear awakening other patients.

## 2014-09-02 NOTE — ED Notes (Signed)
Pt consistantly attempting to get OOB since 0300, when sitter left. He is yelling and swearing to staff.When attempting to calm and redirect pt, he would lift his hand and swing to attempt to hit this nurse. Order for medication requested and administered.

## 2014-09-02 NOTE — Consult Note (Signed)
[facility]   Reason for referral:  Agitation  Referring Physician:  EDP  Pablo Lawrenceykwan Hubbert is an 33 y.o. male. Total Time spent with patient: 30 minutes  Assessment: AXIS I:  Cognitive Deficits, Traumatic Brain Injury AXIS II:  Deferred AXIS III:   Past Medical History  Diagnosis Date  . Depression     Pt was planning to start taking abilify 10/20  . Traumatic brain injury   . Seizures   . Bipolar 1 disorder    AXIS IV:  housing problems, other psychosocial or environmental problems, problems related to social environment and problems with primary support group AXIS V:  Serious symptoms  Plan:  Admit to inpatient psychiatry for stabilization.    Subjective:   HPI:  Patient resting in room asking for something to eat.  Explained to patient that he could get cheese and crackers and patient agreed.  Nursing states patient was with out a sitter for a little while last night and was agitated, and cursing.      Review of Systems  Gastrointestinal:       Incont.  bowel   Genitourinary:       Incont. bladder   Musculoskeletal:       Uses walker  Neurological: Positive for seizures. Negative for dizziness, tremors and headaches.  Psychiatric/Behavioral: Positive for depression, memory loss and substance abuse. Negative for suicidal ideas and hallucinations. The patient is not nervous/anxious.   All other systems reviewed and are negative.   Past Psychiatric History: Past Medical History  Diagnosis Date  . Depression     Pt was planning to start taking abilify 10/20  . Traumatic brain injury   . Seizures   . Bipolar 1 disorder     reports that he has quit smoking. He does not have any smokeless tobacco history on file. He reports that he does not drink alcohol or use illicit drugs. History reviewed. No pertinent family history.         Allergies:  No Known Allergies  ACT Assessment Complete:  Yes:    Educational Status    Risk to Self: Risk  to self with the past 6 months Is patient at risk for suicide?: No Substance abuse history and/or treatment for substance abuse?: No  Risk to Others:    Abuse:    Prior Inpatient Therapy:    Prior Outpatient Therapy:    Additional Information:          Objective: Blood pressure 106/87, pulse 100, temperature 98 F (36.7 C), temperature source Oral, resp. rate 12, SpO2 97 %.There is no weight on file to calculate BMI. No results found for this or any previous visit (from the past 72 hour(s)). Labs are reviewed see above values.  Medications reviewed and no changes made.  Current Facility-Administered Medications  Medication Dose Route Frequency Provider Last Rate Last Dose  . acetaminophen (TYLENOL) tablet 650 mg  650 mg Oral Q4H PRN [name], PA-C      . alum & mag hydroxide-simeth (MAALOX/MYLANTA) 200-200-20 MG/5ML suspension 30 mL  30 mL Oral PRN [name], PA-C      . divalproex (DEPAKOTE) DR tablet 500 mg  500 mg Oral Q12H Mojeed Akintayo   500 mg at 09/02/14 0902  . ibuprofen (ADVIL,MOTRIN) tablet 600 mg  600 mg Oral Q8H PRN [name], NP      . nicotine (NICODERM CQ - dosed in mg/24 hours) patch 21 mg  21 mg Transdermal Daily [name],  PA-C   21 mg at 09/02/14 0905  . nicotine polacrilex (NICORETTE) gum 2 mg  2 mg Oral PRN [name], PA-C   2 mg at 08/19/14 0535  . OLANZapine zydis (ZYPREXA) disintegrating tablet 10 mg  10 mg Oral QHS Lyanne CoKevin M Campos, MD   10 mg at 09/01/14 2241  . ondansetron (ZOFRAN) tablet 4 mg  4 mg Oral Q8H PRN [name], PA-C      . ziprasidone (GEODON) capsule 40 mg  40 mg Oral QPC supper Mojeed Akintayo   40 mg at 09/01/14 1643   Current Outpatient Prescriptions  Medication Sig Dispense Refill  . acetaminophen (TYLENOL) 325 MG tablet Take 650 mg by mouth every 6 (six) hours as needed for moderate pain.    . divalproex (DEPAKOTE) 500 MG DR tablet Take 1 tablet (500 mg total) by mouth every 12 (twelve) hours. 60 tablet 0  . escitalopram  (LEXAPRO) 10 MG tablet Take 1 tablet (10 mg total) by mouth daily. 30 tablet 3  . ibuprofen (ADVIL,MOTRIN) 600 MG tablet Take 600 mg by mouth every 6 (six) hours as needed for moderate pain.    Marland Kitchen. LORazepam (ATIVAN) 1 MG tablet Take 1 tablet (1 mg total) by mouth every 8 (eight) hours as needed for anxiety (agitation). 30 tablet 0  . multivitamin-iron-minerals-folic acid (CENTRUM) chewable tablet Chew 1 tablet by mouth daily.    . QUEtiapine (SEROQUEL) 50 MG tablet Take 1 tablet (50 mg total) by mouth 4 (four) times daily. 150 tablet 0  . traZODone (DESYREL) 100 MG tablet Take 1 tablet (100 mg total) by mouth at bedtime.      Psychiatric Specialty Exam:     Blood pressure 106/87, pulse 100, temperature 98 F (36.7 C), temperature source Oral, resp. rate 12, SpO2 97 %.There is no weight on file to calculate BMI.  General Appearance: Casual  Eye Contact::  Fair  Speech:  Slurred  Volume:  Normal  Mood:  Clam and resting  Affect:  Blunt  Thought Process:  Coherent  Orientation:  Other:  Person and place  Thought Content:  "I'm hungry"   Suicidal Thoughts:  No  Homicidal Thoughts:  No  Memory:  Immediate;   Poor Recent;   Poor Remote;   Poor  Judgement:  Impaired  Insight:  Lacking  Psychomotor Activity:  Normal  Concentration:  Poor  Recall:  Poor  Fund of Knowledge:Poor  Language: Poor  Akathisia:  No  Handed:  Right  AIMS (if indicated):     Assets:  Social Support  Sleep:      Musculoskeletal: Strength & Muscle Tone: within normal limits Gait & Station: unsteady, shuffle Patient leans: Front  Treatment Plan Summary: Admit to inpatient psychiatry for stability of mood[facility]  [facility].   Rankin, [name], FNP-BC 09/02/2014 2:12 PM  I

## 2014-09-02 NOTE — Progress Notes (Signed)
Patient resting and in NAD.

## 2014-09-02 NOTE — ED Notes (Signed)
Patient screaming and cursing staff and is combative with incontinence care. MD aware; new orders received.

## 2014-09-02 NOTE — ED Provider Notes (Signed)
After multiple attempts by nurses to get patient back into bed and calm down patient reassess verbally abusive, refusing to follow requests  Will re medicated with IM Geodon as he refuses PO medication   Arman FilterGail K Vanda Waskey, NP 09/02/14 0334  April K Palumbo-Rasch, MD 09/02/14 [phone]

## 2014-09-03 MED ORDER — LORAZEPAM 1 MG PO TABS: 1.0000 mg | ORAL_TABLET | Freq: Three times a day (TID) | ORAL | Status: DC | PRN

## 2014-09-03 MED ORDER — LORAZEPAM 2 MG/ML IJ SOLN
2.0000 mg | Freq: Once | INTRAMUSCULAR | Status: AC
  Administered 2014-09-03: 2 mg via INTRAMUSCULAR
  Filled 2014-09-03: qty 1

## 2014-09-03 MED ORDER — DIVALPROEX SODIUM 500 MG PO DR TAB
500.0000 mg | DELAYED_RELEASE_TABLET | Freq: Every day | ORAL | Status: DC
  Administered 2014-09-04 – 2014-10-05 (×32): 500 mg via ORAL
  Filled 2014-09-03 (×40): qty 1

## 2014-09-03 MED ORDER — ACETAMINOPHEN 325 MG PO TABS
650.0000 mg | ORAL_TABLET | Freq: Four times a day (QID) | ORAL | Status: DC | PRN
  Administered 2014-09-26: 650 mg via ORAL
  Filled 2014-09-03: qty 2

## 2014-09-03 MED ORDER — IBUPROFEN 200 MG PO TABS
600.0000 mg | ORAL_TABLET | Freq: Four times a day (QID) | ORAL | Status: DC | PRN
Start: 2014-09-03 — End: 2014-10-01

## 2014-09-03 MED ORDER — HALOPERIDOL LACTATE 5 MG/ML IJ SOLN
10.0000 mg | Freq: Once | INTRAMUSCULAR | Status: AC
  Administered 2014-09-03: 10 mg via INTRAMUSCULAR
  Filled 2014-09-03: qty 2

## 2014-09-03 MED ORDER — DIPHENHYDRAMINE HCL 50 MG/ML IJ SOLN
50.0000 mg | Freq: Once | INTRAMUSCULAR | Status: AC
  Administered 2014-09-03: 50 mg via INTRAMUSCULAR
  Filled 2014-09-03: qty 1

## 2014-09-03 MED ORDER — DIVALPROEX SODIUM 500 MG PO DR TAB
750.0000 mg | DELAYED_RELEASE_TABLET | Freq: Every day | ORAL | Status: DC
  Administered 2014-09-03 – 2014-10-04 (×32): 750 mg via ORAL
  Filled 2014-09-03 (×59): qty 1

## 2014-09-03 NOTE — Consult Note (Signed)
[facility]   Reason for referral:  Agitation  Referring Physician:  EDP  Cory [name] is an 33 y.o. male. Total Time spent with patient: 30 minutes  Assessment: AXIS I:  Cognitive Deficits, Traumatic Brain Injury AXIS II:  Deferred AXIS III:   Past Medical History  Diagnosis Date  . Depression     Pt was planning to start taking abilify 10/20  . Traumatic brain injury   . Seizures   . Bipolar 1 disorder    AXIS IV:  housing problems, other psychosocial or environmental problems, problems related to social environment and problems with primary support group AXIS V:  Serious symptoms  Plan:  [facility].    Subjective:   HPI:  Patient has has two nights of hitting and spitting on staff.  His agitation is not stabilizing.  Depakote 500 mg in the pm increased to 750 mg for stabilization.   [facility]waiting [facility]. Review of Systems  HENT: Negative for congestion.   Respiratory: Negative for cough, shortness of breath and wheezing.   Gastrointestinal: Negative for nausea, vomiting, abdominal pain, diarrhea and constipation.       Incont.  bowel   Genitourinary:       Incont. bladder   Neurological: Positive for seizures. Negative for dizziness, tremors and headaches.  Psychiatric/Behavioral: Negative for depression, suicidal ideas, hallucinations and memory loss. The patient is not nervous/anxious and does not have insomnia.   All other systems reviewed and are negative.   Past Psychiatric History: Past Medical History  Diagnosis Date  . Depression     Pt was planning to start taking abilify 10/20  . Traumatic brain injury   . Seizures   . Bipolar 1 disorder     reports that he has quit smoking. He does not have any smokeless tobacco history on file. He reports that he does not drink alcohol or use illicit drugs. History reviewed. No pertinent family history.         Allergies:  No Known Allergies  ACT  Assessment Complete:  Yes:    Educational Status    Risk to Self: Risk to self with the past 6 months Is patient at risk for suicide?: No Substance abuse history and/or treatment for substance abuse?: No  Risk to Others:    Abuse:    Prior Inpatient Therapy:    Prior Outpatient Therapy:    Additional Information:          Objective: Blood pressure 133/63, pulse 94, temperature 97.9 F (36.6 C), temperature source Oral, resp. rate 18, SpO2 98 %.There is no weight on file to calculate BMI. No results found for this or any previous visit (from the past 72 hour(s)). Labs are reviewed see above values.  Medications reviewed and no changes made.  Current Facility-Administered Medications  Medication Dose Route Frequency Provider Last Rate Last Dose  . acetaminophen (TYLENOL) tablet 650 mg  650 mg Oral Q4H PRN [name], PA-C      . acetaminophen (TYLENOL) tablet 650 mg  650 mg Oral Q6H PRN Arman FilterGail K Schulz, NP      . alum & mag hydroxide-simeth (MAALOX/MYLANTA) 200-200-20 MG/5ML suspension 30 mL  30 mL Oral PRN [name], PA-C      . Melene Muller[START ON 09/04/2014] divalproex (DEPAKOTE) DR tablet 500 mg  500 mg Oral Daily [name], NP      . divalproex (DEPAKOTE) DR tablet 750 mg  750 mg Oral Daily [name],  NP      . ibuprofen (ADVIL,MOTRIN) tablet 600 mg  600 mg Oral Q8H PRN [name], NP      . ibuprofen (ADVIL,MOTRIN) tablet 600 mg  600 mg Oral Q6H PRN Arman FilterGail K Schulz, NP      . nicotine (NICODERM CQ - dosed in mg/24 hours) patch 21 mg  21 mg Transdermal Daily [name], PA-C   21 mg at 09/02/14 0905  . nicotine polacrilex (NICORETTE) gum 2 mg  2 mg Oral PRN [name], PA-C   2 mg at 08/19/14 0535  . OLANZapine zydis (ZYPREXA) disintegrating tablet 10 mg  10 mg Oral QHS Lyanne CoKevin M Campos, MD   10 mg at 09/01/14 2241  . ondansetron (ZOFRAN) tablet 4 mg  4 mg Oral Q8H PRN [name], PA-C      . ziprasidone (GEODON) capsule 40 mg  40 mg Oral QPC supper Jovita Persing   40 mg at  09/02/14 1750   Current Outpatient Prescriptions  Medication Sig Dispense Refill  . acetaminophen (TYLENOL) 325 MG tablet Take 650 mg by mouth every 6 (six) hours as needed for moderate pain.    . divalproex (DEPAKOTE) 500 MG DR tablet Take 1 tablet (500 mg total) by mouth every 12 (twelve) hours. 60 tablet 0  . escitalopram (LEXAPRO) 10 MG tablet Take 1 tablet (10 mg total) by mouth daily. 30 tablet 3  . ibuprofen (ADVIL,MOTRIN) 600 MG tablet Take 600 mg by mouth every 6 (six) hours as needed for moderate pain.    Marland Kitchen. LORazepam (ATIVAN) 1 MG tablet Take 1 tablet (1 mg total) by mouth every 8 (eight) hours as needed for anxiety (agitation). 30 tablet 0  . multivitamin-iron-minerals-folic acid (CENTRUM) chewable tablet Chew 1 tablet by mouth daily.    . QUEtiapine (SEROQUEL) 50 MG tablet Take 1 tablet (50 mg total) by mouth 4 (four) times daily. 150 tablet 0  . traZODone (DESYREL) 100 MG tablet Take 1 tablet (100 mg total) by mouth at bedtime.      Psychiatric Specialty Exam:     Blood pressure 133/63, pulse 94, temperature 97.9 F (36.6 C), temperature source Oral, resp. rate 18, SpO2 98 %.There is no weight on file to calculate BMI.  General Appearance: Casual  Eye Contact::  Fair  Speech:  Slurred  Volume:  Normal  Mood:  Irritable  Affect:  Blunt  Thought Process:  Coherent  Orientation:  Other:  Person and place  Thought Content:  "Go away, leave me alone, shut up."  Suicidal Thoughts:  No  Homicidal Thoughts:  No  Memory:  Immediate;   Poor Recent;   Poor Remote;   Poor  Judgement:  Impaired  Insight:  Lacking  Psychomotor Activity:  Normal  Concentration:  Poor  Recall:  Poor  Fund of Knowledge:Poor  Language: Poor  Akathisia:  No  Handed:  Right  AIMS (if indicated):     Assets:  Social Support  Sleep:      Musculoskeletal: Strength & Muscle Tone: within normal limits Gait & Station: unsteady, shuffle Patient leans: Front  Treatment Plan Summary: Admit to  inpatient psychiatry for stability of mood, place [facility] for [facility], increase Depakote pm dose to 750 mg versus 500 mg for mood stability.  Nanine MeansLORD, JAMISON, PMH-NP 09/03/2014 8:38 AM  I   Patient seen, evaluated and I agree with notes by Nurse Practitioner. Thedore MinsMojeed Landa Mullinax, MD

## 2014-09-03 NOTE — ED Notes (Signed)
Patient with incontinent bowels. Pt cleaned, and new brief placed. Pt repositioned. No s/s of distress noted at this time.

## 2014-09-03 NOTE — Progress Notes (Signed)
[facility].   Byrd HesselbachKristen Adianna Darwin, LCSW [phone]  ED CSW 09/03/2014 12:07 PM

## 2014-09-03 NOTE — ED Notes (Signed)
Patient incontinent of urine. Pt's brief changed. Pt remains agitated with care.

## 2014-09-03 NOTE — ED Notes (Signed)
Pt giving Chinese chicken wings and fried rice he ate it all. Pt said thank you several times.

## 2014-09-03 NOTE — ED Notes (Signed)
Patient continually yelling on unit, agitated and combative with staff. Pt spitting on staff with care, and is unable to be redirected. Cory MeansJamison Lord, NP notified of behaviors; new orders obtained.

## 2014-09-03 NOTE — ED Notes (Signed)
The Dr. , gave pt candy he has enjoyed the [employer].

## 2014-09-03 NOTE — ED Notes (Signed)
Patient asleep; medications effective. Pt with no s/s of distress noted at this time. Respirations regular and unlabored

## 2014-09-03 NOTE — ED Notes (Signed)
Patient resting in bed with no s/s of distress at this time. Pt's respirations regular and unlabored. Sitter at bedside for safety.

## 2014-09-04 MED ORDER — BENZTROPINE MESYLATE 1 MG/ML IJ SOLN
1.0000 mg | Freq: Every day | INTRAMUSCULAR | Status: DC
  Administered 2014-09-04 – 2014-09-08 (×5): 1 mg via INTRAMUSCULAR
  Filled 2014-09-04 (×5): qty 2

## 2014-09-04 MED ORDER — HALOPERIDOL LACTATE 5 MG/ML IJ SOLN
2.0000 mg | INTRAMUSCULAR | Status: DC | PRN
  Administered 2014-09-07 – 2014-09-20 (×18): 2 mg via INTRAMUSCULAR
  Filled 2014-09-04 (×20): qty 1

## 2014-09-04 MED ORDER — DIPHENHYDRAMINE HCL 50 MG/ML IJ SOLN
50.0000 mg | Freq: Once | INTRAMUSCULAR | Status: AC
  Administered 2014-09-04: 50 mg via INTRAMUSCULAR
  Filled 2014-09-04: qty 1

## 2014-09-04 MED ORDER — LORAZEPAM 2 MG/ML IJ SOLN
2.0000 mg | Freq: Once | INTRAMUSCULAR | Status: AC
  Administered 2014-09-04: 2 mg via INTRAMUSCULAR
  Filled 2014-09-04: qty 1

## 2014-09-04 NOTE — ED Notes (Signed)
Pt is now woke
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
## 2014-09-04 NOTE — ED Notes (Signed)
Pt has fallen asleep after having a piece of candy and a sprite.

## 2014-09-04 NOTE — ED Notes (Signed)
Patient with urine incontinence. Pt cleaned and new brief placed. Pt cursing during care and attempting to spit on staff. Behaviors redirected. Pt given a snack and soda after he calmed down. No distress noted.

## 2014-09-04 NOTE — Progress Notes (Signed)
Pt discussed in Quality Collaboratives Meeting (previously  [facility] meeting)  [facility] with [facility] by [facility] staff

## 2014-09-04 NOTE — ED Notes (Signed)
[facility]

## 2014-09-04 NOTE — BH Assessment (Signed)
This Clinical research associatewriter confirmed with [facility] as a daily update to confirm pt's [facility] wait[facility] status. Per Robinette in admissions patient is currently on the waitlist.   Glorious PeachNajah Cloyde Oregel, MS, LCASA Assessment Counselor

## 2014-09-04 NOTE — ED Notes (Signed)
[facility] regarding pt's response to medications. Pt is calm and resting in the bed at this time.
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
## 2014-09-04 NOTE — ED Notes (Signed)
Pt giving a [place] sandwich and water

## 2014-09-04 NOTE — ED Notes (Signed)
Spencer,PA [facility] called to check on pt's condition and behaviors. Orders given for medications.
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
## 2014-09-04 NOTE — ED Notes (Signed)
Pt went to sleep

## 2014-09-05 MED ORDER — SULFAMETHOXAZOLE-TRIMETHOPRIM 800-160 MG PO TABS
1.0000 | ORAL_TABLET | Freq: Two times a day (BID) | ORAL | Status: AC
  Administered 2014-09-05 – 2014-09-09 (×10): 1 via ORAL
  Filled 2014-09-05 (×10): qty 1
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
## 2014-09-05 NOTE — ED Notes (Signed)
Pt ate all his breakfast

## 2014-09-05 NOTE — BH Assessment (Signed)
Patient continues to remain in the Emergency Department [facility]. Spoke with Coralee Northina [facility] at [phone] who confirmed [facility]

## 2014-09-05 NOTE — ED Notes (Signed)
End of shift-Pt was check to see if he was wet. Pt is not wet, bed is dry. Room cleaned.

## 2014-09-05 NOTE — ED Notes (Signed)
Pt is cursing,yelling and trying to spit at nurse and sitter as we take vital signs and change patient. Pt is not able to be redirected at this time.

## 2014-09-05 NOTE — Progress Notes (Addendum)
Per Connie, [facility]. [facility] be no[facility].   Patient mothers called about court day for guardianship. The CSW directed her to APS worker Tia Ladell Headsurner       Brittney Whitaker , CSW  Olga CoasterKristen Elvin Mccartin, [place] [phone]  ED CSW 09/05/2014 11:46 AM       .

## 2014-09-06 NOTE — Consult Note (Signed)
[facility]   Reason for referral:  Agitation  Referring Physician:  EDP  Cory [name] is an 33 y.o. male. Total Time spent with patient: 30 minutes  Assessment: AXIS I:  Cognitive Deficits, Traumatic Brain Injury AXIS II:  Deferred AXIS III:   Past Medical History  Diagnosis Date  . Depression     Pt was planning to start taking abilify 10/20  . Traumatic brain injury   . Seizures   . Bipolar 1 disorder    AXIS IV:  housing problems, other psychosocial or environmental problems, problems related to social environment and problems with primary support group AXIS V:  Serious symptoms  Plan:  Ad[facility]; patient remains labile with mood and emotions, aggressive at times--verbally and physically."   Subjective:   HPI:  Patient remains volatile with hitting staff at times, unprovoked and unpredictable.  He frequently tells people to "go away; shut up; f@%$ you."  [facility]waiting [facility]. Review of Systems  HENT: Negative for congestion.   Respiratory: Negative for cough, shortness of breath and wheezing.   Gastrointestinal: Negative for nausea, vomiting, abdominal pain, diarrhea and constipation.       Incont.  bowel   Genitourinary:       Incont. bladder   Neurological: Positive for seizures. Negative for dizziness, tremors and headaches.  Psychiatric/Behavioral: Negative for depression, suicidal ideas, hallucinations and memory loss. The patient is not nervous/anxious and does not have insomnia.   All other systems reviewed and are negative.   Past Psychiatric History: Past Medical History  Diagnosis Date  . Depression     Pt was planning to start taking abilify 10/20  . Traumatic brain injury   . Seizures   . Bipolar 1 disorder     reports that he has quit smoking. He does not have any smokeless tobacco history on file. He reports that he does not drink alcohol or use illicit drugs. History reviewed. No  pertinent family history.         Allergies:  No Known Allergies  ACT Assessment Complete:  Yes:    Educational Status    Risk to Self: Risk to self with the past 6 months Is patient at risk for suicide?: No Substance abuse history and/or treatment for substance abuse?: No  Risk to Others:    Abuse:    Prior Inpatient Therapy:    Prior Outpatient Therapy:    Additional Information:          Objective: Blood pressure 99/86, pulse 87, temperature 98.2 F (36.8 C), temperature source Oral, resp. rate 18, SpO2 100 %.There is no weight on file to calculate BMI. No results found for this or any previous visit (from the past 72 hour(s)). Labs are reviewed see above values.  Medications reviewed and no changes made.  Current Facility-Administered Medications  Medication Dose Route Frequency Provider Last Rate Last Dose  . acetaminophen (TYLENOL) tablet 650 mg  650 mg Oral Q6H PRN Arman FilterGail K Schulz, NP      . alum & mag hydroxide-simeth (MAALOX/MYLANTA) 200-200-20 MG/5ML suspension 30 mL  30 mL Oral PRN [name], PA-C      . benztropine mesylate (COGENTIN) injection 1 mg  1 mg Intramuscular Daily Kerry HoughSpencer E Simon, PA-C   1 mg at 09/06/14 1050  . divalproex (DEPAKOTE) DR tablet 500 mg  500 mg Oral Daily [name], NP   500 mg at 09/06/14 1039  . divalproex (DEPAKOTE) DR tablet 750  mg  750 mg Oral Daily Nanine Means, NP   750 mg at 09/05/14 1851  . haloperidol lactate (HALDOL) injection 2 mg  2 mg Intramuscular Q2H PRN Kerry Hough, PA-C      . ibuprofen (ADVIL,MOTRIN) tablet 600 mg  600 mg Oral Q6H PRN Arman Filter, NP      . nicotine (NICODERM CQ - dosed in mg/24 hours) patch 21 mg  21 mg Transdermal Daily Fayrene Helper, PA-C   21 mg at 09/02/14 0905  . nicotine polacrilex (NICORETTE) gum 2 mg  2 mg Oral PRN Court Joy, PA-C   2 mg at 08/19/14 0535  . OLANZapine zydis (ZYPREXA) disintegrating tablet 10 mg  10 mg Oral QHS Lyanne Co, MD   10 mg at 09/05/14 2100  . ondansetron  (ZOFRAN) tablet 4 mg  4 mg Oral Q8H PRN Fayrene Helper, PA-C      . sulfamethoxazole-trimethoprim (BACTRIM DS,SEPTRA DS) 800-160 MG per tablet 1 tablet  1 tablet Oral Q12H Raeford Razor, MD   1 tablet at 09/06/14 1039  . ziprasidone (GEODON) capsule 40 mg  40 mg Oral QPC supper Anivea Velasques   40 mg at 09/05/14 1851   Current Outpatient Prescriptions  Medication Sig Dispense Refill  . acetaminophen (TYLENOL) 325 MG tablet Take 650 mg by mouth every 6 (six) hours as needed for moderate pain.    . divalproex (DEPAKOTE) 500 MG DR tablet Take 1 tablet (500 mg total) by mouth every 12 (twelve) hours. 60 tablet 0  . escitalopram (LEXAPRO) 10 MG tablet Take 1 tablet (10 mg total) by mouth daily. 30 tablet 3  . ibuprofen (ADVIL,MOTRIN) 600 MG tablet Take 600 mg by mouth every 6 (six) hours as needed for moderate pain.    Marland Kitchen LORazepam (ATIVAN) 1 MG tablet Take 1 tablet (1 mg total) by mouth every 8 (eight) hours as needed for anxiety (agitation). 30 tablet 0  . multivitamin-iron-minerals-folic acid (CENTRUM) chewable tablet Chew 1 tablet by mouth daily.    . QUEtiapine (SEROQUEL) 50 MG tablet Take 1 tablet (50 mg total) by mouth 4 (four) times daily. 150 tablet 0  . traZODone (DESYREL) 100 MG tablet Take 1 tablet (100 mg total) by mouth at bedtime.      Psychiatric Specialty Exam:     Blood pressure 99/86, pulse 87, temperature 98.2 F (36.8 C), temperature source Oral, resp. rate 18, SpO2 100 %.There is no weight on file to calculate BMI.  General Appearance: Casual  Eye Contact::  Fair  Speech:  Slurred  Volume:  Normal  Mood:  Irritable  Affect:  Blunt  Thought Process:  Coherent  Orientation:  Other:  Person and place  Thought Content:  "I don't like it here" Shut up, F*&% you, go away"  Suicidal Thoughts:  No  Homicidal Thoughts:  No  Memory:  Immediate;   Poor Recent;   Poor Remote;   Poor  Judgement:  Impaired  Insight:  Lacking  Psychomotor Activity:  Normal  Concentration:  Poor   Recall:  Poor  Fund of Knowledge:Poor  Language: Poor  Akathisia:  No  Handed:  Right  AIMS (if indicated):     Assets:  Social Support  Sleep:      Musculoskeletal: Strength & Muscle Tone: within normal limits Gait & Station: unsteady, shuffle Patient leans: Front  Treatment Plan Summary: Admit to inpatient psychiatry for stability of mood, place on [facility] wait list for violence.  Nanine Means, PMH-NP 09/06/2014 11:40 AM  I   Patient seen, evaluated and I agree with notes by Nurse Practitioner. Thedore MinsMojeed Jahmiya Guidotti, MD

## 2014-09-06 NOTE — ED Notes (Signed)
Sitter and nurse went to change pt and offer snack, pt began to curse and yell as we changed him and no snack was given.

## 2014-09-06 NOTE — ED Notes (Signed)
Pt remains disruptive and yelling out staff members and anyone walking by the room. Breakfast given and 100% consumed by pt. Sitter at the bedside. Will continue to monitor.

## 2014-09-06 NOTE — BH Assessment (Signed)
Spoke with Vicky [facility] at [phone] who confirmed that [facility].

## 2014-09-06 NOTE — ED Notes (Signed)
Hourly rounding; pt sitting on the side of the bed yelling and cursing at staff at the desk. Pt alert and safety sitter at the bedside.

## 2014-09-06 NOTE — ED Notes (Addendum)
Writer told on coming sitter that she is going to check the pt again to see if  the pt. brief needed to be changed. Stter response do not worry about it I will change the pt. and give pt. a shower and told the writer you can go home I'll take care of it.
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
## 2014-09-06 NOTE — ED Notes (Signed)
Patient denies pain and is resting comfortably.  

## 2014-09-07 NOTE — ED Notes (Signed)
Dr. Anitra LauthPlunkett in room to assess wound to left lower leg while dressing was changed. Pt agitated and cursing while MD in room. No new orders obtained. Pt combative with staff during care.No s/s of distress noted at this time. Pt with incontinent urine. Pt's brief changed, new linens placed on bed. Pt given snack after MD left. Pt continues to yell intermittently. Sitter remains at bedside for safety.

## 2014-09-07 NOTE — Progress Notes (Addendum)
Late entry for 09/06/14 1345 ED CM spoke with EDP, Gwendolyn GrantWalden, about pt's observed hypersensitivity to noise and multiple conversations of others while he is located in his room and others are in various locations in [facility].  Pt has continued to scream out "shut up" very frequently.   While sitting in TCU directly across from pt's room, Cm noted he responded from his room to a male security staff members by name (prior to seeing staff member) as he entered TCU talking to another Visual merchandisersecurity staff  and then responded to Safeco CorporationKizzy, CNA, appropriately as she was having a conversation with another nursing staff member while they were in another separate location of TCU.   Inquired if this auditory hypersensitivity may be sensory overload for pt resulting in his aggressive verbal and physical behaviors and if further testing or interventions needed.    This was also discussed during 09/07/14 [facility] ED BH progression meeting    ED CM received a call from Broadus JohnIda of [employer]. Stated Dr Hyman HopesJegede has signed all forms needed for pt to receive incontinence supplies for [facility].  Malachi Bondsda will need to be called to continue processing of this order at [phone]. Provided Malachi BondsIda with [facility] ED PM CM number also. [facility] ED PM CM number updated on call
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
## 2014-09-07 NOTE — ED Notes (Signed)
Gave Pt Cory [name] and coke

## 2014-09-07 NOTE — ED Notes (Signed)
Changed Pt and performed peri-care with assistance of nurse pt was cooperative

## 2014-09-07 NOTE — Consult Note (Signed)
[facility] Face-to-Face Psychiatry Consult   Reason for referral:  Agitation  Referring Physician:  EDP  Cory [name] is an 33 y.o. male. Total Time spent with patient: 30 minutes  Assessment: AXIS I:  Cognitive Deficits, Traumatic Brain Injury AXIS II:  Deferred AXIS III:   Past Medical History  Diagnosis Date  . Depression     Pt was planning to start taking abilify 10/20  . Traumatic brain injury   . Seizures   . Bipolar 1 disorder    AXIS IV:  housing problems, other psychosocial or environmental problems, problems related to social environment and problems with primary support group AXIS V:  Serious symptoms  Plan:  [facility]; patient remains labile with mood and emotions, aggressive at times--verbally and physically."   Subjective:   HPI:  Patient has increased agitation with yelling, cursing, and spitting on staff.  PRN Haldol given with some relief.   Awaiting [facility]. Review of Systems  HENT: Negative for congestion.   Respiratory: Negative for cough, shortness of breath and wheezing.   Gastrointestinal: Negative for nausea, vomiting, abdominal pain, diarrhea and constipation.       Incont.  bowel   Genitourinary:       Incont. bladder   Neurological: Positive for seizures. Negative for dizziness, tremors and headaches.  Psychiatric/Behavioral: Negative for depression, suicidal ideas, hallucinations and memory loss. The patient is not nervous/anxious and does not have insomnia.   All other systems reviewed and are negative.   Past Psychiatric History: Past Medical History  Diagnosis Date  . Depression     Pt was planning to start taking abilify 10/20  . Traumatic brain injury   . Seizures   . Bipolar 1 disorder     reports that he has quit smoking. He does not have any smokeless tobacco history on file. He reports that he does not drink alcohol or use illicit drugs. History reviewed. No pertinent family history.          Allergies:  No Known Allergies  ACT Assessment Complete:  Yes:    Educational Status    Risk to Self: Risk to self with the past 6 months Is patient at risk for suicide?: No Substance abuse history and/or treatment for substance abuse?: No  Risk to Others:    Abuse:    Prior Inpatient Therapy:    Prior Outpatient Therapy:    Additional Information:          Objective: Blood pressure 113/67, pulse 100, temperature 99.5 F (37.5 C), temperature source Oral, resp. rate 20, SpO2 98 %.There is no weight on file to calculate BMI. No results found for this or any previous visit (from the past 72 hour(s)). Labs are reviewed see above values.  Medications reviewed and no changes made.  Current Facility-Administered Medications  Medication Dose Route Frequency Provider Last Rate Last Dose  . acetaminophen (TYLENOL) tablet 650 mg  650 mg Oral Q6H PRN Arman FilterGail K Schulz, NP      . alum & mag hydroxide-simeth (MAALOX/MYLANTA) 200-200-20 MG/5ML suspension 30 mL  30 mL Oral PRN [name], PA-C      . benztropine mesylate (COGENTIN) injection 1 mg  1 mg Intramuscular Daily Kerry HoughSpencer E Simon, PA-C   1 mg at 09/07/14 0936  . divalproex (DEPAKOTE) DR tablet 500 mg  500 mg Oral Daily [name], NP   500 mg at 09/07/14 0816  . divalproex (DEPAKOTE) DR tablet 750 mg  750 mg Oral  Daily [name], NP   750 mg at 09/06/14 1805  . haloperidol lactate (HALDOL) injection 2 mg  2 mg Intramuscular Q2H PRN Kerry HoughSpencer E Simon, PA-C   2 mg at 09/07/14 0936  . ibuprofen (ADVIL,MOTRIN) tablet 600 mg  600 mg Oral Q6H PRN Arman FilterGail K Schulz, NP      . nicotine (NICODERM CQ - dosed in mg/24 hours) patch 21 mg  21 mg Transdermal Daily [name], PA-C   21 mg at 09/07/14 [id number]  . nicotine polacrilex (NICORETTE) gum 2 mg  2 mg Oral PRN [name], PA-C   2 mg at 08/19/14 0535  . OLANZapine zydis (ZYPREXA) disintegrating tablet 10 mg  10 mg Oral QHS Lyanne CoKevin M Campos, MD   10 mg at 09/06/14 2112  . ondansetron (ZOFRAN) tablet  4 mg  4 mg Oral Q8H PRN [name], PA-C      . sulfamethoxazole-trimethoprim (BACTRIM DS,SEPTRA DS) 800-160 MG per tablet 1 tablet  1 tablet Oral Q12H Raeford RazorStephen Kohut, MD   1 tablet at 09/07/14 0936  . ziprasidone (GEODON) capsule 40 mg  40 mg Oral QPC supper Lennan Malone   40 mg at 09/06/14 1805   Current Outpatient Prescriptions  Medication Sig Dispense Refill  . acetaminophen (TYLENOL) 325 MG tablet Take 650 mg by mouth every 6 (six) hours as needed for moderate pain.    . divalproex (DEPAKOTE) 500 MG DR tablet Take 1 tablet (500 mg total) by mouth every 12 (twelve) hours. 60 tablet 0  . escitalopram (LEXAPRO) 10 MG tablet Take 1 tablet (10 mg total) by mouth daily. 30 tablet 3  . ibuprofen (ADVIL,MOTRIN) 600 MG tablet Take 600 mg by mouth every 6 (six) hours as needed for moderate pain.    Marland Kitchen. LORazepam (ATIVAN) 1 MG tablet Take 1 tablet (1 mg total) by mouth every 8 (eight) hours as needed for anxiety (agitation). 30 tablet 0  . multivitamin-iron-minerals-folic acid (CENTRUM) chewable tablet Chew 1 tablet by mouth daily.    . QUEtiapine (SEROQUEL) 50 MG tablet Take 1 tablet (50 mg total) by mouth 4 (four) times daily. 150 tablet 0  . traZODone (DESYREL) 100 MG tablet Take 1 tablet (100 mg total) by mouth at bedtime.      Psychiatric Specialty Exam:     Blood pressure 113/67, pulse 100, temperature 99.5 F (37.5 C), temperature source Oral, resp. rate 20, SpO2 98 %.There is no weight on file to calculate BMI.  General Appearance: Casual  Eye Contact::  Fair  Speech:  Slurred  Volume:  Normal  Mood:  Irritable  Affect:  Blunt  Thought Process:  Coherent  Orientation:  Other:  Person and place  Thought Content:  "I don't like it here" Shut up, F*&% you, go away"  Suicidal Thoughts:  No  Homicidal Thoughts:  No  Memory:  Immediate;   Poor Recent;   Poor Remote;   Poor  Judgement:  Impaired  Insight:  Lacking  Psychomotor Activity:  Normal  Concentration:  Poor  Recall:  Poor   Fund of Knowledge:Poor  Language: Poor  Akathisia:  No  Handed:  Right  AIMS (if indicated):     Assets:  Social Support  Sleep:      Musculoskeletal: Strength & Muscle Tone: within normal limits Gait & Station: unsteady, shuffle Patient leans: Front  Treatment Plan Summary: Admit to inpatient psychiatry for stability of mood[facility]  Nanine MeansLORD, JAMISON, PMH-NP 09/07/2014 2:35 PM  Patient seen, evaluated and I agree with notes by Nurse Practitioner. Sylvia Kondracki, MD 

## 2014-09-07 NOTE — ED Notes (Signed)
Pt is yelling and having conversations with his self. Gave pt a sandwich.

## 2014-09-08 MED ORDER — LORAZEPAM 2 MG/ML IJ SOLN
INTRAMUSCULAR | Status: AC
  Administered 2014-09-08: 2 mg via INTRAMUSCULAR
  Filled 2014-09-08: qty 1

## 2014-09-08 MED ORDER — LORAZEPAM 2 MG/ML IJ SOLN
1.0000 mg | Freq: Once | INTRAMUSCULAR | Status: AC
  Administered 2014-09-08: 1 mg via INTRAMUSCULAR
  Filled 2014-09-08: qty 1

## 2014-09-08 MED ORDER — LORAZEPAM 2 MG/ML IJ SOLN
2.0000 mg | Freq: Once | INTRAMUSCULAR | Status: AC
  Administered 2014-09-08: 2 mg via INTRAMUSCULAR

## 2014-09-08 NOTE — ED Notes (Addendum)
Pt. Is very calm and cooperative. Pt. was given a combination of medication from Nurse Shon BatonBrooks which has been a big help and difference in Pt. behavior. Pt. Is alert and oriented watching tv at the moment waiting on doctor to come in talk to him. He is stating he would like to go home that he has been here to long.

## 2014-09-08 NOTE — ED Notes (Signed)
Patient continually yelling out "Get out my house! Dumbass bitch!" loudly, without provocation. Pt unable to be redirected. Prn medication given for agitation as ordered. No distress noted. Sitter remains as bedside for safety.

## 2014-09-08 NOTE — ED Notes (Signed)
Medication not effective; pt remains labile and agitated. Sitter at bedside.

## 2014-09-08 NOTE — ED Notes (Addendum)
Pt went outside for 10 mins with Nurse, Tech, and Security. Pt. did well and was cooperative.

## 2014-09-08 NOTE — ED Notes (Signed)
Patient incontinent of urine. Patient cleaned; new brief and scrubs placed on patient. Fresh linens placed on the bed. Pt spitting, attempting to punch staff, and continually yelling and cursing staff during care. Pt demanding cigarettes, while at the same time yelling "Get the fuck outta my house; go home dumbass bitch, fuck you!". Pt disrupting entire unit and is unable to be redirected.

## 2014-09-08 NOTE — Progress Notes (Signed)
Shift note: pt has been agitated and anxious most of the am. He has been swinging at staff, attempting to throw furniture and spitting.  He has been given haldol 2 mg inj at 0804, haldol 2 mg inj at 1235, ativan 1mg  inj at 0802 and  ativan 2mg  injection at 1259. His agitation and anxiety seem to be addressed better when he is given the ativan with the haldol. Pt is resting comfortable now and watching TV. He continue to have a sitter with him. Staff will continue to monitor this patient.

## 2014-09-09 MED ORDER — BENZTROPINE MESYLATE 1 MG PO TABS
1.0000 mg | ORAL_TABLET | Freq: Every day | ORAL | Status: DC
  Administered 2014-09-09 – 2014-09-20 (×12): 1 mg via ORAL
  Filled 2014-09-09 (×12): qty 1

## 2014-09-09 MED ORDER — LORAZEPAM 2 MG/ML IJ SOLN
2.0000 mg | Freq: Once | INTRAMUSCULAR | Status: AC
  Administered 2014-09-09: 2 mg via INTRAMUSCULAR
  Filled 2014-09-09: qty 1

## 2014-09-09 NOTE — ED Notes (Signed)
Pt awake, was given bedtime meds a sandwich and water. Pt brief and linens changed.

## 2014-09-09 NOTE — ED Notes (Signed)
Patient is resting comfortably. Breathing WNL.  
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
## 2014-09-09 NOTE — Progress Notes (Signed)
mht note; rn scribed mht stated pt ate 100% of his breakfast and is taking in his fluids (480cc). He was incontinent of urine and was changed x1.

## 2014-09-09 NOTE — ED Notes (Signed)
Pt is sleeping. Pt has Respirations 12 with adequate rise and fall of chest.

## 2014-09-09 NOTE — ED Notes (Signed)
Pt yelling for Kizie.

## 2014-09-09 NOTE — Progress Notes (Signed)
CSW faxed [facility] updated nursing notes specifying the recent aggression from the patient with the intent to put on a prioritized wait list.  CSW called and spoke with Loetta RoughLei intake staff who reports because the patient would need a medical bed there is no prioritized wait list.  She reports that the patient high on the wait list so placement would not take much longer.     Maryelizabeth Rowanressa Kristapher [place], MSW, LCSWA Evening Clinical Social Worker [phone]

## 2014-09-10 ENCOUNTER — Encounter (HOSPITAL_COMMUNITY): Payer: Self-pay | Admitting: Psychiatry

## 2014-09-10 NOTE — ED Notes (Signed)
Pt cussing at nurse, not wanting to stay in bed, pt instructed to stay in bed, pt given sprite

## 2014-09-10 NOTE — ED Notes (Signed)
Pt slept well majority of the night. Took night time meds with no problem. Pt did not eat dinner last night due to being asleep and was given two sandwiches once awake. Pt is currently asleep and voices no complaints at this time.

## 2014-09-10 NOTE — Consult Note (Signed)
[facility]   Reason for referral:  Agitation  Referring Physician:  EDP  Cory [name] is an 33 y.o. male. Total Time spent with patient: 30 minutes  Assessment: AXIS I:  Cognitive Deficits, Traumatic Brain Injury AXIS II:  Deferred AXIS III:   Past Medical History  Diagnosis Date  . Depression     Pt was planning to start taking abilify 10/20  . Traumatic brain injury   . Seizures   . Bipolar 1 disorder    AXIS IV:  housing problems, other psychosocial or environmental problems, problems related to social environment and problems with primary support group AXIS V:  Serious symptoms  Plan:  Ad[facility]; patient remains labile with mood and emotions, aggressive at times--verbally and physically."   Yells obscenities at staff and anyone who walks past his room. No SI/HI  No Auditory or visual hallucinations   Subjective:   HPI:  Patient has increased agitation with yelling, cursing, and spitting on staff.  PRN Haldol given with some relief.   Awaiting [facility].   Review of Systems  HENT: Negative for congestion.   Respiratory: Negative for cough, shortness of breath and wheezing.   Gastrointestinal: Negative for nausea, vomiting, abdominal pain, diarrhea and constipation.       Incont.  bowel   Genitourinary:       Incont. bladder   Neurological: Positive for seizures. Negative for dizziness, tremors and headaches.  Psychiatric/Behavioral: Negative for depression, suicidal ideas, hallucinations and memory loss. The patient is not nervous/anxious and does not have insomnia.   All other systems reviewed and are negative.   Past Psychiatric History: Past Medical History  Diagnosis Date  . Depression     Pt was planning to start taking abilify 10/20  . Traumatic brain injury   . Seizures   . Bipolar 1 disorder     reports that he has quit smoking. He does not have any smokeless tobacco history on file. He  reports that he does not drink alcohol or use illicit drugs. History reviewed. No pertinent family history.         Allergies:  No Known Allergies  ACT Assessment Complete:  Yes:    Educational Status    Risk to Self: Risk to self with the past 6 months Is patient at risk for suicide?: No Substance abuse history and/or treatment for substance abuse?: No  Risk to Others:    Abuse:    Prior Inpatient Therapy:    Prior Outpatient Therapy:    Additional Information:          Objective: Blood pressure 134/83, pulse 111, temperature 98.6 F (37 C), temperature source Oral, resp. rate 20, weight 69.083 kg (152 lb 4.8 oz), SpO2 99 %.Body mass index is 26.99 kg/(m^2). No results found for this or any previous visit (from the past 72 hour(s)). Labs are reviewed see above values.  Medications reviewed and no changes made.  Current Facility-Administered Medications  Medication Dose Route Frequency Provider Last Rate Last Dose  . acetaminophen (TYLENOL) tablet 650 mg  650 mg Oral Q6H PRN Arman FilterGail K Schulz, NP      . alum & mag hydroxide-simeth (MAALOX/MYLANTA) 200-200-20 MG/5ML suspension 30 mL  30 mL Oral PRN [name], PA-C      . benztropine (COGENTIN) tablet 1 mg  1 mg Oral Daily Craige CottaFernando A Cobos, MD   1 mg at 09/10/14 0917  . divalproex (DEPAKOTE) DR tablet 500 mg  500 mg Oral Daily Nanine Means, NP   500 mg at 09/10/14 0748  . divalproex (DEPAKOTE) DR tablet 750 mg  750 mg Oral Daily Nanine Means, NP   750 mg at 09/10/14 1650  . haloperidol lactate (HALDOL) injection 2 mg  2 mg Intramuscular Q2H PRN Kerry Hough, PA-C   2 mg at 09/10/14 1029  . ibuprofen (ADVIL,MOTRIN) tablet 600 mg  600 mg Oral Q6H PRN Arman Filter, NP      . nicotine (NICODERM CQ - dosed in mg/24 hours) patch 21 mg  21 mg Transdermal Daily Fayrene Helper, PA-C   21 mg at 09/07/14 1610  . nicotine polacrilex (NICORETTE) gum 2 mg  2 mg Oral PRN Court Joy, PA-C   2 mg at 08/19/14 0535  . OLANZapine zydis (ZYPREXA)  disintegrating tablet 10 mg  10 mg Oral QHS Lyanne Co, MD   10 mg at 09/09/14 2306  . ondansetron (ZOFRAN) tablet 4 mg  4 mg Oral Q8H PRN Fayrene Helper, PA-C      . ziprasidone (GEODON) capsule 40 mg  40 mg Oral QPC supper Shavontae Gibeault   40 mg at 09/10/14 1901   Current Outpatient Prescriptions  Medication Sig Dispense Refill  . acetaminophen (TYLENOL) 325 MG tablet Take 650 mg by mouth every 6 (six) hours as needed for moderate pain.    . divalproex (DEPAKOTE) 500 MG DR tablet Take 1 tablet (500 mg total) by mouth every 12 (twelve) hours. 60 tablet 0  . escitalopram (LEXAPRO) 10 MG tablet Take 1 tablet (10 mg total) by mouth daily. 30 tablet 3  . ibuprofen (ADVIL,MOTRIN) 600 MG tablet Take 600 mg by mouth every 6 (six) hours as needed for moderate pain.    Marland Kitchen LORazepam (ATIVAN) 1 MG tablet Take 1 tablet (1 mg total) by mouth every 8 (eight) hours as needed for anxiety (agitation). 30 tablet 0  . multivitamin-iron-minerals-folic acid (CENTRUM) chewable tablet Chew 1 tablet by mouth daily.    . QUEtiapine (SEROQUEL) 50 MG tablet Take 1 tablet (50 mg total) by mouth 4 (four) times daily. 150 tablet 0  . traZODone (DESYREL) 100 MG tablet Take 1 tablet (100 mg total) by mouth at bedtime.      Psychiatric Specialty Exam:     Blood pressure 134/83, pulse 111, temperature 98.6 F (37 C), temperature source Oral, resp. rate 20, weight 69.083 kg (152 lb 4.8 oz), SpO2 99 %.Body mass index is 26.99 kg/(m^2).  General Appearance: Casual  Eye Contact::  Fair  Speech:  Slurred  Volume:  Normal  Mood:  Irritable  Affect:  Blunt  Thought Process:  Coherent  Orientation:  Other:  Person and place  Thought Content:  "I don't like it here" Shut up, F*&% you, go away"  Suicidal Thoughts:  No  Homicidal Thoughts:  No  Memory:  Immediate;   Poor Recent;   Poor Remote;   Poor  Judgement:  Impaired  Insight:  Lacking  Psychomotor Activity:  Normal  Concentration:  Poor  Recall:  Poor  Fund of  Knowledge:Poor  Language: Poor  Akathisia:  No  Handed:  Right  AIMS (if indicated):     Assets:  Social Support  Sleep:      Musculoskeletal: Strength & Muscle Tone: within normal limits Gait & Station: unsteady, shuffle Patient leans: Front  Treatment Plan Summary: Admit to inpatient psychiatry for stability of mood, place on [facility] wait list for violence.  Bonnetta Barry, PMH-NP 09/10/2014 7:07  PM   Patient seen, evaluated and I agree with notes by Nurse Practitioner. Thedore MinsMojeed Zoya Sprecher, MD

## 2014-09-10 NOTE — ED Notes (Signed)
Pt awake yelling outside of room.

## 2014-09-10 NOTE — ED Notes (Addendum)
Took pt for a ride though out the hospital and then out side and sat for about 10 mins. Pt did great was nice to everyone. He was glad to get out of his room for a while. Pt was told that if he at anytime was mean to anyone that he was to return to his room for the rest of the night. Pt also walked about 50 ft

## 2014-09-10 NOTE — ED Notes (Signed)
Pt watching tv at this time, attempting to get out of bed multiple times, redirected in bed, NT at bedside at this time talking w/ pt.

## 2014-09-10 NOTE — ED Notes (Signed)
Pt taken by wheelchair by NT/sitter off the unit. Will obtain vitals upon pt's return.

## 2014-09-10 NOTE — ED Notes (Addendum)
Pt to shower. NT as standby assist.

## 2014-09-10 NOTE — ED Notes (Signed)
Pt continuing to become more and more agitated. Yelling and cursing at staff, attempted to knock meds out of RNs hand and punch RN. Pt responds well to specific sitter that is at bedside that can help deescalate pt for a very short time. Will medicate pt with IM Haldol.

## 2014-09-10 NOTE — ED Notes (Signed)
Pt becoming more aggressive throughout the morning. Attempting to hit staff. Pulled back finger of this RN during medication administration trying to prevent taking meds. Pt becoming louder, cursing staff, continuing to threaten staff and raising fists to them. Pt medicated with Haldol IM without incidence, given sandwich and coke at same time. Will continue to monitor. Sitter remains at bedside.

## 2014-09-10 NOTE — ED Provider Notes (Signed)
Pt awaiting placement  Cory [name] Cai, MD 09/10/14 [phone]

## 2014-09-10 NOTE — Progress Notes (Signed)
CSW [facility] clinical[facility] continued [facility].   Byrd HesselbachKristen Deundra Bard, LCSW [phone]  ED CSW 09/10/2014 1216pm

## 2014-09-10 NOTE — ED Notes (Signed)
Pt giving candy M&M

## 2014-09-10 NOTE — [facility] Counselor (Signed)
[facility] still on [facility].  Evette Cristalaroline Paige Lylith Bebeau, [facility] Assessment Counselor

## 2014-09-10 NOTE — ED Notes (Signed)
Cleaned pt ears(very dirty) needs to be flushed by DR.

## 2014-09-10 NOTE — ED Notes (Signed)
Pt giving a drink and a ham sandwich

## 2014-09-11 ENCOUNTER — Encounter (HOSPITAL_COMMUNITY): Payer: Self-pay | Admitting: Registered Nurse

## 2014-09-11 NOTE — ED Notes (Signed)
Pt. Had bowel movement. Pt was cleaned up and changed.

## 2014-09-11 NOTE — Progress Notes (Signed)
Per Junious Dresseronnie, [facility]. Per Junious Dresseronnie no movement on list today.   Byrd HesselbachKristen Jaeshaun Riva, LCSW [phone]  ED CSW 09/11/2014 8:17am

## 2014-09-11 NOTE — ED Notes (Signed)
Pt having to be held down by security to give shot, pt hitting and spitting at staff.

## 2014-09-11 NOTE — ED Notes (Addendum)
Pt threw his cup with ice in it into hallway. Pt agitated throwing pillow in room. Pacing in room.

## 2014-09-11 NOTE — Consult Note (Signed)
[facility] Follow UP Psychiatry Consult   Reason for referral:  Agitation  Referring Physician:  EDP  Cory [name] is an 33 y.o. male. Total Time spent with patient: 30 minutes  Assessment: AXIS I:  Cognitive Deficits, Traumatic Brain Injury AXIS II:  Deferred AXIS III:   Past Medical History  Diagnosis Date  . Depression     Pt was planning to start taking abilify 10/20  . Traumatic brain injury   . Seizures   . Bipolar 1 disorder    AXIS IV:  housing problems, other psychosocial or environmental problems, problems related to social environment and problems with primary support group AXIS V:  Serious symptoms  Plan:  Admit to inpatient psychiatry for stabilization at [facility]; patient remains labile with mood and emotions, aggressive at times--verbally and physically."   Yells obscenities at staff and anyone who walks past his room. No SI/HI  No Auditory or visual hallucinations   Subjective:   HPI:  Patient continues to get easily agitated with certain staff.  Patient continues to respond better to women than men.  Will continue with [facility] wait list for inpatient stabilization of mood.  Review of Systems  HENT: Negative for congestion.   Respiratory: Negative for cough, shortness of breath and wheezing.   Gastrointestinal: Negative for nausea, vomiting, abdominal pain, diarrhea and constipation.       Incont.  bowel   Genitourinary:       Incont. bladder   Neurological: Positive for seizures. Negative for dizziness, tremors and headaches.  Psychiatric/Behavioral: Negative for depression, suicidal ideas, hallucinations and memory loss. The patient is not nervous/anxious and does not have insomnia.   All other systems reviewed and are negative.   Past Psychiatric History: Past Medical History  Diagnosis Date  . Depression     Pt was planning to start taking abilify 10/20  . Traumatic brain injury   . Seizures   . Bipolar 1 disorder     reports that he has quit smoking. He  does not have any smokeless tobacco history on file. He reports that he does not drink alcohol or use illicit drugs. History reviewed. No pertinent family history.         Allergies:  No Known Allergies  ACT Assessment Complete:  Yes:    Educational Status    Risk to Self: Risk to self with the past 6 months Is patient at risk for suicide?: No Substance abuse history and/or treatment for substance abuse?: No  Risk to Others:    Abuse:    Prior Inpatient Therapy:    Prior Outpatient Therapy:    Additional Information:          Objective: Blood pressure 126/77, pulse 86, temperature 98 F (36.7 C), temperature source Oral, resp. rate 16, weight 69.083 kg (152 lb 4.8 oz), SpO2 98 %.Body mass index is 26.99 kg/(m^2). No results found for this or any previous visit (from the past 72 hour(s)). Labs are reviewed see above values.  Medications reviewed and no changes made.  Current Facility-Administered Medications  Medication Dose Route Frequency Provider Last Rate Last Dose  . acetaminophen (TYLENOL) tablet 650 mg  650 mg Oral Q6H PRN Arman Filter, NP      . alum & mag hydroxide-simeth (MAALOX/MYLANTA) 200-200-20 MG/5ML suspension 30 mL  30 mL Oral PRN Fayrene Helper, PA-C      . benztropine (COGENTIN) tablet 1 mg  1 mg Oral Daily Craige Cotta, MD   1 mg at  09/11/14 0934  . divalproex (DEPAKOTE) DR tablet 500 mg  500 mg Oral Daily [name], NP   500 mg at 09/11/14 0934  . divalproex (DEPAKOTE) DR tablet 750 mg  750 mg Oral Daily [name], NP   750 mg at 09/10/14 1650  . haloperidol lactate (HALDOL) injection 2 mg  2 mg Intramuscular Q2H PRN Kerry HoughSpencer E Simon, PA-C   2 mg at 09/11/14 0531  . ibuprofen (ADVIL,MOTRIN) tablet 600 mg  600 mg Oral Q6H PRN Arman FilterGail K Schulz, NP      . nicotine (NICODERM CQ - dosed in mg/24 hours) patch 21 mg  21 mg Transdermal Daily [name], PA-C   21 mg at 09/07/14 [id number]  . nicotine polacrilex (NICORETTE) gum 2 mg  2 mg Oral PRN [name], PA-C    2 mg at 08/19/14 0535  . OLANZapine zydis (ZYPREXA) disintegrating tablet 10 mg  10 mg Oral QHS Lyanne CoKevin M Campos, MD   10 mg at 09/09/14 2306  . ondansetron (ZOFRAN) tablet 4 mg  4 mg Oral Q8H PRN [name], PA-C      . ziprasidone (GEODON) capsule 40 mg  40 mg Oral QPC supper Chantal Worthey   40 mg at 09/10/14 1901   Current Outpatient Prescriptions  Medication Sig Dispense Refill  . acetaminophen (TYLENOL) 325 MG tablet Take 650 mg by mouth every 6 (six) hours as needed for moderate pain.    . divalproex (DEPAKOTE) 500 MG DR tablet Take 1 tablet (500 mg total) by mouth every 12 (twelve) hours. 60 tablet 0  . escitalopram (LEXAPRO) 10 MG tablet Take 1 tablet (10 mg total) by mouth daily. 30 tablet 3  . ibuprofen (ADVIL,MOTRIN) 600 MG tablet Take 600 mg by mouth every 6 (six) hours as needed for moderate pain.    Marland Kitchen. LORazepam (ATIVAN) 1 MG tablet Take 1 tablet (1 mg total) by mouth every 8 (eight) hours as needed for anxiety (agitation). 30 tablet 0  . multivitamin-iron-minerals-folic acid (CENTRUM) chewable tablet Chew 1 tablet by mouth daily.    . QUEtiapine (SEROQUEL) 50 MG tablet Take 1 tablet (50 mg total) by mouth 4 (four) times daily. 150 tablet 0  . traZODone (DESYREL) 100 MG tablet Take 1 tablet (100 mg total) by mouth at bedtime.      Psychiatric Specialty Exam:     Blood pressure 126/77, pulse 86, temperature 98 F (36.7 C), temperature source Oral, resp. rate 16, weight 69.083 kg (152 lb 4.8 oz), SpO2 98 %.Body mass index is 26.99 kg/(m^2).  General Appearance: Casual  Eye Contact::  Fair  Speech:  Slurred  Volume:  Normal  Mood:  Irritable  Affect:  Blunt  Thought Process:  Coherent  Orientation:  Other:  Person and place  Thought Content:  "I don't like it here" Get out; leave me alone"  Suicidal Thoughts:  No  Homicidal Thoughts:  No  Memory:  Immediate;   Poor Recent;   Poor Remote;   Poor  Judgement:  Impaired  Insight:  Lacking  Psychomotor Activity:  Normal   Concentration:  Poor  Recall:  Poor  Fund of Knowledge:Poor  Language: Poor  Akathisia:  No  Handed:  Right  AIMS (if indicated):     Assets:  Social Support  Sleep:      Musculoskeletal: Strength & Muscle Tone: within normal limits Gait & Station: unsteady, shuffle Patient leans: Front  Treatment Plan Summary: Admit to inpatient psychiatry for stability of mood[facility]   Continue with current treatment plan  [name], [name], FNP-BC  09/11/2014 1:56 PM   Patient seen, evaluated and I agree with notes by Nurse Practitioner. Thedore MinsMojeed Angelyn Osterberg, MD

## 2014-09-11 NOTE — ED Notes (Signed)
Pt. Given sandwich and Coke to drink 100% eaten.

## 2014-09-11 NOTE — ED Notes (Signed)
Pt  Given sandwich ate 100%

## 2014-09-11 NOTE — ED Notes (Signed)
Pt. Throwing chewing gum. It stuck on the the nursing station.

## 2014-09-11 NOTE — ED Notes (Signed)
Pt awake now, yelling and cussing at staff.

## 2014-09-12 ENCOUNTER — Encounter (HOSPITAL_COMMUNITY): Payer: Self-pay | Admitting: Registered Nurse

## 2014-09-12 NOTE — ED Notes (Signed)
Pt Bed wet. Full linen change and Pt. Was given a shower.

## 2014-09-12 NOTE — ED Notes (Signed)
Check pt for incontinence pt is dry.

## 2014-09-12 NOTE — ED Notes (Signed)
Pt ate 100% of sandwich.

## 2014-09-12 NOTE — ED Notes (Signed)
Changed pt at the end of shit and got vitals on pt. He continued to try to hit and spit on Tech.

## 2014-09-12 NOTE — ED Notes (Signed)
Pt. Eating a sandwich drinking coke

## 2014-09-12 NOTE — ED Notes (Signed)
Oshae medicated for repeated profanity gesturing to assault staff and spitting at staff with 2 mg PRN haldol IM .  He made multiple request for cake and when staff gave him cake he was thankful until he had eaten cake the told that staff person " F__k you I punch your face". He then threw cup of water at nurse station and remains verbally aggressive. Zachery remain passive aggressive and verbally abusive toward all who approach or interacts with him.

## 2014-09-12 NOTE — ED Notes (Signed)
We changed him and cleaned him up

## 2014-09-12 NOTE — ED Notes (Signed)
Pt. Literally spit on Tech on purpose. He cough it up with food particles in it. After he spit pt. Called me a dumb bitch.

## 2014-09-12 NOTE — Consult Note (Signed)
[facility]   Reason for referral:  Agitation  Referring Physician:  EDP  Cory [name] is an 33 y.o. male. Total Time spent with patient: 15 minutes  Assessment: AXIS I:  Cognitive Deficits, Traumatic Brain Injury AXIS II:  Deferred AXIS III:   Past Medical History  Diagnosis Date  . Depression     Pt was planning to start taking abilify 10/20  . Traumatic brain injury   . Seizures   . Bipolar 1 disorder    AXIS IV:  housing problems, other psychosocial or environmental problems, problems related to social environment and problems with primary support group AXIS V:  Serious symptoms  Plan:  [facility]; patient remains labile with mood and emotions, aggressive at times--verbally and physically."   Yells obscenities at staff and anyone who walks past his room. No SI/HI  No Auditory or visual hallucinations   Subjective:   HPI:  Patient continues to need in patient stabilization for mood.  Patient continues to get up set and curse when a man passes his room.  Patient gets agitated easily.     Review of Systems  Gastrointestinal:       Incont.  bowel   Genitourinary:       Incont. bladder   Neurological: Positive for seizures.  Psychiatric/Behavioral: Negative for depression, suicidal ideas, hallucinations and memory loss. The patient is not nervous/anxious and does not have insomnia.   All other systems reviewed and are negative.   Past Psychiatric History: Past Medical History  Diagnosis Date  . Depression     Pt was planning to start taking abilify 10/20  . Traumatic brain injury   . Seizures   . Bipolar 1 disorder     reports that he has quit smoking. He does not have any smokeless tobacco history on file. He reports that he does not drink alcohol or use illicit drugs. History reviewed. No pertinent family history.         Allergies:  No Known Allergies  ACT Assessment Complete:  Yes:     Educational Status    Risk to Self: Risk to self with the past 6 months Is patient at risk for suicide?: No Substance abuse history and/or treatment for substance abuse?: No  Risk to Others:    Abuse:    Prior Inpatient Therapy:    Prior Outpatient Therapy:    Additional Information:          Objective: Blood pressure 115/78, pulse 76, temperature 98.2 F (36.8 C), temperature source Oral, resp. rate 17, weight 69.083 kg (152 lb 4.8 oz), SpO2 99 %.Body mass index is 26.99 kg/(m^2). No results found for this or any previous visit (from the past 72 hour(s)). Labs are reviewed see above values.  Medications reviewed and no changes made.  Current Facility-Administered Medications  Medication Dose Route Frequency Provider Last Rate Last Dose  . acetaminophen (TYLENOL) tablet 650 mg  650 mg Oral Q6H PRN Arman FilterGail K Schulz, NP      . alum & mag hydroxide-simeth (MAALOX/MYLANTA) 200-200-20 MG/5ML suspension 30 mL  30 mL Oral PRN [name], PA-C      . benztropine (COGENTIN) tablet 1 mg  1 mg Oral Daily Craige CottaFernando A Cobos, MD   1 mg at 09/12/14 1105  . divalproex (DEPAKOTE) DR tablet 500 mg  500 mg Oral Daily [name], NP   500 mg at 09/12/14 0805  . divalproex (DEPAKOTE) DR  tablet 750 mg  750 mg Oral Daily [name], NP   750 mg at 09/12/14 1631  . haloperidol lactate (HALDOL) injection 2 mg  2 mg Intramuscular Q2H PRN Kerry HoughSpencer E Simon, PA-C   2 mg at 09/12/14 0805  . ibuprofen (ADVIL,MOTRIN) tablet 600 mg  600 mg Oral Q6H PRN Arman FilterGail K Schulz, NP      . nicotine (NICODERM CQ - dosed in mg/24 hours) patch 21 mg  21 mg Transdermal Daily [name], PA-C   21 mg at 09/07/14 [id number]  . nicotine polacrilex (NICORETTE) gum 2 mg  2 mg Oral PRN [name], PA-C   2 mg at 08/19/14 0535  . OLANZapine zydis (ZYPREXA) disintegrating tablet 10 mg  10 mg Oral QHS Lyanne CoKevin M Campos, MD   10 mg at 09/12/14 0047  . ondansetron (ZOFRAN) tablet 4 mg  4 mg Oral Q8H PRN [name], PA-C      . ziprasidone  (GEODON) capsule 40 mg  40 mg Oral QPC supper Misako Roeder   40 mg at 09/11/14 1732   Current Outpatient Prescriptions  Medication Sig Dispense Refill  . acetaminophen (TYLENOL) 325 MG tablet Take 650 mg by mouth every 6 (six) hours as needed for moderate pain.    . divalproex (DEPAKOTE) 500 MG DR tablet Take 1 tablet (500 mg total) by mouth every 12 (twelve) hours. 60 tablet 0  . escitalopram (LEXAPRO) 10 MG tablet Take 1 tablet (10 mg total) by mouth daily. 30 tablet 3  . ibuprofen (ADVIL,MOTRIN) 600 MG tablet Take 600 mg by mouth every 6 (six) hours as needed for moderate pain.    Marland Kitchen. LORazepam (ATIVAN) 1 MG tablet Take 1 tablet (1 mg total) by mouth every 8 (eight) hours as needed for anxiety (agitation). 30 tablet 0  . multivitamin-iron-minerals-folic acid (CENTRUM) chewable tablet Chew 1 tablet by mouth daily.    . QUEtiapine (SEROQUEL) 50 MG tablet Take 1 tablet (50 mg total) by mouth 4 (four) times daily. 150 tablet 0  . traZODone (DESYREL) 100 MG tablet Take 1 tablet (100 mg total) by mouth at bedtime.      Psychiatric Specialty Exam:     Blood pressure 115/78, pulse 76, temperature 98.2 F (36.8 C), temperature source Oral, resp. rate 17, weight 69.083 kg (152 lb 4.8 oz), SpO2 99 %.Body mass index is 26.99 kg/(m^2).  General Appearance: Casual  Eye Contact::  Fair  Speech:  Slurred  Volume:  Normal  Mood:  Irritable  Affect:  Blunt  Thought Process:  Coherent  Orientation:  Other:  Person and place  Thought Content:  "I don't like it here" Get out; leave me alone"  Suicidal Thoughts:  No  Homicidal Thoughts:  No  Memory:  Immediate;   Poor Recent;   Poor Remote;   Poor  Judgement:  Impaired  Insight:  Lacking  Psychomotor Activity:  Normal  Concentration:  Poor  Recall:  Poor  Fund of Knowledge:Poor  Language: Poor  Akathisia:  No  Handed:  Right  AIMS (if indicated):     Assets:  Social Support  Sleep:      Musculoskeletal: Strength & Muscle Tone: within  normal limits Gait & Station: unsteady, shuffle Patient leans: Front  Treatment Plan Summary: Admit to inpatient psychiatry for stability of mood[facility]   Continue with current treatment plan.  [facility]    [name], [name], FNP-BC  09/12/2014 5:15 PM  Patient seen, evaluated and I agree with notes by Nurse Practitioner. Laquiesha Piacente, MD 

## 2014-09-12 NOTE — ED Notes (Signed)
Family at bedside. 

## 2014-09-12 NOTE — ED Notes (Signed)
Pt. Threw his bedside table into the door. Tried to spit on sitter. Then began to be verbally aggressive.

## 2014-09-12 NOTE — ED Notes (Signed)
Pt is sleeping will give him his lunch when he awakes

## 2014-09-12 NOTE — Progress Notes (Signed)
CS[facility], pt remains on wait[facility] and no movement today.   Byrd HesselbachKristen Amritpal Shropshire, LCSW [phone]  ED CSW 09/12/2014 1043am

## 2014-09-13 NOTE — ED Notes (Signed)
Pt
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
## 2014-09-13 NOTE — ED Notes (Signed)
Pt ambulated to bathroom 

## 2014-09-13 NOTE — ED Notes (Signed)
Pt. Ate 100% of his meal.

## 2014-09-13 NOTE — ED Notes (Signed)
Pt. Was in the doorway cursing. Told patient to be quiet. I was then told by pt. to suck his dick.

## 2014-09-13 NOTE — ED Notes (Signed)
Sitting up on side of bed eating crackers and drinking diet coke. Pt requesting something to eat.

## 2014-09-13 NOTE — ED Notes (Addendum)
Pt. Is dry and clean bed linen is change Pt was using profanity to staff and trying to hit and spit on staff

## 2014-09-13 NOTE — Progress Notes (Signed)
CSW spoke with Junious Dresseronnie, [facility].   Byrd HesselbachKristen Bernarr Longsworth, LCSW [phone]  ED CSW 09/13/2014 845am

## 2014-09-13 NOTE — Consult Note (Signed)
[facility]   Reason for referral:  Agitation  Referring Physician:  EDP  Pablo Lawrenceykwan Juhnke is an 33 y.o. male. Total Time spent with patient: 15 minutes  Assessment: AXIS I:  Cognitive Deficits, Traumatic Brain Injury AXIS II:  Deferred AXIS III:   Past Medical History  Diagnosis Date  . Depression     Pt was planning to start taking abilify 10/20  . Traumatic brain injury   . Seizures   . Bipolar 1 disorder    AXIS IV:  housing problems, other psychosocial or environmental problems, problems related to social environment and problems with primary support group AXIS V:  Serious symptoms  Plan:  [facility]; patient remains labile with mood and emotions, aggressive at times--verbally and physically."   Yells obscenities at staff and anyone who walks past his room. No SI/HI  No Auditory or visual hallucinations   Subjective:   HPI:  Patient has had several outburst today hitting staff and cursing out loud.      Review of Systems  Gastrointestinal:       Incont.  bowel   Genitourinary:       Incont. bladder   Neurological: Positive for seizures.  Psychiatric/Behavioral: Negative for depression, suicidal ideas, hallucinations and memory loss. The patient is not nervous/anxious and does not have insomnia.   All other systems reviewed and are negative.   Past Psychiatric History: Past Medical History  Diagnosis Date  . Depression     Pt was planning to start taking abilify 10/20  . Traumatic brain injury   . Seizures   . Bipolar 1 disorder     reports that he has quit smoking. He does not have any smokeless tobacco history on file. He reports that he does not drink alcohol or use illicit drugs. History reviewed. No pertinent family history.         Allergies:  No Known Allergies  ACT Assessment Complete:  Yes:    Educational Status    Risk to Self: Risk to self with the past 6 months Is patient at risk  for suicide?: No Substance abuse history and/or treatment for substance abuse?: No  Risk to Others:    Abuse:    Prior Inpatient Therapy:    Prior Outpatient Therapy:    Additional Information:          Objective: Blood pressure 116/72, pulse 96, temperature 98.5 F (36.9 C), temperature source Oral, resp. rate 16, weight 69.083 kg (152 lb 4.8 oz), SpO2 98 %.Body mass index is 26.99 kg/(m^2). No results found for this or any previous visit (from the past 72 hour(s)). Labs are reviewed see above values.  Medications reviewed and no changes made.  Current Facility-Administered Medications  Medication Dose Route Frequency Provider Last Rate Last Dose  . acetaminophen (TYLENOL) tablet 650 mg  650 mg Oral Q6H PRN Arman FilterGail K Schulz, NP      . alum & mag hydroxide-simeth (MAALOX/MYLANTA) 200-200-20 MG/5ML suspension 30 mL  30 mL Oral PRN [name], PA-C      . benztropine (COGENTIN) tablet 1 mg  1 mg Oral Daily Craige CottaFernando A Cobos, MD   1 mg at 09/13/14 0951  . divalproex (DEPAKOTE) DR tablet 500 mg  500 mg Oral Daily [name], NP   500 mg at 09/13/14 0805  . divalproex (DEPAKOTE) DR tablet 750 mg  750 mg Oral Daily [name], NP   750 mg at 09/12/14 1631  .  haloperidol lactate (HALDOL) injection 2 mg  2 mg Intramuscular Q2H PRN Kerry HoughSpencer E Simon, PA-C   2 mg at 09/13/14 [id number]  . ibuprofen (ADVIL,MOTRIN) tablet 600 mg  600 mg Oral Q6H PRN Arman FilterGail K Schulz, NP      . nicotine (NICODERM CQ - dosed in mg/24 hours) patch 21 mg  21 mg Transdermal Daily [name], PA-C   21 mg at 09/13/14 0951  . nicotine polacrilex (NICORETTE) gum 2 mg  2 mg Oral PRN [name], PA-C   2 mg at 08/19/14 0535  . OLANZapine zydis (ZYPREXA) disintegrating tablet 10 mg  10 mg Oral QHS Lyanne CoKevin M Campos, MD   10 mg at 09/12/14 2210  . ondansetron (ZOFRAN) tablet 4 mg  4 mg Oral Q8H PRN [name], PA-C      . ziprasidone (GEODON) capsule 40 mg  40 mg Oral QPC supper Isyss Espinal   40 mg at 09/12/14 1732   Current  Outpatient Prescriptions  Medication Sig Dispense Refill  . acetaminophen (TYLENOL) 325 MG tablet Take 650 mg by mouth every 6 (six) hours as needed for moderate pain.    . divalproex (DEPAKOTE) 500 MG DR tablet Take 1 tablet (500 mg total) by mouth every 12 (twelve) hours. 60 tablet 0  . escitalopram (LEXAPRO) 10 MG tablet Take 1 tablet (10 mg total) by mouth daily. 30 tablet 3  . ibuprofen (ADVIL,MOTRIN) 600 MG tablet Take 600 mg by mouth every 6 (six) hours as needed for moderate pain.    Marland Kitchen. LORazepam (ATIVAN) 1 MG tablet Take 1 tablet (1 mg total) by mouth every 8 (eight) hours as needed for anxiety (agitation). 30 tablet 0  . multivitamin-iron-minerals-folic acid (CENTRUM) chewable tablet Chew 1 tablet by mouth daily.    . QUEtiapine (SEROQUEL) 50 MG tablet Take 1 tablet (50 mg total) by mouth 4 (four) times daily. 150 tablet 0  . traZODone (DESYREL) 100 MG tablet Take 1 tablet (100 mg total) by mouth at bedtime.      Psychiatric Specialty Exam:     Blood pressure 116/72, pulse 96, temperature 98.5 F (36.9 C), temperature source Oral, resp. rate 16, weight 69.083 kg (152 lb 4.8 oz), SpO2 98 %.Body mass index is 26.99 kg/(m^2).  General Appearance: Casual  Eye Contact::  Fair  Speech:  Slurred  Volume:  Normal  Mood:  Irritable  Affect:  Blunt  Thought Process:  Coherent  Orientation:  Other:  Person and place  Thought Content:  "I don't like it here" Get out; leave me alone"  Suicidal Thoughts:  No  Homicidal Thoughts:  No  Memory:  Immediate;   Poor Recent;   Poor Remote;   Poor  Judgement:  Impaired  Insight:  Lacking  Psychomotor Activity:  Normal  Concentration:  Poor  Recall:  Poor  Fund of Knowledge:Poor  Language: Poor  Akathisia:  No  Handed:  Right  AIMS (if indicated):     Assets:  Social Support  Sleep:      Musculoskeletal: Strength & Muscle Tone: within normal limits Gait & Station: unsteady, shuffle Patient leans: Front  Treatment Plan  Summary: Admit to inpatient psychiatry for stability of mood[facility]   [facility]patient [facility].    [name], [name], FNP-BC  09/13/2014 3:52 PM   Patient seen, evaluated and I agree with notes by Nurse Practitioner. Thedore MinsMojeed Simrin Vegh, MD
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
## 2014-09-14 MED ORDER — DOCUSATE SODIUM 50 MG/5ML PO LIQD
100.0000 mg | Freq: Once | ORAL | Status: AC
  Administered 2014-09-14: 100 mg via ORAL
  Filled 2014-09-14: qty 10

## 2014-09-14 NOTE — ED Notes (Signed)
Patient's bed linen changed as he showered.

## 2014-09-14 NOTE — ED Notes (Signed)
Pt giving M&M and a coke

## 2014-09-14 NOTE — Progress Notes (Signed)
CS[facility], [facility].   Byrd HesselbachKristen Deegan Valentino, LCSW [phone]  ED CSW 09/14/2014 945am

## 2014-09-14 NOTE — ED Notes (Signed)
Pt went to sleep 

## 2014-09-14 NOTE — ED Notes (Signed)
I ask RN to flush pt ears they looked to be very dirty.
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
## 2014-09-14 NOTE — ED Notes (Signed)
Pt is watching cartoons and is clam at the moment.

## 2014-09-14 NOTE — ED Notes (Signed)
Pt asking for food, pt giving a sandwich

## 2014-09-14 NOTE — Consult Note (Signed)
[facility]   Reason for referral:  Agitation  Referring Physician:  EDP  Cory [name] is an 33 y.o. male. Total Time spent with patient: 30 minutes  Assessment: AXIS I:  Cognitive Deficits, Traumatic Brain Injury AXIS II:  Deferred AXIS III:   Past Medical History  Diagnosis Date  . Depression     Pt was planning to start taking abilify 10/20  . Traumatic brain injury   . Seizures   . Bipolar 1 disorder    AXIS IV:  housing problems, other psychosocial or environmental problems, problems related to social environment and problems with primary support group AXIS V:  Serious symptoms  Plan:  Admit to inpatient psychiatry for stabilization.    Subjective:   HPI:  Patient remains volatile with hitting staff at times, unprovoked and unpredictable.  He frequently hits staff, curses, and is agitated.  Awaiting [facility]. Review of Systems  HENT: Negative for congestion.   Respiratory: Negative for cough, shortness of breath and wheezing.   Gastrointestinal: Negative for nausea, vomiting, abdominal pain, diarrhea and constipation.       Incont.  bowel   Genitourinary:       Incont. bladder   Neurological: Positive for seizures. Negative for dizziness, tremors and headaches.  Psychiatric/Behavioral: Negative for depression, suicidal ideas, hallucinations and memory loss. The patient is not nervous/anxious and does not have insomnia.   All other systems reviewed and are negative.   Past Psychiatric History: Past Medical History  Diagnosis Date  . Depression     Pt was planning to start taking abilify 10/20  . Traumatic brain injury   . Seizures   . Bipolar 1 disorder     reports that he has quit smoking. He does not have any smokeless tobacco history on file. He reports that he does not drink alcohol or use illicit drugs. History reviewed. No pertinent family history.         Allergies:  No Known Allergies  ACT Assessment Complete:  Yes:     Educational Status    Risk to Self: Risk to self with the past 6 months Is patient at risk for suicide?: No Substance abuse history and/or treatment for substance abuse?: No  Risk to Others:    Abuse:    Prior Inpatient Therapy:    Prior Outpatient Therapy:    Additional Information:          Objective: Blood pressure 116/69, pulse 101, temperature 98.7 F (37.1 C), temperature source Oral, resp. rate 17, weight 152 lb 4.8 oz (69.083 kg), SpO2 98 %.Body mass index is 26.99 kg/(m^2). No results found for this or any previous visit (from the past 72 hour(s)). Labs are reviewed see above values.  Medications reviewed and no changes made.  Current Facility-Administered Medications  Medication Dose Route Frequency Provider Last Rate Last Dose  . acetaminophen (TYLENOL) tablet 650 mg  650 mg Oral Q6H PRN Arman FilterGail K Schulz, NP      . alum & mag hydroxide-simeth (MAALOX/MYLANTA) 200-200-20 MG/5ML suspension 30 mL  30 mL Oral PRN [name], PA-C      . benztropine (COGENTIN) tablet 1 mg  1 mg Oral Daily Craige CottaFernando A Cobos, MD   1 mg at 09/14/14 1027  . divalproex (DEPAKOTE) DR tablet 500 mg  500 mg Oral Daily [name], NP   500 mg at 09/14/14 0815  . divalproex (DEPAKOTE) DR tablet 750 mg  750 mg Oral Daily [name], NP   750 mg at  09/13/14 1617  . haloperidol lactate (HALDOL) injection 2 mg  2 mg Intramuscular Q2H PRN Kerry HoughSpencer E Simon, PA-C   2 mg at 09/13/14 [id number]  . ibuprofen (ADVIL,MOTRIN) tablet 600 mg  600 mg Oral Q6H PRN Arman FilterGail K Schulz, NP      . nicotine (NICODERM CQ - dosed in mg/24 hours) patch 21 mg  21 mg Transdermal Daily [name], PA-C   21 mg at 09/14/14 1105  . nicotine polacrilex (NICORETTE) gum 2 mg  2 mg Oral PRN [name], PA-C   2 mg at 08/19/14 0535  . OLANZapine zydis (ZYPREXA) disintegrating tablet 10 mg  10 mg Oral QHS Lyanne CoKevin M Campos, MD   10 mg at 09/13/14 2106  . ondansetron (ZOFRAN) tablet 4 mg  4 mg Oral Q8H PRN [name], PA-C      . ziprasidone  (GEODON) capsule 40 mg  40 mg Oral QPC supper Vieno Tarrant   40 mg at 09/13/14 1815   Current Outpatient Prescriptions  Medication Sig Dispense Refill  . acetaminophen (TYLENOL) 325 MG tablet Take 650 mg by mouth every 6 (six) hours as needed for moderate pain.    . divalproex (DEPAKOTE) 500 MG DR tablet Take 1 tablet (500 mg total) by mouth every 12 (twelve) hours. 60 tablet 0  . escitalopram (LEXAPRO) 10 MG tablet Take 1 tablet (10 mg total) by mouth daily. 30 tablet 3  . ibuprofen (ADVIL,MOTRIN) 600 MG tablet Take 600 mg by mouth every 6 (six) hours as needed for moderate pain.    Marland Kitchen. LORazepam (ATIVAN) 1 MG tablet Take 1 tablet (1 mg total) by mouth every 8 (eight) hours as needed for anxiety (agitation). 30 tablet 0  . multivitamin-iron-minerals-folic acid (CENTRUM) chewable tablet Chew 1 tablet by mouth daily.    . QUEtiapine (SEROQUEL) 50 MG tablet Take 1 tablet (50 mg total) by mouth 4 (four) times daily. 150 tablet 0  . traZODone (DESYREL) 100 MG tablet Take 1 tablet (100 mg total) by mouth at bedtime.      Psychiatric Specialty Exam:     Blood pressure 116/69, pulse 101, temperature 98.7 F (37.1 C), temperature source Oral, resp. rate 17, weight 152 lb 4.8 oz (69.083 kg), SpO2 98 %.Body mass index is 26.99 kg/(m^2).  General Appearance: Casual  Eye Contact::  Fair  Speech:  Slurred  Volume:  Normal  Mood:  Irritable  Affect:  Blunt  Thought Process:  Coherent  Orientation:  Other:  Person and place  Thought Content:  "I don't like it here" Shut up, F*&% you, go away"  Suicidal Thoughts:  No  Homicidal Thoughts:  No  Memory:  Immediate;   Poor Recent;   Poor Remote;   Poor  Judgement:  Impaired  Insight:  Lacking  Psychomotor Activity:  Normal  Concentration:  Poor  Recall:  Poor  Fund of Knowledge:Poor  Language: Poor  Akathisia:  No  Handed:  Right  AIMS (if indicated):     Assets:  Social Support  Sleep:      Musculoskeletal: Strength & Muscle Tone: within  normal limits Gait & Station: unsteady, shuffle Patient leans: Front  Treatment Plan Summary: Admit to inpatient psychiatry for stability of mood[facility]  Nanine MeansLORD, JAMISON, PMH-NP 09/14/2014 1:24 PM  I   Patient seen, evaluated and I agree with notes by Nurse Practitioner. Thedore MinsMojeed Yalda Herd, MD

## 2014-09-14 NOTE — ED Notes (Signed)
Patient's mother called to speak with him, but patient was sleeping.  Mother told we would have patient call her back.
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
## 2014-09-14 NOTE — ED Notes (Signed)
Patient's ears grossly dirty and cerumen noted bilaterally. Patient's ears irrigated with sterile water after colace applied bilat.

## 2014-09-14 NOTE — ED Notes (Signed)
Pts linen and clothing were changed

## 2014-09-14 NOTE — ED Notes (Signed)
RN flushed his ears out she did a great job. Im sure he can hear better from all the build up wax that came out in clots

## 2014-09-14 NOTE — ED Notes (Signed)
Pt ate all his food

## 2014-09-14 NOTE — ED Notes (Signed)
Pt giving ham sandwich and a sprite

## 2014-09-14 NOTE — ED Notes (Signed)
Assumed care of patient from Jeanella Antonrystal Mitchell, Charity fundraiserN.  Patient in bed eating a snack at shift change.  Upon trying to assess patient, patient replied, "Shut up," and "Fuck you."  Patient otherwise cooperative, but constantly telling sitter and other staff to "shut up."

## 2014-09-14 NOTE — ED Notes (Signed)
Pt woke up, pt called his mom and spoke with her for about 5 mins. Pt asking for food. Pt giving a sandwich and drink

## 2014-09-15 DIAGNOSIS — R4189 Other symptoms and signs involving cognitive functions and awareness: Secondary | ICD-10-CM

## 2014-09-15 DIAGNOSIS — Z87820 Personal history of traumatic brain injury: Secondary | ICD-10-CM

## 2014-09-15 NOTE — Consult Note (Signed)
[facility]   Reason for referral:  Agitation  Referring Physician:  EDP  Pablo Lawrenceykwan Tworek is an 33 y.o. male. Total Time spent with patient: 30 minutes  Assessment: AXIS I:  Cognitive Deficits, Traumatic Brain Injury AXIS II:  Deferred AXIS III:   Past Medical History  Diagnosis Date  . Depression     Pt was planning to start taking abilify 10/20  . Traumatic brain injury   . Seizures   . Bipolar 1 disorder    AXIS IV:  housing problems, other psychosocial or environmental problems, problems related to social environment and problems with primary support group AXIS V:  Serious symptoms  Plan:  Admit to inpatient psychiatry for stabilization.    Subjective:   HPI:  Patient remains volatile with hitting staff at times, unprovoked and unpredictable.  He frequently hits staff, curses, and is agitated.  Medications readjusted.  Awaiting [facility]. Review of Systems  HENT: Negative for congestion.   Respiratory: Negative for cough, shortness of breath and wheezing.   Gastrointestinal: Negative for nausea, vomiting, abdominal pain, diarrhea and constipation.       Incont.  bowel   Genitourinary:       Incont. bladder   Neurological: Positive for seizures. Negative for dizziness, tremors and headaches.  Psychiatric/Behavioral: Negative for depression, suicidal ideas, hallucinations and memory loss. The patient is not nervous/anxious and does not have insomnia.   All other systems reviewed and are negative.   Past Psychiatric History: Past Medical History  Diagnosis Date  . Depression     Pt was planning to start taking abilify 10/20  . Traumatic brain injury   . Seizures   . Bipolar 1 disorder     reports that he has quit smoking. He does not have any smokeless tobacco history on file. He reports that he does not drink alcohol or use illicit drugs. History reviewed. No pertinent family history.         Allergies:  No Known Allergies  ACT Assessment  Complete:  Yes:    Educational Status    Risk to Self: Risk to self with the past 6 months Is patient at risk for suicide?: No Substance abuse history and/or treatment for substance abuse?: No  Risk to Others:    Abuse:    Prior Inpatient Therapy:    Prior Outpatient Therapy:    Additional Information:          Objective: Blood pressure 102/59, pulse 91, temperature 98.4 F (36.9 C), temperature source Oral, resp. rate 18, weight 152 lb 4.8 oz (69.083 kg), SpO2 96 %.Body mass index is 26.99 kg/(m^2). No results found for this or any previous visit (from the past 72 hour(s)). Labs are reviewed see above values.  Medications reviewed and no changes made.  Current Facility-Administered Medications  Medication Dose Route Frequency Provider Last Rate Last Dose  . acetaminophen (TYLENOL) tablet 650 mg  650 mg Oral Q6H PRN Arman FilterGail K Schulz, NP      . alum & mag hydroxide-simeth (MAALOX/MYLANTA) 200-200-20 MG/5ML suspension 30 mL  30 mL Oral PRN [name], PA-C      . benztropine (COGENTIN) tablet 1 mg  1 mg Oral Daily Craige CottaFernando A Cobos, MD   1 mg at 09/14/14 1027  . divalproex (DEPAKOTE) DR tablet 500 mg  500 mg Oral Daily Nanine MeansJamison Kathlynn Swofford, NP   500 mg at 09/14/14 0815  . divalproex (DEPAKOTE) DR tablet 750 mg  750 mg Oral Daily Nanine MeansJamison Thang Flett, NP  750 mg at 09/14/14 1738  . haloperidol lactate (HALDOL) injection 2 mg  2 mg Intramuscular Q2H PRN Kerry HoughSpencer E Simon, PA-C   2 mg at 09/13/14 [id number]  . ibuprofen (ADVIL,MOTRIN) tablet 600 mg  600 mg Oral Q6H PRN Arman FilterGail K Schulz, NP      . nicotine (NICODERM CQ - dosed in mg/24 hours) patch 21 mg  21 mg Transdermal Daily [name], PA-C   21 mg at 09/14/14 1105  . nicotine polacrilex (NICORETTE) gum 2 mg  2 mg Oral PRN [name], PA-C   2 mg at 08/19/14 0535  . ondansetron (ZOFRAN) tablet 4 mg  4 mg Oral Q8H PRN [name], PA-C      . ziprasidone (GEODON) capsule 40 mg  40 mg Oral QPC supper Mojeed Akintayo   40 mg at 09/14/14 1738   Current  Outpatient Prescriptions  Medication Sig Dispense Refill  . acetaminophen (TYLENOL) 325 MG tablet Take 650 mg by mouth every 6 (six) hours as needed for moderate pain.    . divalproex (DEPAKOTE) 500 MG DR tablet Take 1 tablet (500 mg total) by mouth every 12 (twelve) hours. 60 tablet 0  . escitalopram (LEXAPRO) 10 MG tablet Take 1 tablet (10 mg total) by mouth daily. 30 tablet 3  . ibuprofen (ADVIL,MOTRIN) 600 MG tablet Take 600 mg by mouth every 6 (six) hours as needed for moderate pain.    Marland Kitchen. LORazepam (ATIVAN) 1 MG tablet Take 1 tablet (1 mg total) by mouth every 8 (eight) hours as needed for anxiety (agitation). 30 tablet 0  . multivitamin-iron-minerals-folic acid (CENTRUM) chewable tablet Chew 1 tablet by mouth daily.    . QUEtiapine (SEROQUEL) 50 MG tablet Take 1 tablet (50 mg total) by mouth 4 (four) times daily. 150 tablet 0  . traZODone (DESYREL) 100 MG tablet Take 1 tablet (100 mg total) by mouth at bedtime.      Psychiatric Specialty Exam:     Blood pressure 102/59, pulse 91, temperature 98.4 F (36.9 C), temperature source Oral, resp. rate 18, weight 152 lb 4.8 oz (69.083 kg), SpO2 96 %.Body mass index is 26.99 kg/(m^2).  General Appearance: Casual  Eye Contact::  Fair  Speech:  Slurred  Volume:  Normal  Mood:  Irritable  Affect:  Blunt  Thought Process:  Coherent  Orientation:  Other:  Person and place  Thought Content:  "I don't like it here" Shut up, F*&% you, go away"  Suicidal Thoughts:  No  Homicidal Thoughts:  No  Memory:  Immediate;   Poor Recent;   Poor Remote;   Poor  Judgement:  Impaired  Insight:  Lacking  Psychomotor Activity:  Normal  Concentration:  Poor  Recall:  Poor  Fund of Knowledge:Poor  Language: Poor  Akathisia:  No  Handed:  Right  AIMS (if indicated):     Assets:  Social Support  Sleep:      Musculoskeletal: Strength & Muscle Tone: within normal limits Gait & Station: unsteady, shuffle Patient leans: Front  Treatment Plan  Summary: Admit to inpatient psychiatry for stability of mood, [facility] wait list for violence, Zyprexa discontinued--due to this addition making 2 antipsychotics to his medication regiment.  Nanine MeansLORD, Shandrika Ambers, PMH-NP 09/15/2014 8:09 AM  I

## 2014-09-15 NOTE — ED Notes (Signed)
Checked Pt and he was dry.
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
## 2014-09-15 NOTE — ED Notes (Signed)
Pt clothing and linen changed. Pt cleaned.
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
## 2014-09-16 DIAGNOSIS — Z87820 Personal history of traumatic brain injury: Secondary | ICD-10-CM

## 2014-09-16 DIAGNOSIS — R4189 Other symptoms and signs involving cognitive functions and awareness: Secondary | ICD-10-CM
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
## 2014-09-16 NOTE — ED Notes (Signed)
Pt resting sitter in reach of the Pt will continue to monitor. Estill DoomsSimon, Gisel Vipond Mahario, RN 1:29 AM 09/16/2014
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
## 2014-09-16 NOTE — ED Notes (Signed)
Belongings placed in locker 32 with brown slippers placed in pt belongings bag. Estill DoomsSimon, Chanse Kagel Mahario, RN 7:23 AM 09/16/2014

## 2014-09-16 NOTE — ED Notes (Signed)
Pt combative unable to get his vital signs

## 2014-09-17 NOTE — Progress Notes (Signed)
Per Jun[facility] Dresseronnie, [facility] No [facility] expected today.   Byrd HesselbachKristen Gennifer Potenza, LCSW [phone]  ED CSW 09/17/2014 1054am

## 2014-09-17 NOTE — ED Notes (Signed)
Patient refusing vital signs. Spitting at staff. Redirection non affective at this time.
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
## 2014-09-17 NOTE — Consult Note (Signed)
[facility] Face-[facility]Face [facility]iat[facility] Consult   Reason for referral:  Agitation  Referring Physician:  EDP  Cory [name] is an 33 y.o. male. Total Time spent with patient: 30 minutes  Assessment: AXIS I:  Cognitive Deficits, Traumatic Brain Injury AXIS II:  Deferred AXIS III:   Past Medical History  Diagnosis Date  . Depression     Pt was planning to start taking abilify 10/20  . Traumatic brain injury   . Seizures   . Bipolar 1 disorder    AXIS IV:  housing problems, other psychosocial or environmental problems, problems related to social environment and problems with primary support group AXIS V:  Serious symptoms  Plan:  Admit to inpatient psychiatry for stabilization.    Subjective:   HPI:  Patient agitated and aggressive this am, requiring a PRN.  Cory [name] likes M&Ms but would not calm down even with these.  He was yelling, "Fuck you, leave me alone, go to hell, I hate you." Review of Systems  HENT: Negative for congestion.   Respiratory: Negative for cough, shortness of breath and wheezing.   Gastrointestinal: Negative for nausea, vomiting, abdominal pain, diarrhea and constipation.       Incont.  bowel   Genitourinary:       Incont. bladder   Neurological: Positive for seizures. Negative for dizziness, tremors and headaches.  Psychiatric/Behavioral: Negative for depression, suicidal ideas, hallucinations and memory loss. The patient is not nervous/anxious and does not have insomnia.   All other systems reviewed and are negative.   Past Psychiatric History: Past Medical History  Diagnosis Date  . Depression     Pt was planning to start taking abilify 10/20  . Traumatic brain injury   . Seizures   . Bipolar 1 disorder     reports that he has quit smoking. He does not have any smokeless tobacco history on file. He reports that he does not drink alcohol or use illicit drugs. History reviewed. No pertinent family history.         Allergies:  No Known Allergies  ACT  Assessment Complete:  Yes:    Educational Status    Risk to Self: Risk to self with the past 6 months Is patient at risk for suicide?: No Substance abuse history and/or treatment for substance abuse?: No  Risk to Others:    Abuse:    Prior Inpatient Therapy:    Prior Outpatient Therapy:    Additional Information:          Objective: Blood pressure 107/66, pulse 89, temperature 98.2 F (36.8 C), temperature source Oral, resp. rate 18, weight 152 lb 4.8 oz (69.083 kg), SpO2 94 %.Body mass index is 26.99 kg/(m^2). No results found for this or any previous visit (from the past 72 hour(s)). Labs are reviewed see above values.  Medications reviewed and no changes made.  Current Facility-Administered Medications  Medication Dose Route Frequency Provider Last Rate Last Dose  . acetaminophen (TYLENOL) tablet 650 mg  650 mg Oral Q6H PRN Cory FilterGail K Schulz, NP      . alum & mag hydroxide-simeth (MAALOX/MYLANTA) 200-200-20 MG/5ML suspension 30 mL  30 mL Oral PRN [name], PA-C      . benztropine (COGENTIN) tablet 1 mg  1 mg Oral Daily Craige CottaFernando A Cobos, MD   1 mg at 09/17/14 1037  . divalproex (DEPAKOTE) DR tablet 500 mg  500 mg Oral Daily [name], NP   500 mg at 09/17/14 0800  . divalproex (DEPAKOTE) DR tablet 750 mg  750 mg Oral Daily [name], NP   750 mg at 09/16/14 1753  . haloperidol lactate (HALDOL) injection 2 mg  2 mg Intramuscular Q2H PRN Kerry HoughSpencer E Simon, PA-C   2 mg at 09/17/14 1043  . ibuprofen (ADVIL,MOTRIN) tablet 600 mg  600 mg Oral Q6H PRN Cory FilterGail K Schulz, NP      . nicotine (NICODERM CQ - dosed in mg/24 hours) patch 21 mg  21 mg Transdermal Daily [name], PA-C   21 mg at 09/17/14 1042  . nicotine polacrilex (NICORETTE) gum 2 mg  2 mg Oral PRN [name], PA-C   2 mg at 08/19/14 0535  . ondansetron (ZOFRAN) tablet 4 mg  4 mg Oral Q8H PRN [name], PA-C      . ziprasidone (GEODON) capsule 40 mg  40 mg Oral QPC supper Landy Dunnavant   40 mg at 09/16/14 1752    Current Outpatient Prescriptions  Medication Sig Dispense Refill  . acetaminophen (TYLENOL) 325 MG tablet Take 650 mg by mouth every 6 (six) hours as needed for moderate pain.    . divalproex (DEPAKOTE) 500 MG DR tablet Take 1 tablet (500 mg total) by mouth every 12 (twelve) hours. 60 tablet 0  . escitalopram (LEXAPRO) 10 MG tablet Take 1 tablet (10 mg total) by mouth daily. 30 tablet 3  . ibuprofen (ADVIL,MOTRIN) 600 MG tablet Take 600 mg by mouth every 6 (six) hours as needed for moderate pain.    Marland Kitchen. LORazepam (ATIVAN) 1 MG tablet Take 1 tablet (1 mg total) by mouth every 8 (eight) hours as needed for anxiety (agitation). 30 tablet 0  . multivitamin-iron-minerals-folic acid (CENTRUM) chewable tablet Chew 1 tablet by mouth daily.    . QUEtiapine (SEROQUEL) 50 MG tablet Take 1 tablet (50 mg total) by mouth 4 (four) times daily. 150 tablet 0  . traZODone (DESYREL) 100 MG tablet Take 1 tablet (100 mg total) by mouth at bedtime.      Psychiatric Specialty Exam:     Blood pressure 107/66, pulse 89, temperature 98.2 F (36.8 C), temperature source Oral, resp. rate 18, weight 152 lb 4.8 oz (69.083 kg), SpO2 94 %.Body mass index is 26.99 kg/(m^2).  General Appearance: Casual  Eye Contact::  Fair  Speech:  Slurred  Volume:  Normal  Mood:  Irritable  Affect:  Blunt  Thought Process:  Coherent  Orientation:  Other:  Person and place  Thought Content:  "I don't like it here" Shut up, F*&% you, go away"  Suicidal Thoughts:  No  Homicidal Thoughts:  No  Memory:  Immediate;   Poor Recent;   Poor Remote;   Poor  Judgement:  Impaired  Insight:  Lacking  Psychomotor Activity:  Normal  Concentration:  Poor  Recall:  Poor  Fund of Knowledge:Poor  Language: Poor  Akathisia:  No  Handed:  Right  AIMS (if indicated):     Assets:  Social Support  Sleep:      Musculoskeletal: Strength & Muscle Tone: within normal limits Gait & Station: unsteady, shuffle Patient leans: Front  Treatment  Plan Summary: Admit to inpatient psychiatry for stability of mood, [facility] wait list for violence, Zyprexa discontinued--due to this addition making 2 antipsychotics to his medication regiment.  Nanine MeansLORD, JAMISON, PMH-NP 09/17/2014 1:26 PM  I   Patient seen, evaluated and I agree with notes by Nurse Practitioner. Thedore MinsMojeed Kharon Hixon, MD

## 2014-09-17 NOTE — ED Notes (Signed)
Pt woke up this morning cursing and repeating "shut up." Pt took his scheduled medications with encouragement. Pt attempted to spit at staff and required redirection. Offered support and observation. Safety maintained.

## 2014-09-17 NOTE — ED Notes (Signed)
Pt was given extra dinner tray that was ordered for him. Pt ate 100%

## 2014-09-17 NOTE — ED Notes (Signed)
Patient refusing vital signs at this time.

## 2014-09-17 NOTE — ED Notes (Signed)
[facility], pt still waiting for placement.

## 2014-09-18 NOTE — ED Notes (Signed)
Bed: [id number] Expected date:  Expected time:  Means of arrival:  Comments: Hold for TCU 30

## 2014-09-18 NOTE — ED Notes (Signed)
Pt. using profanity to staff

## 2014-09-18 NOTE — ED Notes (Signed)
Gave pt popcorn and a sprite.

## 2014-09-18 NOTE — Consult Note (Signed)
[facility]   Reason for referral:  Agitation  Referring Physician:  EDP  Cory [name] is an 33 y.o. male. Total Time spent with patient:  25 minutes  Assessment: AXIS I:  Cognitive Deficits, Traumatic Brain Injury AXIS II:  Deferred AXIS III:   Past Medical History  Diagnosis Date  . Depression     Pt was planning to start taking abilify 10/20  . Traumatic brain injury   . Seizures   . Bipolar 1 disorder    AXIS IV:  housing problems, other psychosocial or environmental problems, problems related to social environment and problems with primary support group AXIS V:  Serious symptoms  Plan:  Admit to inpatient psychiatry for stabilization.    Subjective: Pt seen by Assunta FoundShuvon Rankin, NP and chart reviewed. Pt reports that she is feeling the same today as yesterday. Pt continues to be aggressive and uncooperative and meets inpatient admission criteria per Dr. Jannifer FranklinAkintayo. [facility]   HPI:  Patient agitated and aggressive this am, requiring a PRN.  Cory [name] likes M&Ms but would not calm down even with these.  He was yelling, "Fuck you, leave me alone, go to hell, I hate you."   Review of Systems  Constitutional: Negative.   HENT: Negative for congestion.   Eyes: Negative.   Respiratory: Negative for cough, shortness of breath and wheezing.   Cardiovascular: Negative.   Gastrointestinal: Negative for nausea, vomiting, abdominal pain, diarrhea and constipation.       Incont.  bowel   Genitourinary: Negative.        Incont. bladder   Musculoskeletal: Negative.   Skin: Negative.   Neurological: Positive for seizures. Negative for dizziness, tremors and headaches.  Psychiatric/Behavioral: Negative for depression, suicidal ideas, hallucinations and memory loss. The patient is not nervous/anxious and does not have insomnia.   All other systems reviewed and are negative.   Past Psychiatric History: Past Medical History   Diagnosis Date  . Depression     Pt was planning to start taking abilify 10/20  . Traumatic brain injury   . Seizures   . Bipolar 1 disorder     reports that he has quit smoking. He does not have any smokeless tobacco history on file. He reports that he does not drink alcohol or use illicit drugs. History reviewed. No pertinent family history.         Allergies:  No Known Allergies  ACT Assessment Complete:  Yes:    Educational Status    Risk to Self: Risk to self with the past 6 months Is patient at risk for suicide?: No Substance abuse history and/or treatment for substance abuse?: No  Risk to Others:    Abuse:    Prior Inpatient Therapy:    Prior Outpatient Therapy:    Additional Information:          Objective: Blood pressure 117/63, pulse 90, temperature 98.4 F (36.9 C), temperature source Oral, resp. rate 18, weight 69.083 kg (152 lb 4.8 oz), SpO2 99 %.Body mass index is 26.99 kg/(m^2). No results found for this or any previous visit (from the past 72 hour(s)). Labs are reviewed see above values.  Medications reviewed and no changes made.  Current Facility-Administered Medications  Medication Dose Route Frequency Provider Last Rate Last Dose  . acetaminophen (TYLENOL) tablet 650 mg  650 mg Oral Q6H PRN Arman FilterGail K Schulz, NP      . alum & mag hydroxide-simeth (MAALOX/MYLANTA) 200-200-20 MG/5ML  suspension 30 mL  30 mL Oral PRN Fayrene Helper, PA-C      . benztropine (COGENTIN) tablet 1 mg  1 mg Oral Daily Craige Cotta, MD   1 mg at 09/18/14 1053  . divalproex (DEPAKOTE) DR tablet 500 mg  500 mg Oral Daily Nanine Means, NP   500 mg at 09/18/14 1052  . divalproex (DEPAKOTE) DR tablet 750 mg  750 mg Oral Daily Nanine Means, NP   750 mg at 09/17/14 1811  . haloperidol lactate (HALDOL) injection 2 mg  2 mg Intramuscular Q2H PRN Kerry Hough, PA-C   2 mg at 09/17/14 1043  . ibuprofen (ADVIL,MOTRIN) tablet 600 mg  600 mg Oral Q6H PRN Arman Filter, NP      . nicotine  (NICODERM CQ - dosed in mg/24 hours) patch 21 mg  21 mg Transdermal Daily Fayrene Helper, PA-C   21 mg at 09/18/14 1054  . nicotine polacrilex (NICORETTE) gum 2 mg  2 mg Oral PRN Court Joy, PA-C   2 mg at 08/19/14 0535  . ondansetron (ZOFRAN) tablet 4 mg  4 mg Oral Q8H PRN Fayrene Helper, PA-C      . ziprasidone (GEODON) capsule 40 mg  40 mg Oral QPC supper Rashaad Hallstrom   40 mg at 09/17/14 1812   Current Outpatient Prescriptions  Medication Sig Dispense Refill  . acetaminophen (TYLENOL) 325 MG tablet Take 650 mg by mouth every 6 (six) hours as needed for moderate pain.    . divalproex (DEPAKOTE) 500 MG DR tablet Take 1 tablet (500 mg total) by mouth every 12 (twelve) hours. 60 tablet 0  . escitalopram (LEXAPRO) 10 MG tablet Take 1 tablet (10 mg total) by mouth daily. 30 tablet 3  . ibuprofen (ADVIL,MOTRIN) 600 MG tablet Take 600 mg by mouth every 6 (six) hours as needed for moderate pain.    Marland Kitchen LORazepam (ATIVAN) 1 MG tablet Take 1 tablet (1 mg total) by mouth every 8 (eight) hours as needed for anxiety (agitation). 30 tablet 0  . multivitamin-iron-minerals-folic acid (CENTRUM) chewable tablet Chew 1 tablet by mouth daily.    . QUEtiapine (SEROQUEL) 50 MG tablet Take 1 tablet (50 mg total) by mouth 4 (four) times daily. 150 tablet 0  . traZODone (DESYREL) 100 MG tablet Take 1 tablet (100 mg total) by mouth at bedtime.      Psychiatric Specialty Exam:     Blood pressure 117/63, pulse 90, temperature 98.4 F (36.9 C), temperature source Oral, resp. rate 18, weight 69.083 kg (152 lb 4.8 oz), SpO2 99 %.Body mass index is 26.99 kg/(m^2).  General Appearance: Casual  Eye Contact::  Fair  Speech:  Slurred  Volume:  Normal  Mood:  Irritable  Affect:  Blunt  Thought Process:  Coherent  Orientation:  Other:  Person and place  Thought Content:  "I don't like it here" profanity and aggressive behaviors  Suicidal Thoughts:  No  Homicidal Thoughts:  No  Memory:  Immediate;   Poor Recent;    Poor Remote;   Poor  Judgement:  Impaired  Insight:  Lacking  Psychomotor Activity:  Normal  Concentration:  Poor  Recall:  Poor  Fund of Knowledge:Poor  Language: Poor  Akathisia:  No  Handed:  Right  AIMS (if indicated):     Assets:  Social Support  Sleep:      Musculoskeletal: Strength & Muscle Tone: within normal limits Gait & Station: unsteady, shuffle Patient leans: Front  Treatment Plan Summary: Admit  to inpatient psychiatry for stability of mood, [facility] wait list for violence.  Constance HawWithrow, John C,FNP-BC 09/18/2014 1:31 PM   Patient seen, evaluated and I agree with notes by Nurse Practitioner. Thedore MinsMojeed Drema Eddington, MD

## 2014-09-18 NOTE — ED Notes (Signed)
Pt asking for food. Pt giving ham sandwich and a sprite

## 2014-09-18 NOTE — BH Assessment (Signed)
Dr. Jannifer FranklinAkintayo and Assunta FoundShuvon Rankin, NP [facility]. CS[facility] Robin[facility], pt remains on wait[facility] and no movement today.

## 2014-09-19 MED ORDER — LORAZEPAM 2 MG/ML IJ SOLN
2.0000 mg | Freq: Once | INTRAMUSCULAR | Status: AC
  Administered 2014-09-19: 2 mg via INTRAMUSCULAR
  Filled 2014-09-19: qty 1

## 2014-09-19 NOTE — ED Notes (Signed)
PT walked outside by sitter to EMS bay. Refused the use of a walker. Once outside became very unsteady and needed assistance to re stabilize. Returned back to the room because of continued weakness.

## 2014-09-19 NOTE — ED Notes (Signed)
PT asked to use the bathroom. Walked with 1 staff assistance to bathroom. 

## 2014-09-19 NOTE — ED Notes (Signed)
Bed: [id number] Expected date:  Expected time:  Means of arrival:  Comments: For tcu

## 2014-09-19 NOTE — ED Notes (Signed)
PT asked to use the bathroom. Walked with 1 staff assistance to bathroom.
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
## 2014-09-19 NOTE — Progress Notes (Signed)
[facility]  Derrell Lollingoris Jozy Mcphearson, MSW Social Worker [phone]

## 2014-09-19 NOTE — ED Notes (Signed)
PT asked to use the bathroom. Walked with 1 staff assistance and walker to bathroom.

## 2014-09-19 NOTE — ED Notes (Signed)
Pt. Refuse 6 am vitals

## 2014-09-19 NOTE — ED Notes (Signed)
Pt restraints removed while pt asleep

## 2014-09-19 NOTE — ED Notes (Signed)
Pt wet himself in bed. Bed linens changed, pt cleaned and new maroon scrubs given.

## 2014-09-19 NOTE — ED Notes (Signed)
Sitter with pt. at bedside. Pt is well behaved, talking to sitter.

## 2014-09-19 NOTE — ED Notes (Signed)
Bed: WA26 Expected date:  Expected time:  Means of arrival:  Comments: 
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
## 2014-09-19 NOTE — ED Notes (Signed)
Pt ambulated with assist of walker and standby assist with sitter.

## 2014-09-19 NOTE — ED Notes (Signed)
PT asked sitter to walk him around the ED. Sitter agreed but said he must use his walker. PT walked the entire ED with walker without problem.

## 2014-09-19 NOTE — Progress Notes (Signed)
PT Note  Patient Details Name: Cory [name] MRN: [id number] DOB: [birth year]   Order received , and chart reviewed.  We are familar with this pt and after discussing with nurse and nurse at this time, pt is at his "normal" they report. Pt continues to walk (and has done so today) with RW and supervision on the unit when he feels like it. They also  Stated that at times he does walk a little without the RW. He does have an abnormal gait pattern due to his deficits, however he is resistant to structured PT and learning new movement patterns as typical with 6 month+ out brain injury.      At this time pt is at his baseline and we will not be picking him up on our services. thanks   Marella BileBRITT, Santrice Muzio 09/19/2014, 3:06 PM  Marella BileSharron Ryliee Figge, PT Pager: [phone] 09/19/2014

## 2014-09-19 NOTE — Consult Note (Signed)
[name] Face-to-Face Psychiatry Consult   Reason for referral:  Agitation  Referring Physician:  EDP  Cory [name] is an 33 y.o. male. Total Time spent with patient:  25 minutes  Assessment: AXIS I:  Cognitive Deficits, Traumatic Brain Injury AXIS II:  Deferred AXIS III:   Past Medical History  Diagnosis Date  . Depression     Pt was planning to start taking abilify 10/20  . Traumatic brain injury   . Seizures   . Bipolar 1 disorder    AXIS IV:  housing problems, other psychosocial or environmental problems, problems related to social environment and problems with primary support group AXIS V:  Serious symptoms  Plan:  Admit to inpatient psychiatry for stabilization.    Subjective: Pt seen by Cory Found, NP and chart reviewed. Pt reports that she is feeling the same today as yesterday. Pt continues to be aggressive and uncooperative and meets inpatient admission criteria per Dr. Jannifer [name]. Pt to be admitted to [name] pending bed availability.   HPI:  Patient has been calm most of the day.  Patient wanted phone to call his mother.  Patient asking for M&M's.  Review of Systems  Constitutional: Negative.   HENT: Negative for congestion.   Eyes: Negative.   Respiratory: Negative for cough, shortness of breath and wheezing.   Cardiovascular: Negative.   Gastrointestinal: Negative for nausea, vomiting, abdominal pain, diarrhea and constipation.       Incont.  bowel   Genitourinary: Negative.        Incont. bladder   Musculoskeletal: Negative.   Skin: Negative.   Neurological: Positive for seizures. Negative for dizziness, tremors and headaches.  Psychiatric/Behavioral: Negative for depression, suicidal ideas, hallucinations and memory loss. The patient is not nervous/anxious and does not have insomnia.   All other systems reviewed and are negative.   Past Psychiatric History: Past Medical History  Diagnosis Date  . Depression     Pt was planning to start taking abilify 10/20   . Traumatic brain injury   . Seizures   . Bipolar 1 disorder     reports that he has quit smoking. He does not have any smokeless tobacco history on file. He reports that he does not drink alcohol or use illicit drugs. History reviewed. No pertinent family history.         Allergies:  No Known Allergies  ACT Assessment Complete:  Yes:    Educational Status    Risk to Self: Risk to self with the past 6 months Is patient at risk for suicide?: No Substance abuse history and/or treatment for substance abuse?: No  Risk to Others:    Abuse:    Prior Inpatient Therapy:    Prior Outpatient Therapy:    Additional Information:          Objective: Blood pressure 125/79, pulse 114, temperature 98 F (36.7 C), temperature source Oral, resp. rate 16, weight 69.083 kg (152 lb 4.8 oz), SpO2 99 %.Body mass index is 26.99 kg/(m^2). No results [name] for this or any previous visit (from the past 72 hour(s)). Labs are reviewed see above values.  Medications reviewed and no changes made.  Current Facility-Administered Medications  Medication Dose Route Frequency Provider Last Rate Last Dose  . acetaminophen (TYLENOL) tablet 650 mg  650 mg Oral Q6H PRN Arman Filter, NP      . alum & mag hydroxide-simeth (MAALOX/MYLANTA) 200-200-20 MG/5ML suspension 30 mL  30 mL Oral PRN Fayrene Helper, PA-C      .  benztropine (COGENTIN) tablet 1 mg  1 mg Oral Daily Rockey SituFernando A Cobos, MD   1 mg at 09/19/14 1011  . divalproex (DEPAKOTE) DR tablet 500 mg  500 mg Oral Daily [name], NP   500 mg at 09/19/14 [id number]  . divalproex (DEPAKOTE) DR tablet 750 mg  750 mg Oral Daily [name], NP   750 mg at 09/18/14 1813  . haloperidol lactate (HALDOL) injection 2 mg  2 mg Intramuscular Q2H PRN Kerry HoughSpencer E Simon, PA-C   2 mg at 09/17/14 1043  . ibuprofen (ADVIL,MOTRIN) tablet 600 mg  600 mg Oral Q6H PRN Arman FilterGail K Schulz, NP      . nicotine (NICODERM CQ - dosed in mg/24 hours) patch 21 mg  21 mg Transdermal Daily [name],  PA-C   21 mg at 09/19/14 1012  . nicotine polacrilex (NICORETTE) gum 2 mg  2 mg Oral PRN [name], PA-C   2 mg at 08/19/14 0535  . ondansetron (ZOFRAN) tablet 4 mg  4 mg Oral Q8H PRN [name], PA-C      . ziprasidone (GEODON) capsule 40 mg  40 mg Oral QPC supper Alexxa Sabet   40 mg at 09/18/14 1814   Current Outpatient Prescriptions  Medication Sig Dispense Refill  . acetaminophen (TYLENOL) 325 MG tablet Take 650 mg by mouth every 6 (six) hours as needed for moderate pain.    . divalproex (DEPAKOTE) 500 MG DR tablet Take 1 tablet (500 mg total) by mouth every 12 (twelve) hours. 60 tablet 0  . escitalopram (LEXAPRO) 10 MG tablet Take 1 tablet (10 mg total) by mouth daily. 30 tablet 3  . ibuprofen (ADVIL,MOTRIN) 600 MG tablet Take 600 mg by mouth every 6 (six) hours as needed for moderate pain.    Marland Kitchen. LORazepam (ATIVAN) 1 MG tablet Take 1 tablet (1 mg total) by mouth every 8 (eight) hours as needed for anxiety (agitation). 30 tablet 0  . multivitamin-iron-minerals-folic acid (CENTRUM) chewable tablet Chew 1 tablet by mouth daily.    . QUEtiapine (SEROQUEL) 50 MG tablet Take 1 tablet (50 mg total) by mouth 4 (four) times daily. 150 tablet 0  . traZODone (DESYREL) 100 MG tablet Take 1 tablet (100 mg total) by mouth at bedtime.      Psychiatric Specialty Exam:     Blood pressure 125/79, pulse 114, temperature 98 F (36.7 C), temperature source Oral, resp. rate 16, weight 69.083 kg (152 lb 4.8 oz), SpO2 99 %.Body mass index is 26.99 kg/(m^2).  General Appearance: Casual  Eye Contact::  Fair  Speech:  Slurred  Volume:  Normal  Mood:  Irritable  Affect:  Blunt  Thought Process:  Coherent  Orientation:  Other:  Person and place  Thought Content:  "I don't like it here" profanity and aggressive behaviors  Suicidal Thoughts:  No  Homicidal Thoughts:  No  Memory:  Immediate;   Poor Recent;   Poor Remote;   Poor  Judgement:  Impaired  Insight:  Lacking  Psychomotor Activity:   Normal  Concentration:  Poor  Recall:  Poor  Fund of Knowledge:Poor  Language: Poor  Akathisia:  No  Handed:  Right  AIMS (if indicated):     Assets:  Social Support  Sleep:      Musculoskeletal: Strength & Muscle Tone: within normal limits Gait & Station: unsteady, shuffle Patient leans: Front  Treatment Plan Summary: Admit to inpatient psychiatry for stability of mood, [facility] wait list for violence.  Continue to seek inpatient  treatment for stabilization  Rankin, [name],FNP-BC 09/19/2014 3:52 PM   Patient seen, evaluated and I agree with notes by Nurse Practitioner. Thedore MinsMojeed Terrell Ostrand, MD
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
## 2014-09-20 DIAGNOSIS — F259 Schizoaffective disorder, unspecified: Secondary | ICD-10-CM

## 2014-09-20 MED ORDER — BENZTROPINE MESYLATE 1 MG PO TABS
1.0000 mg | ORAL_TABLET | Freq: Two times a day (BID) | ORAL | Status: DC
  Administered 2014-09-20 – 2014-10-05 (×30): 1 mg via ORAL
  Filled 2014-09-20 (×32): qty 1

## 2014-09-20 MED ORDER — CHLORPROMAZINE HCL 25 MG/ML IJ SOLN
50.0000 mg | Freq: Three times a day (TID) | INTRAMUSCULAR | Status: DC | PRN
  Administered 2014-09-20 – 2014-10-05 (×19): 50 mg via INTRAMUSCULAR
  Filled 2014-09-20 (×21): qty 2

## 2014-09-20 NOTE — BH Assessment (Signed)
[facility] Assessment Progress Note  [facility], [facility] [phone], who stated pt was still on the wait list, but that there would not be available beds there until at least Monday, due to pt needing a medical bed on their unit and there are no attending doctors there due to the holiday.  Informed Jun[facility] DresserConnie of pt's aggressive behavior in ED with staff and faxed ED notes over reflecting this in order to attempt to prioritize the pt on their waiting list.  Casimer LaniusKristen Buelah Rennie, MS, [facility] Licensed Professional Counselor Therapeutic Triage Specialist [facility] Phone: [phone] Fax: [phone]

## 2014-09-20 NOTE — ED Notes (Signed)
Patient cursing at individuals walking past his room, patient spitting towards staff, and attempting to get up without assistance. Sitter at bedside.
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
## 2014-09-20 NOTE — ED Notes (Signed)
Pt asleep, will get EKG when pt awake

## 2014-09-20 NOTE — ED Notes (Signed)
Patient calm and cooperative. Patient restraints removed at this time.

## 2014-09-20 NOTE — ED Notes (Signed)
Charge made rule that for today no one is to enter pts room without security present. James in security made aware of request. Pt provided with meal tray and released from all restraints. Pt told he must behave if he is to stay out of restraints. Pt sitting on side of bed eating meal. Pt still cursing at staff at times.

## 2014-09-20 NOTE — ED Notes (Signed)
Pt out of restraints at present to eat breakfast, security at bedside
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
## 2014-09-20 NOTE — ED Notes (Signed)
Patient continues to curse at individuals walking past his room. Patient overall calmer at this time. Patient ankle restraints removed at this time. Patient remains in bilateral wrist restraints at this time.
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
## 2014-09-20 NOTE — ED Notes (Signed)
Pt becoming increasingly agitated, cursing at all staff while staff changing pt. Pt saying racial slurs. attempted to spit on staff again, mask placed on pt.
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
## 2014-09-21 NOTE — ED Notes (Signed)
Patient ate 50% of his breakfast
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
## 2014-09-21 NOTE — ED Notes (Signed)
Patient's mother at the bedside. Patient frequently asking if he can go home with her.

## 2014-09-21 NOTE — ED Notes (Signed)
Patient ate 50% of his lunch

## 2014-09-21 NOTE — ED Notes (Signed)
Dewon was given M&Ms and sprite for good behavior... He walked to the bathroom and back.

## 2014-09-21 NOTE — Progress Notes (Signed)
[facility]. Per Nurse tech, the patient is on the waiting list and should hear a date regarding admission "soon".  Trish MageBrittney Valori Hollenkamp, LCSWA [phone] ED CSW 09/21/2014 9:23 PM

## 2014-09-21 NOTE — [facility] Counselor (Signed)
Writer called [facility] and Vonna KotykJay confirms pt still on wait list.  Evette Cristalaroline Paige Jenetta Wease, LCSWA Assessment Counselor
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
## 2014-09-21 NOTE — ED Notes (Signed)
A copy of the care plan given to the sitter.

## 2014-09-22 NOTE — ED Notes (Signed)
Patient remains asleep.  Did not awaken because patient did not sleep well last night.  Sitter to notify when patient wakes up.

## 2014-09-22 NOTE — Consult Note (Signed)
[facility] Face-to-Face Psychiatry Consult   Reason for referral:  Agitation  Referring Physician:  EDP  Ether Wolters is an 33 y.o. male. Total Time spent with patient: 30 minutes  Assessment: AXIS I:  Cognitive Deficits, Traumatic Brain Injury AXIS II:  Deferred AXIS III:   Past Medical History  Diagnosis Date  . Depression     Pt was planning to start taking abilify 10/20  . Traumatic brain injury   . Seizures   . Bipolar 1 disorder    AXIS IV:  housing problems, other psychosocial or environmental problems, problems related to social environment and problems with primary support group AXIS V:  Serious symptoms  Plan:  Awaiting [facility] placement.  Subjective:   HPI:  Patient continues to curse at staff with lability of mood and aggression.  Thorazine IM PRN does seem to be working better for him with no PRN medications needed since yesterday. Review of Systems  HENT: Negative for congestion.   Respiratory: Negative for cough, shortness of breath and wheezing.   Gastrointestinal: Negative for nausea, vomiting, abdominal pain, diarrhea and constipation.       Incont.  bowel   Genitourinary:       Incont. bladder   Neurological: Positive for seizures. Negative for dizziness, tremors and headaches.  Psychiatric/Behavioral: Negative for depression, suicidal ideas, hallucinations and memory loss. The patient is not nervous/anxious and does not have insomnia.   All other systems reviewed and are negative.   Past Psychiatric History: Past Medical History  Diagnosis Date  . Depression     Pt was planning to start taking abilify 10/20  . Traumatic brain injury   . Seizures   . Bipolar 1 disorder     reports that he has quit smoking. He does not have any smokeless tobacco history on file. He reports that he does not drink alcohol or use illicit drugs. History reviewed. No pertinent family history.         Allergies:  No Known Allergies  ACT Assessment Complete:  Yes:     Educational Status    Risk to Self: Risk to self with the past 6 months Is patient at risk for suicide?: No Substance abuse history and/or treatment for substance abuse?: No  Risk to Others:    Abuse:    Prior Inpatient Therapy:    Prior Outpatient Therapy:    Additional Information:          Objective: Blood pressure 104/71, pulse 107, temperature 98.7 F (37.1 C), temperature source Oral, resp. rate 18, weight 152 lb 4.8 oz (69.083 kg), SpO2 94 %.Body mass index is 26.99 kg/(m^2). Results for orders placed or performed during the hospital encounter of 07/28/14 (from the past 72 hour(s))  Wound culture     Status: None (Preliminary result)   Collection Time: 09/20/14  7:28 PM  Result Value Ref Range   Specimen Description KNEE LEFT    Special Requests Normal    Gram Stain      RARE WBC PRESENT,BOTH PMN AND MONONUCLEAR NO SQUAMOUS EPITHELIAL CELLS SEEN RARE GRAM POSITIVE COCCI IN PAIRS IN CLUSTERS Performed at [facility]    Culture      MODERATE STAPHYLOCOCCUS AUREUS Note: RIFAMPIN AND GENTAMICIN SHOULD NOT BE USED AS SINGLE DRUGS FOR TREATMENT OF STAPH INFECTIONS. Performed at [facility]    Report Status PENDING    Labs are reviewed see above values.  Medications reviewed and no changes made.  Current Facility-Administered Medications  Medication Dose  Route Frequency Provider Last Rate Last Dose  . acetaminophen (TYLENOL) tablet 650 mg  650 mg Oral Q6H PRN Arman FilterGail K Schulz, NP      . alum & mag hydroxide-simeth (MAALOX/MYLANTA) 200-200-20 MG/5ML suspension 30 mL  30 mL Oral PRN [name], PA-C      . benztropine (COGENTIN) tablet 1 mg  1 mg Oral BID [name], NP   1 mg at 09/22/14 1336  . chlorproMAZINE (THORAZINE) injection 50 mg  50 mg Intramuscular TID PRN [name], NP   50 mg at 09/21/14 1021  . divalproex (DEPAKOTE) DR tablet 500 mg  500 mg Oral Daily [name], NP   500 mg at 09/22/14 0759  . divalproex (DEPAKOTE) DR tablet 750 mg   750 mg Oral Daily [name], NP   750 mg at 09/21/14 1748  . ibuprofen (ADVIL,MOTRIN) tablet 600 mg  600 mg Oral Q6H PRN Arman FilterGail K Schulz, NP      . nicotine (NICODERM CQ - dosed in mg/24 hours) patch 21 mg  21 mg Transdermal Daily [name], PA-C   21 mg at 09/22/14 1337  . nicotine polacrilex (NICORETTE) gum 2 mg  2 mg Oral PRN [name], PA-C   2 mg at 08/19/14 0535  . ondansetron (ZOFRAN) tablet 4 mg  4 mg Oral Q8H PRN [name], PA-C      . ziprasidone (GEODON) capsule 40 mg  40 mg Oral QPC supper Leen Tworek   40 mg at 09/21/14 1748   Current Outpatient Prescriptions  Medication Sig Dispense Refill  . acetaminophen (TYLENOL) 325 MG tablet Take 650 mg by mouth every 6 (six) hours as needed for moderate pain.    . divalproex (DEPAKOTE) 500 MG DR tablet Take 1 tablet (500 mg total) by mouth every 12 (twelve) hours. 60 tablet 0  . escitalopram (LEXAPRO) 10 MG tablet Take 1 tablet (10 mg total) by mouth daily. 30 tablet 3  . ibuprofen (ADVIL,MOTRIN) 600 MG tablet Take 600 mg by mouth every 6 (six) hours as needed for moderate pain.    Marland Kitchen. LORazepam (ATIVAN) 1 MG tablet Take 1 tablet (1 mg total) by mouth every 8 (eight) hours as needed for anxiety (agitation). 30 tablet 0  . multivitamin-iron-minerals-folic acid (CENTRUM) chewable tablet Chew 1 tablet by mouth daily.    . QUEtiapine (SEROQUEL) 50 MG tablet Take 1 tablet (50 mg total) by mouth 4 (four) times daily. 150 tablet 0  . traZODone (DESYREL) 100 MG tablet Take 1 tablet (100 mg total) by mouth at bedtime.      Psychiatric Specialty Exam:     Blood pressure 104/71, pulse 107, temperature 98.7 F (37.1 C), temperature source Oral, resp. rate 18, weight 152 lb 4.8 oz (69.083 kg), SpO2 94 %.Body mass index is 26.99 kg/(m^2).  General Appearance: Casual  Eye Contact::  Fair  Speech:  Slurred  Volume:  Normal  Mood:  Irritable  Affect:  Blunt  Thought Process:  Coherent  Orientation:  Other:  Person and place  Thought  Content:  "I don't like it here" Shut up, F*&% you, go away"  Suicidal Thoughts:  No  Homicidal Thoughts:  No  Memory:  Immediate;   Poor Recent;   Poor Remote;   Poor  Judgement:  Impaired  Insight:  Lacking  Psychomotor Activity:  Normal  Concentration:  Poor  Recall:  Poor  Fund of Knowledge:Poor  Language: Poor  Akathisia:  No  Handed:  Right  AIMS (if indicated):  Assets:  Social Support  Sleep:      Musculoskeletal: Strength & Muscle Tone: within normal limits Gait & Station: unsteady, shuffle Patient leans: Front  Treatment Plan Summary: Admit to inpatient psychiatry for stability of mood, [facility] wait list for violence  Nanine MeansLORD, JAMISON, PMH-NP 09/22/2014 4:05 PM Patient seen, evaluated and I agree with notes by Nurse Practitioner. Thedore MinsMojeed Jovani Flury, MD

## 2014-09-22 NOTE — ED Notes (Signed)
Patient moved from bed 11 to TCU bed 30.  Alert, sitter at bedside.  Patient saying ___ you to nurse immediately, but seems bonded to his sitter.  Patient asking for breakfast, sitter assuring him it is on the way.

## 2014-09-22 NOTE — ED Notes (Signed)
Sitter reports that the pt has eaten a [place] sandwich, spaghetti, and pizza.

## 2014-09-22 NOTE — ED Notes (Signed)
Patient awakened, cussing, eating his lunch.  Incontinent of urine.  Sitter at bedside.

## 2014-09-23 ENCOUNTER — Encounter (HOSPITAL_COMMUNITY): Payer: Self-pay | Admitting: Registered Nurse

## 2014-09-23 LAB — WOUND CULTURE: SPECIAL REQUESTS: NORMAL

## 2014-09-23 MED ORDER — MUPIROCIN CALCIUM 2 % EX CREA
TOPICAL_CREAM | Freq: Two times a day (BID) | CUTANEOUS | Status: DC
  Administered 2014-09-23 – 2014-09-24 (×4): via TOPICAL
  Administered 2014-09-25: 1 via TOPICAL
  Administered 2014-09-25 – 2014-10-05 (×18): via TOPICAL
  Filled 2014-09-23 (×3): qty 15

## 2014-09-23 MED ORDER — DOXYCYCLINE HYCLATE 100 MG PO TABS
100.0000 mg | ORAL_TABLET | Freq: Two times a day (BID) | ORAL | Status: AC
  Administered 2014-09-23 – 2014-09-29 (×14): 100 mg via ORAL
  Filled 2014-09-23 (×15): qty 1

## 2014-09-23 NOTE — ED Provider Notes (Addendum)
Pt has slightly enlarging wound to left proximal lower leg.  Is 2cm abrasion, small amount of drainage.  No cellulitis.  Wound culture postive for MRSA. Will start on bactroban ointment and doxycylcline.  Contact precautions intitiated.  Rolan BuccoMelanie Alaiza Yau, MD 09/23/14 [phone]  Rolan BuccoMelanie Hermenia Fritcher, MD 09/23/14 [phone]

## 2014-09-23 NOTE — Consult Note (Signed)
[facility] Face-to-Face Psychiatry Consult   Reason for referral:  Agitation  Referring Physician:  EDP  Cory [name] is an 33 y.o. male. Total Time spent with patient: 30 minutes  Assessment: AXIS I:  Cognitive Deficits, Traumatic Brain Injury AXIS II:  Deferred AXIS III:   Past Medical History  Diagnosis Date  . Depression     Pt was planning to start taking abilify 10/20  . Traumatic brain injury   . Seizures   . Bipolar 1 disorder    AXIS IV:  housing problems, other psychosocial or environmental problems, problems related to social environment and problems with primary support group AXIS V:  Serious symptoms  Plan:  Awaiting [facility] placement.  Subjective:   HPI:  Patient continues to curse at staff with lability of mood and aggression.  Thorazine IM PRN does seem to be working better for him with no PRN medications needed since yesterday. Review of Systems  HENT: Negative for congestion.   Respiratory: Negative for cough, shortness of breath and wheezing.   Gastrointestinal: Negative for nausea, vomiting, abdominal pain, diarrhea and constipation.       Incont.  bowel   Genitourinary:       Incont. bladder   Skin:       Pt has slightly enlarging wound to left proximal lower leg. Is 2cm abrasion, small amount of drainage. No cellulitis. Wound culture postive for MRSA  Neurological: Positive for seizures. Negative for dizziness, tremors and headaches.  Psychiatric/Behavioral: Negative for depression, suicidal ideas, hallucinations and memory loss. The patient is not nervous/anxious and does not have insomnia.   All other systems reviewed and are negative.   Past Psychiatric History: Past Medical History  Diagnosis Date  . Depression     Pt was planning to start taking abilify 10/20  . Traumatic brain injury   . Seizures   . Bipolar 1 disorder     reports that he has quit smoking. He does not have any smokeless tobacco history on file. He reports that he does not  drink alcohol or use illicit drugs. History reviewed. No pertinent family history.         Allergies:  No Known Allergies  ACT Assessment Complete:  Yes:    Educational Status    Risk to Self: Risk to self with the past 6 months Is patient at risk for suicide?: No Substance abuse history and/or treatment for substance abuse?: No  Risk to Others:    Abuse:    Prior Inpatient Therapy:    Prior Outpatient Therapy:    Additional Information:          Objective: Blood pressure 105/52, pulse 96, temperature 98.1 F (36.7 C), temperature source Oral, resp. rate 18, weight 69.083 kg (152 lb 4.8 oz), SpO2 98 %.Body mass index is 26.99 kg/(m^2). Results for orders placed or performed during the hospital encounter of 07/28/14 (from the past 72 hour(s))  Wound culture     Status: None   Collection Time: 09/20/14  7:28 PM  Result Value Ref Range   Specimen Description KNEE LEFT    Special Requests Normal    Gram Stain      RARE WBC PRESENT,BOTH PMN AND MONONUCLEAR NO SQUAMOUS EPITHELIAL CELLS SEEN RARE GRAM POSITIVE COCCI IN PAIRS IN CLUSTERS Performed at [facility]    Culture      MODERATE METHICILLIN RESISTANT STAPHYLOCOCCUS AUREUS Note: RIFAMPIN AND GENTAMICIN SHOULD NOT BE USED AS SINGLE DRUGS FOR TREATMENT OF STAPH INFECTIONS. This  organism DOES NOT demonstrate inducible Clindamycin resistance in vitro. CRITICAL RESULT CALLED TO, READ BACK BY AND VERIFIED WITH: PATTY DOWD @  8:17AM 09/23/14 BY DWEEKS [facility]    Report Status 09/23/2014 FINAL    Organism ID, Bacteria METHICILLIN RESISTANT STAPHYLOCOCCUS AUREUS       Susceptibility   Methicillin resistant staphylococcus aureus - MIC*    CLINDAMYCIN <=0.25 SENSITIVE Sensitive     ERYTHROMYCIN >=8 RESISTANT Resistant     GENTAMICIN <=0.5 SENSITIVE Sensitive     LEVOFLOXACIN 4 INTERMEDIATE Intermediate     OXACILLIN >=4 RESISTANT Resistant     PENICILLIN >=0.5 RESISTANT Resistant      RIFAMPIN <=0.5 SENSITIVE Sensitive     TRIMETH/SULFA <=10 SENSITIVE Sensitive     VANCOMYCIN 1 SENSITIVE Sensitive     TETRACYCLINE <=1 SENSITIVE Sensitive     * MODERATE METHICILLIN RESISTANT STAPHYLOCOCCUS AUREUS   Labs are reviewed see above values.  Medications reviewed and no changes made.  Current Facility-Administered Medications  Medication Dose Route Frequency Provider Last Rate Last Dose  . acetaminophen (TYLENOL) tablet 650 mg  650 mg Oral Q6H PRN Arman FilterGail K Schulz, NP      . alum & mag hydroxide-simeth (MAALOX/MYLANTA) 200-200-20 MG/5ML suspension 30 mL  30 mL Oral PRN [name], PA-C      . benztropine (COGENTIN) tablet 1 mg  1 mg Oral BID [name], NP   1 mg at 09/23/14 0926  . chlorproMAZINE (THORAZINE) injection 50 mg  50 mg Intramuscular TID PRN [name], NP   50 mg at 09/23/14 1225  . divalproex (DEPAKOTE) DR tablet 500 mg  500 mg Oral Daily [name], NP   500 mg at 09/23/14 0925  . divalproex (DEPAKOTE) DR tablet 750 mg  750 mg Oral Daily [name], NP   750 mg at 09/22/14 1733  . doxycycline (VIBRA-TABS) tablet 100 mg  100 mg Oral Q12H Rolan BuccoMelanie Belfi, MD   100 mg at 09/23/14 0926  . ibuprofen (ADVIL,MOTRIN) tablet 600 mg  600 mg Oral Q6H PRN Arman FilterGail K Schulz, NP      . mupirocin cream (BACTROBAN) 2 %   Topical BID Rolan BuccoMelanie Belfi, MD      . nicotine (NICODERM CQ - dosed in mg/24 hours) patch 21 mg  21 mg Transdermal Daily [name], PA-C   21 mg at 09/23/14 [id number]  . nicotine polacrilex (NICORETTE) gum 2 mg  2 mg Oral PRN [name], PA-C   2 mg at 08/19/14 0535  . ondansetron (ZOFRAN) tablet 4 mg  4 mg Oral Q8H PRN [name], PA-C      . ziprasidone (GEODON) capsule 40 mg  40 mg Oral QPC supper Norvil Martensen   40 mg at 09/22/14 1735   Current Outpatient Prescriptions  Medication Sig Dispense Refill  . acetaminophen (TYLENOL) 325 MG tablet Take 650 mg by mouth every 6 (six) hours as needed for moderate pain.    . divalproex (DEPAKOTE) 500 MG DR tablet Take  1 tablet (500 mg total) by mouth every 12 (twelve) hours. 60 tablet 0  . escitalopram (LEXAPRO) 10 MG tablet Take 1 tablet (10 mg total) by mouth daily. 30 tablet 3  . ibuprofen (ADVIL,MOTRIN) 600 MG tablet Take 600 mg by mouth every 6 (six) hours as needed for moderate pain.    Marland Kitchen. LORazepam (ATIVAN) 1 MG tablet Take 1 tablet (1 mg total) by mouth every 8 (eight) hours as needed for anxiety (agitation). 30 tablet 0  .  multivitamin-iron-minerals-folic acid (CENTRUM) chewable tablet Chew 1 tablet by mouth daily.    . QUEtiapine (SEROQUEL) 50 MG tablet Take 1 tablet (50 mg total) by mouth 4 (four) times daily. 150 tablet 0  . traZODone (DESYREL) 100 MG tablet Take 1 tablet (100 mg total) by mouth at bedtime.      Psychiatric Specialty Exam:     Blood pressure 105/52, pulse 96, temperature 98.1 F (36.7 C), temperature source Oral, resp. rate 18, weight 69.083 kg (152 lb 4.8 oz), SpO2 98 %.Body mass index is 26.99 kg/(m^2).  General Appearance: Casual  Eye Contact::  Fair  Speech:  Slurred  Volume:  Normal  Mood:  Irritable  Affect:  Blunt  Thought Process:  Coherent  Orientation:  Other:  Person and place  Thought Content:  "I don't like it here" Shut up, F*&% you, go away"  Suicidal Thoughts:  No  Homicidal Thoughts:  No  Memory:  Immediate;   Poor Recent;   Poor Remote;   Poor  Judgement:  Impaired  Insight:  Lacking  Psychomotor Activity:  Normal  Concentration:  Poor  Recall:  Poor  Fund of Knowledge:Poor  Language: Poor  Akathisia:  No  Handed:  Right  AIMS (if indicated):     Assets:  Social Support  Sleep:      Musculoskeletal: Strength & Muscle Tone: within normal limits Gait & Station: unsteady, shuffle Patient leans: Front  Treatment Plan Summary: Admit to inpatient psychiatry for stability of mood, [facility] wait list for violence  [name], [name], FNP-BC 09/23/2014 3:33 PM  Patient seen, evaluated and I agree with notes by Nurse Practitioner. Thedore MinsMojeed Paytin Ramakrishnan,  MD

## 2014-09-23 NOTE — ED Notes (Addendum)
Lab called with wound culture results. Wound culture + for MRSA. Dr Fredderick PhenixBelfi aware, wound on lateral lower left knee. Pt placed on Contact precautions.

## 2014-09-23 NOTE — [facility] Counselor (Signed)
Triage Specialist called [facility] to inquire about pt's pending placement and to get update on pt's status on wait list.   Pt is still on wait list for [facility] as of 0015 on 09/23/14. However, staff could not provide any idea of when a bed would become available.   Cyndie MullAnna Ari Engelbrecht, MS, [facility] Triage Specialist

## 2014-09-23 NOTE — ED Notes (Signed)
Pt. REFUSED for V/S taking.  

## 2014-09-23 NOTE — ED Notes (Signed)
Patient is resting comfortably. Breathing WNL.  

## 2014-09-24 LAB — COMPREHENSIVE METABOLIC PANEL
ALBUMIN: 3.2 g/dL — AB (ref 3.5–5.2)
ALT: 13 U/L (ref 0–53)
ANION GAP: 14 (ref 5–15)
AST: 25 U/L (ref 0–37)
Alkaline Phosphatase: 67 U/L (ref 39–117)
BUN: 13 mg/dL (ref 6–23)
CO2: 25 mEq/L (ref 19–32)
CREATININE: 1.04 mg/dL (ref 0.50–1.35)
Calcium: 8.4 mg/dL (ref 8.4–10.5)
Chloride: 99 mEq/L (ref 96–112)
GFR calc non Af Amer: >90 mL/min (ref >90)
GLUCOSE: 84 mg/dL (ref 70–99)
Potassium: 4.4 mEq/L (ref 3.7–5.3)
Sodium: 138 mEq/L (ref 137–147)
TOTAL PROTEIN: 8.3 g/dL (ref 6.0–8.3)
Total Bilirubin: 0.4 mg/dL (ref 0.3–1.2)

## 2014-09-24 LAB — VALPROIC ACID LEVEL: Valproic Acid Lvl: 58 ug/mL (ref 50.0–100.0)

## 2014-09-24 NOTE — Progress Notes (Signed)
Per [facility] Junious DresserConnie states that Dr. Adriana Simasook believes there will be bed available sometime this week.   Byrd HesselbachKristen Idelle Reimann, LCSW [phone]  ED CSW [id number] 833am

## 2014-09-24 NOTE — ED Notes (Addendum)
Dressing changed to left knee. Scant amount of bloody drainage noted on gauze. Site cleaned with normal saline, bactroban applied and covered with 4x4 and secured with medipore tape.
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
## 2014-09-24 NOTE — ED Notes (Signed)
Pt. Is agitated and unable to draw his blood at this time.

## 2014-09-24 NOTE — ED Notes (Signed)
2cm abrasion to left lower leg. Wound with granulated tissue and scant sero-sang drainage. Peri wound area intact with no signs of infection noted. Cleansed with NS, bactroban applied, covered with folded 4x4 and kerlix affixed with tape. Tolerated as well as can be expected and no complaint of pain.

## 2014-09-24 NOTE — ED Notes (Signed)
Pt. Is dry

## 2014-09-25 DIAGNOSIS — F6089 Other specific personality disorders: Secondary | ICD-10-CM

## 2014-09-25 NOTE — ED Notes (Signed)
Awaiting [facility] placement

## 2014-09-25 NOTE — Progress Notes (Signed)
[facility]. Possible  Bed this week.   Byrd HesselbachKristen Hayla Hinger, LCSW [phone]  ED CSW 09/25/2014 842am

## 2014-09-25 NOTE — ED Notes (Signed)
Pt's mother, Helmut Musterlicia called to check on the pt. Pt's mother asked if the doctor could call to notify her of pt's condition. Pt's mother can be reached at [phone].

## 2014-09-25 NOTE — Consult Note (Signed)
[facility] Face-to-Face Psychiatry Consult   Reason for referral:  Agitation  Referring Physician:  EDP  Cory [name] is an 33 y.o. male. Total Time spent with patient: 25 minutes  Assessment: AXIS I:  Cognitive Deficits, Traumatic Brain Injury AXIS II:  Deferred AXIS III:   Past Medical History  Diagnosis Date  . Depression     Pt was planning to start taking abilify 10/20  . Traumatic brain injury   . Seizures   . Bipolar 1 disorder    AXIS IV:  housing problems, other psychosocial or environmental problems, problems related to social environment and problems with primary support group AXIS V:  Serious symptoms  Plan:  Awaiting [place] placement.  Subjective:  Pt seen and evaluated by Earleen Newport, NP. This NP helping with dictation and ED overflow. Per above provider, pt is more depressed today, non-cooperative with staff, and in a deteriorating condition consistent with admission criteria. Pt to remain on [facility] wait list for inpatient psychiatric stabilization.   HPI:  Patient continues to curse at staff with lability of mood and aggression.  Thorazine IM PRN does seem to be working better for him with minimal PRN medications needed.  Review of Systems  HENT: Negative for congestion.   Respiratory: Negative for cough, shortness of breath and wheezing.   Gastrointestinal: Negative for nausea, vomiting, abdominal pain, diarrhea and constipation.       Incont.  bowel   Genitourinary:       Incont. bladder   Skin:       Pt has slightly enlarging wound to left proximal lower leg. Is 2cm abrasion, small amount of drainage. No cellulitis. Wound culture postive for MRSA  Neurological: Positive for seizures (history  only). Negative for dizziness, tremors and headaches.  Psychiatric/Behavioral: Negative for depression, suicidal ideas, hallucinations and memory loss. The patient is not nervous/anxious and does not have insomnia.   All other systems reviewed and are negative.   Past  Psychiatric History: Past Medical History  Diagnosis Date  . Depression     Pt was planning to start taking abilify 10/20  . Traumatic brain injury   . Seizures   . Bipolar 1 disorder     reports that he has quit smoking. He does not have any smokeless tobacco history on file. He reports that he does not drink alcohol or use illicit drugs. History reviewed. No pertinent family history.         Allergies:  No Known Allergies  ACT Assessment Complete:  Yes:    Educational Status    Risk to Self: Risk to self with the past 6 months Is patient at risk for suicide?: No Substance abuse history and/or treatment for substance abuse?: No  Risk to Others:    Abuse:    Prior Inpatient Therapy:    Prior Outpatient Therapy:    Additional Information:          Objective: Blood pressure 96/64, pulse 87, temperature 97.5 F (36.4 C), temperature source Oral, resp. rate 16, weight 69.083 kg (152 lb 4.8 oz), SpO2 97 %.Body mass index is 26.99 kg/(m^2). Results for orders placed or performed during the hospital encounter of 07/28/14 (from the past 72 hour(s))  Valproic acid level     Status: None   Collection Time: 09/24/14 11:26 AM  Result Value Ref Range   Valproic Acid Lvl 58.0 50.0 - 100.0 ug/mL    Comment: Performed at [facility]  Comprehensive metabolic panel     Status: Abnormal  Collection Time: 09/24/14 11:27 AM  Result Value Ref Range   Sodium 138 137 - 147 mEq/L   Potassium 4.4 3.7 - 5.3 mEq/L   Chloride 99 96 - 112 mEq/L   CO2 25 19 - 32 mEq/L   Glucose, Bld 84 70 - 99 mg/dL   BUN 13 6 - 23 mg/dL   Creatinine, Ser 1.04 0.50 - 1.35 mg/dL   Calcium 8.4 8.4 - 10.5 mg/dL   Total Protein 8.3 6.0 - 8.3 g/dL   Albumin 3.2 (L) 3.5 - 5.2 g/dL   AST 25 0 - 37 U/L   ALT 13 0 - 53 U/L   Alkaline Phosphatase 67 39 - 117 U/L   Total Bilirubin 0.4 0.3 - 1.2 mg/dL   GFR calc non Af Amer >90 >90 mL/min   GFR calc Af Amer >90 >90 mL/min    Comment: (NOTE) The eGFR has  been calculated using the CKD EPI equation. This calculation has not been validated in all clinical situations. eGFR's persistently <90 mL/min signify possible Chronic Kidney Disease.    Anion gap 14 5 - 15   Labs are reviewed see above values.  Medications reviewed and no changes made.  Current Facility-Administered Medications  Medication Dose Route Frequency Provider Last Rate Last Dose  . acetaminophen (TYLENOL) tablet 650 mg  650 mg Oral Q6H PRN Garald Balding, NP      . alum & mag hydroxide-simeth (MAALOX/MYLANTA) 200-200-20 MG/5ML suspension 30 mL  30 mL Oral PRN Domenic Moras, PA-C      . benztropine (COGENTIN) tablet 1 mg  1 mg Oral BID Waylan Boga, NP   1 mg at 09/25/14 1156  . chlorproMAZINE (THORAZINE) injection 50 mg  50 mg Intramuscular TID PRN Waylan Boga, NP   50 mg at 09/25/14 0428  . divalproex (DEPAKOTE) DR tablet 500 mg  500 mg Oral Daily Waylan Boga, NP   500 mg at 09/25/14 1156  . divalproex (DEPAKOTE) DR tablet 750 mg  750 mg Oral Daily Waylan Boga, NP   750 mg at 09/24/14 1758  . doxycycline (VIBRA-TABS) tablet 100 mg  100 mg Oral Q12H Malvin Johns, MD   100 mg at 09/25/14 1157  . ibuprofen (ADVIL,MOTRIN) tablet 600 mg  600 mg Oral Q6H PRN Garald Balding, NP      . mupirocin cream (BACTROBAN) 2 %   Topical BID Malvin Johns, MD   1 application at 57/01/77 1157  . nicotine (NICODERM CQ - dosed in mg/24 hours) patch 21 mg  21 mg Transdermal Daily Domenic Moras, PA-C   21 mg at 09/24/14 1222  . nicotine polacrilex (NICORETTE) gum 2 mg  2 mg Oral PRN Dara Hoyer, PA-C   2 mg at 08/19/14 0535  . ondansetron (ZOFRAN) tablet 4 mg  4 mg Oral Q8H PRN Domenic Moras, PA-C      . ziprasidone (GEODON) capsule 40 mg  40 mg Oral QPC supper Thekla Colborn   40 mg at 09/24/14 1758   Current Outpatient Prescriptions  Medication Sig Dispense Refill  . acetaminophen (TYLENOL) 325 MG tablet Take 650 mg by mouth every 6 (six) hours as needed for moderate pain.    . divalproex (DEPAKOTE)  500 MG DR tablet Take 1 tablet (500 mg total) by mouth every 12 (twelve) hours. 60 tablet 0  . escitalopram (LEXAPRO) 10 MG tablet Take 1 tablet (10 mg total) by mouth daily. 30 tablet 3  . ibuprofen (ADVIL,MOTRIN) 600 MG tablet Take 600 mg  by mouth every 6 (six) hours as needed for moderate pain.    Marland Kitchen LORazepam (ATIVAN) 1 MG tablet Take 1 tablet (1 mg total) by mouth every 8 (eight) hours as needed for anxiety (agitation). 30 tablet 0  . multivitamin-iron-minerals-folic acid (CENTRUM) chewable tablet Chew 1 tablet by mouth daily.    . QUEtiapine (SEROQUEL) 50 MG tablet Take 1 tablet (50 mg total) by mouth 4 (four) times daily. 150 tablet 0  . traZODone (DESYREL) 100 MG tablet Take 1 tablet (100 mg total) by mouth at bedtime.      Psychiatric Specialty Exam:     Blood pressure 96/64, pulse 87, temperature 97.5 F (36.4 C), temperature source Oral, resp. rate 16, weight 69.083 kg (152 lb 4.8 oz), SpO2 97 %.Body mass index is 26.99 kg/(m^2).  General Appearance: Casual  Eye Contact::  Fair  Speech:  Slurred  Volume:  Normal  Mood:  Irritable  Affect:  Blunt  Thought Process:  Coherent  Orientation:  Other:  Person and place  Thought Content:  "I don't like it here" Shut up, F*&% you, go away"  Suicidal Thoughts:  No  Homicidal Thoughts:  No  Memory:  Immediate;   Poor Recent;   Poor Remote;   Poor  Judgement:  Impaired  Insight:  Lacking  Psychomotor Activity:  Normal  Concentration:  Poor  Recall:  Poor  Fund of Knowledge:Poor  Language: Poor  Akathisia:  No  Handed:  Right  AIMS (if indicated):     Assets:  Social Support  Sleep:      Musculoskeletal: Strength & Muscle Tone: within normal limits Gait & Station: unsteady, shuffle Patient leans: Front  Treatment Plan Summary: Admit to inpatient psychiatry for stability of mood, [place] wait list for violence  Benjamine Mola, FNP-BC 09/25/2014 12:49 PM  Patient seen, evaluated and I agree with notes by Nurse  Practitioner. Corena Pilgrim, MD

## 2014-09-26 NOTE — Progress Notes (Signed)
CSW received a call from Mertie Mooresheryl Millmore who is the Dietitianrogram manager for youth and adult services at [employer]. Elnita MaxwellCheryl informed CSW that [facility] is the Pt's guardian and he is not to be discharged to his mother.  Trish MageBrittney Khylen Riolo, LCSWA [phone] ED CSW 09/26/2014 8:00 PM

## 2014-09-26 NOTE — ED Provider Notes (Signed)
I was notified by nursing of a wound on the patient's left lower extremity. They have orders for dressing changes and bacitracin ointment. She reports that temperature was documented at 100F. There have been no other acute changes. I have examined the wound and find that it is an eroded area with and an old scar. There are 2 eschar/ulcerations present. Each is about 5 mm. There is no cellulitis surrounding this. There is no edema. There is no palpable fluctuance. At this point appropriate management is still consistent with local wound care is being provided by nursing and antibiotic ointment. We will continue to monitor temperatures for increasing fever.  Cory BarretteMarcy Avangeline Stockburger, MD 09/26/14 [id number]

## 2014-09-26 NOTE — Progress Notes (Addendum)
Per RN pt mother is here to see pt. Pt is currently [facility] [place] [facility] [name] [facility] [place] [facility]. CSW called [name] to find out latest rights that patient mother has to patient at this time.   Byrd HesselbachKristen Amanee Iacovelli, LCSW [phone]  ED CSW 09/26/2014 1302pm   Per [name], DSS was appointed interim guardian for patient 09/18/2014.   Virgina Organony Flannery is appointed interim guardian for patient. [name] will call back with contact information.   Pt mother is allowed to visit, however does not have any decision making abilities at this time.   Pt mother came to visit with pt. Pt was pleasant with mother, stated "I love you moma, how have you been." patient states, "I want you to meet my girlfriend. " Pt mother visited for a few minutes and then asked to leave. Pt mother stated it makes her sad to see him in this condition. She hopes to have patient return home when stable.   Byrd HesselbachKristen Sandrina Heaton, LCSW [phone]  ED CSW 09/26/2014 2:10 PM

## 2014-09-26 NOTE — ED Notes (Signed)
Temp 100, pt refused dressing change and antibiotic ointment for wound today, EDP notified, no new orders given at this time.
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
## 2014-09-26 NOTE — ED Notes (Addendum)
Pt yelling out "Mah, Fuck you" and spit at staff.

## 2014-09-26 NOTE — ED Notes (Signed)
When writer went in room to administer medications, pt refused at first spit at writer and swung arm, verbally de-escalated pt, pt cooperated and took PO medications as ordered.

## 2014-09-26 NOTE — ED Notes (Signed)
Pt yelling out and spitting at staff.

## 2014-09-26 NOTE — ED Notes (Addendum)
Pt punching and spitting at staff. Pt not letting staff change diaper. Pt yelling and being verbally aggressive with staff. Pt redirected to bed. Dressing changed below the left knee. Dr.Pfeiffer in the room and was able to see the wound before the dressing was changed.

## 2014-09-26 NOTE — ED Notes (Signed)
I tried to give patient a shower but he was incompetent at this time, will try again later, however he did wash his face

## 2014-09-27 ENCOUNTER — Encounter (HOSPITAL_COMMUNITY): Payer: Self-pay | Admitting: Registered Nurse

## 2014-09-27 NOTE — ED Notes (Signed)
Crackers and juice given, pt watching tv

## 2014-09-27 NOTE — ED Notes (Signed)
CSW into see 

## 2014-09-27 NOTE — ED Notes (Signed)
Sitting on the side of the bed watching tv.  Pt stood and tapped on the window motioning for this Clinical research associatewriter.  Pt declined toileting or to walk, requested writer to stay w/ him.  Pt calm, cooperative, watching tv.

## 2014-09-27 NOTE — Progress Notes (Signed)
CSW confirmed with Cory DresserConnie, [facility].   CSW spoke with pt mother, who is going to bring 3 outfits, including socks and slippers [facility]. Nothing more [facility]   Please call patietn mother when [facility]. Pt does have interim guardian, Cory [name].   Cory HesselbachKristen Jacklynn Dehaas, LCSW [phone]  ED CSW 09/27/2014 909am

## 2014-09-27 NOTE — ED Notes (Signed)
eating breakfast

## 2014-09-27 NOTE — ED Notes (Addendum)
Wound lt leg cleaned w/ NS, bactoban/dressing applied.  Small amt of drainage noted.  Pt tolerated well, calm, cooperative watching tv w/ this Clinical research associatewriter

## 2014-09-27 NOTE — ED Notes (Signed)
shuvon NP into see 

## 2014-09-27 NOTE — ED Notes (Addendum)
Pt declines toileting or to walk around the dept.  Pt remains calm watching tv

## 2014-09-27 NOTE — ED Notes (Signed)
Pt became angry when this writer closed his door and banged on the door.  Pt calmed when the door was opened and staff was talking w/ him.

## 2014-09-27 NOTE — Consult Note (Signed)
[facility]   Reason for referral:  Agitation  Referring Physician:  EDP  Cory [name] is an 33 y.o. male. Total Time spent with patient: 25 minutes  Assessment: AXIS I:  Cognitive Deficits, Traumatic Brain Injury AXIS II:  Deferred AXIS III:   Past Medical History  Diagnosis Date  . Depression     Pt was planning to start taking abilify 10/20  . Traumatic brain injury   . Seizures   . Bipolar 1 disorder    AXIS IV:  housing problems, other psychosocial or environmental problems, problems related to social environment and problems with primary support group AXIS V:  Serious symptoms  Plan:  Awaiting [facility] placement.   HPI:  Patient continues to be irritable and depressed.  Patient yelling at staff; telling to get out of his house, leave me alone, and I hate you, fuck you.    Review of Systems  HENT: Negative for congestion.   Respiratory: Negative for cough, shortness of breath and wheezing.   Gastrointestinal: Negative for nausea, vomiting, abdominal pain, diarrhea and constipation.       Incont.  bowel   Genitourinary:       Incont. bladder   Skin:       Pt has slightly enlarging wound to left proximal lower leg. Is 2cm abrasion, small amount of drainage. No cellulitis. Wound culture postive for MRSA  Neurological: Positive for seizures (history  only). Negative for dizziness, tremors and headaches.  Psychiatric/Behavioral: Negative for depression, suicidal ideas, hallucinations and memory loss. The patient is not nervous/anxious and does not have insomnia.   All other systems reviewed and are negative.   Past Psychiatric History: Past Medical History  Diagnosis Date  . Depression     Pt was planning to start taking abilify 10/20  . Traumatic brain injury   . Seizures   . Bipolar 1 disorder     reports that he has quit smoking. He does not have any smokeless tobacco history on file. He reports that he does not drink alcohol or use  illicit drugs. History reviewed. No pertinent family history.         Allergies:  No Known Allergies  ACT Assessment Complete:  Yes:    Educational Status    Risk to Self: Risk to self with the past 6 months Is patient at risk for suicide?: No Substance abuse history and/or treatment for substance abuse?: No  Risk to Others:    Abuse:    Prior Inpatient Therapy:    Prior Outpatient Therapy:    Additional Information:          Objective: Blood pressure 110/77, pulse 88, temperature 98.6 F (37 C), temperature source Oral, resp. rate 16, weight 69.083 kg (152 lb 4.8 oz), SpO2 98 %.Body mass index is 26.99 kg/(m^2). No results found for this or any previous visit (from the past 72 hour(s)). Labs are reviewed see above values.  Medications reviewed and no changes made.  Current Facility-Administered Medications  Medication Dose Route Frequency Provider Last Rate Last Dose  . acetaminophen (TYLENOL) tablet 650 mg  650 mg Oral Q6H PRN Arman FilterGail K Schulz, NP   650 mg at 09/26/14 1820  . alum & mag hydroxide-simeth (MAALOX/MYLANTA) 200-200-20 MG/5ML suspension 30 mL  30 mL Oral PRN [name], PA-C      . benztropine (COGENTIN) tablet 1 mg  1 mg Oral BID [name], NP   1 mg at 09/27/14 0957  . chlorproMAZINE (THORAZINE)  injection 50 mg  50 mg Intramuscular TID PRN [name], NP   50 mg at 09/26/14 2005  . divalproex (DEPAKOTE) DR tablet 500 mg  500 mg Oral Daily [name], NP   500 mg at 09/27/14 0810  . divalproex (DEPAKOTE) DR tablet 750 mg  750 mg Oral Daily [name], NP   750 mg at 09/26/14 1821  . doxycycline (VIBRA-TABS) tablet 100 mg  100 mg Oral Q12H Rolan BuccoMelanie Belfi, MD   100 mg at 09/27/14 0957  . ibuprofen (ADVIL,MOTRIN) tablet 600 mg  600 mg Oral Q6H PRN Arman FilterGail K Schulz, NP      . mupirocin cream (BACTROBAN) 2 %   Topical BID Rolan BuccoMelanie Belfi, MD      . nicotine (NICODERM CQ - dosed in mg/24 hours) patch 21 mg  21 mg Transdermal Daily [name], PA-C   21 mg at 12015-10-06  1222  . nicotine polacrilex (NICORETTE) gum 2 mg  2 mg Oral PRN [name], PA-C   2 mg at 08/19/14 0535  . ondansetron (ZOFRAN) tablet 4 mg  4 mg Oral Q8H PRN [name], PA-C      . ziprasidone (GEODON) capsule 40 mg  40 mg Oral QPC supper Deryl Ports   40 mg at 09/26/14 1821   Current Outpatient Prescriptions  Medication Sig Dispense Refill  . acetaminophen (TYLENOL) 325 MG tablet Take 650 mg by mouth every 6 (six) hours as needed for moderate pain.    . divalproex (DEPAKOTE) 500 MG DR tablet Take 1 tablet (500 mg total) by mouth every 12 (twelve) hours. 60 tablet 0  . escitalopram (LEXAPRO) 10 MG tablet Take 1 tablet (10 mg total) by mouth daily. 30 tablet 3  . ibuprofen (ADVIL,MOTRIN) 600 MG tablet Take 600 mg by mouth every 6 (six) hours as needed for moderate pain.    Marland Kitchen. LORazepam (ATIVAN) 1 MG tablet Take 1 tablet (1 mg total) by mouth every 8 (eight) hours as needed for anxiety (agitation). 30 tablet 0  . multivitamin-iron-minerals-folic acid (CENTRUM) chewable tablet Chew 1 tablet by mouth daily.    . QUEtiapine (SEROQUEL) 50 MG tablet Take 1 tablet (50 mg total) by mouth 4 (four) times daily. 150 tablet 0  . traZODone (DESYREL) 100 MG tablet Take 1 tablet (100 mg total) by mouth at bedtime.      Psychiatric Specialty Exam:     Blood pressure 110/77, pulse 88, temperature 98.6 F (37 C), temperature source Oral, resp. rate 16, weight 69.083 kg (152 lb 4.8 oz), SpO2 98 %.Body mass index is 26.99 kg/(m^2).  General Appearance: Casual  Eye Contact::  Fair  Speech:  Slurred  Volume:  Normal  Mood:  Irritable  Affect:  Blunt  Thought Process:  Coherent  Orientation:  Other:  Person and place  Thought Content:  "I don't like it here" Shut up, F*&% you, go away"  Suicidal Thoughts:  No  Homicidal Thoughts:  No  Memory:  Immediate;   Poor Recent;   Poor Remote;   Poor  Judgement:  Impaired  Insight:  Lacking  Psychomotor Activity:  Normal  Concentration:  Poor   Recall:  Poor  Fund of Knowledge:Poor  Language: Poor  Akathisia:  No  Handed:  Right  AIMS (if indicated):     Assets:  Social Support  Sleep:      Musculoskeletal: Strength & Muscle Tone: within normal limits Gait & Station: unsteady, shuffle Patient leans: Front  Treatment Plan Summary: Admit to inpatient  psychiatry for stability of mood, [facility] wait list for violence  Will continue with current treatment plan for inpatient stabilization  Rankin, [name], FNP-BC 09/27/2014 4:56 PM  Patient seen, evaluated and I agree with notes by Nurse Practitioner. Thedore Mins, MD
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
## 2014-09-27 NOTE — ED Notes (Signed)
Up in hall w/ staff walking, using the walker

## 2014-09-27 NOTE — ED Notes (Addendum)
Pt up to the bathroom then sitting in chair, eating biscut

## 2014-09-27 NOTE — ED Notes (Addendum)
sitting in hall talking w/ staff, quiet and cooperative.
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
## 2014-09-28 NOTE — ED Notes (Signed)
Per TCU staff-patient remains verbally aggressive and spitting/combative/patient incontinent and cleaned-see previous note

## 2014-09-28 NOTE — ED Notes (Signed)
Patient refused to eat his lunch at this time
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
## 2014-09-28 NOTE — ED Notes (Signed)
Patient yelling  

## 2014-09-28 NOTE — ED Notes (Signed)
Bed: [id number] Expected date:  Expected time:  Means of arrival:  Comments: Hold for TCU

## 2014-09-28 NOTE — ED Notes (Signed)
Patient ate 50% of his food. 

## 2014-09-28 NOTE — Progress Notes (Signed)
[facility] [place] Flann[place] [phone]. He is out of the office from 12/4 to 12/10 but can contact Toya Smothersracy Saunders at [phone] during that time.   Byrd HesselbachKristen Peyten Weare, LCSW [phone]  ED CSW 09/28/2014 10:52 AM

## 2014-09-28 NOTE — ED Notes (Signed)
Care per Katrina and [employer]rystal
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
## 2014-09-29 MED ORDER — LORAZEPAM 2 MG/ML IJ SOLN
2.0000 mg | Freq: Once | INTRAMUSCULAR | Status: AC
  Administered 2014-09-29: 2 mg via INTRAMUSCULAR

## 2014-09-29 MED ORDER — LORAZEPAM 2 MG/ML IJ SOLN
INTRAMUSCULAR | Status: AC
Start: 2014-09-29 — End: 2014-09-29
  Filled 2014-09-29: qty 1

## 2014-09-29 NOTE — ED Notes (Signed)
Pt awake, yelling, cursing at staff, he is screaming at every passing pt or visitor "fuck you" and "i hate you bitch". He is redirected back to his room, however still yelling.

## 2014-09-29 NOTE — ED Provider Notes (Signed)
Pt is yelling at visitors and staff. Saying inappropriate things. Ativan 2 mg IM ordered. He has already had all his PM meds  Cory AlbeIva Znya Albino, MD, Franz DellFACEP   Mirtie Bastyr L Frantz Quattrone, MD 09/29/14 [phone]

## 2014-09-29 NOTE — ED Notes (Signed)
Pt's belongings bag containing shoes moved to locker 29

## 2014-09-30 NOTE — ED Notes (Signed)
Patient is resting comfortably. 

## 2014-09-30 NOTE — Consult Note (Signed)
[facility]   Reason for referral:  Agitation  Referring Physician:  EDP  Cory [name] is an 33 y.o. male. Total Time spent with patient: 30 minutes  Assessment: AXIS I:  Cognitive Deficits, Traumatic Brain Injury AXIS II:  Deferred AXIS III:   Past Medical History  Diagnosis Date  . Depression     Pt was planning to start taking abilify 10/20  . Traumatic brain injury   . Seizures   . Bipolar 1 disorder    AXIS IV:  housing problems, other psychosocial or environmental problems, problems related to social environment and problems with primary support group AXIS V:  Serious symptoms  Plan:  Admit to inpatient psychiatry for stabilization.     HPI:  Patient in bed resting this morning.  Patient continues to holler at men as they pass his room.  Patient continues to curse at staff (leave me alone, I hate you, fuck you, and get out of my house Will continue with current plan for inpatient treatment for stabilization.   Review of Systems  HENT: Negative for congestion.   Respiratory: Negative for cough, shortness of breath and wheezing.   Gastrointestinal: Negative for nausea, vomiting, abdominal pain, diarrhea and constipation.       Incont.  bowel   Genitourinary:       Incont. bladder   Neurological: Positive for seizures. Negative for dizziness, tremors and headaches.  Psychiatric/Behavioral: Negative for depression, suicidal ideas, hallucinations and memory loss. The patient is not nervous/anxious and does not have insomnia.   All other systems reviewed and are negative.   Past Psychiatric History: Past Medical History  Diagnosis Date  . Depression     Pt was planning to start taking abilify 10/20  . Traumatic brain injury   . Seizures   . Bipolar 1 disorder     reports that he has quit smoking. He does not have any smokeless tobacco history on file. He reports that he does not drink alcohol or use illicit drugs. History reviewed. No  pertinent family history.         Allergies:  No Known Allergies  ACT Assessment Complete:  Yes:    Educational Status    Risk to Self: Risk to self with the past 6 months Is patient at risk for suicide?: No Substance abuse history and/or treatment for substance abuse?: No  Risk to Others:    Abuse:    Prior Inpatient Therapy:    Prior Outpatient Therapy:    Additional Information:          Objective: Blood pressure 117/67, pulse 92, temperature 98.8 F (37.1 C), temperature source Oral, resp. rate 20, weight 69.083 kg (152 lb 4.8 oz), SpO2 95 %.Body mass index is 26.99 kg/(m^2). No results found for this or any previous visit (from the past 72 hour(s)). Labs are reviewed see above values.  Medications reviewed and no changes made.  Current Facility-Administered Medications  Medication Dose Route Frequency Provider Last Rate Last Dose  . acetaminophen (TYLENOL) tablet 650 mg  650 mg Oral Q6H PRN Arman FilterGail K Schulz, NP   650 mg at 09/26/14 1820  . alum & mag hydroxide-simeth (MAALOX/MYLANTA) 200-200-20 MG/5ML suspension 30 mL  30 mL Oral PRN [name], PA-C      . benztropine (COGENTIN) tablet 1 mg  1 mg Oral BID [name], NP   1 mg at 09/30/14 1130  . chlorproMAZINE (THORAZINE) injection 50 mg  50 mg Intramuscular TID PRN [name],  NP   50 mg at 09/30/14 0213  . divalproex (DEPAKOTE) DR tablet 500 mg  500 mg Oral Daily [name], NP   500 mg at 09/30/14 1129  . divalproex (DEPAKOTE) DR tablet 750 mg  750 mg Oral Daily [name], NP   750 mg at 09/29/14 1716  . ibuprofen (ADVIL,MOTRIN) tablet 600 mg  600 mg Oral Q6H PRN Arman FilterGail K Schulz, NP      . mupirocin cream (BACTROBAN) 2 %   Topical BID Rolan BuccoMelanie Belfi, MD      . nicotine (NICODERM CQ - dosed in mg/24 hours) patch 21 mg  21 mg Transdermal Daily [name], PA-C   21 mg at 09/30/14 1133  . nicotine polacrilex (NICORETTE) gum 2 mg  2 mg Oral PRN [name], PA-C   2 mg at 08/19/14 0535  . ondansetron (ZOFRAN)  tablet 4 mg  4 mg Oral Q8H PRN [name], PA-C      . ziprasidone (GEODON) capsule 40 mg  40 mg Oral QPC supper Mojeed Akintayo   40 mg at 09/29/14 1943   Current Outpatient Prescriptions  Medication Sig Dispense Refill  . acetaminophen (TYLENOL) 325 MG tablet Take 650 mg by mouth every 6 (six) hours as needed for moderate pain.    . divalproex (DEPAKOTE) 500 MG DR tablet Take 1 tablet (500 mg total) by mouth every 12 (twelve) hours. 60 tablet 0  . escitalopram (LEXAPRO) 10 MG tablet Take 1 tablet (10 mg total) by mouth daily. 30 tablet 3  . ibuprofen (ADVIL,MOTRIN) 600 MG tablet Take 600 mg by mouth every 6 (six) hours as needed for moderate pain.    Marland Kitchen. LORazepam (ATIVAN) 1 MG tablet Take 1 tablet (1 mg total) by mouth every 8 (eight) hours as needed for anxiety (agitation). 30 tablet 0  . multivitamin-iron-minerals-folic acid (CENTRUM) chewable tablet Chew 1 tablet by mouth daily.    . QUEtiapine (SEROQUEL) 50 MG tablet Take 1 tablet (50 mg total) by mouth 4 (four) times daily. 150 tablet 0  . traZODone (DESYREL) 100 MG tablet Take 1 tablet (100 mg total) by mouth at bedtime.      Psychiatric Specialty Exam:     Blood pressure 117/67, pulse 92, temperature 98.8 F (37.1 C), temperature source Oral, resp. rate 20, weight 69.083 kg (152 lb 4.8 oz), SpO2 95 %.Body mass index is 26.99 kg/(m^2).  General Appearance: Casual  Eye Contact::  Fair  Speech:  Slurred  Volume:  Normal  Mood:  Irritable  Affect:  Blunt  Thought Process:  Coherent  Orientation:  Other:  Person and place  Thought Content:  "Fuck you"   Suicidal Thoughts:  No  Homicidal Thoughts:  No  Memory:  Immediate;   Poor Recent;   Poor Remote;   Poor  Judgement:  Impaired  Insight:  Lacking  Psychomotor Activity:  Normal  Concentration:  Poor  Recall:  Poor  Fund of Knowledge:Poor  Language: Poor  Akathisia:  No  Handed:  Right  AIMS (if indicated):     Assets:  Social Support  Sleep:       Musculoskeletal: Strength & Muscle Tone: within normal limits Gait & Station: unsteady, shuffle Patient leans: Front  Treatment Plan Summary: Admit to inpatient psychiatry for stability of mood, [facility] wait list.Continue plan to admit.   Continue with current plan for inpatient treatment.   [name], [name], FNP-BC 09/29/2014 4:28 PM  Patient seen and I agree with treatment and plan Diannia Rudereborah Iylah Dworkin MD  I

## 2014-09-30 NOTE — ED Notes (Signed)
Pt verbally aggressive to staff using profanity and name calling. Pt redirected to reduce volume and continue to watch tv. Pt continues with verbal aggression and escalating with profanity stating "F... you, B...., "shut up", and others. Another RN at bedside to administer medication for agitation. Pt tolerated med admin well. Noticed that pt had soiled sheets. Tech at bedside to clean pt.

## 2014-09-30 NOTE — Consult Note (Signed)
[facility] Face-to-Face [facility]iatry Consult   Reason for referral:  Agitation  Referring Physician:  EDP  Cory [name] is an 33 y.o. male. Total Time spent with patient: 30 minutes  Assessment: AXIS I:  Cognitive Deficits, Traumatic Brain Injury AXIS II:  Deferred AXIS III:   Past Medical History  Diagnosis Date  . Depression     Pt was planning to start taking abilify 10/20  . Traumatic brain injury   . Seizures   . Bipolar 1 disorder    AXIS IV:  housing problems, other psychosocial or environmental problems, problems related to social environment and problems with primary support group AXIS V:  Serious symptoms  Plan:  Admit to inpatient psychiatry for stabilization.    Subjective:  Pt remains psychotic. Continue current plan for inpatient.   HPI:  Patient remains volatile with hitting staff at times, unprovoked and unpredictable.  He frequently hits staff, curses, and is agitated.  Behavior is not improving despite multiple care plans to help.  Awaiting [facility]. Review of Systems  HENT: Negative for congestion.   Respiratory: Negative for cough, shortness of breath and wheezing.   Gastrointestinal: Negative for nausea, vomiting, abdominal pain, diarrhea and constipation.       Incont.  bowel   Genitourinary:       Incont. bladder   Neurological: Positive for seizures. Negative for dizziness, tremors and headaches.  Psychiatric/Behavioral: Negative for depression, suicidal ideas, hallucinations and memory loss. The patient is not nervous/anxious and does not have insomnia.   All other systems reviewed and are negative.   Past Psychiatric History: Past Medical History  Diagnosis Date  . Depression     Pt was planning to start taking abilify 10/20  . Traumatic brain injury   . Seizures   . Bipolar 1 disorder     reports that he has quit smoking. He does not have any smokeless tobacco history on file. He reports that he does not drink alcohol or use illicit drugs. History  reviewed. No pertinent family history.         Allergies:  No Known Allergies  ACT Assessment Complete:  Yes:    Educational Status    Risk to Self: Risk to self with the past 6 months Is patient at risk for suicide?: No Substance abuse history and/or treatment for substance abuse?: No  Risk to Others:    Abuse:    Prior Inpatient Therapy:    Prior Outpatient Therapy:    Additional Information:          Objective: Blood pressure 104/68, pulse 118, temperature 98.7 F (37.1 [name]), temperature source Oral, resp. rate 17, weight 69.083 kg (152 lb 4.8 oz), SpO2 93 %.Body mass index is 26.99 kg/(m^2). No results found for this or any previous visit (from the past 72 hour(s)). Labs are reviewed see above values.  Medications reviewed and no changes made.  Current Facility-Administered Medications  Medication Dose Route Frequency Provider Last Rate Last Dose  . acetaminophen (TYLENOL) tablet 650 mg  650 mg Oral Q6H PRN Arman FilterGail K Schulz, NP   650 mg at 09/26/14 1820  . alum & mag hydroxide-simeth (MAALOX/MYLANTA) 200-200-20 MG/5ML suspension 30 mL  30 mL Oral PRN [name], PA-[name]      . benztropine (COGENTIN) tablet 1 mg  1 mg Oral BID [name], NP   1 mg at 09/29/14 2310  . chlorproMAZINE (THORAZINE) injection 50 mg  50 mg Intramuscular TID PRN [name], NP   50 mg at 09/30/14  [id number]  . divalproex (DEPAKOTE) DR tablet 500 mg  500 mg Oral Daily [name], NP   500 mg at 09/29/14 [id number]  . divalproex (DEPAKOTE) DR tablet 750 mg  750 mg Oral Daily [name], NP   750 mg at 09/29/14 1716  . ibuprofen (ADVIL,MOTRIN) tablet 600 mg  600 mg Oral Q6H PRN Arman FilterGail K Schulz, NP      . mupirocin cream (BACTROBAN) 2 %   Topical BID Rolan BuccoMelanie Belfi, MD      . nicotine (NICODERM CQ - dosed in mg/24 hours) patch 21 mg  21 mg Transdermal Daily [name], PA-[name]   21 mg at 09/29/14 [id number]  . nicotine polacrilex (NICORETTE) gum 2 mg  2 mg Oral PRN [name], PA-[name]   2 mg at 08/19/14 0535  .  ondansetron (ZOFRAN) tablet 4 mg  4 mg Oral Q8H PRN [name], PA-[name]      . ziprasidone (GEODON) capsule 40 mg  40 mg Oral QPC supper Mojeed Akintayo   40 mg at 09/29/14 1943   Current Outpatient Prescriptions  Medication Sig Dispense Refill  . acetaminophen (TYLENOL) 325 MG tablet Take 650 mg by mouth every 6 (six) hours as needed for moderate pain.    . divalproex (DEPAKOTE) 500 MG DR tablet Take 1 tablet (500 mg total) by mouth every 12 (twelve) hours. 60 tablet 0  . escitalopram (LEXAPRO) 10 MG tablet Take 1 tablet (10 mg total) by mouth daily. 30 tablet 3  . ibuprofen (ADVIL,MOTRIN) 600 MG tablet Take 600 mg by mouth every 6 (six) hours as needed for moderate pain.    Marland Kitchen. LORazepam (ATIVAN) 1 MG tablet Take 1 tablet (1 mg total) by mouth every 8 (eight) hours as needed for anxiety (agitation). 30 tablet 0  . multivitamin-iron-minerals-folic acid (CENTRUM) chewable tablet Chew 1 tablet by mouth daily.    . QUEtiapine (SEROQUEL) 50 MG tablet Take 1 tablet (50 mg total) by mouth 4 (four) times daily. 150 tablet 0  . traZODone (DESYREL) 100 MG tablet Take 1 tablet (100 mg total) by mouth at bedtime.      Psychiatric Specialty Exam:     Blood pressure 104/68, pulse 118, temperature 98.7 F (37.1 [name]), temperature source Oral, resp. rate 17, weight 69.083 kg (152 lb 4.8 oz), SpO2 93 %.Body mass index is 26.99 kg/(m^2).  General Appearance: Casual  Eye Contact::  Fair  Speech:  Slurred  Volume:  Normal  Mood:  Irritable  Affect:  Blunt  Thought Process:  Coherent  Orientation:  Other:  Person and place  Thought Content:  "I don't like it here" Shut up, F*&% you, go away"  Suicidal Thoughts:  No  Homicidal Thoughts:  No  Memory:  Immediate;   Poor Recent;   Poor Remote;   Poor  Judgement:  Impaired  Insight:  Lacking  Psychomotor Activity:  Normal  Concentration:  Poor  Recall:  Poor  Fund of Knowledge:Poor  Language: Poor  Akathisia:  No  Handed:  Right  AIMS (if indicated):      Assets:  Social Support  Sleep:      Musculoskeletal: Strength & Muscle Tone: within normal limits Gait & Station: unsteady, shuffle Patient leans: Front  Treatment Plan Summary: Admit to inpatient psychiatry for stability of mood, [facility] wait list.Continue plan to admit.   Cory [name], Cory [name], Cory [name] 09/29/2014 4:28 PM   I Patient seen and I agree with treatment and plan Diannia Rudereborah Laterica Matarazzo MD

## 2014-10-01 MED ORDER — IBUPROFEN 200 MG PO TABS: 600.0000 mg | ORAL_TABLET | Freq: Four times a day (QID) | ORAL | Status: DC | PRN

## 2014-10-01 NOTE — Progress Notes (Signed)
Per Junious Dresseronnie, [facility] At this time patient room due to repair needs is still not available however pt  [facility].   Byrd HesselbachKristen Shyne Resch, LCSW [phone]  ED CSW 10/01/2014 8:24 AM

## 2014-10-01 NOTE — ED Notes (Signed)
Changed dressing to lf knee

## 2014-10-01 NOTE — ED Notes (Signed)
Patient is resting comfortably. 

## 2014-10-01 NOTE — Consult Note (Signed)
[facility]   Reason for referral:  Agitation  Referring Physician:  EDP  Pablo Lawrenceykwan Mauriello is an 33 y.o. male. Total Time spent with patient: 30 minutes  Assessment: AXIS I:  Cognitive Deficits, Traumatic Brain Injury AXIS II:  Deferred AXIS III:   Past Medical History  Diagnosis Date  . Depression     Pt was planning to start taking abilify 10/20  . Traumatic brain injury   . Seizures   . Bipolar 1 disorder    AXIS IV:  housing problems, other psychosocial or environmental problems, problems related to social environment and problems with primary support group AXIS V:  Serious symptoms  Plan:  Admit to inpatient psychiatry for stabilization.     HPI:  Patient in bed resting this morning.  Patient continues to holler at men as they pass his room.  Patient continues to curse at staff (leave me alone, I hate you, fuck you, and get out of my house Will continue with current plan for inpatient treatment for stabilization.   Review of Systems  HENT: Negative for congestion.   Respiratory: Negative for cough, shortness of breath and wheezing.   Gastrointestinal: Negative for nausea, vomiting, abdominal pain, diarrhea and constipation.       Incont.  bowel   Genitourinary:       Incont. bladder   Neurological: Positive for seizures. Negative for dizziness, tremors and headaches.  Psychiatric/Behavioral: Negative for depression, suicidal ideas, hallucinations and memory loss. The patient is not nervous/anxious and does not have insomnia.   All other systems reviewed and are negative.   Past Psychiatric History: Past Medical History  Diagnosis Date  . Depression     Pt was planning to start taking abilify 10/20  . Traumatic brain injury   . Seizures   . Bipolar 1 disorder     reports that he has quit smoking. He does not have any smokeless tobacco history on file. He reports that he does not drink alcohol or use illicit drugs. History reviewed. No  pertinent family history.         Allergies:  No Known Allergies  ACT Assessment Complete:  Yes:    Educational Status    Risk to Self: Risk to self with the past 6 months Is patient at risk for suicide?: No Substance abuse history and/or treatment for substance abuse?: No  Risk to Others:    Abuse:    Prior Inpatient Therapy:    Prior Outpatient Therapy:    Additional Information:          Objective: Blood pressure 93/73, pulse 87, temperature 98.3 F (36.8 C), temperature source Axillary, resp. rate 16, weight 69.083 kg (152 lb 4.8 oz), SpO2 97 %.Body mass index is 26.99 kg/(m^2). No results found for this or any previous visit (from the past 72 hour(s)). Labs are reviewed see above values.  Medications reviewed and no changes made.  Current Facility-Administered Medications  Medication Dose Route Frequency Provider Last Rate Last Dose  . acetaminophen (TYLENOL) tablet 650 mg  650 mg Oral Q6H PRN Arman FilterGail K Schulz, NP   650 mg at 09/26/14 1820  . alum & mag hydroxide-simeth (MAALOX/MYLANTA) 200-200-20 MG/5ML suspension 30 mL  30 mL Oral PRN [name], PA-C      . benztropine (COGENTIN) tablet 1 mg  1 mg Oral BID [name], NP   1 mg at 10/01/14 0947  . chlorproMAZINE (THORAZINE) injection 50 mg  50 mg Intramuscular TID PRN Catha NottinghamJamison  Lord, NP   50 mg at 10/01/14 1201  . divalproex (DEPAKOTE) DR tablet 500 mg  500 mg Oral Daily [name], NP   500 mg at 10/01/14 0947  . divalproex (DEPAKOTE) DR tablet 750 mg  750 mg Oral Daily [name], NP   750 mg at 09/30/14 1839  . ibuprofen (ADVIL,MOTRIN) tablet 600 mg  600 mg Oral Q6H PRN Ward GivensIva L Knapp, MD      . mupirocin cream (BACTROBAN) 2 %   Topical BID Rolan BuccoMelanie Belfi, MD   Stopped at 10/01/14 [id number]  . nicotine (NICODERM CQ - dosed in mg/24 hours) patch 21 mg  21 mg Transdermal Daily [name], PA-C   21 mg at 10/01/14 0959  . nicotine polacrilex (NICORETTE) gum 2 mg  2 mg Oral PRN [name], PA-C   2 mg at 08/19/14 0535  .  ondansetron (ZOFRAN) tablet 4 mg  4 mg Oral Q8H PRN [name], PA-C      . ziprasidone (GEODON) capsule 40 mg  40 mg Oral QPC supper Darcel Frane   40 mg at 09/30/14 1839   Current Outpatient Prescriptions  Medication Sig Dispense Refill  . acetaminophen (TYLENOL) 325 MG tablet Take 650 mg by mouth every 6 (six) hours as needed for moderate pain.    . divalproex (DEPAKOTE) 500 MG DR tablet Take 1 tablet (500 mg total) by mouth every 12 (twelve) hours. 60 tablet 0  . escitalopram (LEXAPRO) 10 MG tablet Take 1 tablet (10 mg total) by mouth daily. 30 tablet 3  . ibuprofen (ADVIL,MOTRIN) 600 MG tablet Take 600 mg by mouth every 6 (six) hours as needed for moderate pain.    Marland Kitchen. LORazepam (ATIVAN) 1 MG tablet Take 1 tablet (1 mg total) by mouth every 8 (eight) hours as needed for anxiety (agitation). 30 tablet 0  . multivitamin-iron-minerals-folic acid (CENTRUM) chewable tablet Chew 1 tablet by mouth daily.    . QUEtiapine (SEROQUEL) 50 MG tablet Take 1 tablet (50 mg total) by mouth 4 (four) times daily. 150 tablet 0  . traZODone (DESYREL) 100 MG tablet Take 1 tablet (100 mg total) by mouth at bedtime.      Psychiatric Specialty Exam:     Blood pressure 93/73, pulse 87, temperature 98.3 F (36.8 C), temperature source Axillary, resp. rate 16, weight 69.083 kg (152 lb 4.8 oz), SpO2 97 %.Body mass index is 26.99 kg/(m^2).  General Appearance: Casual  Eye Contact::  Fair  Speech:  Slurred  Volume:  Normal  Mood:  Irritable  Affect:  Blunt  Thought Process:  Coherent  Orientation:  Other:  Person and place  Thought Content:  "Fuck you"  "i want to leave"  Suicidal Thoughts:  No  Homicidal Thoughts:  No  Memory:  Immediate;   Poor Recent;   Poor Remote;   Poor  Judgement:  Impaired  Insight:  Lacking  Psychomotor Activity:  Normal  Concentration:  Poor  Recall:  Poor  Fund of Knowledge:Poor  Language: Poor  Akathisia:  No  Handed:  Right  AIMS (if indicated):     Assets:  Social  Support  Sleep:      Musculoskeletal: Strength & Muscle Tone: within normal limits Gait & Station: unsteady, shuffle Patient leans: Front  Treatment Plan Summary: Admit to inpatient psychiatry for stability of mood, [facility] wait list.Continue plan to admit.   Continue with current plan for inpatient treatment.   Bonnetta Barryisbach, Shelly, PMH-NP 09/29/2014 4:28 PM   I   Patient  seen, evaluated and I agree with notes by Nurse Practitioner. Thedore MinsMojeed Alianna Wurster, MD

## 2014-10-02 ENCOUNTER — Encounter (HOSPITAL_COMMUNITY): Payer: Self-pay | Admitting: Registered Nurse

## 2014-10-02 NOTE — Progress Notes (Signed)
Per Junious Dresseronnie, [facility].   CSW spoke with Danton ClapLynn Beady [phone] regarding updates. Ms. Daune PerchBeady along with care coordinators [facility] and [facility].   Byrd HesselbachKristen Maie Kesinger, LCSW [phone]  ED CSW 10/02/2014 9:05 AM

## 2014-10-02 NOTE — ED Notes (Signed)
Pt. was check by the RN Crystal pt was dry and bed was dry.

## 2014-10-02 NOTE — ED Notes (Signed)
Pt and bed sheets changed by NT and RN
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
## 2014-10-02 NOTE — ED Notes (Signed)
Patient has been cooperative today.  He continues to have outbursts of profanity which is usual for him.  He is content with staff sits with him and watches TV and talks with him.  Patient has been resting this morning, as he did not sleep last night.

## 2014-10-02 NOTE — Consult Note (Signed)
[facility]   Reason for referral:  Agitation  Referring Physician:  EDP  Cory [name] is an 33 y.o. male. Total Time spent with patient: 30 minutes  Assessment: AXIS I:  Cognitive Deficits, Traumatic Brain Injury AXIS II:  Deferred AXIS III:   Past Medical History  Diagnosis Date  . Depression     Pt was planning to start taking abilify 10/20  . Traumatic brain injury   . Seizures   . Bipolar 1 disorder    AXIS IV:  housing problems, other psychosocial or environmental problems, problems related to social environment and problems with primary support group AXIS V:  Serious symptoms  Plan:  Admit to inpatient psychiatry for stabilization.     HPI:  Patient sitting on side of bed eating breakfast.  Patient is calm at this time.  Patient continues to have times of aggressive behavior and cursing.  Will continue to monitor until inpatient treatment bed has been found.   Review of Systems  HENT: Negative for congestion.   Respiratory: Negative for cough, shortness of breath and wheezing.   Gastrointestinal: Negative for nausea, vomiting, abdominal pain, diarrhea and constipation.       Incont.  bowel   Genitourinary:       Incont. bladder   Neurological: Positive for seizures. Negative for dizziness, tremors and headaches.  Psychiatric/Behavioral: Negative for depression, suicidal ideas, hallucinations and memory loss. The patient is not nervous/anxious and does not have insomnia.   All other systems reviewed and are negative.   Past Psychiatric History: Past Medical History  Diagnosis Date  . Depression     Pt was planning to start taking abilify 10/20  . Traumatic brain injury   . Seizures   . Bipolar 1 disorder     reports that he has quit smoking. He does not have any smokeless tobacco history on file. He reports that he does not drink alcohol or use illicit drugs. History reviewed. No pertinent family history.         Allergies:   No Known Allergies  ACT Assessment Complete:  Yes:    Educational Status    Risk to Self: Risk to self with the past 6 months Is patient at risk for suicide?: No Substance abuse history and/or treatment for substance abuse?: No  Risk to Others:    Abuse:    Prior Inpatient Therapy:    Prior Outpatient Therapy:    Additional Information:          Objective: Blood pressure 93/67, pulse 94, temperature 98.4 F (36.9 C), temperature source Oral, resp. rate 20, weight 69.083 kg (152 lb 4.8 oz), SpO2 99 %.Body mass index is 26.99 kg/(m^2). No results found for this or any previous visit (from the past 72 hour(s)). Labs are reviewed see above values.  Medications reviewed and no changes made.  Current Facility-Administered Medications  Medication Dose Route Frequency Provider Last Rate Last Dose  . acetaminophen (TYLENOL) tablet 650 mg  650 mg Oral Q6H PRN Arman FilterGail K Schulz, NP   650 mg at 09/26/14 1820  . alum & mag hydroxide-simeth (MAALOX/MYLANTA) 200-200-20 MG/5ML suspension 30 mL  30 mL Oral PRN [name], PA-C      . benztropine (COGENTIN) tablet 1 mg  1 mg Oral BID [name], NP   1 mg at 10/02/14 0751  . chlorproMAZINE (THORAZINE) injection 50 mg  50 mg Intramuscular TID PRN [name], NP   50 mg at 10/01/14 1201  .  divalproex (DEPAKOTE) DR tablet 500 mg  500 mg Oral Daily [name], NP   500 mg at 10/02/14 0751  . divalproex (DEPAKOTE) DR tablet 750 mg  750 mg Oral Daily [name], NP   750 mg at 10/01/14 1729  . ibuprofen (ADVIL,MOTRIN) tablet 600 mg  600 mg Oral Q6H PRN Ward GivensIva L Knapp, MD      . mupirocin cream (BACTROBAN) 2 %   Topical BID Rolan BuccoMelanie Belfi, MD      . nicotine (NICODERM CQ - dosed in mg/24 hours) patch 21 mg  21 mg Transdermal Daily [name], PA-C   21 mg at 10/01/14 0959  . nicotine polacrilex (NICORETTE) gum 2 mg  2 mg Oral PRN [name], PA-C   2 mg at 08/19/14 0535  . ondansetron (ZOFRAN) tablet 4 mg  4 mg Oral Q8H PRN [name], PA-C      .  ziprasidone (GEODON) capsule 40 mg  40 mg Oral QPC supper Emelio Schneller   40 mg at 10/01/14 1727   Current Outpatient Prescriptions  Medication Sig Dispense Refill  . acetaminophen (TYLENOL) 325 MG tablet Take 650 mg by mouth every 6 (six) hours as needed for moderate pain.    . divalproex (DEPAKOTE) 500 MG DR tablet Take 1 tablet (500 mg total) by mouth every 12 (twelve) hours. 60 tablet 0  . escitalopram (LEXAPRO) 10 MG tablet Take 1 tablet (10 mg total) by mouth daily. 30 tablet 3  . ibuprofen (ADVIL,MOTRIN) 600 MG tablet Take 600 mg by mouth every 6 (six) hours as needed for moderate pain.    Marland Kitchen. LORazepam (ATIVAN) 1 MG tablet Take 1 tablet (1 mg total) by mouth every 8 (eight) hours as needed for anxiety (agitation). 30 tablet 0  . multivitamin-iron-minerals-folic acid (CENTRUM) chewable tablet Chew 1 tablet by mouth daily.    . QUEtiapine (SEROQUEL) 50 MG tablet Take 1 tablet (50 mg total) by mouth 4 (four) times daily. 150 tablet 0  . traZODone (DESYREL) 100 MG tablet Take 1 tablet (100 mg total) by mouth at bedtime.      Psychiatric Specialty Exam:     Blood pressure 93/67, pulse 94, temperature 98.4 F (36.9 C), temperature source Oral, resp. rate 20, weight 69.083 kg (152 lb 4.8 oz), SpO2 99 %.Body mass index is 26.99 kg/(m^2).  General Appearance: Casual  Eye Contact::  Fair  Speech:  Slurred  Volume:  Normal  Mood:  Irritable  Affect:  Blunt  Thought Process:  Coherent  Orientation:  Other:  Person and place  Thought Content:  "Fuck you"  "i want to leave"  Suicidal Thoughts:  No  Homicidal Thoughts:  No  Memory:  Immediate;   Poor Recent;   Poor Remote;   Poor  Judgement:  Impaired  Insight:  Lacking  Psychomotor Activity:  Normal  Concentration:  Poor  Recall:  Poor  Fund of Knowledge:Poor  Language: Poor  Akathisia:  No  Handed:  Right  AIMS (if indicated):     Assets:  Social Support  Sleep:      Musculoskeletal: Strength & Muscle Tone: within normal  limits Gait & Station: unsteady, shuffle Patient leans: Front  Treatment Plan Summary: Admit to inpatient psychiatry for stability of mood, [facility] wait list.Continue plan to admit.   [facility] has to be repaired.   [name], [name], FNP-BC 09/29/2014 4:28 PM    Patient seen, evaluated and I agree with notes by  Nurse Practitioner. Corena Pilgrim, MD

## 2014-10-03 MED ORDER — ZIPRASIDONE MESYLATE 20 MG IM SOLR
10.0000 mg | Freq: Once | INTRAMUSCULAR | Status: AC
  Administered 2014-10-03: 10 mg via INTRAMUSCULAR
  Filled 2014-10-03: qty 20

## 2014-10-03 NOTE — ED Notes (Signed)
Pt has been verbally and physically aggressive all day and has not settled down at all. Not after his bath or after re diverting him and giving him some fresh air.

## 2014-10-03 NOTE — ED Notes (Signed)
Nurse, security guard and myself took Cory [name] outside for air, and around the hospital to get a new scenery. Hoping to help calm patient down and review new stimulation.  Snacks were given as well before heading out around the hospital.
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
## 2014-10-03 NOTE — Progress Notes (Signed)
Per Britt[facility]cc[facility]Barbara [facility]   Byrd HesselbachKristen Sheriann Newmann, LCSW [phone]  ED CSW 10/03/2014 8:07 AM
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
## 2014-10-03 NOTE — ED Notes (Signed)
  Tried calming patient down with tv and giving him drinks
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
## 2014-10-03 NOTE — ED Notes (Signed)
Patient is being violent
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
## 2014-10-04 NOTE — ED Notes (Signed)
Sitter gave pt remote and TV privileges back. Pt calm and cooperative, watching TV.
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
## 2014-10-04 NOTE — ED Notes (Signed)
Pt. Was incontinent of urine and bowel. Pt. Was changed into new brief and scrubs. Nurse cleaned bed and changed linens while tech cleaned pt.

## 2014-10-04 NOTE — ED Notes (Signed)
Bed: WA29 Expected date:  Expected time:  Means of arrival:  Comments: 
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
## 2014-10-04 NOTE — Progress Notes (Signed)
[facility].   Byrd HesselbachKristen Sonora Catlin, LCSW [phone]  ED CSW 10/04/2014 8:13am

## 2014-10-05 NOTE — ED Notes (Signed)
Sheriff called for transport  
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
## 2014-10-05 NOTE — ED Notes (Signed)
Sheriff dept called back to report that pt transport would occur in "at least a couple of hours."

## 2014-10-05 NOTE — Consult Note (Signed)
[facility]   Reason for referral:  Agitation  Referring Physician:  EDP  Cory [name] is an 33 y.o. male. Total Time spent with patient: 30 minutes  Assessment: AXIS I:  Cognitive Deficits, Traumatic Brain Injury AXIS II:  Deferred AXIS III:   Past Medical History  Diagnosis Date  . Depression     Pt was planning to start taking abilify 10/20  . Traumatic brain injury   . Seizures   . Bipolar 1 disorder    AXIS IV:  housing problems, other psychosocial or environmental problems, problems related to social environment and problems with primary support group AXIS V:  Serious symptoms  Plan[facility]  [facility]  HPI:  [facility]  His mood and agitation remain constant.   Review of Systems  Respiratory: Negative for cough, shortness of breath and wheezing.   Gastrointestinal:       Incont.  bowel   Genitourinary:       Incont. bladder   Neurological: Positive for seizures. Negative for headaches.  Psychiatric/Behavioral: Negative for depression, suicidal ideas and hallucinations. The patient is not nervous/anxious.   All other systems reviewed and are negative.   Past Psychiatric History: Past Medical History  Diagnosis Date  . Depression     Pt was planning to start taking abilify 10/20  . Traumatic brain injury   . Seizures   . Bipolar 1 disorder     reports that he has quit smoking. He does not have any smokeless tobacco history on file. He reports that he does not drink alcohol or use illicit drugs. History reviewed. No pertinent family history.         Allergies:  No Known Allergies  ACT Assessment Complete:  Yes:    Educational Status    Risk to Self: Risk to self with the past 6 months Is patient at risk for suicide?: No Substance abuse history and/or treatment for substance abuse?: No  Risk to Others:    Abuse:    Prior Inpatient Therapy:    Prior Outpatient  Therapy:    Additional Information:          Objective: Blood pressure 108/69, pulse 94, temperature 98.6 F (37 C), temperature source Oral, resp. rate 18, weight 152 lb 4.8 oz (69.083 kg), SpO2 93 %.Body mass index is 26.99 kg/(m^2). No results found for this or any previous visit (from the past 72 hour(s)). Labs are reviewed see above values.  Medications reviewed and no changes made.  Current Facility-Administered Medications  Medication Dose Route Frequency Provider Last Rate Last Dose  . acetaminophen (TYLENOL) tablet 650 mg  650 mg Oral Q6H PRN Arman FilterGail K Schulz, NP   650 mg at 09/26/14 1820  . alum & mag hydroxide-simeth (MAALOX/MYLANTA) 200-200-20 MG/5ML suspension 30 mL  30 mL Oral PRN [name], PA-C      . benztropine (COGENTIN) tablet 1 mg  1 mg Oral BID [name], NP   1 mg at 10/05/14 1237  . chlorproMAZINE (THORAZINE) injection 50 mg  50 mg Intramuscular TID PRN [name], NP   50 mg at 10/05/14 0736  . divalproex (DEPAKOTE) DR tablet 500 mg  500 mg Oral Daily [name], NP   500 mg at 10/05/14 0736  . divalproex (DEPAKOTE) DR tablet 750 mg  750 mg Oral Daily [name], NP   750 mg at 10/04/14 1714  . ibuprofen (ADVIL,MOTRIN) tablet 600 mg  600  mg Oral Q6H PRN Ward GivensIva L Knapp, MD      . mupirocin cream (BACTROBAN) 2 %   Topical BID Rolan BuccoMelanie Belfi, MD      . nicotine (NICODERM CQ - dosed in mg/24 hours) patch 21 mg  21 mg Transdermal Daily [name], PA-C   21 mg at 10/04/14 [id number]  . nicotine polacrilex (NICORETTE) gum 2 mg  2 mg Oral PRN [name], PA-C   2 mg at 10/04/14 0008  . ondansetron (ZOFRAN) tablet 4 mg  4 mg Oral Q8H PRN [name], PA-C      . ziprasidone (GEODON) capsule 40 mg  40 mg Oral QPC supper Aiyla Baucom   40 mg at 10/04/14 1730   Current Outpatient Prescriptions  Medication Sig Dispense Refill  . acetaminophen (TYLENOL) 325 MG tablet Take 650 mg by mouth every 6 (six) hours as needed for moderate pain.    . divalproex (DEPAKOTE) 500 MG  DR tablet Take 1 tablet (500 mg total) by mouth every 12 (twelve) hours. 60 tablet 0  . escitalopram (LEXAPRO) 10 MG tablet Take 1 tablet (10 mg total) by mouth daily. 30 tablet 3  . ibuprofen (ADVIL,MOTRIN) 600 MG tablet Take 600 mg by mouth every 6 (six) hours as needed for moderate pain.    Marland Kitchen. LORazepam (ATIVAN) 1 MG tablet Take 1 tablet (1 mg total) by mouth every 8 (eight) hours as needed for anxiety (agitation). 30 tablet 0  . multivitamin-iron-minerals-folic acid (CENTRUM) chewable tablet Chew 1 tablet by mouth daily.    . QUEtiapine (SEROQUEL) 50 MG tablet Take 1 tablet (50 mg total) by mouth 4 (four) times daily. 150 tablet 0  . traZODone (DESYREL) 100 MG tablet Take 1 tablet (100 mg total) by mouth at bedtime.      Psychiatric Specialty Exam:     Blood pressure 108/69, pulse 94, temperature 98.6 F (37 C), temperature source Oral, resp. rate 18, weight 152 lb 4.8 oz (69.083 kg), SpO2 93 %.Body mass index is 26.99 kg/(m^2).  General Appearance: Casual  Eye Contact::  Fair  Speech:  Slurred  Volume:  Normal  Mood:  Irritable, agitated, angry  Affect:  Blunt  Thought Process:  Coherent  Orientation:  Other:  Person and place  Thought Content:  "Hate you; get the fuck out"    Suicidal Thoughts:  No  Homicidal Thoughts:  No  Memory:  Immediate;   Poor Recent;   Poor Remote;   Poor  Judgement:  Impaired  Insight:  Lacking  Psychomotor Activity:  Normal  Concentration:  Poor  Recall:  Poor  Fund of Knowledge:Poor  Language: Poor  Akathisia:  No  Handed:  Right  AIMS (if indicated):     Assets:  Social Support  Sleep:      Musculoskeletal: Strength & Muscle Tone: within normal limits Gait & Station: unsteady, shuffle Patient leans: Front  [facility] Plan Summary[facility]  Nanine MeansLORD, JAMISON, PMH-NP 09/29/2014 4:28 PM    Patient seen, evaluated and I agree with notes by Nurse Practitioner. Thedore MinsMojeed Grae Leathers, MD

## 2014-10-05 NOTE — Progress Notes (Signed)
[facility] requested vitals, meds, and notes for rn review.   Byrd HesselbachKristen Geoffry Bannister, LCSW [phone]  ED CSW 10/05/2014 9:14 AM

## 2014-10-05 NOTE — ED Notes (Addendum)
[employer] in to serve new IVC papers. Baxter HireKristen, SW made aware. Meds pending as pt is sleeping. Will medicate and perform vitals once pt awakens on his own. RR even, unlabored. Sitter remains present.
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
## 2014-10-05 NOTE — ED Notes (Signed)
Pt discharged with sheriff deputy and one personal belongings bag containing one pair of white "croc-type" shoes. Current IVC paperwork, copy of interim state guardianship, facesheet and copy of care plan sent with pt.

## 2014-10-05 NOTE — Progress Notes (Signed)
Per Brett CanalesSteve patient remains on [facility] wait[facility].   Byrd HesselbachKristen Pablo Stauffer, LCSW [phone]  ED CSW 10/05/2014 8:33 AM

## 2014-10-05 NOTE — Progress Notes (Signed)
[facility]. RN arranging transport. CSW informed pt interim guardian Virgina Organony Flannery at [phone] who will be in touch with pt family.   Byrd HesselbachKristen Delanee Xin, LCSW [phone]  ED CSW 10/05/2014 1218pm

## 2014-10-08 NOTE — Progress Notes (Signed)
10/08/2014 A. Bennie DallasFerrero RNCM 1538pm EDCM called [facility] [phone] and spoke to IDA to inform her that patient had been [facility] where he will have access to incontinence supplies. EDCM was informed by EDSW that patient will be on a medical floor and will receive incontince supplies there.  As per Malachi BondsIda, will cancel order.  No further EDCM needs at this time.

## 2014-10-15 ENCOUNTER — Encounter (HOSPITAL_BASED_OUTPATIENT_CLINIC_OR_DEPARTMENT_OTHER): Payer: Medicaid Other | Admitting: Internal Medicine

## 2014-11-07 IMAGING — CT CT HEAD W/O CM
1 series · 14 of 30 positions shown, 18 images · non-contrast
Comparison: 08/11/2013

CLINICAL DATA: Followup intracranial hemorrhage.

EXAM:
CT HEAD WITHOUT CONTRAST
TECHNIQUE: Contiguous axial images were obtained from the base of the skull
through the vertex without intravenous contrast.

[Series 2: head 5.0 h30s · axial · 0.48mm/px · z∈[-212,-82]mm · 14 of 31 slices shown, 18 images]
[im 3/31  brain]
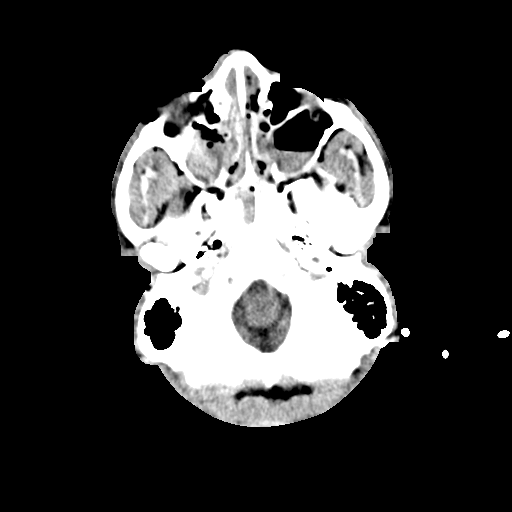
[im 3/31  bone]
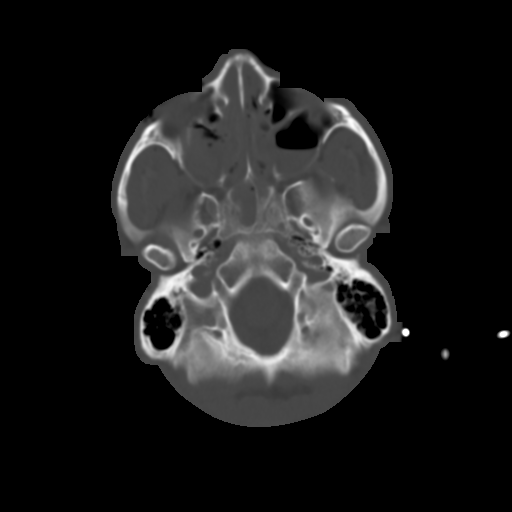
[im 5/31  brain]
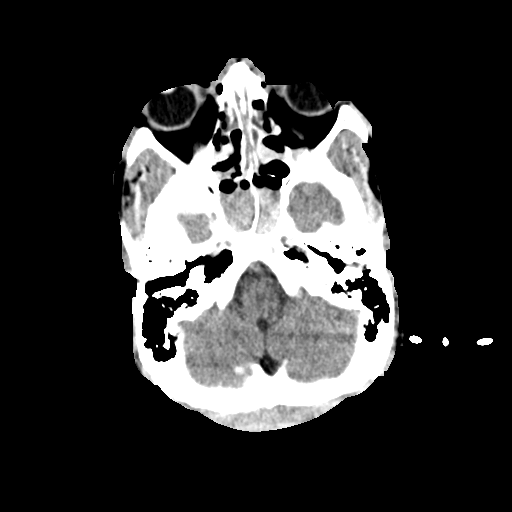
[im 7/31  brain]
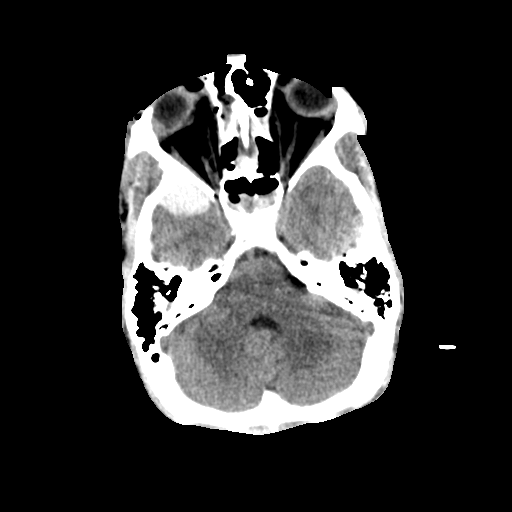
[im 9/31  brain]
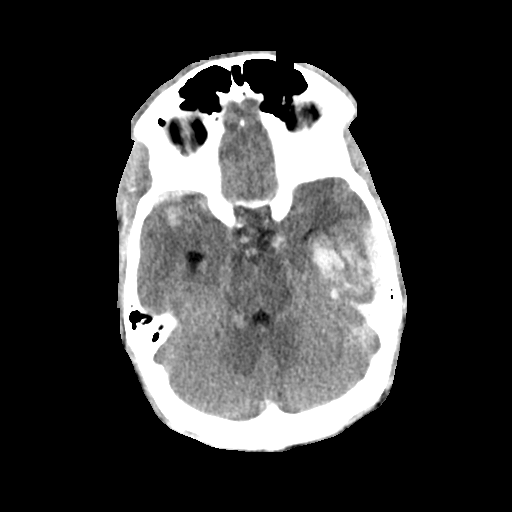
[im 11/31  brain]
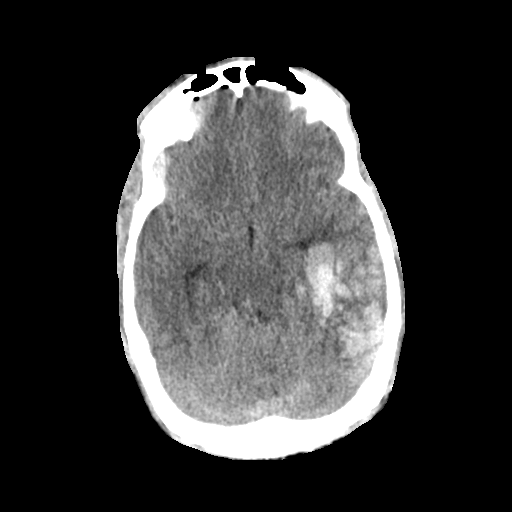
[im 11/31  bone]
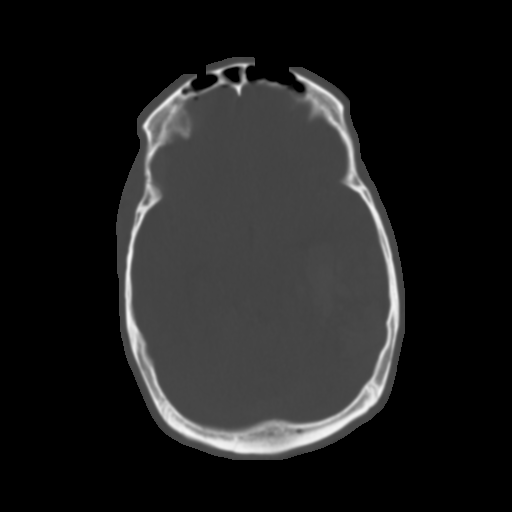
[im 13/31  brain]
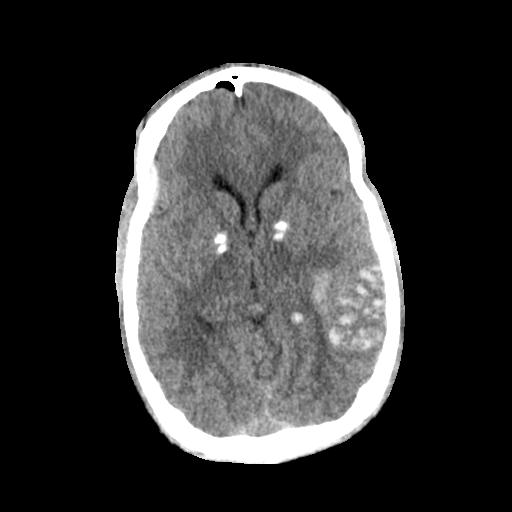
[im 15/31  brain]
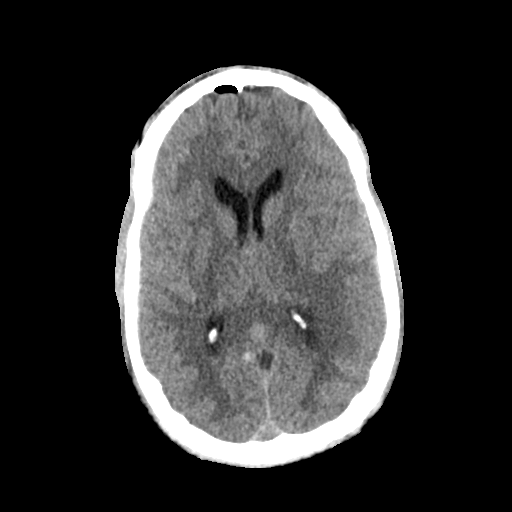
[im 17/31  brain]
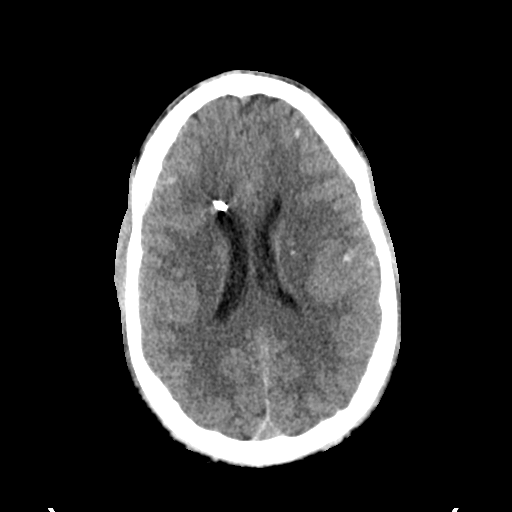
[im 19/31  brain]
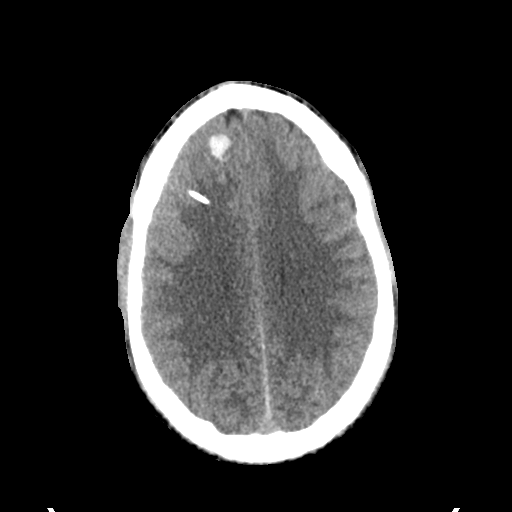
[im 19/31  bone]
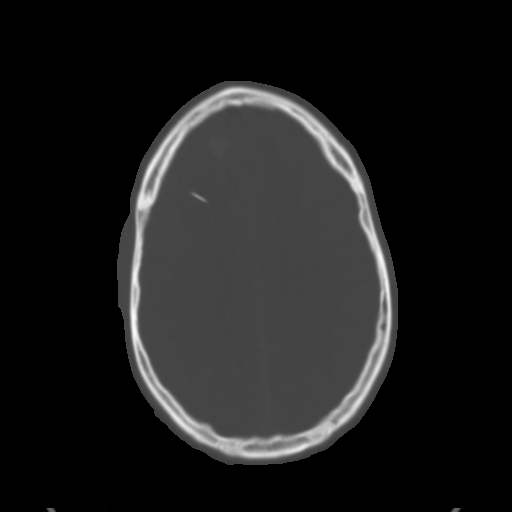
[im 21/31  brain]
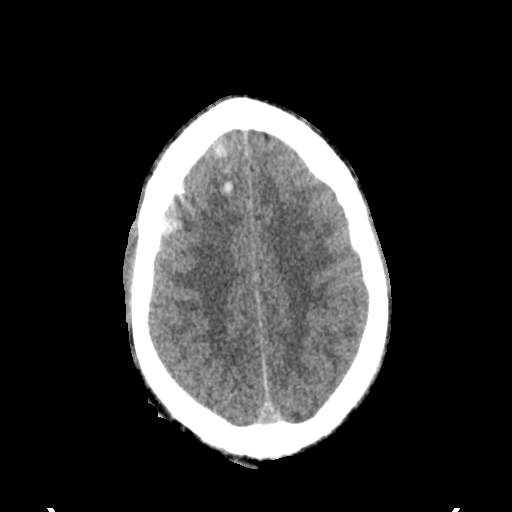
[im 23/31  brain]
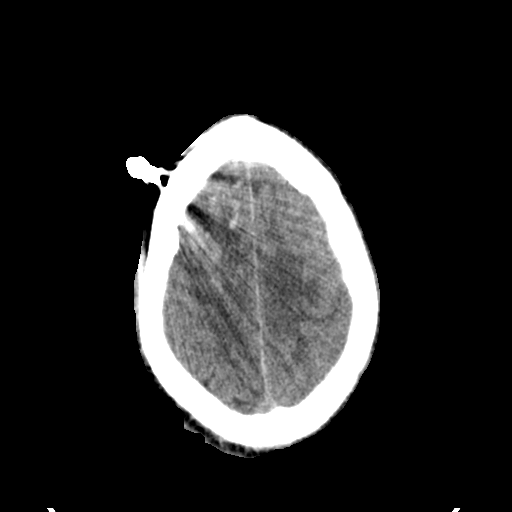
[im 25/31  brain]
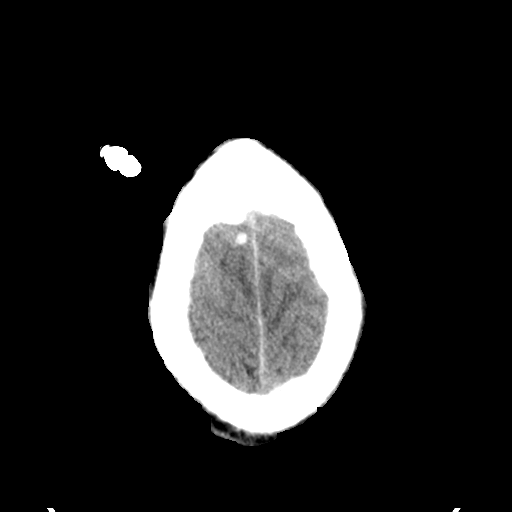
[im 27/31  brain]
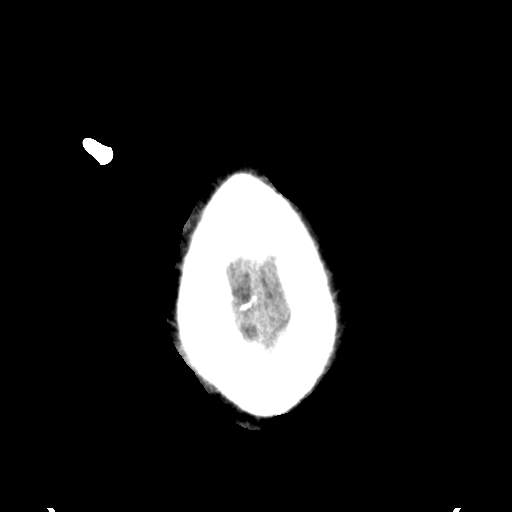
[im 27/31  bone]
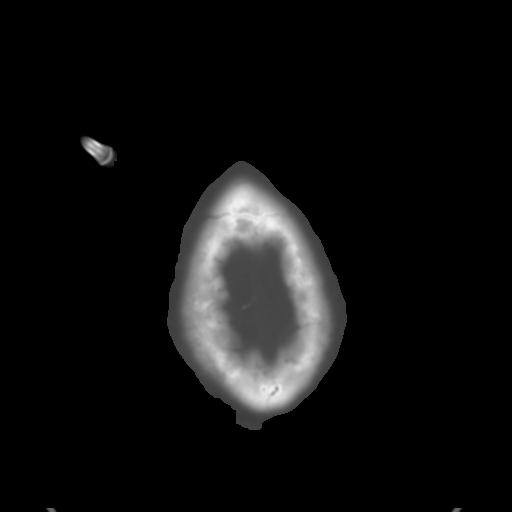
[im 29/31  brain]
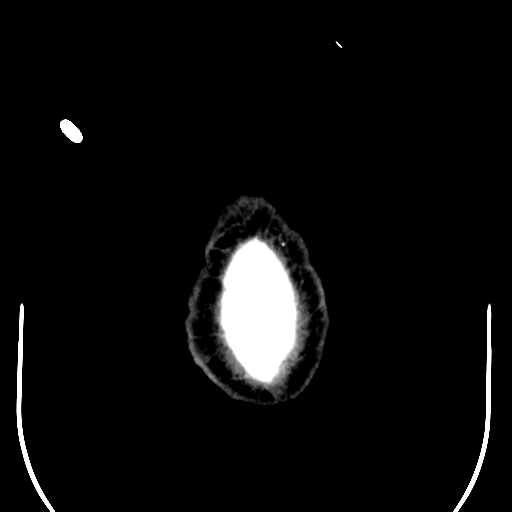

[14 of 30 positions shown; findings below may reference images not displayed]

FINDINGS: Numerous areas of intracranial hemorrhage are again noted. There has
been an increase in hemorrhage in several locations.

Left temporal parenchymal hemorrhage has increased. Foci of right
frontal lobe parenchymal hemorrhage have mildly increased in size.
The epidural hemorrhage anterior to the right temporal lobe is
denser but unchanged in size. There is a focus of extra-axial
hemorrhage adjacent to this along the lateral right anterior cranial
fossa adjacent to the inferolateral right frontal lobe that may also
be epidural. This has also increased in density but is otherwise
stable. There are several scattered foci of additional parenchymal
hemorrhage primarily located along the corticomedullary junctions in
the left frontal lobe. A small area of subdural hemorrhage lies
adjacent to the left temporal lobe this is better defined than on
the previous day study and may be minimally increased.

The basal cisterns surrounding the midbrain are partly effaced, but
there is no herniation evident.

There is now slight midline shift to the right of 2.4 mm.

A right frontal ventriculostomy has been placed since the previous
day's study. The catheter tip lies in the anterior right lateral
ventricle. The ventricles are mildly prominent, but there is no
overt hydrocephalus.

There is non dependent extra-axial air mildly increased from the
previous day's study.

Extensive facial fractures with sinus mucosal thickening and fluid
are essentially unchanged.
IMPRESSION: 1. Extensive intracranial hemorrhage with areas of parenchymal
hemorrhage, subdural hemorrhage as well as an area of epidural
hemorrhage anterior to the right temporal lobe. These areas of
hemorrhage were present previously, but some areas of hemorrhage
have increased, most evident in the left temporal lobe. This has
resulted in a new slight midline shift to the right of 2.4 mm.
2. New right frontal ventriculostomy catheter has its tip in the
anterior right lateral ventricle.
3. Ventricles are mildly prominent but there is no overt
hydrocephalus. No significant change in ventricular size when
compared to the prior exam.
4. Extensive facial fractures are again noted.

## 2014-11-07 IMAGING — CR DG CHEST 1V PORT
1 series · 1 of 1 positions shown · non-contrast
Comparison: Prior chest x-ray 08/11/2013

CLINICAL DATA: Right chest tube

EXAM:
PORTABLE CHEST - 1 VIEW

[AP]
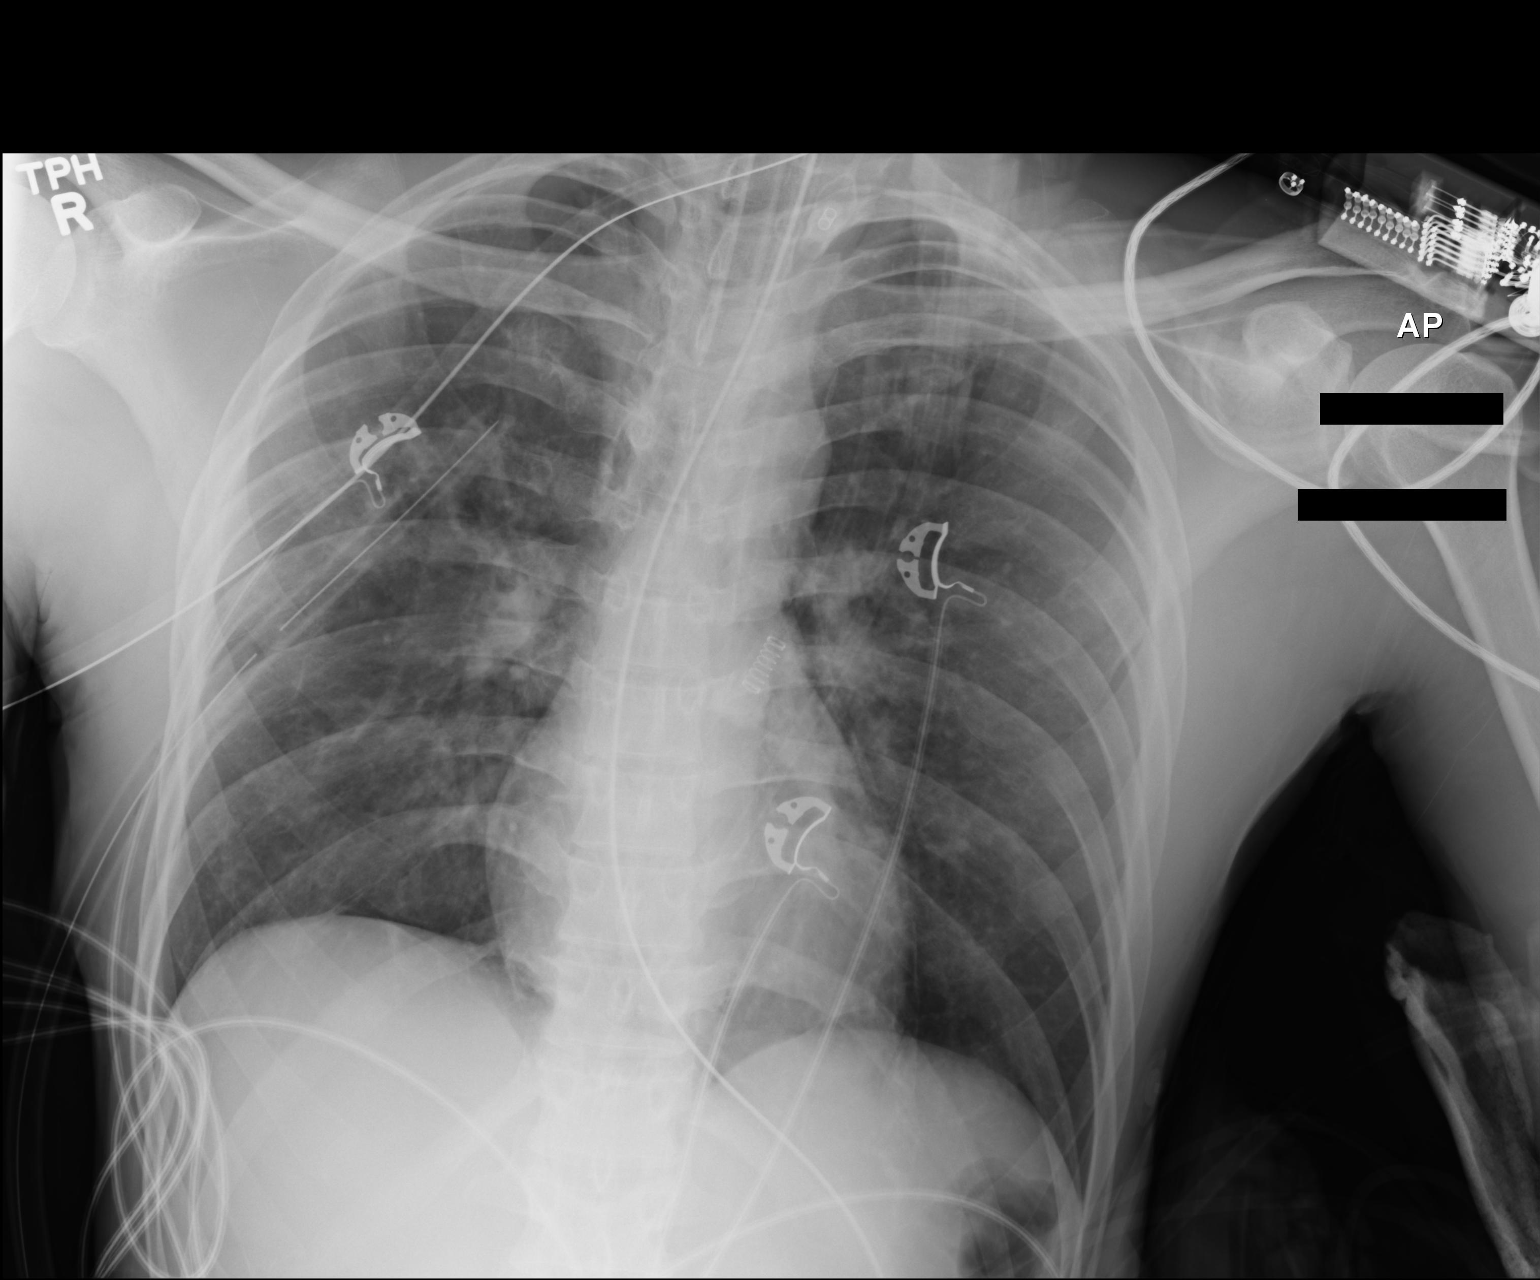

[1 of 1 positions shown; findings below may reference images not displayed]

FINDINGS: Endotracheal tube in good position 4.1 cm above the carinal.
Nasogastric tube remains present, the tip lies below the field of
view, presumably within the stomach. Stable position of right-sided
thoracostomy tube. Significantly decreased basilar pneumothorax.
Trace pleural air remains. Decreasing right-sided perihilar
atelectasis and bilateral pulmonary contusions. The lungs are better
expanded on today's examination. New mediastinal air. No new pleural
effusion. Rib fractures better demonstrated on prior CT.
IMPRESSION: 1. New small pneumomediastinum.
2. Decreasing right pneumothorax. Only trace basilar subpulmonic air
remains. Chest tube remains in place.
3. Improved lung volumes with decreasing bilateral pulmonary
contusions and right perihilar atelectasis.
4. Stable and satisfactory support apparatus.

## 2014-11-08 ENCOUNTER — Encounter (HOSPITAL_COMMUNITY): Payer: Self-pay | Admitting: Orthopedic Surgery

## 2014-11-08 IMAGING — RF DG TIBIA/FIBULA 2V*L*
1 series · 2 of 2 positions shown · non-contrast
Comparison: 08/13/2013

CLINICAL DATA: Tibial fracture, internal fixation

EXAM:
LEFT TIBIA AND FIBULA - 2 VIEW

[Series 1: run · 2 of 2 slices shown]
[im 1/2]
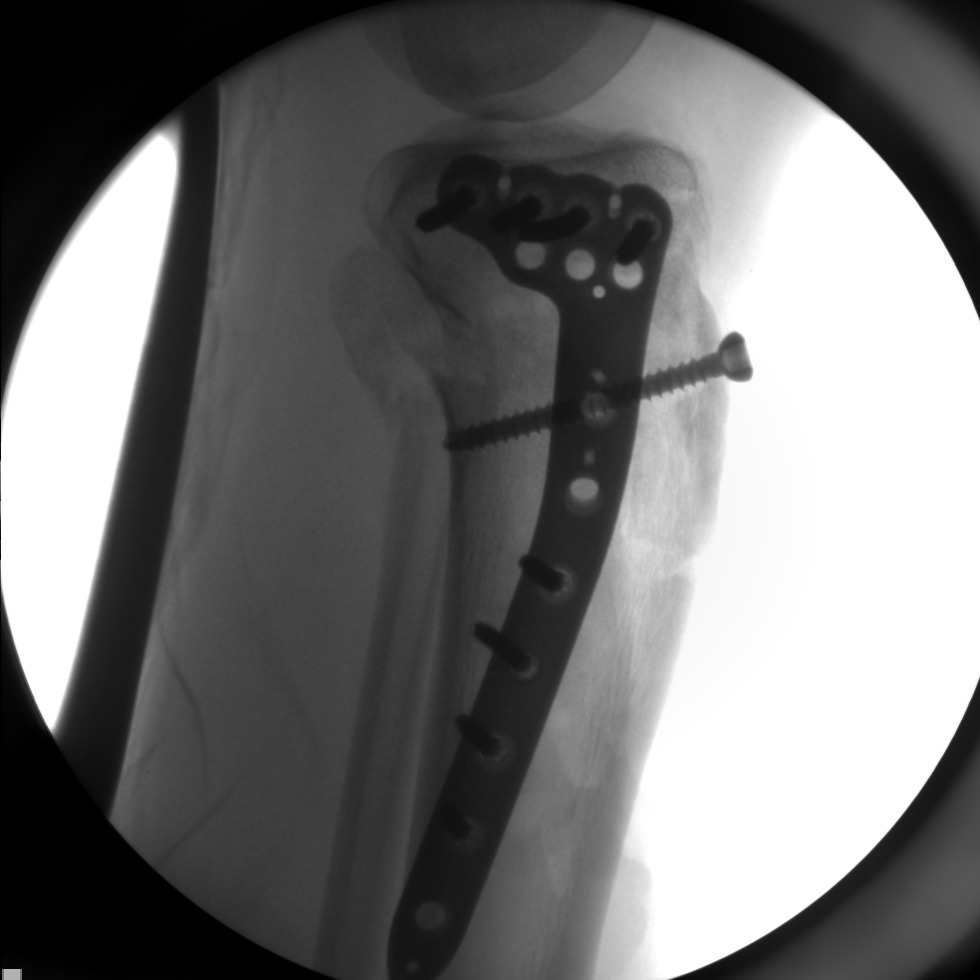
[im 2/2]
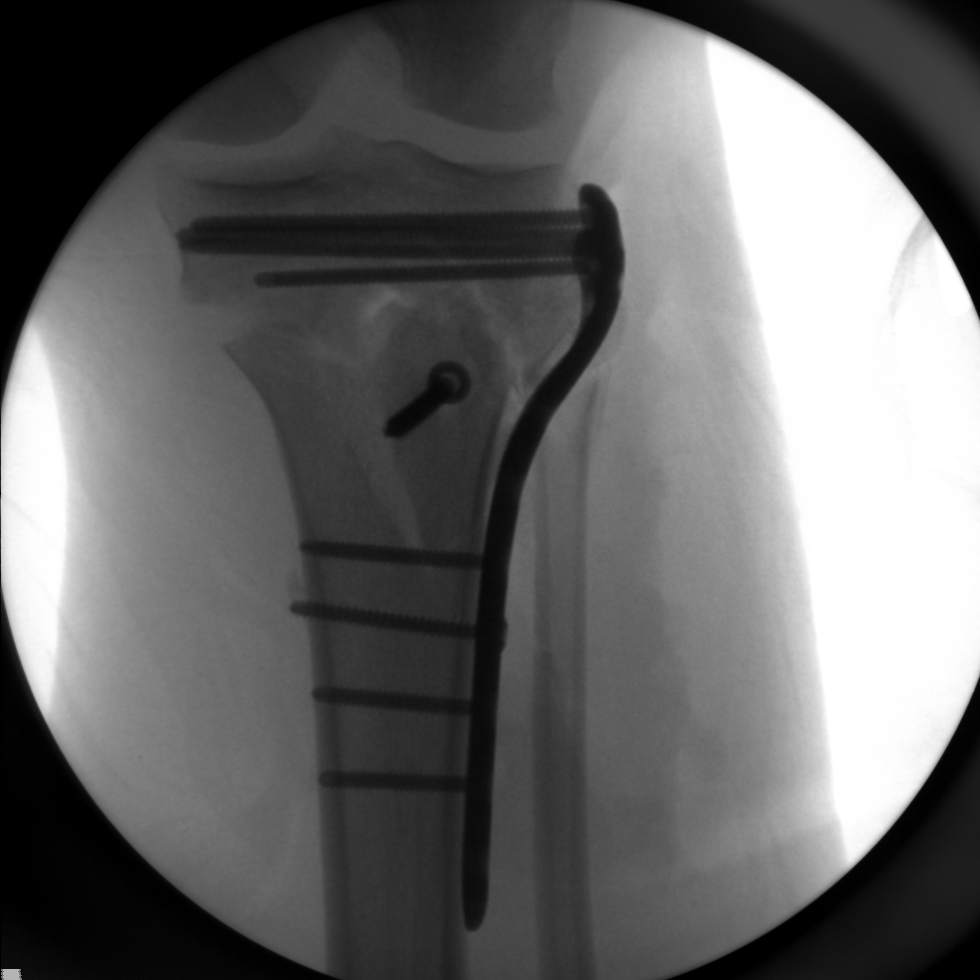

[2 of 2 positions shown; findings below may reference images not displayed]

FINDINGS: Plate and screw fixation across the proximal tibial fracture. No
hardware complicating feature. Near anatomic alignment.
IMPRESSION: Internal fixation across the proximal tibial fracture.

## 2014-11-08 IMAGING — RF DG C-ARM GT 120 MIN
1 series · 2 of 2 positions shown · non-contrast
Comparison: 08/11/2013

CLINICAL DATA: Humeral fracture, internal fixation

EXAM:
LEFT HUMERUS - 2+ VIEW; DG C-ARM GT 120 MIN

[Series 1: run · 2 of 2 slices shown]
[im 1/2]
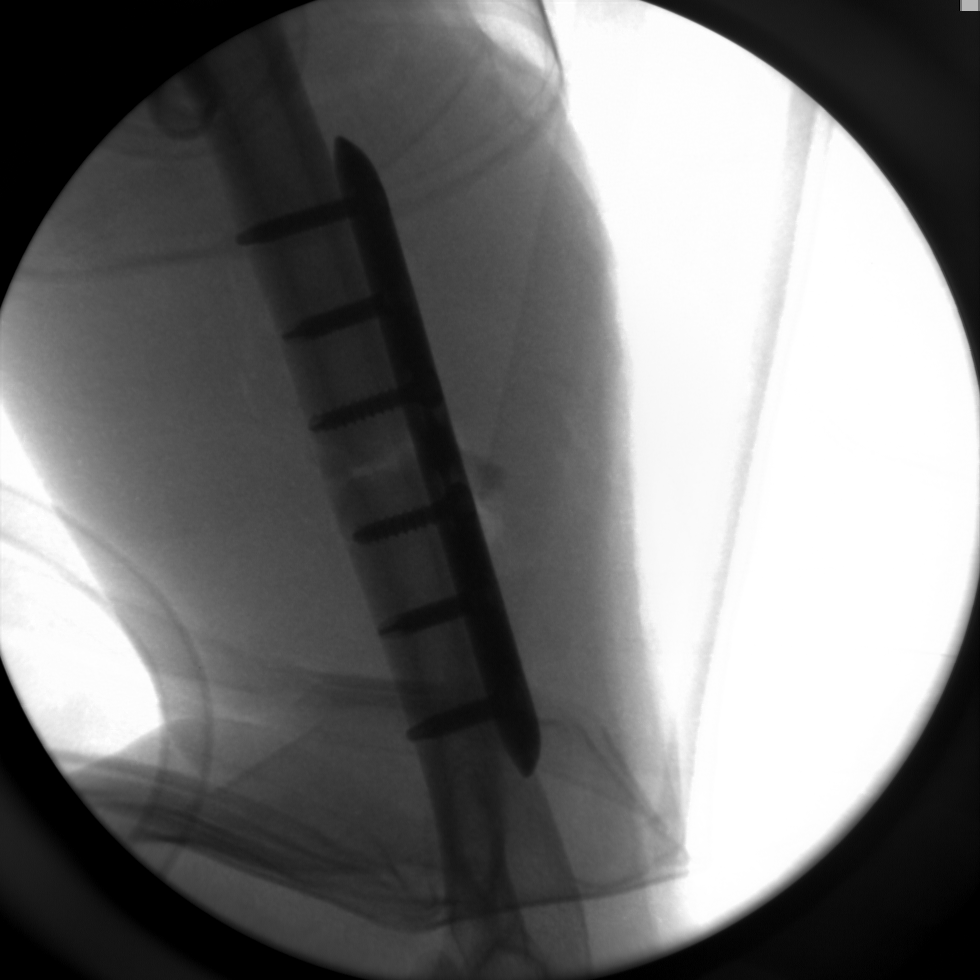
[im 2/2]
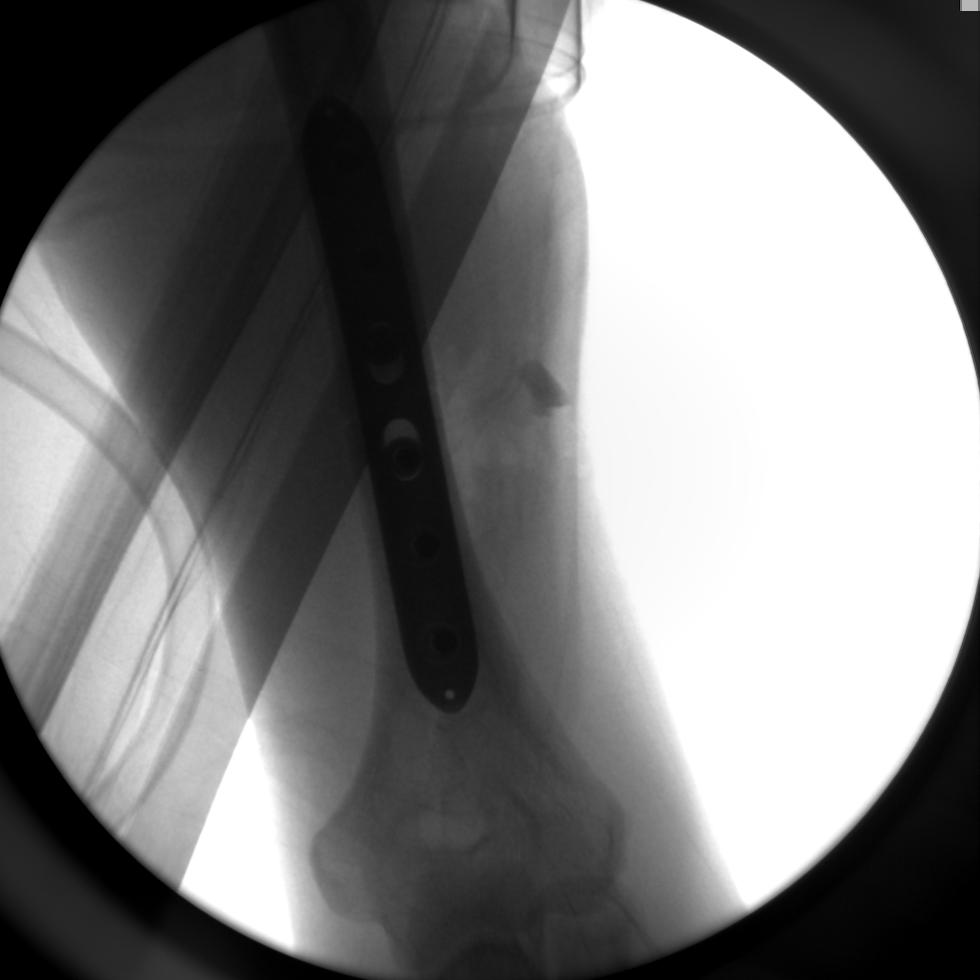

[2 of 2 positions shown; findings below may reference images not displayed]

FINDINGS: Plate and screw fixation across the transverse left humeral
fracture. Near anatomic alignment. No hardware or bony complicating
feature.
IMPRESSION: Internal fixation of the left humeral fracture.
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
## 2015-04-03 NOTE — Progress Notes (Signed)
Error

## 2018-03-09 ENCOUNTER — Ambulatory Visit (INDEPENDENT_AMBULATORY_CARE_PROVIDER_SITE_OTHER): Payer: Medicaid Other | Admitting: Family Medicine

## 2018-03-09 ENCOUNTER — Encounter: Payer: Self-pay | Admitting: Family Medicine

## 2018-03-09 ENCOUNTER — Other Ambulatory Visit: Payer: Self-pay | Admitting: Family Medicine

## 2018-03-09 ENCOUNTER — Other Ambulatory Visit: Payer: Self-pay

## 2018-03-09 VITALS — BP 120/80 | HR 78 | Temp 98.7°F | Ht 63.0 in | Wt 184.0 lb

## 2018-03-09 DIAGNOSIS — Z8639 Personal history of other endocrine, nutritional and metabolic disease: Secondary | ICD-10-CM

## 2018-03-09 DIAGNOSIS — S069X9S Unspecified intracranial injury with loss of consciousness of unspecified duration, sequela: Secondary | ICD-10-CM | POA: Diagnosis present

## 2018-03-09 DIAGNOSIS — F5101 Primary insomnia: Secondary | ICD-10-CM | POA: Diagnosis not present

## 2018-03-09 LAB — POCT GLYCOSYLATED HEMOGLOBIN (HGB A1C): Hemoglobin A1C: 5.7

## 2018-03-09 MED ORDER — MELATONIN 5 MG PO TABS: 5.0000 mg | ORAL_TABLET | Freq: Every day | ORAL | 11 refills | Status: AC
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
## 2018-03-09 NOTE — Assessment & Plan Note (Signed)
Filled out FL2 form, standing orders for group home, and scripts for DME equipment that patient's group home is requesting.

## 2018-03-09 NOTE — Patient Instructions (Signed)
It was nice meeting you today Mr. Mayeda!  For Mr. Galea sleep, I have prescribed melatonin to be taken before bed each night.  Please also discuss his sleep at his behavioral health appointment.  Seroquel could be an option that could work for him.  I have ordered his equipment.  Please call our clinic if there are any issues obtaining the equipment.  If you have any questions or concerns, please feel free to call the clinic.   Be well,  Dr. Frances Furbish

## 2018-03-09 NOTE — Assessment & Plan Note (Signed)
Suggested that caregivers speak with patient's behavioral health provider at his appointment on Friday regarding any changes they can make to his medications (possibly increasing Seroquel for example) that could aid in his sleep.  Also prescribed melatonin.

## 2018-03-10 ENCOUNTER — Telehealth: Payer: Self-pay

## 2018-03-10 NOTE — Telephone Encounter (Signed)
Barrington Ellison, caregiver, called with question regarding Melatonin 5 mg prescription.  Patient is already on this medication at this strength: are they to discontinue the other Melatonin, continue the med or add this medication for a total of 10 mg?  Mike's callback is [phone]  Ples Specter, RN [facility] RN)

## 2018-03-10 NOTE — Telephone Encounter (Signed)
Informed Mr. Cory [name] that we will stick with the 5 mg melatonin.  He is dropping off the paper scripts for [facility] tomorrow, since I was not shown them yesterday.  This will help me reconcile his medications on his chart with his current medications.  Melatonin was not listed as a medication in his new patient packet or on his chart, so other medications may be inaccurate as well.

## 2018-03-11 ENCOUNTER — Telehealth: Payer: Self-pay | Admitting: Family Medicine

## 2018-03-11 ENCOUNTER — Other Ambulatory Visit: Payer: Self-pay | Admitting: Family Medicine

## 2018-03-11 NOTE — Telephone Encounter (Signed)
Cory [name] dropped off copies of RX scripts for pcp. Placed in pcp box.

## 2018-03-15 ENCOUNTER — Other Ambulatory Visit: Payer: Self-pay | Admitting: Family Medicine

## 2018-03-17 ENCOUNTER — Telehealth: Payer: Self-pay | Admitting: Family Medicine

## 2018-03-17 NOTE — Telephone Encounter (Signed)
Pt's caregiver Nilda Riggs from [facility] called requesting the notes from [facility] last appointment 03/09/18. He had them printed while he was here and they were misplaced. He would like them faxed to him at [phone]. He also wanted to check the status of paper work that was faxed from [facility] to Dr Frances Furbish. The paper work was regarding his new wheelchair and recliner.

## 2018-03-18 NOTE — Telephone Encounter (Signed)
Patient's office visit note will be left up front for Mr. Cory [name] to pick up under the patient's name.  The forms for his wheelchair and recliner were filled out and faxed to [facility].  He should hopefully hear back from them soon, but he can call them if he would like an update.

## 2018-03-23 NOTE — Telephone Encounter (Signed)
Contacted pt caregiver and gave him the below information and he said he would probably come by to pick up tomorrow. Cory [name], Howard Patton D, [place]

## 2018-03-24 NOTE — Telephone Encounter (Signed)
Ext [id number]  [facility]:  Returned Dr Martyn Ehrich call. It is ok to cross out Dr Burr Medico information on the prescription and add her own.

## 2018-06-24 ENCOUNTER — Encounter: Payer: Self-pay | Admitting: Family Medicine

## 2018-06-24 ENCOUNTER — Ambulatory Visit: Payer: Medicaid Other | Admitting: Family Medicine

## 2018-06-24 ENCOUNTER — Other Ambulatory Visit: Payer: Self-pay

## 2018-06-24 VITALS — BP 118/60 | HR 96 | Temp 99.6°F | Ht 63.0 in | Wt 175.8 lb

## 2018-06-24 DIAGNOSIS — S069X9D Unspecified intracranial injury with loss of consciousness of unspecified duration, subsequent encounter: Secondary | ICD-10-CM | POA: Diagnosis not present

## 2018-06-24 DIAGNOSIS — S0990XS Unspecified injury of head, sequela: Secondary | ICD-10-CM

## 2018-06-24 DIAGNOSIS — R152 Fecal urgency: Secondary | ICD-10-CM

## 2018-06-24 DIAGNOSIS — S82141D Displaced bicondylar fracture of right tibia, subsequent encounter for closed fracture with routine healing: Secondary | ICD-10-CM | POA: Diagnosis present

## 2018-06-24 DIAGNOSIS — R159 Full incontinence of feces: Secondary | ICD-10-CM | POA: Diagnosis not present
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
## 2018-06-24 NOTE — Patient Instructions (Signed)
It was nice seeing you today Mr. Clearance CootsHarper!  For your incontinence supplies, I am giving you a paper prescription.  For your ankle brace, I am referring you to physical therapy for evaluation.  I am giving you a print out of your most recent CT Head result, which was done in 2015.  If you have any questions or concerns, please feel free to call the clinic.   Be well,  Dr. Frances FurbishWinfrey

## 2018-06-28 DIAGNOSIS — R152 Fecal urgency: Secondary | ICD-10-CM | POA: Insufficient documentation

## 2018-06-28 DIAGNOSIS — R159 Full incontinence of feces: Secondary | ICD-10-CM | POA: Insufficient documentation

## 2018-06-28 NOTE — Assessment & Plan Note (Signed)
Provided patient with previous neurologist's name at [facility] neurology.  Also printed out patient's CT head report from 2015.

## 2018-06-28 NOTE — Assessment & Plan Note (Signed)
Will refer to physical therapy for evaluation of current ankle brace.

## 2018-06-28 NOTE — Assessment & Plan Note (Signed)
This is related to patient's TBI.  Will give prescription for incontinence supplies and allow patient's caregiver to choose which supplies best suit the patient.

## 2018-07-06 ENCOUNTER — Ambulatory Visit: Payer: Medicaid Other | Attending: Family Medicine | Admitting: Physical Therapy

## 2018-07-06 ENCOUNTER — Telehealth: Payer: Self-pay | Admitting: Family Medicine

## 2018-07-06 DIAGNOSIS — R262 Difficulty in walking, not elsewhere classified: Secondary | ICD-10-CM

## 2018-07-06 NOTE — Therapy (Signed)
[facility] [address] [place], [place], [address] Phone: [phone]   Fax:  [phone]  Patient Details  Name: Cory [name] MRN: [id number] Date of Birth: 1981/02/05 Referring Provider:  Lennox Solders, MD  Encounter Date: 07/06/2018   Patient arrived for evaluation.  Caregiver reports he has difficulty walking with his current AFO and also has safety issues with his current 4 wheeled rolling walker.  There are more comfortable options for him but this clinic does not make those recommendations without input from an orthotist.  PT is not indicated but he would benefit from a visit with Orthotist to determine type and customization he needs. A DME agency referral could assist him getting a new one as well.    Left message for MD regarding these needs.  Gave caregiver this info as well.   Karie Mainland, PT 07/06/18 2:14 PM Phone: [phone] Fax: [phone]   PAA,[name] 07/06/2018, 2:07 PM  [facility] [address] [place], [place], [address] Phone: [phone]   Fax:  [phone]

## 2018-07-06 NOTE — Telephone Encounter (Signed)
Physical Therapist Karie Mainland from [facility] is calling to let Dr. Frances Furbish know pt does not need physical therapy but needs a referral for orthotist for his AFO for his leg. He also needs a new walker due to his being unsafe. The best number to contact Cory [name] is [phone].

## 2018-07-12 ENCOUNTER — Telehealth: Payer: Self-pay | Admitting: Family Medicine

## 2018-07-12 ENCOUNTER — Other Ambulatory Visit: Payer: Self-pay | Admitting: Family Medicine

## 2018-07-12 DIAGNOSIS — S069X9S Unspecified intracranial injury with loss of consciousness of unspecified duration, sequela: Secondary | ICD-10-CM

## 2018-07-12 NOTE — Telephone Encounter (Signed)
Called Outpatient Rehab office regarding how to refer Mr. Clearance CootsHarper to an orthotist, and they recommended that I contact either [employer] or [employer] to see how they can help him.  I also placed a DME Walker order and messaged Ms. Gust RungStenson and Mr. Jeanella Crazeierce with [facility] regarding the new order.

## 2018-07-12 NOTE — Telephone Encounter (Signed)
Called [name] to see how to get Mr. Cory [name] connected with them for an evaluation and likely replacement of his leg brace.  He is already in their system, so they will need a faxed script detailing what I would like for them to do for him, and they should be able to assess his brace and replace if needed.  I will fax script to them today (fax number is [phone]).

## 2018-07-15 ENCOUNTER — Encounter: Payer: Self-pay | Admitting: Family Medicine

## 2018-09-08 ENCOUNTER — Inpatient Hospital Stay (HOSPITAL_COMMUNITY)
Admission: EM | Admit: 2018-09-08 | Discharge: 2018-09-09 | DRG: 194 | Disposition: A | Discharge status: Home / Self Care | Payer: Medicaid Other | Attending: Family Medicine | Admitting: Family Medicine

## 2018-09-08 ENCOUNTER — Encounter (HOSPITAL_COMMUNITY): Payer: Self-pay | Admitting: Emergency Medicine

## 2018-09-08 ENCOUNTER — Emergency Department (HOSPITAL_COMMUNITY): Payer: Medicaid Other

## 2018-09-08 DIAGNOSIS — D649 Anemia, unspecified: Secondary | ICD-10-CM | POA: Diagnosis present

## 2018-09-08 DIAGNOSIS — Z881 Allergy status to other antibiotic agents status: Secondary | ICD-10-CM

## 2018-09-08 DIAGNOSIS — G894 Chronic pain syndrome: Secondary | ICD-10-CM | POA: Diagnosis present

## 2018-09-08 DIAGNOSIS — N390 Urinary tract infection, site not specified: Secondary | ICD-10-CM | POA: Diagnosis present

## 2018-09-08 DIAGNOSIS — G4700 Insomnia, unspecified: Secondary | ICD-10-CM | POA: Diagnosis present

## 2018-09-08 DIAGNOSIS — F17210 Nicotine dependence, cigarettes, uncomplicated: Secondary | ICD-10-CM | POA: Diagnosis present

## 2018-09-08 DIAGNOSIS — R561 Post traumatic seizures: Secondary | ICD-10-CM | POA: Diagnosis present

## 2018-09-08 DIAGNOSIS — Z86718 Personal history of other venous thrombosis and embolism: Secondary | ICD-10-CM

## 2018-09-08 DIAGNOSIS — R079 Chest pain, unspecified: Secondary | ICD-10-CM | POA: Diagnosis not present

## 2018-09-08 DIAGNOSIS — F411 Generalized anxiety disorder: Secondary | ICD-10-CM | POA: Diagnosis present

## 2018-09-08 DIAGNOSIS — F4310 Post-traumatic stress disorder, unspecified: Secondary | ICD-10-CM | POA: Diagnosis present

## 2018-09-08 DIAGNOSIS — R159 Full incontinence of feces: Secondary | ICD-10-CM | POA: Diagnosis present

## 2018-09-08 DIAGNOSIS — F319 Bipolar disorder, unspecified: Secondary | ICD-10-CM | POA: Diagnosis present

## 2018-09-08 DIAGNOSIS — R152 Fecal urgency: Secondary | ICD-10-CM | POA: Diagnosis present

## 2018-09-08 DIAGNOSIS — J189 Pneumonia, unspecified organism: Principal | ICD-10-CM | POA: Diagnosis present

## 2018-09-08 DIAGNOSIS — J181 Lobar pneumonia, unspecified organism: Secondary | ICD-10-CM

## 2018-09-08 DIAGNOSIS — Z87820 Personal history of traumatic brain injury: Secondary | ICD-10-CM | POA: Diagnosis not present

## 2018-09-08 DIAGNOSIS — Z888 Allergy status to other drugs, medicaments and biological substances status: Secondary | ICD-10-CM | POA: Diagnosis not present

## 2018-09-08 LAB — CBC WITH DIFFERENTIAL/PLATELET
BASOS ABS: 0.1 10*3/uL (ref 0.0–0.1)
BASOS PCT: 1 %
EOS ABS: 0.5 10*3/uL (ref 0.0–0.5)
Eosinophils Relative: 4 %
HCT: 39.5 % (ref 39.0–52.0)
Hemoglobin: 12.8 g/dL — ABNORMAL LOW (ref 13.0–17.0)
Lymphocytes Relative: 19 %
Lymphs Abs: 2.4 10*3/uL (ref 0.7–4.0)
MCH: 29.8 pg (ref 26.0–34.0)
MCHC: 32.4 g/dL (ref 30.0–36.0)
MCV: 91.9 fL (ref 80.0–100.0)
MONOS PCT: 24 %
Monocytes Absolute: 3 10*3/uL — ABNORMAL HIGH (ref 0.1–1.0)
NEUTROS ABS: 6.2 10*3/uL (ref 1.7–7.7)
NEUTROS PCT: 49 %
NRBC: 0 % (ref 0.0–0.2)
NRBC: 0 /100{WBCs}
OTHER: 3 %
PLATELETS: 182 10*3/uL (ref 150–400)
RBC: 4.3 MIL/uL (ref 4.22–5.81)
RDW: 13.2 % (ref 11.5–15.5)
WBC: 12.7 10*3/uL — ABNORMAL HIGH (ref 4.0–10.5)

## 2018-09-08 LAB — COMPREHENSIVE METABOLIC PANEL
ALBUMIN: 2.6 g/dL — AB (ref 3.5–5.0)
ALK PHOS: 67 U/L (ref 38–126)
ALT: 19 U/L (ref 0–44)
ANION GAP: 9 (ref 5–15)
AST: 18 U/L (ref 15–41)
BILIRUBIN TOTAL: 0.7 mg/dL (ref 0.3–1.2)
BUN: 9 mg/dL (ref 6–20)
CALCIUM: 8.6 mg/dL — AB (ref 8.9–10.3)
CO2: 27 mmol/L (ref 22–32)
Chloride: 101 mmol/L (ref 98–111)
Creatinine, Ser: 1.15 mg/dL (ref 0.61–1.24)
GLUCOSE: 96 mg/dL (ref 70–99)
Potassium: 4.4 mmol/L (ref 3.5–5.1)
SODIUM: 137 mmol/L (ref 135–145)
TOTAL PROTEIN: 7.3 g/dL (ref 6.5–8.1)

## 2018-09-08 LAB — I-STAT TROPONIN, ED: Troponin i, poc: 0 ng/mL (ref 0.00–0.08)

## 2018-09-08 LAB — URINALYSIS, ROUTINE W REFLEX MICROSCOPIC
BILIRUBIN URINE: NEGATIVE
Glucose, UA: NEGATIVE mg/dL
Hgb urine dipstick: NEGATIVE
KETONES UR: 5 mg/dL — AB
Leukocytes, UA: NEGATIVE
NITRITE: NEGATIVE
PH: 7 (ref 5.0–8.0)
Protein, ur: NEGATIVE mg/dL
Specific Gravity, Urine: 1.011 (ref 1.005–1.030)

## 2018-09-08 LAB — LIPASE, BLOOD: Lipase: 29 U/L (ref 11–51)

## 2018-09-08 MED ORDER — SODIUM CHLORIDE 0.9 % IV SOLN
1.0000 g | Freq: Once | INTRAVENOUS | Status: AC
  Administered 2018-09-08: 1 g via INTRAVENOUS
  Filled 2018-09-08: qty 10

## 2018-09-08 MED ORDER — SODIUM CHLORIDE 0.9 % IV SOLN
500.0000 mg | Freq: Once | INTRAVENOUS | Status: AC
  Administered 2018-09-08: 500 mg via INTRAVENOUS
  Filled 2018-09-08: qty 500

## 2018-09-08 MED ORDER — ONDANSETRON HCL 4 MG/2ML IJ SOLN
4.0000 mg | Freq: Once | INTRAMUSCULAR | Status: AC
  Administered 2018-09-08: 4 mg via INTRAVENOUS
  Filled 2018-09-08: qty 2

## 2018-09-08 MED ORDER — FAMOTIDINE IN NACL 20-0.9 MG/50ML-% IV SOLN
20.0000 mg | Freq: Once | INTRAVENOUS | Status: AC
  Administered 2018-09-08: 20 mg via INTRAVENOUS
  Filled 2018-09-08: qty 50

## 2018-09-08 MED ORDER — MORPHINE SULFATE (PF) 4 MG/ML IV SOLN
4.0000 mg | Freq: Once | INTRAVENOUS | Status: AC
  Administered 2018-09-08: 4 mg via INTRAVENOUS
  Filled 2018-09-08: qty 1

## 2018-09-08 NOTE — ED Triage Notes (Signed)
Pt presents with abd pain for unknown period of time, states "its a secret"; also asked if he was having n/v/d, replied "all of it"; poor historian
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
## 2018-09-08 NOTE — ED Notes (Signed)
Admitting MD notified on pt.'s refusing IV venipuncture .
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
## 2018-09-09 ENCOUNTER — Other Ambulatory Visit: Payer: Self-pay

## 2018-09-09 LAB — HEMOGLOBIN A1C
Hgb A1c MFr Bld: 5.6 % (ref 4.8–5.6)
MEAN PLASMA GLUCOSE: 114.02 mg/dL

## 2018-09-09 LAB — CBC
HEMATOCRIT: 36.2 % — AB (ref 39.0–52.0)
HEMOGLOBIN: 12.3 g/dL — AB (ref 13.0–17.0)
MCH: 30.8 pg (ref 26.0–34.0)
MCHC: 34 g/dL (ref 30.0–36.0)
MCV: 90.5 fL (ref 80.0–100.0)
NRBC: 0 % (ref 0.0–0.2)
Platelets: 154 10*3/uL (ref 150–400)
RBC: 4 MIL/uL — ABNORMAL LOW (ref 4.22–5.81)
RDW: 13.2 % (ref 11.5–15.5)
WBC: 12.1 10*3/uL — AB (ref 4.0–10.5)

## 2018-09-09 LAB — BASIC METABOLIC PANEL
Anion gap: 13 (ref 5–15)
BUN: 9 mg/dL (ref 6–20)
CHLORIDE: 102 mmol/L (ref 98–111)
CO2: 23 mmol/L (ref 22–32)
Calcium: 8.2 mg/dL — ABNORMAL LOW (ref 8.9–10.3)
Creatinine, Ser: 1.2 mg/dL (ref 0.61–1.24)
Glucose, Bld: 74 mg/dL (ref 70–99)
POTASSIUM: 4.3 mmol/L (ref 3.5–5.1)
SODIUM: 138 mmol/L (ref 135–145)

## 2018-09-09 LAB — TROPONIN I: Troponin I: <0.03 ng/mL (ref <0.03)

## 2018-09-09 LAB — MRSA PCR SCREENING: MRSA by PCR: NEGATIVE

## 2018-09-09 LAB — HIV ANTIBODY (ROUTINE TESTING W REFLEX): HIV Screen 4th Generation wRfx: NONREACTIVE

## 2018-09-09 MED ORDER — AMANTADINE HCL 100 MG PO CAPS: 100.0000 mg | ORAL_CAPSULE | ORAL | Status: DC

## 2018-09-09 MED ORDER — PANTOPRAZOLE SODIUM 40 MG PO TBEC
40.0000 mg | DELAYED_RELEASE_TABLET | Freq: Every day | ORAL | Status: DC
  Administered 2018-09-09: 40 mg via ORAL
  Filled 2018-09-09: qty 1

## 2018-09-09 MED ORDER — SODIUM CHLORIDE 0.9 % IV SOLN: 1.0000 g | INTRAVENOUS | Status: DC

## 2018-09-09 MED ORDER — ACETAMINOPHEN 650 MG RE SUPP: 650.0000 mg | Freq: Four times a day (QID) | RECTAL | Status: DC | PRN

## 2018-09-09 MED ORDER — CEFDINIR 300 MG PO CAPS: 300.0000 mg | ORAL_CAPSULE | Freq: Two times a day (BID) | ORAL | Status: DC

## 2018-09-09 MED ORDER — HYDROXYZINE HCL 25 MG PO TABS
25.0000 mg | ORAL_TABLET | Freq: Two times a day (BID) | ORAL | Status: DC
  Administered 2018-09-09 (×2): 25 mg via ORAL
  Filled 2018-09-09 (×2): qty 1

## 2018-09-09 MED ORDER — OLANZAPINE 7.5 MG PO TABS
15.0000 mg | ORAL_TABLET | Freq: Every day | ORAL | Status: DC
  Administered 2018-09-09: 15 mg via ORAL
  Filled 2018-09-09: qty 2

## 2018-09-09 MED ORDER — GABAPENTIN 300 MG PO CAPS
300.0000 mg | ORAL_CAPSULE | Freq: Two times a day (BID) | ORAL | Status: DC
  Administered 2018-09-09 (×2): 300 mg via ORAL
  Filled 2018-09-09 (×2): qty 1

## 2018-09-09 MED ORDER — ACETAMINOPHEN 325 MG PO TABS: 650.0000 mg | ORAL_TABLET | Freq: Four times a day (QID) | ORAL | Status: DC | PRN

## 2018-09-09 MED ORDER — VITAMIN D 25 MCG (1000 UNIT) PO TABS
1000.0000 [IU] | ORAL_TABLET | Freq: Every day | ORAL | Status: DC
  Administered 2018-09-09: 1000 [IU] via ORAL
  Filled 2018-09-09 (×2): qty 1

## 2018-09-09 MED ORDER — ENOXAPARIN SODIUM 40 MG/0.4ML ~~LOC~~ SOLN
40.0000 mg | SUBCUTANEOUS | Status: DC
  Administered 2018-09-09: 40 mg via SUBCUTANEOUS
  Filled 2018-09-09: qty 0.4

## 2018-09-09 MED ORDER — SODIUM CHLORIDE 0.9 % IV SOLN: 500.0000 mg | INTRAVENOUS | Status: DC

## 2018-09-09 MED ORDER — SODIUM CHLORIDE 0.9 % IV SOLN
500.0000 mg | Freq: Once | INTRAVENOUS | Status: AC
  Administered 2018-09-09: 500 mg via INTRAVENOUS
  Filled 2018-09-09: qty 500

## 2018-09-09 MED ORDER — TRAZODONE HCL 50 MG PO TABS
100.0000 mg | ORAL_TABLET | Freq: Every day | ORAL | Status: DC
  Administered 2018-09-09: 100 mg via ORAL
  Filled 2018-09-09: qty 2

## 2018-09-09 MED ORDER — CALCIUM CITRATE 950 (200 CA) MG PO TABS
2850.0000 mg | ORAL_TABLET | Freq: Two times a day (BID) | ORAL | Status: DC
  Administered 2018-09-09 (×2): 2850 mg via ORAL
  Filled 2018-09-09 (×2): qty 3

## 2018-09-09 MED ORDER — AZITHROMYCIN 250 MG PO TABS: 250.0000 mg | ORAL_TABLET | Freq: Every day | ORAL | 0 refills | Status: AC

## 2018-09-09 MED ORDER — SODIUM CHLORIDE 0.9 % IV SOLN
1.0000 g | Freq: Once | INTRAVENOUS | Status: AC
  Administered 2018-09-09: 1 g via INTRAVENOUS
  Filled 2018-09-09: qty 10

## 2018-09-09 MED ORDER — POLYETHYLENE GLYCOL 3350 17 G PO PACK
17.0000 g | PACK | Freq: Every day | ORAL | Status: DC
  Administered 2018-09-09: 17 g via ORAL
  Filled 2018-09-09: qty 1

## 2018-09-09 MED ORDER — MELATONIN 3 MG PO TABS
6.0000 mg | ORAL_TABLET | Freq: Every day | ORAL | Status: DC
  Administered 2018-09-09: 6 mg via ORAL
  Filled 2018-09-09: qty 2

## 2018-09-09 MED ORDER — CEFDINIR 300 MG PO CAPS: 300.0000 mg | ORAL_CAPSULE | Freq: Two times a day (BID) | ORAL | 0 refills | Status: AC

## 2018-09-09 MED ORDER — AMANTADINE HCL 100 MG PO CAPS
200.0000 mg | ORAL_CAPSULE | ORAL | Status: DC
  Administered 2018-09-09: 200 mg via ORAL
  Filled 2018-09-09: qty 2

## 2018-09-09 MED ORDER — INFLUENZA VAC SPLIT QUAD 0.5 ML IM SUSY: 0.5000 mL | PREFILLED_SYRINGE | INTRAMUSCULAR | Status: DC

## 2018-09-09 MED ORDER — DIVALPROEX SODIUM 250 MG PO DR TAB
1000.0000 mg | DELAYED_RELEASE_TABLET | Freq: Two times a day (BID) | ORAL | Status: DC
  Administered 2018-09-09 (×2): 1000 mg via ORAL
  Filled 2018-09-09 (×2): qty 4

## 2018-09-09 MED ORDER — NICOTINE 7 MG/24HR TD PT24
7.0000 mg | MEDICATED_PATCH | Freq: Every day | TRANSDERMAL | Status: DC
  Administered 2018-09-09: 7 mg via TRANSDERMAL
  Filled 2018-09-09: qty 1

## 2018-09-09 MED ORDER — AZITHROMYCIN 500 MG PO TABS: 250.0000 mg | ORAL_TABLET | Freq: Every day | ORAL | Status: DC

## 2018-09-09 NOTE — Discharge Instructions (Signed)
Cory [name] was admitted for community-acquired pneumonia.  He will need to continue taking Cefdinir and Azithromycin for 6 more days to complete his antibiotic course.  He should take Cefdinir  twice daily and azithromycin once daily.  He has an appointment to follow-up on his pneumonia at our clinic with Dr. Leveda AnnaHensel, who saw him in the hospital.  Please go to this appointment or call our office if you need to change this appointment.

## 2018-09-09 NOTE — Progress Notes (Signed)
Nsg Discharge Note  Admit Date:  09/08/2018 Discharge date: 09/09/2018   Cory [name] to be D/C'd group home per MD order.  AVS completed.  Copy for chart, and copy for patient signed, and dated. Caregiver able to verbalize understanding.  Discharge Medication: Allergies as of 09/09/2018      Reactions   Metformin And Related    Vancomycin       Medication List    TAKE these medications   amantadine 100 MG capsule Commonly known as:  SYMMETREL Take 100-200 mg by mouth See admin instructions. 200 mg in the morning at 8am and 100 mg at 2pm   azithromycin 250 MG tablet Commonly known as:  ZITHROMAX Take 1 tablet (250 mg total) by mouth daily. Start taking on:  09/10/2018   CALCITRATE 950 MG tablet Generic drug:  calcium citrate TAKE 3 TABLETS BY MOUTH TWICE DAILY AT 8AM & 5PM What changed:  See the new instructions.   cefdinir 300 MG capsule Commonly known as:  OMNICEF Take 1 capsule (300 mg total) by mouth every 12 (twelve) hours for 6 days.   cholecalciferol 25 MCG (1000 UT) tablet Commonly known as:  VITAMIN D TAKE (1) TABLET BY MOUTH ONCE DAILY. What changed:  See the new instructions.   divalproex 500 MG DR tablet Commonly known as:  DEPAKOTE Take 1 tablet (500 mg total) by mouth every 12 (twelve) hours. What changed:    how much to take  when to take this   escitalopram 10 MG tablet Commonly known as:  LEXAPRO Take 1 tablet (10 mg total) by mouth daily.   esomeprazole 40 MG capsule Commonly known as:  NEXIUM TAKE 1 CAPSULE BY MOUTH ONCE DAILY AT 8AM What changed:  See the new instructions.   gabapentin 300 MG capsule Commonly known as:  NEURONTIN Take 300 mg by mouth 2 (two) times daily.   GAVILAX powder Generic drug:  polyethylene glycol powder MIX 17 GRAMS WITH 4-8 OUNCES OF FLUID AND TAKE BY MOUTH DAILY AT 8AM What changed:  See the new instructions.   senna 8.6 MG Tabs tablet Commonly known as:  SENOKOT Take 2 tablets by mouth daily.    GERI-KOT 8.6 MG tablet Generic drug:  senna TAKE 2 TABLETS BY MOUTH ONCE DAILY AT 8AM   hydrOXYzine 25 MG tablet Commonly known as:  ATARAX/VISTARIL Take 25 mg by mouth 2 (two) times daily.   LORazepam 1 MG tablet Commonly known as:  ATIVAN Take 1 tablet (1 mg total) by mouth every 8 (eight) hours as needed for anxiety (agitation).   Melatonin 5 MG Tabs Take 1 tablet (5 mg total) by mouth at bedtime.   multivitamin-iron-minerals-folic acid chewable tablet Chew 1 tablet by mouth daily.   NON-ASPIRIN PAIN RELIEF 325 MG tablet Generic drug:  acetaminophen TAKE 2 TABLETS (650MG ) BY MOUTH EVERY 6 HOURS AS NEEDED FOR PAIN   OLANZapine 15 MG tablet Commonly known as:  ZYPREXA Take 15 mg by mouth at bedtime.   QUEtiapine 50 MG tablet Commonly known as:  SEROQUEL Take 1 tablet (50 mg total) by mouth 4 (four) times daily.   traZODone 100 MG tablet Commonly known as:  DESYREL Take 1 tablet (100 mg total) by mouth at bedtime.       Discharge Assessment: Vitals:   09/09/18 0100 09/09/18 1252  BP:  130/86  Pulse:  92  Resp:  18  Temp:  98.9 F (37.2 C)  SpO2: 94% 95%  Skin clean, dry and intact without  evidence of skin break down, no evidence of skin tears noted. IV catheter discontinued intact. Site without signs and symptoms of complications - no redness or edema noted at insertion site, patient denies c/o pain - only slight tenderness at site.  Dressing with slight pressure applied.  D/c Instructions-Education: Discharge instructions given to patient/family with verbalized understanding. D/c education completed with Group home rep including follow up instructions, medication list, d/c activities limitations if indicated, with other d/c instructions as indicated by MD - Caregivers able to verbalize understanding, all questions fully answered. Instructed to return to ED, call 911, or call MD for any changes in condition.  Patient escorted via WC, and D/C home via Group Home  transportation.  Cory N Tarren Sabree, RN 09/09/2018 2:23 PM

## 2018-09-09 NOTE — Discharge Summary (Signed)
Family Medicine [facility] Discharge Summary  Patient name: Cory [name] Medical record number: [id number] Date of birth: 1981/04/28 Age: 37 y.o. 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  Actively sitting in bed eating breakfast Eyes: EOM are normal.  Cardiovascular: Normal rate, regular rhythm and intact distal pulses.  Pulmonary/Chest: Effort normal, transmitted upper airway sounds, rhonchi in bilateral bases Abdominal: Soft. Normal appearance and bowel sounds are normal.  Neurological: He is alert.  Skin: Skin is warm. Capillary refill takes less than 2 seconds.  Psychiatric:  Occasionally excitable, but redirectable.   Can follow directions.  Brief Hospital Course:  Cory [name] presented on 11/14 for chest pain and shortness of breath.  EKG was within normal limits and troponin was within normal limits, but chest x-ray in the emergency room showed a bilateral lobar pneumonia.  Patient was started on IV azithromycin and ceftriaxone.  He removed his IV line due to confusion, but a new IV line was started and he was able to receive this medication.  He did not require oxygen during his hospitalization.  His vital signs were within normal limits, and he remained afebrile.  On 11/15, because of his improving shortness of breath, patient was transitioned to oral azithromycin and cefdinir.  Since being in the hospital was a stressful  experience for Cory [name] due to his TBI, he was discharged on 11/15 with a plan to complete a 7-day total course of oral antibiotics.  Issues for Follow Up:  1. Ensure that patient's respiratory status continues to improve and that he finishes his entire course of cefdinir and azithromycin. 2. It was thought that patient's chest pain was connected to his pneumonia, but this can be reassessed at his follow-up visit.  Significant Procedures: None  Significant Labs and Imaging:  Recent Labs  Lab 09/08/18 1940 09/09/18 0227  WBC 12.7* 12.1*  HGB 12.8* 12.3*  HCT 39.5 36.2*  PLT 182 154   Recent Labs  Lab 09/08/18 1940 09/09/18 0227  NA 137 138  K 4.4 4.3  CL 101 102  CO2 27 23  GLUCOSE 96 74  BUN 9 9  CREATININE 1.15 1.20  CALCIUM 8.6* 8.2*  ALKPHOS 67  --   AST 18  --   ALT 19  --   ALBUMIN 2.6*  --     Dg Chest 2 View  Result Date: 09/08/2018 CLINICAL DATA:  History of traumatic brain injury with wheeze and dyspnea. EXAM: CHEST - 2 VIEW COMPARISON:  09/15/2013 FINDINGS: Top-normal heart size. Nonaneurysmal thoracic aorta. Low lung volumes with patchy airspace opacities at each lung base, right middle lobe and lingula. Suggestion of air bronchograms at the left lung base. Findings would suggest multilobar pneumonia. No effusion or pneumothorax. IMPRESSION: Findings compatible with multilobar pneumonia. Electronically Signed   By: Tollie Ethavid  Kwon M.D.   On: 09/08/2018 21:51     Results/Tests Pending at Time of Discharge: none  Discharge Medications:  Allergies as of 09/09/2018      Reactions   Metformin And Related    Vancomycin  Medication List    TAKE these medications   amantadine 100 MG capsule Commonly known as:  SYMMETREL Take 100-200 mg by mouth See admin instructions. 200 mg in the morning at 8am and 100 mg at 2pm   azithromycin 250 MG tablet Commonly known as:  ZITHROMAX Take 1 tablet (250 mg total) by mouth daily. Start taking on:  09/10/2018    CALCITRATE 950 MG tablet Generic drug:  calcium citrate TAKE 3 TABLETS BY MOUTH TWICE DAILY AT 8AM & 5PM What changed:  See the new instructions.   cefdinir 300 MG capsule Commonly known as:  OMNICEF Take 1 capsule (300 mg total) by mouth every 12 (twelve) hours for 6 days.   cholecalciferol 25 MCG (1000 UT) tablet Commonly known as:  VITAMIN D TAKE (1) TABLET BY MOUTH ONCE DAILY. What changed:  See the new instructions.   divalproex 500 MG DR tablet Commonly known as:  DEPAKOTE Take 1 tablet (500 mg total) by mouth every 12 (twelve) hours. What changed:    how much to take  when to take this   escitalopram 10 MG tablet Commonly known as:  LEXAPRO Take 1 tablet (10 mg total) by mouth daily.   esomeprazole 40 MG capsule Commonly known as:  NEXIUM TAKE 1 CAPSULE BY MOUTH ONCE DAILY AT 8AM What changed:  See the new instructions.   gabapentin 300 MG capsule Commonly known as:  NEURONTIN Take 300 mg by mouth 2 (two) times daily.   GAVILAX powder Generic drug:  polyethylene glycol powder MIX 17 GRAMS WITH 4-8 OUNCES OF FLUID AND TAKE BY MOUTH DAILY AT 8AM What changed:  See the new instructions.   senna 8.6 MG Tabs tablet Commonly known as:  SENOKOT Take 2 tablets by mouth daily.   GERI-KOT 8.6 MG tablet Generic drug:  senna TAKE 2 TABLETS BY MOUTH ONCE DAILY AT 8AM   hydrOXYzine 25 MG tablet Commonly known as:  ATARAX/VISTARIL Take 25 mg by mouth 2 (two) times daily.   LORazepam 1 MG tablet Commonly known as:  ATIVAN Take 1 tablet (1 mg total) by mouth every 8 (eight) hours as needed for anxiety (agitation).   Melatonin 5 MG Tabs Take 1 tablet (5 mg total) by mouth at bedtime.   multivitamin-iron-minerals-folic acid chewable tablet Chew 1 tablet by mouth daily.   NON-ASPIRIN PAIN RELIEF 325 MG tablet Generic drug:  acetaminophen TAKE 2 TABLETS (650MG ) BY MOUTH EVERY 6 HOURS AS NEEDED FOR PAIN   OLANZapine 15 MG tablet Commonly known as:  ZYPREXA Take  15 mg by mouth at bedtime.   QUEtiapine 50 MG tablet Commonly known as:  SEROQUEL Take 1 tablet (50 mg total) by mouth 4 (four) times daily.   traZODone 100 MG tablet Commonly known as:  DESYREL Take 1 tablet (100 mg total) by mouth at bedtime.       Discharge Instructions: Please refer to Patient Instructions section of EMR for full details.  Patient was counseled important signs and symptoms that should prompt return to medical care, changes in medications, dietary instructions, activity restrictions, and follow up appointments.   Follow-Up Appointments: Follow-up Information    Hensel, Santiago Bumpers, MD. Go on 09/14/2018.   Specialty:  Family Medicine Why:  Please go to appointment at 2:45PM. Contact information: [address] [place] [address] [phone]           Lennox Solders, MD 09/09/2018, 11:47 AM PGY-2, [facility]

## 2018-09-14 ENCOUNTER — Telehealth: Payer: Self-pay | Admitting: Family Medicine

## 2018-09-14 ENCOUNTER — Inpatient Hospital Stay: Payer: Medicaid Other | Admitting: Family Medicine

## 2018-09-14 NOTE — Telephone Encounter (Signed)
I spoke with Dr. Tiburcio PeaHarris regarding Cory [name]'s death.  According to Dr. Tiburcio PeaHarris, Cory [name] was walking back to his room at his group home when he collapsed and was suddenly unresponsive.  He was unable to be resuscitated.  It was concluded that he had a sudden cardiac arrest of unknown cause.  Dr. Tiburcio PeaHarris says that he did not have worsening shortness of breath, cough, or hypoxemia beforehand, so it is unlikely that this is related to his pneumonia.  He wanted to ask my medical opinion if there were any other medical problems that could have caused this other than his possible TBI.  I told him that his TBI could have played a role since he has impaired coordination which could lead to possible aspiration, but I did not have any other medical issue that could have contributed to this.  It is unknown whether Cory [name] will have an autopsy.

## 2018-09-14 NOTE — Telephone Encounter (Signed)
[name] Dr. Frances FurbishWinfrey that this pt is diseased. Please call [name] back as soon as possible at [phone].

## 2018-09-25 DEATH — deceased

## 2018-09-26 ENCOUNTER — Other Ambulatory Visit: Payer: Self-pay | Admitting: Family Medicine
